# Patient Record
Sex: Female | Born: 1945 | Race: White | Hispanic: No | Marital: Married | Smoking: Never smoker
Health system: Southern US, Community
[De-identification: ages and names within clinical notes are randomized; demographics above are authoritative.]

## PROBLEM LIST (undated history)

## (undated) DIAGNOSIS — E785 Hyperlipidemia, unspecified: Secondary | ICD-10-CM

## (undated) DIAGNOSIS — H269 Unspecified cataract: Secondary | ICD-10-CM

## (undated) DIAGNOSIS — Z8719 Personal history of other diseases of the digestive system: Secondary | ICD-10-CM

## (undated) DIAGNOSIS — C801 Malignant (primary) neoplasm, unspecified: Secondary | ICD-10-CM

## (undated) DIAGNOSIS — K589 Irritable bowel syndrome without diarrhea: Secondary | ICD-10-CM

## (undated) DIAGNOSIS — K5790 Diverticulosis of intestine, part unspecified, without perforation or abscess without bleeding: Secondary | ICD-10-CM

## (undated) DIAGNOSIS — K5900 Constipation, unspecified: Secondary | ICD-10-CM

## (undated) DIAGNOSIS — I10 Essential (primary) hypertension: Secondary | ICD-10-CM

## (undated) DIAGNOSIS — K219 Gastro-esophageal reflux disease without esophagitis: Secondary | ICD-10-CM

## (undated) DIAGNOSIS — Z9289 Personal history of other medical treatment: Secondary | ICD-10-CM

## (undated) DIAGNOSIS — T7840XA Allergy, unspecified, initial encounter: Secondary | ICD-10-CM

## (undated) DIAGNOSIS — C4490 Unspecified malignant neoplasm of skin, unspecified: Secondary | ICD-10-CM

## (undated) DIAGNOSIS — M1990 Unspecified osteoarthritis, unspecified site: Secondary | ICD-10-CM

## (undated) DIAGNOSIS — E039 Hypothyroidism, unspecified: Secondary | ICD-10-CM

## (undated) DIAGNOSIS — R011 Cardiac murmur, unspecified: Secondary | ICD-10-CM

## (undated) DIAGNOSIS — N3010 Interstitial cystitis (chronic) without hematuria: Secondary | ICD-10-CM

## (undated) HISTORY — DX: Personal history of other diseases of the digestive system: Z87.19

## (undated) HISTORY — DX: Constipation, unspecified: K59.00

## (undated) HISTORY — PX: MOHS SURGERY: SUR867

## (undated) HISTORY — DX: Unspecified malignant neoplasm of skin, unspecified: C44.90

## (undated) HISTORY — DX: Unspecified osteoarthritis, unspecified site: M19.90

## (undated) HISTORY — DX: Malignant (primary) neoplasm, unspecified: C80.1

## (undated) HISTORY — DX: Interstitial cystitis (chronic) without hematuria: N30.10

## (undated) HISTORY — DX: Allergy, unspecified, initial encounter: T78.40XA

## (undated) HISTORY — DX: Hypothyroidism, unspecified: E03.9

## (undated) HISTORY — DX: Essential (primary) hypertension: I10

## (undated) HISTORY — DX: Gastro-esophageal reflux disease without esophagitis: K21.9

## (undated) HISTORY — DX: Diverticulosis of intestine, part unspecified, without perforation or abscess without bleeding: K57.90

## (undated) HISTORY — DX: Irritable bowel syndrome, unspecified: K58.9

## (undated) HISTORY — DX: Hyperlipidemia, unspecified: E78.5

## (undated) HISTORY — PX: JOINT REPLACEMENT: SHX530

## (undated) HISTORY — DX: Personal history of other medical treatment: Z92.89

## (undated) HISTORY — PX: ABDOMINAL HYSTERECTOMY: SHX81

## (undated) HISTORY — DX: Unspecified cataract: H26.9

---
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
## 1988-08-02 HISTORY — PX: TOTAL ABDOMINAL HYSTERECTOMY W/ BILATERAL SALPINGOOPHORECTOMY: SHX83

## 2000-05-09 ENCOUNTER — Encounter: Admission: RE | Admit: 2000-05-09 | Discharge: 2000-05-09 | Payer: Self-pay | Admitting: Family Medicine

## 2000-05-09 ENCOUNTER — Encounter: Payer: Self-pay | Admitting: Family Medicine

## 2000-08-17 ENCOUNTER — Other Ambulatory Visit: Admission: RE | Admit: 2000-08-17 | Discharge: 2000-08-17 | Payer: Self-pay | Admitting: Family Medicine

## 2001-05-16 ENCOUNTER — Encounter: Admission: RE | Admit: 2001-05-16 | Discharge: 2001-05-16 | Payer: Self-pay | Admitting: Family Medicine

## 2001-05-16 ENCOUNTER — Encounter: Payer: Self-pay | Admitting: Family Medicine

## 2002-05-21 ENCOUNTER — Encounter: Payer: Self-pay | Admitting: Family Medicine

## 2002-05-21 ENCOUNTER — Encounter: Admission: RE | Admit: 2002-05-21 | Discharge: 2002-05-21 | Payer: Self-pay | Admitting: Family Medicine

## 2003-02-08 ENCOUNTER — Other Ambulatory Visit: Admission: RE | Admit: 2003-02-08 | Discharge: 2003-02-08 | Payer: Self-pay | Admitting: Family Medicine

## 2003-05-28 ENCOUNTER — Encounter: Admission: RE | Admit: 2003-05-28 | Discharge: 2003-05-28 | Payer: Self-pay | Admitting: Family Medicine

## 2003-08-03 HISTORY — PX: MOHS SURGERY: SUR867

## 2004-05-28 ENCOUNTER — Encounter: Admission: RE | Admit: 2004-05-28 | Discharge: 2004-05-28 | Payer: Self-pay | Admitting: Family Medicine

## 2005-07-05 ENCOUNTER — Encounter: Admission: RE | Admit: 2005-07-05 | Discharge: 2005-07-05 | Payer: Self-pay | Admitting: Family Medicine

## 2006-07-07 ENCOUNTER — Encounter: Admission: RE | Admit: 2006-07-07 | Discharge: 2006-07-07 | Payer: Self-pay | Admitting: Family Medicine

## 2006-08-02 DIAGNOSIS — Z8711 Personal history of peptic ulcer disease: Secondary | ICD-10-CM

## 2006-08-02 HISTORY — DX: Personal history of peptic ulcer disease: Z87.11

## 2007-07-10 ENCOUNTER — Encounter: Admission: RE | Admit: 2007-07-10 | Discharge: 2007-07-10 | Payer: Self-pay | Admitting: Family Medicine

## 2008-05-25 ENCOUNTER — Emergency Department (HOSPITAL_COMMUNITY): Admission: EM | Admit: 2008-05-25 | Discharge: 2008-05-25 | Payer: Self-pay | Admitting: Emergency Medicine

## 2008-08-06 ENCOUNTER — Encounter: Admission: RE | Admit: 2008-08-06 | Discharge: 2008-11-04 | Payer: Self-pay | Admitting: Orthopedic Surgery

## 2008-08-14 ENCOUNTER — Encounter: Admission: RE | Admit: 2008-08-14 | Discharge: 2008-08-14 | Payer: Self-pay | Admitting: Family Medicine

## 2009-01-30 DIAGNOSIS — Z9289 Personal history of other medical treatment: Secondary | ICD-10-CM

## 2009-01-30 HISTORY — DX: Personal history of other medical treatment: Z92.89

## 2009-08-20 ENCOUNTER — Encounter: Admission: RE | Admit: 2009-08-20 | Discharge: 2009-08-20 | Payer: Self-pay | Admitting: Family Medicine

## 2010-08-22 ENCOUNTER — Other Ambulatory Visit: Payer: Self-pay | Admitting: Family Medicine

## 2010-08-22 DIAGNOSIS — Z0000 Encounter for general adult medical examination without abnormal findings: Secondary | ICD-10-CM

## 2010-09-02 ENCOUNTER — Ambulatory Visit
Admission: RE | Admit: 2010-09-02 | Discharge: 2010-09-02 | Disposition: A | Discharge status: Home / Self Care | Payer: BC Managed Care – PPO | Source: Ambulatory Visit | Attending: Family Medicine | Admitting: Family Medicine

## 2010-09-02 DIAGNOSIS — Z0000 Encounter for general adult medical examination without abnormal findings: Secondary | ICD-10-CM

## 2010-12-02 ENCOUNTER — Encounter: Payer: Self-pay | Admitting: Family Medicine

## 2010-12-09 ENCOUNTER — Encounter: Payer: Self-pay | Admitting: Gastroenterology

## 2011-08-10 ENCOUNTER — Other Ambulatory Visit: Payer: Self-pay | Admitting: Family Medicine

## 2011-08-10 DIAGNOSIS — Z1231 Encounter for screening mammogram for malignant neoplasm of breast: Secondary | ICD-10-CM

## 2011-09-15 ENCOUNTER — Ambulatory Visit
Admission: RE | Admit: 2011-09-15 | Discharge: 2011-09-15 | Disposition: A | Discharge status: Home / Self Care | Payer: BC Managed Care – PPO | Source: Ambulatory Visit | Attending: Family Medicine | Admitting: Family Medicine

## 2011-09-15 DIAGNOSIS — Z1231 Encounter for screening mammogram for malignant neoplasm of breast: Secondary | ICD-10-CM

## 2012-06-14 ENCOUNTER — Other Ambulatory Visit: Payer: Self-pay | Admitting: Dermatology

## 2012-08-21 ENCOUNTER — Other Ambulatory Visit: Payer: Self-pay | Admitting: Family Medicine

## 2012-08-21 DIAGNOSIS — Z1231 Encounter for screening mammogram for malignant neoplasm of breast: Secondary | ICD-10-CM

## 2012-09-28 ENCOUNTER — Ambulatory Visit
Admission: RE | Admit: 2012-09-28 | Discharge: 2012-09-28 | Disposition: A | Discharge status: Home / Self Care | Payer: Medicare Other | Source: Ambulatory Visit | Attending: Family Medicine | Admitting: Family Medicine

## 2012-10-04 ENCOUNTER — Other Ambulatory Visit: Payer: Self-pay | Admitting: Dermatology

## 2012-11-07 ENCOUNTER — Other Ambulatory Visit (INDEPENDENT_AMBULATORY_CARE_PROVIDER_SITE_OTHER): Payer: Medicare Other

## 2012-11-07 DIAGNOSIS — Z125 Encounter for screening for malignant neoplasm of prostate: Secondary | ICD-10-CM

## 2012-11-07 DIAGNOSIS — R5381 Other malaise: Secondary | ICD-10-CM

## 2012-11-07 DIAGNOSIS — R5383 Other fatigue: Secondary | ICD-10-CM

## 2012-11-07 DIAGNOSIS — I10 Essential (primary) hypertension: Secondary | ICD-10-CM

## 2012-11-07 DIAGNOSIS — E785 Hyperlipidemia, unspecified: Secondary | ICD-10-CM

## 2012-11-07 DIAGNOSIS — E559 Vitamin D deficiency, unspecified: Secondary | ICD-10-CM

## 2012-11-07 LAB — BASIC METABOLIC PANEL
BUN: 13 mg/dL (ref 6–23)
Calcium: 10.1 mg/dL (ref 8.4–10.5)
Creat: 0.75 mg/dL (ref 0.50–1.10)
Glucose, Bld: 88 mg/dL (ref 70–99)

## 2012-11-07 LAB — HEPATIC FUNCTION PANEL
ALT: 28 U/L (ref 0–35)
AST: 25 U/L (ref 0–37)
Albumin: 4.9 g/dL (ref 3.5–5.2)
Alkaline Phosphatase: 55 U/L (ref 39–117)
Bilirubin, Direct: 0.1 mg/dL (ref 0.0–0.3)
Total Bilirubin: 0.5 mg/dL (ref 0.3–1.2)
Total Protein: 7.5 g/dL (ref 6.0–8.3)

## 2012-11-07 NOTE — Progress Notes (Signed)
Pt came in for labs only 

## 2012-11-08 LAB — NMR LIPOPROFILE WITH LIPIDS
Cholesterol, Total: 181 mg/dL (ref <200)
LDL (calc): 86 mg/dL (ref <100)
LDL Size: 20.4 nm — ABNORMAL LOW (ref >20.5)
LP-IR Score: 37 (ref <=45)
Large HDL-P: 5.6 umol/L (ref >=4.8)
Large VLDL-P: 1.2 nmol/L (ref <=2.7)
Small LDL Particle Number: 657 nmol/L — ABNORMAL HIGH (ref <=527)
VLDL Size: 40.1 nm (ref <=46.6)

## 2012-11-08 NOTE — Progress Notes (Signed)
LMOM

## 2012-12-11 ENCOUNTER — Ambulatory Visit (INDEPENDENT_AMBULATORY_CARE_PROVIDER_SITE_OTHER): Payer: Medicare Other | Admitting: Family Medicine

## 2012-12-11 ENCOUNTER — Encounter: Payer: Self-pay | Admitting: Family Medicine

## 2012-12-11 VITALS — BP 113/66 | HR 77 | Temp 97.7°F | Ht 62.25 in | Wt 137.0 lb

## 2012-12-11 DIAGNOSIS — K219 Gastro-esophageal reflux disease without esophagitis: Secondary | ICD-10-CM | POA: Insufficient documentation

## 2012-12-11 DIAGNOSIS — M949 Disorder of cartilage, unspecified: Secondary | ICD-10-CM

## 2012-12-11 DIAGNOSIS — E785 Hyperlipidemia, unspecified: Secondary | ICD-10-CM | POA: Insufficient documentation

## 2012-12-11 DIAGNOSIS — J309 Allergic rhinitis, unspecified: Secondary | ICD-10-CM

## 2012-12-11 DIAGNOSIS — E039 Hypothyroidism, unspecified: Secondary | ICD-10-CM | POA: Insufficient documentation

## 2012-12-11 DIAGNOSIS — M8580 Other specified disorders of bone density and structure, unspecified site: Secondary | ICD-10-CM

## 2012-12-11 DIAGNOSIS — I10 Essential (primary) hypertension: Secondary | ICD-10-CM | POA: Insufficient documentation

## 2012-12-11 NOTE — Progress Notes (Signed)
  Subjective:    Patient ID: Marissa Perez, female    DOB: 11-18-1945, 67 y.o.   MRN: [id number]  HPI This patient presents for recheck of multiple medical problems. No one accompanies the patient today.  There are no active problems to display for this patient.  this information will be populated today  In addition, .See ros  The allergies, current medications, past medical history, surgical history, family and social history are reviewed.  Immunizations reviewed.  Health maintenance reviewed.  The following items are outstanding:None      Review of Systems  Constitutional: Negative.   HENT: Positive for sneezing and postnasal drip (due to allergies).   Eyes: Positive for itching (due to allersies).  Respiratory: Negative.   Cardiovascular: Negative.   Gastrointestinal: Negative.   Endocrine: Negative.   Genitourinary: Negative.   Musculoskeletal: Negative.   Allergic/Immunologic: Positive for environmental allergies (seasonal).  Neurological: Negative.   Hematological: Negative.   Psychiatric/Behavioral: Negative.        Objective:   Physical Exam BP 113/66  Pulse 77  Temp(Src) 97.7 F (36.5 C) (Oral)  Ht 5' 2.25" (1.581 m)  Wt 137 lb (62.143 kg)  BMI 24.86 kg/m2  The patient appeared well nourished and normally developed, alert and oriented to time and place. Speech, behavior and judgement appear normal. Vital signs as documented.  Head exam is unremarkable. No scleral icterus or pallor noted. Nasal congestion bilateral. Neck is without jugular venous distension, thyromegally, or carotid bruits. Carotid upstrokes are brisk bilaterally. No cervical adenopathy. Lungs are clear anteriorly and posteriorly to auscultation. Normal respiratory effort. Cardiac exam reveals regular rate and rhythm at 60 per minute. First and second heart sounds normal.  No murmurs, rubs or gallops.  Abdominal exam reveals normal bowl sounds, no masses, no organomegaly and no aortic  enlargement. No inguinal adenopathy. Extremities are nonedematous and both femoral and pedal pulses are normal. Skin without pallor or jaundice.  Warm and dry, without rash. Neurologic exam reveals normal deep tendon reflexes and normal sensation.          Assessment & Plan:   1. Allergic rhinitis  2. Hypothyroidism  3. Hyperlipidemia  4. Hypertension  5. GERD (gastroesophageal reflux disease)   Patient Instructions  Continue current meds and therapeutic lifestyle changes Consider Nasacort AQ over-the-counter 1-2 sprays each nostril at bedtime You should really start this  mid February of every year Can continue Claritin over-the-counter one daily    If Nasacort is more expensive, call my nurse and we will call in fluticasone to the drugstore. DEXA scan is due late August or September

## 2012-12-11 NOTE — Patient Instructions (Addendum)
Continue current meds and therapeutic lifestyle changes Consider Nasacort AQ over-the-counter 1-2 sprays each nostril at bedtime You should really start this  mid February of every year Can continue Claritin over-the-counter one daily

## 2013-01-18 ENCOUNTER — Ambulatory Visit (INDEPENDENT_AMBULATORY_CARE_PROVIDER_SITE_OTHER): Payer: Medicare Other

## 2013-01-18 ENCOUNTER — Ambulatory Visit (INDEPENDENT_AMBULATORY_CARE_PROVIDER_SITE_OTHER): Payer: Medicare Other | Admitting: Family Medicine

## 2013-01-18 ENCOUNTER — Encounter: Payer: Self-pay | Admitting: Family Medicine

## 2013-01-18 VITALS — BP 120/69 | HR 82 | Temp 97.8°F | Ht 62.25 in | Wt 140.4 lb

## 2013-01-18 DIAGNOSIS — M79609 Pain in unspecified limb: Secondary | ICD-10-CM

## 2013-01-18 DIAGNOSIS — M722 Plantar fascial fibromatosis: Secondary | ICD-10-CM

## 2013-01-18 DIAGNOSIS — M25571 Pain in right ankle and joints of right foot: Secondary | ICD-10-CM

## 2013-01-18 DIAGNOSIS — M25579 Pain in unspecified ankle and joints of unspecified foot: Secondary | ICD-10-CM

## 2013-01-18 DIAGNOSIS — M79671 Pain in right foot: Secondary | ICD-10-CM

## 2013-01-18 MED ORDER — CVS GEL HEEL CUSHION WOMENS PADS: 1.0000 | MEDICATED_PAD | Freq: Every day | Status: DC

## 2013-01-18 MED ORDER — MELOXICAM 15 MG PO TABS: 15.0000 mg | ORAL_TABLET | Freq: Every day | ORAL | Status: DC

## 2013-01-18 NOTE — Progress Notes (Signed)
  Subjective:    Patient ID: Marissa Perez, female    DOB: 1945-12-08, 67 y.o.   MRN: [id number]  HPI  Right ankle pain x2 weeks. Patient reports walking in the yard and mildly rolling her right ankle. Baseline history of plantar fasciitis. Has also had heel pain it feels like her plantar fasciitis is also been exacerbated by the ankle sprain. No distal numbness or paresthesias. Has been using high-dose ibuprofen with minimal improvement in symptoms.   Review of Systems  All other systems reviewed and are negative.       Objective:   Physical Exam  Constitutional: She appears well-developed and well-nourished.  HENT:  Head: Normocephalic and atraumatic.  Eyes: Conjunctivae are normal. Pupils are equal, round, and reactive to light.  Neck: Normal range of motion.  Cardiovascular: Normal rate and regular rhythm.   Pulmonary/Chest: Effort normal.  Abdominal: Soft.  Musculoskeletal:       Feet:  Positive tenderness palpation of mild swelling of the affected area. Mild heel and mid plantar foot tenderness palpation.  Neurological: She is alert.  Skin: Skin is warm.   [facility] reading (PRIMARY) by  Dr. Alvester Morin Right ankle x-ray preliminarily negative for any acute fracture or dislocation.                                         Assessment & Plan:  Pain in joint, ankle and foot, right - Plan: DG Ankle Complete Right, DG Foot Complete Right, meloxicam (MOBIC) 15 MG tablet  Foot pain, right - Plan: DG Ankle Complete Right, DG Foot Complete Right  Plantar fasciitis of right foot - Plan: Foot Care Products ([employer] GEL HEEL CUSHION WOMENS) PADS  X-rays preliminarily negative for any acute fracture dislocation. RICE and NSAIDs  Ankle brace  Heel pad for plantar fasciitis Discussed MSK red flags.  Follow up as needed.      The patient and/or caregiver has been counseled thoroughly with regard to treatment plan and/or medications prescribed including dosage, schedule,  interactions, rationale for use, and possible side effects and they verbalize understanding. Diagnoses and expected course of recovery discussed and will return if not improved as expected or if the condition worsens. Patient and/or caregiver verbalized understanding.

## 2013-01-19 ENCOUNTER — Encounter: Payer: Self-pay | Admitting: Family Medicine

## 2013-01-31 ENCOUNTER — Ambulatory Visit (INDEPENDENT_AMBULATORY_CARE_PROVIDER_SITE_OTHER): Payer: Medicare Other | Admitting: Family Medicine

## 2013-01-31 ENCOUNTER — Encounter: Payer: Self-pay | Admitting: Family Medicine

## 2013-01-31 VITALS — BP 151/82 | HR 76 | Temp 97.5°F | Wt 140.4 lb

## 2013-01-31 DIAGNOSIS — M25571 Pain in right ankle and joints of right foot: Secondary | ICD-10-CM

## 2013-01-31 DIAGNOSIS — M25579 Pain in unspecified ankle and joints of unspecified foot: Secondary | ICD-10-CM

## 2013-01-31 MED ORDER — METHYLPREDNISOLONE 4 MG PO KIT: PACK | ORAL | Status: DC

## 2013-01-31 MED ORDER — VALSARTAN 160 MG PO TABS: 80.0000 mg | ORAL_TABLET | Freq: Every day | ORAL | Status: DC

## 2013-01-31 MED ORDER — EZETIMIBE 10 MG PO TABS: 10.0000 mg | ORAL_TABLET | Freq: Every day | ORAL | Status: DC

## 2013-01-31 MED ORDER — ATORVASTATIN CALCIUM 10 MG PO TABS: 10.0000 mg | ORAL_TABLET | Freq: Every day | ORAL | Status: DC

## 2013-01-31 MED ORDER — LEVOTHYROXINE SODIUM 50 MCG PO TABS: 50.0000 ug | ORAL_TABLET | ORAL | Status: DC

## 2013-01-31 MED ORDER — MELOXICAM 15 MG PO TABS
15.0000 mg | ORAL_TABLET | Freq: Every day | ORAL | Status: DC
Start: 2013-01-31 — End: 2013-04-16

## 2013-01-31 NOTE — Patient Instructions (Signed)

## 2013-01-31 NOTE — Progress Notes (Signed)
  Subjective:    Patient ID: Marissa Perez, female    DOB: [birth year], 67 y.o.   MRN: [id number]  HPI  This 67 y.o. female presents for evaluation of right heel discomfort and plantar fascitis.  She was placed in AFO boot for a couple weeks and has taken mobic daily and is improving some but still has right foot discomfort and right heel pain..  Review of Systems C/o right heel discomfort. No chest pain, SOB, HA, dizziness, vision change, N/V, diarrhea, constipation, dysuria, urinary urgency or frequency, or rash.     Objective:   Physical Exam Vital signs noted  Well developed well nourished female.  HEENT - Head atraumatic Normocephalic                Throat - oropharanx wnl Respiratory - Lungs CTA bilateral Cardiac - RRR S1 and S2 without murmur Extremities - Tenderness right heel and plantar area of right foot.       Assessment & Plan:  Pain in joint, ankle and foot, right - Plan: meloxicam (MOBIC) 15 MG tablet, Ambulatory referral to Physical Therapy, methylPREDNISolone (MEDROL, PAK,) 4 MG tablet.  Recommend she wear high top shoes to bed to splint feet and recommend stretches to right foot.  Follow up prn.

## 2013-02-20 ENCOUNTER — Ambulatory Visit: Payer: Medicare Other | Attending: Family Medicine | Admitting: Physical Therapy

## 2013-02-20 DIAGNOSIS — R269 Unspecified abnormalities of gait and mobility: Secondary | ICD-10-CM | POA: Insufficient documentation

## 2013-02-20 DIAGNOSIS — R5381 Other malaise: Secondary | ICD-10-CM | POA: Insufficient documentation

## 2013-02-20 DIAGNOSIS — M25579 Pain in unspecified ankle and joints of unspecified foot: Secondary | ICD-10-CM | POA: Insufficient documentation

## 2013-02-20 DIAGNOSIS — IMO0001 Reserved for inherently not codable concepts without codable children: Secondary | ICD-10-CM | POA: Insufficient documentation

## 2013-02-23 ENCOUNTER — Ambulatory Visit: Payer: Medicare Other | Admitting: Physical Therapy

## 2013-02-27 ENCOUNTER — Ambulatory Visit: Payer: Medicare Other | Admitting: Physical Therapy

## 2013-03-01 ENCOUNTER — Ambulatory Visit: Payer: Medicare Other | Admitting: Physical Therapy

## 2013-03-06 ENCOUNTER — Ambulatory Visit: Payer: Medicare Other | Attending: Family Medicine | Admitting: Physical Therapy

## 2013-03-06 DIAGNOSIS — R5381 Other malaise: Secondary | ICD-10-CM | POA: Insufficient documentation

## 2013-03-06 DIAGNOSIS — IMO0001 Reserved for inherently not codable concepts without codable children: Secondary | ICD-10-CM | POA: Insufficient documentation

## 2013-03-06 DIAGNOSIS — R269 Unspecified abnormalities of gait and mobility: Secondary | ICD-10-CM | POA: Insufficient documentation

## 2013-03-06 DIAGNOSIS — M25579 Pain in unspecified ankle and joints of unspecified foot: Secondary | ICD-10-CM | POA: Insufficient documentation

## 2013-03-08 ENCOUNTER — Ambulatory Visit: Payer: Medicare Other | Admitting: Physical Therapy

## 2013-03-13 ENCOUNTER — Ambulatory Visit: Payer: Medicare Other | Admitting: *Deleted

## 2013-03-15 ENCOUNTER — Ambulatory Visit: Payer: Medicare Other | Admitting: Physical Therapy

## 2013-03-19 ENCOUNTER — Ambulatory Visit: Payer: Medicare Other | Admitting: Physical Therapy

## 2013-03-26 ENCOUNTER — Telehealth: Payer: Self-pay | Admitting: Family Medicine

## 2013-03-27 ENCOUNTER — Ambulatory Visit: Payer: Medicare Other | Admitting: Physical Therapy

## 2013-03-29 ENCOUNTER — Ambulatory Visit (INDEPENDENT_AMBULATORY_CARE_PROVIDER_SITE_OTHER): Payer: Medicare Other | Admitting: Family Medicine

## 2013-03-29 ENCOUNTER — Encounter: Payer: Self-pay | Admitting: Family Medicine

## 2013-03-29 ENCOUNTER — Ambulatory Visit: Payer: Medicare Other | Admitting: Physical Therapy

## 2013-03-29 VITALS — BP 156/80 | HR 67 | Temp 98.0°F | Resp 20 | Ht 63.0 in | Wt 141.6 lb

## 2013-03-29 DIAGNOSIS — M25571 Pain in right ankle and joints of right foot: Secondary | ICD-10-CM

## 2013-03-29 DIAGNOSIS — M25579 Pain in unspecified ankle and joints of unspecified foot: Secondary | ICD-10-CM

## 2013-03-29 DIAGNOSIS — M722 Plantar fascial fibromatosis: Secondary | ICD-10-CM

## 2013-03-29 NOTE — Progress Notes (Signed)
Subjective:     Patient ID: Marissa Perez, female   DOB: [birth year], 67 y.o.   MRN: [id number]  HPI Patient comes in today for followup of right foot pain. She had multiple injuries back in June and has been hurting in several locations on the foot including posterior to the right lateral malleolus the plantar surface and the ball of the foot.  Review of Systems  Constitutional: Positive for activity change. Negative for fever, chills and appetite change.  HENT: Negative.  Negative for ear pain, congestion and sinus pressure.   Eyes: Negative.   Respiratory: Negative.   Cardiovascular: Negative.  Negative for chest pain and palpitations.  Gastrointestinal: Negative.   Endocrine: Negative.   Genitourinary: Negative.  Negative for urgency and hematuria.  Musculoskeletal: Positive for myalgias (right leg), joint swelling (right ankle and top of the right foot) and gait problem (difficult to walk due to right foot pain).  Skin: Negative.   Allergic/Immunologic: Negative.   Neurological: Negative.  Negative for dizziness, tremors, syncope, weakness, numbness and headaches.  Hematological: Does not bruise/bleed easily.  Psychiatric/Behavioral: Negative for confusion and sleep disturbance. The patient is not nervous/anxious.        Objective:   Physical Exam  Nursing note and vitals reviewed. Constitutional: She is oriented to person, place, and time. She appears well-developed and well-nourished. No distress.  Musculoskeletal: Normal range of motion. She exhibits tenderness (tenderness posterior to right lateral malleolus. Tenderness of the dorsal foot and plantar foot. Tenderness on the extensor tendons dorsal foot). She exhibits no edema.  Pain in foot with walking and especially dorsiflexion of foot  Neurological: She is alert and oriented to person, place, and time.  Skin: Skin is warm and dry. No rash noted. No erythema.  Psychiatric: She has a normal mood and affect. Her behavior is  normal. Judgment and thought content normal.       Assessment:     Pain in joint, ankle and foot, right  Plantar fasciitis of right foot  Patient Instructions  Continue with warm wet compresses and elevation Try to reduce meloxicam to 7.5 mg, 1 daily after eating Wear good shoes with firm support Call back in 5-6 days as we arrange an appointment with the orthopedist for further followup Will most likely need an MRI to see the details of the soft tissue and to make sure there is no stress fracture        Nyra Capes MD

## 2013-03-29 NOTE — Patient Instructions (Signed)
Continue with warm wet compresses and elevation Try to reduce meloxicam to 7.5 mg, 1 daily after eating Wear good shoes with firm support Call back in 5-6 days as we arrange an appointment with the orthopedist for further followup Will most likely need an MRI to see the details of the soft tissue and to make sure there is no stress fracture

## 2013-03-29 NOTE — Addendum Note (Signed)
Addended by: Bearl Mulberry on: 03/29/2013 01:55 PM   Modules accepted: Orders

## 2013-04-04 ENCOUNTER — Ambulatory Visit (INDEPENDENT_AMBULATORY_CARE_PROVIDER_SITE_OTHER): Payer: Medicare Other

## 2013-04-04 ENCOUNTER — Ambulatory Visit (INDEPENDENT_AMBULATORY_CARE_PROVIDER_SITE_OTHER): Payer: Medicare Other | Admitting: Pharmacist

## 2013-04-04 VITALS — Ht 63.0 in | Wt 143.0 lb

## 2013-04-04 DIAGNOSIS — M899 Disorder of bone, unspecified: Secondary | ICD-10-CM

## 2013-04-04 DIAGNOSIS — M8580 Other specified disorders of bone density and structure, unspecified site: Secondary | ICD-10-CM

## 2013-04-04 DIAGNOSIS — E039 Hypothyroidism, unspecified: Secondary | ICD-10-CM

## 2013-04-04 DIAGNOSIS — Z13820 Encounter for screening for osteoporosis: Secondary | ICD-10-CM

## 2013-04-04 MED ORDER — CALCIUM CITRATE-VITAMIN D 315-250 MG-UNIT PO TABS: 1.0000 | ORAL_TABLET | Freq: Two times a day (BID) | ORAL | Status: DC

## 2013-04-04 NOTE — Patient Instructions (Signed)

## 2013-04-04 NOTE — Progress Notes (Signed)
Patient ID: Marissa Perez, female   DOB: 09/28/1945, 67 y.o.   MRN: [id number] [facility] Current Height: Height: 5\' 3"  (160 cm)      Max Lifetime Height:  5\' 3"  Current Weight: Weight: 143 lb (64.864 kg)         HPI: Does pt already have a diagnosis of:  Osteopenia?  No Osteoporosis?  No  Back Pain?  No       Kyphosis?  No Prior fracture?  Yes - shoulder twice Med(s) for Osteoporosis/Osteopenia:  Calcium bid Med(s) previously tried for Osteoporosis/Osteopenia:  none                                                             PMH: Age at menopause:  Early 91's Hysterectomy?  Yes Oophorectomy?  Yes HRT? Yes - Former.  Type/duration: estrogen - 10 years Steroid Use?  No Thyroid med?  Yes History of cancer?  No History of digestive disorders (ie Crohn's)?  Yes - on chronic PPI Current or previous eating disorders?  No Last Vitamin D Result:  61 (10/2012)    FH/SH: Family history of osteoporosis?  No Parent with history of hip fracture?  No Family history of breast cancer?  Yes - mother, maternal aunt, paternal grandmother and paternal aunt Exercise?  No Smoking?  No Alcohol?  No    Calcium Assessment Calcium Intake  # of servings/day  Calcium mg  Milk (8 oz) 1  x  300  = 300mg   Yogurt (4 oz) 0 x  200 = 0  Cheese (1 oz) 0 x  200 = 0  Other Calcium sources   250mg   Ca supplement MVI daily and caltrate 600mg  bid* = 1600mg    Estimated calcium intake per day 2150mg     DEXA Results Date of Test T-Score for AP Spine L1-L4 T-Score for Total Left Hip T-Score for Total Right Hip  04/04/2013 3.1 1.1 0.6  08/15/20012 2.9 1.0 0.4  09/18/2008 2.4 0.7 0.2  09/20/2005 2.2 0.9 0.3   Assessment: Normal BMD with high calcium intake Patient c/o splitting nails - possibly due hypothyroidism or B12 deficiency.   Recommendations: 1.  Discussed DEXA results and fracture risk 2.  recommend calcium 1200mg  daily through supplementation or diet. Suggested change to calcium  citrate since taking daily PPI. 3.  recommend weight bearing exercise - 30 minutes at least 4 days per week (once foot problems resolved and cleared to exercise again. 4.  Counseled and educated about fall risk and prevention. 5. Patient to have labs drawn next week - will check thyroid panel and B12 level  Recheck DEXA:  3 years  Time spent counseling patient:  20 minutes

## 2013-04-09 ENCOUNTER — Other Ambulatory Visit (INDEPENDENT_AMBULATORY_CARE_PROVIDER_SITE_OTHER): Payer: Medicare Other

## 2013-04-09 DIAGNOSIS — E039 Hypothyroidism, unspecified: Secondary | ICD-10-CM

## 2013-04-09 NOTE — Progress Notes (Signed)
Pt came in for labs only 

## 2013-04-10 LAB — THYROID PANEL WITH TSH: T3 Uptake Ratio: 33 % (ref 24–39)

## 2013-04-11 ENCOUNTER — Encounter: Payer: Self-pay | Admitting: Pharmacist

## 2013-04-11 ENCOUNTER — Telehealth: Payer: Self-pay | Admitting: Family Medicine

## 2013-04-12 NOTE — Telephone Encounter (Signed)
Pt called about labs- will draw on monday

## 2013-04-16 ENCOUNTER — Encounter: Payer: Self-pay | Admitting: Family Medicine

## 2013-04-16 ENCOUNTER — Ambulatory Visit (INDEPENDENT_AMBULATORY_CARE_PROVIDER_SITE_OTHER): Payer: Medicare Other | Admitting: Family Medicine

## 2013-04-16 VITALS — BP 121/72 | HR 69 | Temp 98.0°F | Ht 63.0 in | Wt 141.4 lb

## 2013-04-16 DIAGNOSIS — I10 Essential (primary) hypertension: Secondary | ICD-10-CM

## 2013-04-16 DIAGNOSIS — E039 Hypothyroidism, unspecified: Secondary | ICD-10-CM

## 2013-04-16 DIAGNOSIS — R5381 Other malaise: Secondary | ICD-10-CM

## 2013-04-16 DIAGNOSIS — E785 Hyperlipidemia, unspecified: Secondary | ICD-10-CM

## 2013-04-16 DIAGNOSIS — E559 Vitamin D deficiency, unspecified: Secondary | ICD-10-CM

## 2013-04-16 DIAGNOSIS — K219 Gastro-esophageal reflux disease without esophagitis: Secondary | ICD-10-CM

## 2013-04-16 DIAGNOSIS — M722 Plantar fascial fibromatosis: Secondary | ICD-10-CM

## 2013-04-16 LAB — POCT CBC
Granulocyte percent: 57.2 %G (ref 37–80)
HCT, POC: 37.5 % — AB (ref 37.7–47.9)
POC Granulocyte: 2.7 (ref 2–6.9)
POC LYMPH PERCENT: 39.7 %L (ref 10–50)
Platelet Count, POC: 303 10*3/uL (ref 142–424)
RDW, POC: 12.6 %

## 2013-04-16 NOTE — Progress Notes (Signed)
  Subjective:    Patient ID: Marissa Perez, female    DOB: Sep 02, 1945, 67 y.o.   MRN: [id number]  HPI Patient returns to clinic today for followup and management of chronic medical problems. These include hypertension elevated cholesterol, hypothyroidism GERD, and vitamin D deficiency. She is also having ongoing problems with her plantar fasciitis and does not have an appointment scheduled with orthopedics until October. Lab work will be reviewed with patient.   Review of Systems  Constitutional: Positive for fatigue (slight).  HENT: Positive for sore throat (scratchy due to allergies), sneezing and postnasal drip. Negative for ear pain, congestion and sinus pressure.   Eyes: Negative.  Negative for pain, redness, itching and visual disturbance.  Respiratory: Positive for cough (dry, due to allergies). Negative for chest tightness, shortness of breath and wheezing.   Cardiovascular: Positive for leg swelling.  Gastrointestinal: Negative.  Negative for nausea, abdominal pain, diarrhea and constipation.  Endocrine: Negative.  Negative for cold intolerance, heat intolerance, polydipsia and polyphagia.  Genitourinary: Negative.  Negative for dysuria, frequency, hematuria, vaginal bleeding and vaginal discharge.  Musculoskeletal: Negative.  Negative for myalgias, back pain and arthralgias.  Skin: Negative.  Negative for color change, rash and wound.  Allergic/Immunologic: Positive for environmental allergies (seasonal).  Neurological: Negative.  Negative for dizziness, tremors, weakness and headaches.  Psychiatric/Behavioral: Negative for confusion, sleep disturbance and agitation. The patient is not nervous/anxious.        Objective:   Physical Exam BP 121/72  Pulse 69  Temp(Src) 98 F (36.7 C) (Oral)  Ht 5\' 3"  (1.6 m)  Wt 141 lb 6.4 oz (64.139 kg)  BMI 25.05 kg/m2  The patient appeared well nourished and normally developed, alert and oriented to time and place. Speech, behavior and  judgement appear normal. Vital signs as documented.  Head exam is unremarkable. No scleral icterus or pallor noted. There was nasal congestion and edema bilaterally. Throat and mouth were normal. Ear canals were clear. TMs were normal Neck is without jugular venous distension, thyromegally, or carotid bruits. Carotid upstrokes are brisk bilaterally. No cervical adenopathy. Lungs are clear anteriorly and posteriorly to auscultation. Normal respiratory effort. Cardiac exam reveals regular rate and rhythm at 60 per minute. First and second heart sounds normal.  No murmurs, rubs or gallops.  Abdominal exam reveals normal bowl sounds, no masses, no organomegaly and no aortic enlargement. No inguinal adenopathy. Extremities are nonedematous and both femoral and pedal pulses are normal. The right foot was palpated and I was unable to elicit a lot of tenderness with the exam . Skin without pallor or jaundice.  Warm and dry, without rash. There were a couple skin lesions on the back for which she will followup with the dermatologist. Neurologic exam reveals normal deep tendon reflexes and normal sensation.           Assessment & Plan:  Other malaise and fatigue - Plan: POCT CBC  Essential hypertension, benign - Plan: BMP8+EGFR  Other and unspecified hyperlipidemia - Plan: Hepatic function panel, NMR, lipoprofile  Hypothyroidism  GERD (gastroesophageal reflux disease)  Vitamin D deficiency - Plan: Vit D  25 hydroxy (rtn osteoporosis monitoring)  Plantar fasciitis of right foot  Patient Instructions  Fall precautions discussed Continue current meds and therapeutic lifestyle changes Return to clinic in October for a flu shot    Nyra Capes MD

## 2013-04-16 NOTE — Patient Instructions (Addendum)
Fall precautions discussed Continue current meds and therapeutic lifestyle changes Return to clinic in October for a flu shot

## 2013-04-17 ENCOUNTER — Telehealth: Payer: Self-pay | Admitting: Family Medicine

## 2013-04-18 LAB — HEPATIC FUNCTION PANEL
Albumin: 5 g/dL — ABNORMAL HIGH (ref 3.6–4.8)
Total Protein: 7 g/dL (ref 6.0–8.5)

## 2013-04-18 LAB — BMP8+EGFR
BUN: 14 mg/dL (ref 8–27)
CO2: 26 mmol/L (ref 18–29)
Calcium: 9.8 mg/dL (ref 8.6–10.2)
Chloride: 97 mmol/L (ref 97–108)
Glucose: 88 mg/dL (ref 65–99)

## 2013-04-18 LAB — NMR, LIPOPROFILE
Cholesterol: 178 mg/dL (ref <200)
HDL Cholesterol by NMR: 69 mg/dL (ref >=40)
HDL Particle Number: 54.3 umol/L (ref >=30.5)
LDLC SERPL CALC-MCNC: 72 mg/dL (ref <100)

## 2013-04-18 NOTE — Telephone Encounter (Signed)
Left message on am with appointment date/time

## 2013-04-30 ENCOUNTER — Telehealth: Payer: Self-pay | Admitting: *Deleted

## 2013-04-30 NOTE — Telephone Encounter (Signed)
Message copied by Baltazar Apo on Mon Apr 30, 2013  9:41 AM ------      Message from: Ernestina Penna      Created: Wed Apr 18, 2013  7:52 AM       Blood sugar renal and electrolytes are all within normal limit      Liver function tests are within normal limits, the albumin is slightly elevated but this is okay      Advanced lipid testing, a total LDL [place] number is elevated at 1340, previously it was 1251. Triglycerides are elevated at 185 this number should be less than 100. She is unable to walk because of foot problems, we must get this corrected as soon as possible so she can begin to exercise again. Increase atorvastatin to 20 mg daily--------- call in new prescription if necessary, recheck liver function tests in 4 week+++++++++++++ ------

## 2013-05-21 ENCOUNTER — Ambulatory Visit (INDEPENDENT_AMBULATORY_CARE_PROVIDER_SITE_OTHER): Payer: Medicare Other

## 2013-05-21 DIAGNOSIS — Z23 Encounter for immunization: Secondary | ICD-10-CM

## 2013-06-01 ENCOUNTER — Telehealth: Payer: Self-pay | Admitting: Family Medicine

## 2013-06-04 NOTE — Telephone Encounter (Signed)
Spoke with pt and she will return for labs

## 2013-06-06 ENCOUNTER — Other Ambulatory Visit (INDEPENDENT_AMBULATORY_CARE_PROVIDER_SITE_OTHER): Payer: Medicare Other

## 2013-06-06 DIAGNOSIS — Z09 Encounter for follow-up examination after completed treatment for conditions other than malignant neoplasm: Secondary | ICD-10-CM

## 2013-06-06 NOTE — Progress Notes (Signed)
Pt came in for labs only 

## 2013-06-07 LAB — HEPATIC FUNCTION PANEL
AST: 32 IU/L (ref 0–40)
Total Bilirubin: 0.4 mg/dL (ref 0.0–1.2)

## 2013-06-11 ENCOUNTER — Telehealth: Payer: Self-pay | Admitting: Family Medicine

## 2013-06-11 NOTE — Telephone Encounter (Signed)
Pt aware of this. 

## 2013-06-11 NOTE — Telephone Encounter (Signed)
The liver function tests were slightly elevated but not significantly she should continue with Lipitor 20  . This tests were just done to make sure liver function tests were stable and they are stable enough to not go back to the 10 mg

## 2013-06-11 NOTE — Telephone Encounter (Signed)
WHEN YOU REVIEW LABS PT NEEDS TO KNOW WHAT DOSE OF LIPITOR TAKE? SHE IS ON 10MG  AND YOU DISCUSSED 20MG  IF LFT'S WERE OKAY. THANKS.

## 2013-07-11 ENCOUNTER — Other Ambulatory Visit: Payer: Self-pay | Admitting: Family Medicine

## 2013-07-13 ENCOUNTER — Encounter: Payer: Self-pay | Admitting: Family Medicine

## 2013-07-13 NOTE — Telephone Encounter (Signed)
Refill sent to pharmacy this morning.

## 2013-07-18 ENCOUNTER — Encounter: Payer: Self-pay | Admitting: Family Medicine

## 2013-07-19 ENCOUNTER — Other Ambulatory Visit: Payer: Self-pay | Admitting: Nurse Practitioner

## 2013-07-19 MED ORDER — OSELTAMIVIR PHOSPHATE 75 MG PO CAPS: 75.0000 mg | ORAL_CAPSULE | Freq: Every day | ORAL | Status: DC

## 2013-07-23 ENCOUNTER — Other Ambulatory Visit: Payer: Self-pay | Admitting: Family Medicine

## 2013-08-06 ENCOUNTER — Other Ambulatory Visit: Payer: Self-pay | Admitting: Family Medicine

## 2013-08-07 ENCOUNTER — Other Ambulatory Visit: Payer: Self-pay | Admitting: Family Medicine

## 2013-08-13 ENCOUNTER — Encounter: Payer: Self-pay | Admitting: Gastroenterology

## 2013-08-21 ENCOUNTER — Other Ambulatory Visit (INDEPENDENT_AMBULATORY_CARE_PROVIDER_SITE_OTHER): Payer: Medicare Other

## 2013-08-21 DIAGNOSIS — E039 Hypothyroidism, unspecified: Secondary | ICD-10-CM

## 2013-08-21 DIAGNOSIS — E559 Vitamin D deficiency, unspecified: Secondary | ICD-10-CM

## 2013-08-21 DIAGNOSIS — E785 Hyperlipidemia, unspecified: Secondary | ICD-10-CM

## 2013-08-21 NOTE — Progress Notes (Signed)
Patient here for labs only. 

## 2013-08-22 ENCOUNTER — Encounter: Payer: Self-pay | Admitting: *Deleted

## 2013-08-22 LAB — CBC WITH DIFFERENTIAL
Basophils Absolute: 0 10*3/uL (ref 0.0–0.2)
Basos: 1 %
EOS: 3 %
Eosinophils Absolute: 0.1 10*3/uL (ref 0.0–0.4)
HCT: 36.9 % (ref 34.0–46.6)
Hemoglobin: 12.5 g/dL (ref 11.1–15.9)
IMMATURE GRANULOCYTES: 0 %
Immature Grans (Abs): 0 10*3/uL (ref 0.0–0.1)
Lymphocytes Absolute: 1.6 10*3/uL (ref 0.7–3.1)
Lymphs: 43 %
MCH: 30.9 pg (ref 26.6–33.0)
MCHC: 33.9 g/dL (ref 31.5–35.7)
MCV: 91 fL (ref 79–97)
MONOS ABS: 0.4 10*3/uL (ref 0.1–0.9)
Monocytes: 10 %
NEUTROS PCT: 43 %
Neutrophils Absolute: 1.7 10*3/uL (ref 1.4–7.0)
PLATELETS: 284 10*3/uL (ref 150–379)
RBC: 4.05 x10E6/uL (ref 3.77–5.28)
RDW: 13.6 % (ref 12.3–15.4)
WBC: 3.8 10*3/uL (ref 3.4–10.8)

## 2013-08-22 NOTE — Progress Notes (Signed)
Quick Note:  Copy of labs sent to patient ______ 

## 2013-08-23 LAB — CMP14+EGFR
A/G RATIO: 2 (ref 1.1–2.5)
ALK PHOS: 61 IU/L (ref 39–117)
ALT: 50 IU/L — AB (ref 0–32)
AST: 46 IU/L — AB (ref 0–40)
Albumin: 4.7 g/dL (ref 3.6–4.8)
BILIRUBIN TOTAL: 0.4 mg/dL (ref 0.0–1.2)
BUN/Creatinine Ratio: 14 (ref 11–26)
BUN: 10 mg/dL (ref 8–27)
CHLORIDE: 99 mmol/L (ref 97–108)
CO2: 24 mmol/L (ref 18–29)
Calcium: 9.5 mg/dL (ref 8.7–10.3)
Creatinine, Ser: 0.72 mg/dL (ref 0.57–1.00)
GFR, EST AFRICAN AMERICAN: 100 mL/min/{1.73_m2} (ref >59)
GFR, EST NON AFRICAN AMERICAN: 87 mL/min/{1.73_m2} (ref >59)
GLUCOSE: 90 mg/dL (ref 65–99)
Globulin, Total: 2.4 g/dL (ref 1.5–4.5)
POTASSIUM: 4.4 mmol/L (ref 3.5–5.2)
SODIUM: 142 mmol/L (ref 134–144)
Total Protein: 7.1 g/dL (ref 6.0–8.5)

## 2013-08-23 LAB — NMR, LIPOPROFILE
Cholesterol: 135 mg/dL (ref <200)
HDL Cholesterol by NMR: 58 mg/dL (ref >=40)
HDL Particle Number: 37.6 umol/L (ref >=30.5)
LDL PARTICLE NUMBER: 637 nmol/L (ref <1000)
LDL SIZE: 20.2 nm — AB (ref >20.5)
LDLC SERPL CALC-MCNC: 41 mg/dL (ref <100)
SMALL LDL PARTICLE NUMBER: 396 nmol/L (ref <=527)
Triglycerides by NMR: 182 mg/dL — ABNORMAL HIGH (ref <150)

## 2013-08-23 LAB — THYROID PANEL WITH TSH
Free Thyroxine Index: 2.3 (ref 1.2–4.9)
T3 UPTAKE RATIO: 31 % (ref 24–39)
T4 TOTAL: 7.5 ug/dL (ref 4.5–12.0)
TSH: 4.38 u[IU]/mL (ref 0.450–4.500)

## 2013-08-23 LAB — VITAMIN D 25 HYDROXY (VIT D DEFICIENCY, FRACTURES): Vit D, 25-Hydroxy: 36.3 ng/mL (ref 30.0–100.0)

## 2013-08-28 ENCOUNTER — Other Ambulatory Visit: Payer: Self-pay | Admitting: Dermatology

## 2013-08-29 ENCOUNTER — Ambulatory Visit (INDEPENDENT_AMBULATORY_CARE_PROVIDER_SITE_OTHER): Payer: Medicare Other | Admitting: Family Medicine

## 2013-08-29 ENCOUNTER — Ambulatory Visit (INDEPENDENT_AMBULATORY_CARE_PROVIDER_SITE_OTHER): Payer: Medicare Other

## 2013-08-29 ENCOUNTER — Encounter: Payer: Self-pay | Admitting: Family Medicine

## 2013-08-29 VITALS — BP 109/65 | HR 74 | Temp 97.0°F | Ht 63.0 in | Wt 139.0 lb

## 2013-08-29 DIAGNOSIS — R141 Gas pain: Secondary | ICD-10-CM

## 2013-08-29 DIAGNOSIS — E039 Hypothyroidism, unspecified: Secondary | ICD-10-CM

## 2013-08-29 DIAGNOSIS — R143 Flatulence: Secondary | ICD-10-CM

## 2013-08-29 DIAGNOSIS — K219 Gastro-esophageal reflux disease without esophagitis: Secondary | ICD-10-CM

## 2013-08-29 DIAGNOSIS — E785 Hyperlipidemia, unspecified: Secondary | ICD-10-CM

## 2013-08-29 DIAGNOSIS — I10 Essential (primary) hypertension: Secondary | ICD-10-CM

## 2013-08-29 DIAGNOSIS — R142 Eructation: Secondary | ICD-10-CM

## 2013-08-29 DIAGNOSIS — Z23 Encounter for immunization: Secondary | ICD-10-CM

## 2013-08-29 DIAGNOSIS — R140 Abdominal distension (gaseous): Secondary | ICD-10-CM

## 2013-08-29 NOTE — Addendum Note (Signed)
Addended by: Zannie Cove on: 08/29/2013 10:05 AM   Modules accepted: Orders

## 2013-08-29 NOTE — Progress Notes (Signed)
Subjective:    Patient ID: Marissa Perez, female    DOB: 06/16/1946, 68 y.o.   MRN: [id number]  HPI Pt here for follow up and management of chronic medical problems. The patient has had more problems with bloating and gas. She is still seeing the orthopedist regarding her feet. She is due a Prevnar vaccine today. He will also get a chest x-ray today. Her labs will be reviewed with her today.       Patient Active Problem List   Diagnosis Date Noted  . Hypothyroidism 12/11/2012  . Hyperlipidemia 12/11/2012  . Hypertension 12/11/2012  . GERD (gastroesophageal reflux disease) 12/11/2012   Outpatient Encounter Prescriptions as of 08/29/2013  Medication Sig  . aspirin (ASPIRIN LOW DOSE) 81 MG EC tablet Take 81 mg by mouth daily.    [name] Kitchen atorvastatin (LIPITOR) 20 MG tablet TAKE 1 TABLET ONCE A DAY  . BIOTIN PO Take by mouth.    . Calcium Citrate-Vitamin D (CALCIUM CITRATE + D3 MAXIMUM) 315-250 MG-UNIT TABS Take 1 tablet by mouth 2 (two) times daily.  . Cholecalciferol (VITAMIN D) 2000 UNITS CAPS Take by mouth daily.    [name] Kitchen ezetimibe (ZETIA) 10 MG tablet Take 1 tablet (10 mg total) by mouth daily.  . Foot Care Products ([employer] GEL HEEL CUSHION WOMENS) PADS 1 Package by Does not apply route daily.  [name] Kitchen levothyroxine (SYNTHROID, LEVOTHROID) 50 MCG tablet Take 1 tablet (50 mcg total) by mouth as directed. Take one tablet by mouth Monday, Wednesday and Friday. And take half tablet by mouth Tuesday, Thursday, Saturday and Sunday  . Multiple Vitamin (MULTI-VITAMIN PO) Take by mouth daily.    Earney Navy Bicarbonate (ZEGERID PO) Take 40 mg by mouth daily.   [name] Kitchen OVER THE COUNTER MEDICATION Take 4 capsules by mouth daily. JR Carlson's Elite Omega Fish oil  . PREMARIN vaginal cream USE 1 GM VAGINALLY 2 TO 3 TIMES A WEEK  . valsartan (DIOVAN) 160 MG tablet Take 0.5 tablets (80 mg total) by mouth daily.  . [DISCONTINUED] atorvastatin (LIPITOR) 10 MG tablet Take 1 tablet (10 mg total) by mouth daily.  .  [DISCONTINUED] meloxicam (MOBIC) 15 MG tablet Take 7.5 mg by mouth daily.  . [DISCONTINUED] oseltamivir (TAMIFLU) 75 MG capsule Take 1 capsule (75 mg total) by mouth daily.    Review of Systems  Constitutional: Negative.   HENT: Negative.   Eyes: Negative.   Respiratory: Negative.   Cardiovascular: Negative.   Gastrointestinal: Negative.        Increase in gas and bloating (even with Zegrid)  Endocrine: Negative.   Genitourinary: Negative.   Musculoskeletal: Negative.   Skin: Negative.   Allergic/Immunologic: Negative.   Neurological: Negative.   Hematological: Negative.   Psychiatric/Behavioral: Negative.        Objective:   Physical Exam  Nursing note and vitals reviewed. Constitutional: She is oriented to person, place, and time. She appears well-developed and well-nourished. No distress.  HENT:  Head: Normocephalic and atraumatic.  Right Ear: External ear normal.  Left Ear: External ear normal.  Nose: Nose normal.  Mouth/Throat: Oropharynx is clear and moist.  Nasal congestion and turbinate swelling bilaterally  Eyes: Conjunctivae and EOM are normal. Pupils are equal, round, and reactive to light. Right eye exhibits no discharge. Left eye exhibits no discharge. No scleral icterus.  Neck: Normal range of motion. Neck supple. No thyromegaly present.  No carotid bruits  Cardiovascular: Normal rate, regular rhythm, normal heart sounds and intact distal pulses.  Exam reveals no  gallop and no friction rub.   No murmur heard. Pulmonary/Chest: Effort normal and breath sounds normal. No respiratory distress. She has no wheezes. She has no rales. She exhibits no tenderness.  No axillary adenopathy  Abdominal: Soft. Bowel sounds are normal. She exhibits no mass. There is no tenderness. There is no rebound and no guarding.  Specifically no abdominal tenderness and no inguinal nodes.  Musculoskeletal: Normal range of motion. She exhibits no edema and no tenderness.    Lymphadenopathy:    She has no cervical adenopathy.  Neurological: She is alert and oriented to person, place, and time. She has normal reflexes. No cranial nerve deficit.  Skin: Skin is warm and dry. No rash noted.  No worrisome skin lesions  Psychiatric: She has a normal mood and affect. Her behavior is normal. Judgment and thought content normal.   BP 109/65  Pulse 74  Temp(Src) 97 F (36.1 C) (Oral)  Ht 5\' 3"  (1.6 m)  Wt 139 lb (63.05 kg)  BMI 24.63 kg/m2  [facility] reading (PRIMARY) by  Dr. Brunilda Payor x-ray--no active disease, degenerative changes in spine                                         Assessment & Plan:  1. GERD (gastroesophageal reflux disease)  2. Hyperlipidemia  3. Hypertension - DG Chest 2 View; Future  4. Hypothyroidism  5. Abdominal bloating Patient Instructions  Continue current medications. Continue good therapeutic lifestyle changes which include good diet and exercise. Fall precautions discussed with patient. Schedule your flu vaccine if you haven't had it yet If you are over 48 years old - you may need Prevnar 57 or the adult Pneumonia vaccine. Try Zantac 150 over-the-counter before breakfast and supper for stomach Overweight milk cheese ice cream and dairy products Drink more fluid The Prevnar vaccine may make your arm sore, the aware of this. Return the FOBT.   Arrie Senate MD

## 2013-08-29 NOTE — Addendum Note (Signed)
Addended by: Pollyann Kennedy F on: 08/29/2013 11:43 AM   Modules accepted: Orders

## 2013-08-29 NOTE — Patient Instructions (Addendum)
Continue current medications. Continue good therapeutic lifestyle changes which include good diet and exercise. Fall precautions discussed with patient. Schedule your flu vaccine if you haven't had it yet If you are over 68 years old - you may need Prevnar 22 or the adult Pneumonia vaccine. Try Zantac 150 over-the-counter before breakfast and supper for stomach Overweight milk cheese ice cream and dairy products Drink more fluid The Prevnar vaccine may make your arm sore, the aware of this. Return the FOBT.

## 2013-08-30 ENCOUNTER — Encounter: Payer: Self-pay | Admitting: Gastroenterology

## 2013-08-30 ENCOUNTER — Other Ambulatory Visit: Payer: Self-pay

## 2013-08-30 DIAGNOSIS — Z1231 Encounter for screening mammogram for malignant neoplasm of breast: Secondary | ICD-10-CM

## 2013-08-30 LAB — NMR, LIPOPROFILE
Cholesterol: 143 mg/dL (ref <200)
HDL CHOLESTEROL BY NMR: 57 mg/dL (ref >=40)
HDL PARTICLE NUMBER: 40.6 umol/L (ref >=30.5)
LDL Particle Number: 974 nmol/L (ref <1000)
LDL Size: 20.3 nm — ABNORMAL LOW (ref >20.5)
LDLC SERPL CALC-MCNC: 48 mg/dL (ref <100)
LP-IR Score: 72 — ABNORMAL HIGH (ref <=45)
Small LDL Particle Number: 527 nmol/L (ref <=527)
TRIGLYCERIDES BY NMR: 190 mg/dL — AB (ref <150)

## 2013-09-03 ENCOUNTER — Other Ambulatory Visit: Payer: Medicare Other

## 2013-09-03 DIAGNOSIS — Z1212 Encounter for screening for malignant neoplasm of rectum: Secondary | ICD-10-CM

## 2013-09-05 LAB — FECAL OCCULT BLOOD, IMMUNOCHEMICAL: Fecal Occult Bld: NEGATIVE

## 2013-09-12 ENCOUNTER — Encounter: Payer: Self-pay | Admitting: Family Medicine

## 2013-09-24 ENCOUNTER — Other Ambulatory Visit: Payer: Self-pay | Admitting: Family Medicine

## 2013-09-25 MED ORDER — VALSARTAN 320 MG PO TABS: ORAL_TABLET | ORAL | Status: DC

## 2013-09-25 NOTE — Telephone Encounter (Signed)
Call patient and confirm what strength that she is taking and refill this for 6 month

## 2013-09-25 NOTE — Telephone Encounter (Signed)
Received rx request from pharmacy for diovan 360 but according to med list she takes diovan 160 and takes 1/2 tab. Please advise

## 2013-10-01 ENCOUNTER — Ambulatory Visit
Admission: RE | Admit: 2013-10-01 | Discharge: 2013-10-01 | Disposition: A | Discharge status: Home / Self Care | Payer: Medicare Other | Source: Ambulatory Visit

## 2013-10-01 ENCOUNTER — Ambulatory Visit (AMBULATORY_SURGERY_CENTER): Payer: Self-pay | Admitting: *Deleted

## 2013-10-01 VITALS — Ht 63.0 in | Wt 139.2 lb

## 2013-10-01 DIAGNOSIS — Z1231 Encounter for screening mammogram for malignant neoplasm of breast: Secondary | ICD-10-CM

## 2013-10-01 DIAGNOSIS — Z1211 Encounter for screening for malignant neoplasm of colon: Secondary | ICD-10-CM

## 2013-10-01 MED ORDER — NA SULFATE-K SULFATE-MG SULF 17.5-3.13-1.6 GM/177ML PO SOLN: 1.0000 | Freq: Once | ORAL | Status: DC

## 2013-10-01 NOTE — Progress Notes (Signed)
No allergies to eggs or soy. No problems with anesthesia.  

## 2013-10-03 ENCOUNTER — Other Ambulatory Visit: Payer: Self-pay | Admitting: Family Medicine

## 2013-10-05 ENCOUNTER — Encounter: Payer: Self-pay | Admitting: Gastroenterology

## 2013-10-10 ENCOUNTER — Encounter: Payer: Self-pay | Admitting: Gastroenterology

## 2013-10-10 ENCOUNTER — Ambulatory Visit (AMBULATORY_SURGERY_CENTER): Payer: Medicare Other | Admitting: Gastroenterology

## 2013-10-10 VITALS — BP 136/71 | HR 63 | Temp 97.4°F | Resp 17 | Ht 63.0 in | Wt 139.0 lb

## 2013-10-10 DIAGNOSIS — K5730 Diverticulosis of large intestine without perforation or abscess without bleeding: Secondary | ICD-10-CM

## 2013-10-10 DIAGNOSIS — Z1211 Encounter for screening for malignant neoplasm of colon: Secondary | ICD-10-CM

## 2013-10-10 MED ORDER — SODIUM CHLORIDE 0.9 % IV SOLN: 500.0000 mL | INTRAVENOUS | Status: DC

## 2013-10-10 NOTE — Progress Notes (Signed)
Patient denies any allergies to eggs or soy. Patient denies any problems with anesthesia.  

## 2013-10-10 NOTE — Patient Instructions (Signed)
YOU HAD AN ENDOSCOPIC PROCEDURE TODAY AT THE  ENDOSCOPY CENTER: Refer to the procedure report that was given to you for any specific questions about what was found during the examination.  If the procedure report does not answer your questions, please call your gastroenterologist to clarify.  If you requested that your care partner not be given the details of your procedure findings, then the procedure report has been included in a sealed envelope for you to review at your convenience later.  YOU SHOULD EXPECT: Some feelings of bloating in the abdomen. Passage of more gas than usual.  Walking can help get rid of the air that was put into your GI tract during the procedure and reduce the bloating. If you had a lower endoscopy (such as a colonoscopy or flexible sigmoidoscopy) you may notice spotting of blood in your stool or on the toilet paper. If you underwent a bowel prep for your procedure, then you may not have a normal bowel movement for a few days.  DIET: Your first meal following the procedure should be a light meal and then it is ok to progress to your normal diet.  A half-sandwich or bowl of soup is an example of a good first meal.  Heavy or fried foods are harder to digest and may make you feel nauseous or bloated.  Likewise meals heavy in dairy and vegetables can cause extra gas to form and this can also increase the bloating.  Drink plenty of fluids but you should avoid alcoholic beverages for 24 hours.  ACTIVITY: Your care partner should take you home directly after the procedure.  You should plan to take it easy, moving slowly for the rest of the day.  You can resume normal activity the day after the procedure however you should NOT DRIVE or use heavy machinery for 24 hours (because of the sedation medicines used during the test).    SYMPTOMS TO REPORT IMMEDIATELY: A gastroenterologist can be reached at any hour.  During normal business hours, 8:30 AM to 5:00 PM Monday through Friday,  call [phone].  After hours and on weekends, please call the GI answering service at [phone] who will take a message and have the physician on call contact you.   Following lower endoscopy (colonoscopy or flexible sigmoidoscopy):  Excessive amounts of blood in the stool  Significant tenderness or worsening of abdominal pains  Swelling of the abdomen that is new, acute  Fever of 100F or higher  FOLLOW UP: If any biopsies were taken you will be contacted by phone or by letter within the next 1-3 weeks.  Call your gastroenterologist if you have not heard about the biopsies in 3 weeks.  Our staff will call the home number listed on your records the next business day following your procedure to check on you and address any questions or concerns that you may have at that time regarding the information given to you following your procedure. This is a courtesy call and so if there is no answer at the home number and we have not heard from you through the emergency physician on call, we will assume that you have returned to your regular daily activities without incident.  SIGNATURES/CONFIDENTIALITY: You and/or your care partner have signed paperwork which will be entered into your electronic medical record.  These signatures attest to the fact that that the information above on your After Visit Summary has been reviewed and is understood.  Full responsibility of the confidentiality of this   discharge information lies with you and/or your care-partner.  Resume medications. Information given on diverticulosis and high fiber diet with discharge instructions. 

## 2013-10-10 NOTE — Progress Notes (Signed)
Report to pacu rn, vss, bbs=clear 

## 2013-10-10 NOTE — Op Note (Signed)
[facility]  [address] [place], [address]   COLONOSCOPY PROCEDURE REPORT  PATIENT: Marissa Perez, Marissa Perez  MR#: [id number] BIRTHDATE: [birth year] , 7  yrs. old GENDER: Female ENDOSCOPIST: Inda Castle, MD REFERRED SV:[id number] Laurance Flatten, M.D. PROCEDURE DATE:  10/10/2013 PROCEDURE:   Colonoscopy, diagnostic First Screening Colonoscopy - Avg.  risk and is 50 yrs.  old or older - No.  Prior Negative Screening - Now for repeat screening. 10 or more years since last screening  History of Adenoma - Now for follow-up colonoscopy & has been > or = to 3 yrs.  N/A  Polyps Removed Today? No.  Recommend repeat exam, <10 yrs? No. ASA CLASS:   Class II INDICATIONS:Average risk patient for colon cancer. MEDICATIONS: MAC sedation, administered by CRNA and propofol (Diprivan) 300mg  IV  DESCRIPTION OF PROCEDURE:   After the risks benefits and alternatives of the procedure were thoroughly explained, informed consent was obtained.  A digital rectal exam revealed no abnormalities of the rectum.   The LB [id number]  endoscope was introduced through the anus and advanced to the cecum, which was identified by both the appendix and ileocecal valve. No adverse events experienced.   The quality of the prep was excellent using Suprep  The instrument was then slowly withdrawn as the colon was fully examined.      COLON FINDINGS: Mild diverticulosis was noted in the sigmoid colon and descending colon.   The colon was otherwise normal.  There was no diverticulosis, inflammation, polyps or cancers unless previously stated.  Retroflexed views revealed no abnormalities. The time to cecum=2 minutes 10 seconds.  Withdrawal time=7 minutes 59 seconds.  The scope was withdrawn and the procedure completed. COMPLICATIONS: There were no complications.  ENDOSCOPIC IMPRESSION: 1.   Mild diverticulosis was noted in the sigmoid colon and descending colon 2.   The colon was otherwise  normal  RECOMMENDATIONS: Continue current colorectal screening recommendations for "routine risk" patients with a repeat colonoscopy in 10 years.   eSigned:  Inda Castle, MD 10/10/2013 2:04 PM   cc:   PATIENT NAME:  Marissa Perez, Marissa Perez MR#: [id number]

## 2013-10-11 ENCOUNTER — Telehealth: Payer: Self-pay | Admitting: *Deleted

## 2013-10-11 NOTE — Telephone Encounter (Signed)
  Follow up Call-  Call back number 10/10/2013  Post procedure Call Back phone  # [phone]  Permission to leave phone message Yes     Patient questions:  Do you have a fever, pain , or abdominal swelling? no Pain Score  0 *  Have you tolerated food without any problems? yes  Have you been able to return to your normal activities? yes  Do you have any questions about your discharge instructions: Diet   no Medications  no Follow up visit  no  Do you have questions or concerns about your Care? yes  Actions: * If pain score is 4 or above: No action needed, pain <4.  Pt's spouse reported that after pt was home her IV site swelled up, there was no pain or redness, pt reported some blood oozed from site, pt placed ice on site and swelling went down, pt's spouse reports that site was fine this morning, advised spouse that if any more swelling, redness, oozing, or any changes to site to please call us back-adm

## 2013-10-12 ENCOUNTER — Telehealth: Payer: Self-pay | Admitting: Gastroenterology

## 2013-10-12 NOTE — Telephone Encounter (Signed)
Called patient to follow up on her complaint with IV site of her right hand.  Denies any further oozing of blood from site today after applying ice yesterday, slight redness at site and bruising but no heat in the hand. Leaving to go out of town on Tuesday of next week and wanted to be certain she wasn't neglecting any problem with this site.  Since no further bleeding from site, encouraged warm compresses every 3-4 hours as can do today to help with bruising.  Use Ibuprofen or Aleve over the weekend and call on Monday if no improvement.

## 2013-12-27 ENCOUNTER — Other Ambulatory Visit (INDEPENDENT_AMBULATORY_CARE_PROVIDER_SITE_OTHER): Payer: Medicare Other

## 2013-12-27 DIAGNOSIS — I10 Essential (primary) hypertension: Secondary | ICD-10-CM

## 2013-12-27 DIAGNOSIS — E559 Vitamin D deficiency, unspecified: Secondary | ICD-10-CM

## 2013-12-27 DIAGNOSIS — E039 Hypothyroidism, unspecified: Secondary | ICD-10-CM

## 2013-12-27 DIAGNOSIS — E785 Hyperlipidemia, unspecified: Secondary | ICD-10-CM

## 2013-12-27 LAB — POCT CBC
Granulocyte percent: 55.7 %G (ref 37–80)
HCT, POC: 39.5 % (ref 37.7–47.9)
HEMOGLOBIN: 12.7 g/dL (ref 12.2–16.2)
Lymph, poc: 1.9 (ref 0.6–3.4)
MCH: 30 pg (ref 27–31.2)
MCHC: 32.2 g/dL (ref 31.8–35.4)
MCV: 93.1 fL (ref 80–97)
MPV: 6.9 fL (ref 0–99.8)
PLATELET COUNT, POC: 295 10*3/uL (ref 142–424)
POC Granulocyte: 2.6 (ref 2–6.9)
POC LYMPH PERCENT: 40.4 %L (ref 10–50)
RBC: 4.2 M/uL (ref 4.04–5.48)
RDW, POC: 12.9 %
WBC: 4.6 10*3/uL (ref 4.6–10.2)

## 2013-12-27 NOTE — Progress Notes (Signed)
Pt came in for labs only 

## 2013-12-28 LAB — BMP8+EGFR
BUN/Creatinine Ratio: 19 (ref 11–26)
BUN: 14 mg/dL (ref 8–27)
CALCIUM: 9.7 mg/dL (ref 8.7–10.3)
CO2: 25 mmol/L (ref 18–29)
CREATININE: 0.74 mg/dL (ref 0.57–1.00)
Chloride: 98 mmol/L (ref 97–108)
GFR calc Af Amer: 96 mL/min/{1.73_m2} (ref >59)
GFR, EST NON AFRICAN AMERICAN: 84 mL/min/{1.73_m2} (ref >59)
GLUCOSE: 86 mg/dL (ref 65–99)
Potassium: 4.6 mmol/L (ref 3.5–5.2)
SODIUM: 137 mmol/L (ref 134–144)

## 2013-12-28 LAB — THYROID PANEL WITH TSH
Free Thyroxine Index: 2.1 (ref 1.2–4.9)
T3 Uptake Ratio: 35 % (ref 24–39)
T4, Total: 6.1 ug/dL (ref 4.5–12.0)
TSH: 5.85 u[IU]/mL — ABNORMAL HIGH (ref 0.450–4.500)

## 2013-12-28 LAB — NMR, LIPOPROFILE
Cholesterol: 165 mg/dL (ref 100–199)
HDL CHOLESTEROL BY NMR: 74 mg/dL (ref >39)
HDL Particle Number: 53.1 umol/L (ref >=30.5)
LDL Particle Number: 891 nmol/L (ref <1000)
LDL SIZE: 20.5 nm (ref >20.5)
LDLC SERPL CALC-MCNC: 61 mg/dL (ref 0–99)
LP-IR Score: 47 — ABNORMAL HIGH (ref <=45)
SMALL LDL PARTICLE NUMBER: 565 nmol/L — AB (ref <=527)
TRIGLYCERIDES BY NMR: 151 mg/dL — AB (ref 0–149)

## 2013-12-28 LAB — HEPATIC FUNCTION PANEL
ALBUMIN: 4.9 g/dL — AB (ref 3.6–4.8)
ALT: 29 IU/L (ref 0–32)
AST: 25 IU/L (ref 0–40)
Alkaline Phosphatase: 57 IU/L (ref 39–117)
BILIRUBIN DIRECT: 0.12 mg/dL (ref 0.00–0.40)
TOTAL PROTEIN: 7 g/dL (ref 6.0–8.5)
Total Bilirubin: 0.4 mg/dL (ref 0.0–1.2)

## 2013-12-28 LAB — VITAMIN D 25 HYDROXY (VIT D DEFICIENCY, FRACTURES): Vit D, 25-Hydroxy: 38.8 ng/mL (ref 30.0–100.0)

## 2014-01-02 ENCOUNTER — Encounter: Payer: Self-pay | Admitting: Family Medicine

## 2014-01-02 ENCOUNTER — Ambulatory Visit (INDEPENDENT_AMBULATORY_CARE_PROVIDER_SITE_OTHER): Payer: Medicare Other | Admitting: Family Medicine

## 2014-01-02 VITALS — BP 129/72 | HR 59 | Temp 97.0°F | Ht 63.0 in | Wt 140.0 lb

## 2014-01-02 DIAGNOSIS — E785 Hyperlipidemia, unspecified: Secondary | ICD-10-CM

## 2014-01-02 DIAGNOSIS — E039 Hypothyroidism, unspecified: Secondary | ICD-10-CM

## 2014-01-02 DIAGNOSIS — I10 Essential (primary) hypertension: Secondary | ICD-10-CM

## 2014-01-02 DIAGNOSIS — M79609 Pain in unspecified limb: Secondary | ICD-10-CM

## 2014-01-02 DIAGNOSIS — M79672 Pain in left foot: Secondary | ICD-10-CM

## 2014-01-02 DIAGNOSIS — K219 Gastro-esophageal reflux disease without esophagitis: Secondary | ICD-10-CM

## 2014-01-02 MED ORDER — LEVOTHYROXINE SODIUM 50 MCG PO TABS: 50.0000 ug | ORAL_TABLET | ORAL | Status: DC

## 2014-01-02 MED ORDER — VALSARTAN 320 MG PO TABS: ORAL_TABLET | ORAL | Status: DC

## 2014-01-02 MED ORDER — ESTROGENS, CONJUGATED 0.625 MG/GM VA CREA: TOPICAL_CREAM | VAGINAL | Status: DC

## 2014-01-02 MED ORDER — ATORVASTATIN CALCIUM 20 MG PO TABS: ORAL_TABLET | ORAL | Status: DC

## 2014-01-02 MED ORDER — EZETIMIBE 10 MG PO TABS: 10.0000 mg | ORAL_TABLET | Freq: Every day | ORAL | Status: DC

## 2014-01-02 NOTE — Progress Notes (Signed)
Subjective:    Patient ID: Marissa Perez, female    DOB: Oct 10, 1945, 68 y.o.   MRN: [id number]  HPI Pt here for follow up and management of chronic medical problems. The patient is doing well except she is still having right foot pain for which she is seeing the orthopedist. Labs will be reviewed with her. Everything was good except we are having to increase her thyroid medication. Her cholesterol numbers were good also. The patient understands to increase the vitamin D3 and also the thyroid medication and she will come back for recheck of her thyroid tests in about 6 weeks.          Patient Active Problem List   Diagnosis Date Noted  . Hypothyroidism 12/11/2012  . Hyperlipidemia 12/11/2012  . Hypertension 12/11/2012  . GERD (gastroesophageal reflux disease) 12/11/2012   Outpatient Encounter Prescriptions as of 01/02/2014  Medication Sig  . aspirin (ASPIRIN LOW DOSE) 81 MG EC tablet Take 81 mg by mouth daily.    [name] Kitchen atorvastatin (LIPITOR) 20 MG tablet TAKE 1 TABLET ONCE A DAY  . BIOTIN PO Take by mouth.    . Calcium Citrate-Vitamin D (CALCIUM CITRATE + D3 MAXIMUM) 315-250 MG-UNIT TABS Take 1 tablet by mouth 2 (two) times daily.  . Cholecalciferol (VITAMIN D) 2000 UNITS CAPS Take by mouth daily.    . clobetasol (TEMOVATE) 0.05 % external solution   . ezetimibe (ZETIA) 10 MG tablet Take 1 tablet (10 mg total) by mouth daily.  [name] Kitchen levothyroxine (SYNTHROID, LEVOTHROID) 50 MCG tablet Take 50 mcg by mouth as directed. Take one half tablet by mouth Monday, Wednesday and Friday. And take one tablet by mouth Tuesday, Thursday, Saturday and Sunday  . meloxicam (MOBIC) 15 MG tablet Take 1 tablet by mouth daily.  . Multiple Vitamin (MULTI-VITAMIN PO) Take by mouth daily.    [name] Kitchen OVER THE COUNTER MEDICATION Take 4 capsules by mouth daily. JR Carlson's Elite Omega Fish oil  . PREMARIN vaginal cream USE 1 GM VAGINALLY 2 TO 3 TIMES A WEEK  . ranitidine (ZANTAC) 150 MG capsule Take 150 mg by mouth daily.   . valsartan (DIOVAN) 320 MG tablet 1 daily as directed  . [DISCONTINUED] levothyroxine (SYNTHROID, LEVOTHROID) 50 MCG tablet Take 1 tablet (50 mcg total) by mouth as directed. Take one tablet by mouth Monday, Wednesday and Friday. And take half tablet by mouth Tuesday, Thursday, Saturday and Sunday  . [DISCONTINUED] Foot Care Products ([employer] GEL HEEL CUSHION WOMENS) PADS 1 Package by Does not apply route daily.  . [DISCONTINUED] Omeprazole-Sodium Bicarbonate (ZEGERID PO) Take 40 mg by mouth daily.     Review of Systems  Constitutional: Negative.   HENT: Negative.   Eyes: Negative.   Respiratory: Negative.   Cardiovascular: Negative.   Gastrointestinal: Negative.   Endocrine: Negative.   Genitourinary: Negative.   Musculoskeletal: Negative.  Arthralgias: still having right foot pain- saw Dr Doran Durand yesterday.  Skin: Negative.   Allergic/Immunologic: Negative.   Neurological: Negative.   Hematological: Negative.   Psychiatric/Behavioral: Negative.        Objective:   Physical Exam  Nursing note and vitals reviewed. Constitutional: She is oriented to person, place, and time. She appears well-developed and well-nourished. No distress.  HENT:  Head: Normocephalic and atraumatic.  Right Ear: External ear normal.  Left Ear: External ear normal.  Nose: Nose normal.  Mouth/Throat: Oropharynx is clear and moist.  Eyes: Conjunctivae and EOM are normal. Pupils are equal, round, and reactive to light. Right eye  exhibits no discharge. Left eye exhibits no discharge. No scleral icterus.  Neck: Normal range of motion. Neck supple. No thyromegaly present.  No carotid bruits  Cardiovascular: Normal rate, regular rhythm, normal heart sounds and intact distal pulses.  Exam reveals no friction rub.   No murmur heard. At 60 per minute  Pulmonary/Chest: Effort normal and breath sounds normal. No respiratory distress. She has no wheezes. She has no rales. She exhibits no tenderness.  No axillary  nodes  Abdominal: Soft. Bowel sounds are normal. She exhibits no mass. There is no tenderness. There is no rebound and no guarding.  No inguinal nodes  Musculoskeletal: Normal range of motion. She exhibits no edema and no tenderness.  No reproducible pain and plantar foot or heel and no edema or swelling.  Lymphadenopathy:    She has no cervical adenopathy.  Neurological: She is alert and oriented to person, place, and time. She has normal reflexes. No cranial nerve deficit.  Skin: Skin is warm and dry. No rash noted.  Psychiatric: She has a normal mood and affect. Her behavior is normal. Judgment and thought content normal.   BP 129/72  Pulse 59  Temp(Src) 97 F (36.1 C) (Oral)  Ht 5\' 3"  (1.6 m)  Wt 140 lb (63.504 kg)  BMI 24.81 kg/m2        Assessment & Plan:  1. Hyperlipidemia  2. Hypertension  3. Hypothyroidism -Adjust thyroid medication as directed -Repeat  thyroid profile in 6 weeks  4. GERD (gastroesophageal reflux disease)  5. Left foot pain - continue followup with orthopedist  Meds ordered this encounter  Medications  . meloxicam (MOBIC) 15 MG tablet    Sig: Take 1 tablet by mouth daily.  . clobetasol (TEMOVATE) 0.05 % external solution    Sig:   . DISCONTD: levothyroxine (SYNTHROID, LEVOTHROID) 50 MCG tablet    Sig: Take 50 mcg by mouth as directed. Take one half tablet by mouth Monday, Wednesday and Friday. And take one tablet by mouth Tuesday, Thursday, Saturday and Sunday  . valsartan (DIOVAN) 320 MG tablet    Sig: 1 daily as directed    Dispense:  30 tablet    Refill:  5  . conjugated estrogens (PREMARIN) vaginal cream    Sig: USE 1 GM VAGINALLY 2 TO 3 TIMES A WEEK    Dispense:  30 g    Refill:  5  . DISCONTD: levothyroxine (SYNTHROID, LEVOTHROID) 50 MCG tablet    Sig: Take 1 tablet (50 mcg total) by mouth as directed. Take one half tablet by mouth Monday, Wednesday and Friday. And take one tablet by mouth Tuesday, Thursday, Saturday and Sunday     Dispense:  30 tablet    Refill:  5  . ezetimibe (ZETIA) 10 MG tablet    Sig: Take 1 tablet (10 mg total) by mouth daily.    Dispense:  30 tablet    Refill:  5    Order Specific Question:  Supervising Provider    Answer:  Chipper Herb [[id number]]  . atorvastatin (LIPITOR) 20 MG tablet    Sig: TAKE 1 TABLET ONCE A DAY    Dispense:  30 tablet    Refill:  5  . levothyroxine (SYNTHROID, LEVOTHROID) 50 MCG tablet    Sig: Take 1 tablet (50 mcg total) by mouth as directed. Take one half tablet by mouth Monday, Wednesday and Friday. And take one tablet by mouth Tuesday, Thursday, Saturday and Sunday    Dispense:  30 tablet  Refill:  5   Patient Instructions                       Medicare Annual Wellness Visit  [facility] and the medical providers at [facility] strive to bring you the best medical care.  In doing so we not only want to address your current medical conditions and concerns but also to detect new conditions early and prevent illness, disease and health-related problems.    Medicare offers a yearly Wellness Visit which allows our clinical staff to assess your need for preventative services including immunizations, lifestyle education, counseling to decrease risk of preventable diseases and screening for fall risk and other medical concerns.    This visit is provided free of charge (no copay) for all Medicare recipients. The clinical pharmacists at [facility] have begun to conduct these Wellness Visits which will also include a thorough review of all your medications.    As you primary medical provider recommend that you make an appointment for your Annual Wellness Visit if you have not done so already this year.  You may set up this appointment before you leave today or you may call back ([phone]) and schedule an appointment.  Please make sure when you call that you mention that you are scheduling your Annual Wellness Visit with the  clinical pharmacist so that the appointment may be made for the proper length of time.      Continue current medications. Continue good therapeutic lifestyle changes which include good diet and exercise. Fall precautions discussed with patient. If an FOBT was given today- please return it to our front desk. If you are over 59 years old - you may need Prevnar 67 or the adult Pneumonia vaccine.  Continue to followup with the orthopedic surgeon. Continue proper exercise but according exercises that will cause more pain in the foot.   Arrie Senate MD

## 2014-01-02 NOTE — Patient Instructions (Addendum)
Medicare Annual Wellness Visit  [facility] and the medical providers at [facility] strive to bring you the best medical care.  In doing so we not only want to address your current medical conditions and concerns but also to detect new conditions early and prevent illness, disease and health-related problems.    Medicare offers a yearly Wellness Visit which allows our clinical staff to assess your need for preventative services including immunizations, lifestyle education, counseling to decrease risk of preventable diseases and screening for fall risk and other medical concerns.    This visit is provided free of charge (no copay) for all Medicare recipients. The clinical pharmacists at [facility] have begun to conduct these Wellness Visits which will also include a thorough review of all your medications.    As you primary medical provider recommend that you make an appointment for your Annual Wellness Visit if you have not done so already this year.  You may set up this appointment before you leave today or you may call back ([phone]) and schedule an appointment.  Please make sure when you call that you mention that you are scheduling your Annual Wellness Visit with the clinical pharmacist so that the appointment may be made for the proper length of time.      Continue current medications. Continue good therapeutic lifestyle changes which include good diet and exercise. Fall precautions discussed with patient. If an FOBT was given today- please return it to our front desk. If you are over 9 years old - you may need Prevnar 39 or the adult Pneumonia vaccine.  Continue to followup with the orthopedic surgeon. Continue proper exercise but according exercises that will cause more pain in the foot.

## 2014-02-06 ENCOUNTER — Other Ambulatory Visit (INDEPENDENT_AMBULATORY_CARE_PROVIDER_SITE_OTHER): Payer: Medicare Other

## 2014-02-06 DIAGNOSIS — E039 Hypothyroidism, unspecified: Secondary | ICD-10-CM

## 2014-02-07 LAB — THYROID PANEL WITH TSH
Free Thyroxine Index: 2.3 (ref 1.2–4.9)
T3 UPTAKE RATIO: 35 % (ref 24–39)
T4 TOTAL: 6.7 ug/dL (ref 4.5–12.0)
TSH: 2.77 u[IU]/mL (ref 0.450–4.500)

## 2014-05-03 ENCOUNTER — Other Ambulatory Visit (INDEPENDENT_AMBULATORY_CARE_PROVIDER_SITE_OTHER): Payer: Medicare Other

## 2014-05-03 DIAGNOSIS — I10 Essential (primary) hypertension: Secondary | ICD-10-CM

## 2014-05-03 DIAGNOSIS — E559 Vitamin D deficiency, unspecified: Secondary | ICD-10-CM

## 2014-05-03 DIAGNOSIS — E784 Other hyperlipidemia: Secondary | ICD-10-CM

## 2014-05-03 DIAGNOSIS — E7849 Other hyperlipidemia: Secondary | ICD-10-CM

## 2014-05-03 DIAGNOSIS — E039 Hypothyroidism, unspecified: Secondary | ICD-10-CM

## 2014-05-03 LAB — POCT CBC
Granulocyte percent: 55.3 %G (ref 37–80)
HCT, POC: 37.3 % — AB (ref 37.7–47.9)
Hemoglobin: 12.5 g/dL (ref 12.2–16.2)
LYMPH, POC: 1.9 (ref 0.6–3.4)
MCH: 31.1 pg (ref 27–31.2)
MCHC: 33.4 g/dL (ref 31.8–35.4)
MCV: 92.9 fL (ref 80–97)
MPV: 7.2 fL (ref 0–99.8)
POC Granulocyte: 2.7 (ref 2–6.9)
POC LYMPH PERCENT: 39.6 %L (ref 10–50)
Platelet Count, POC: 292 10*3/uL (ref 142–424)
RBC: 4 M/uL — AB (ref 4.04–5.48)
RDW, POC: 12.7 %
WBC: 4.9 10*3/uL (ref 4.6–10.2)

## 2014-05-04 LAB — NMR, LIPOPROFILE
Cholesterol: 157 mg/dL (ref 100–199)
HDL Cholesterol by NMR: 67 mg/dL (ref >39)
HDL PARTICLE NUMBER: 48.9 umol/L (ref >=30.5)
LDL Particle Number: 866 nmol/L (ref <1000)
LDL SIZE: 20.2 nm (ref >20.5)
LDLC SERPL CALC-MCNC: 61 mg/dL (ref 0–99)
LP-IR Score: 60 — ABNORMAL HIGH (ref <=45)
SMALL LDL PARTICLE NUMBER: 499 nmol/L (ref <=527)
Triglycerides by NMR: 144 mg/dL (ref 0–149)

## 2014-05-04 LAB — HEPATIC FUNCTION PANEL
ALBUMIN: 5.1 g/dL — AB (ref 3.6–4.8)
ALT: 27 IU/L (ref 0–32)
AST: 26 IU/L (ref 0–40)
Alkaline Phosphatase: 60 IU/L (ref 39–117)
BILIRUBIN TOTAL: 0.4 mg/dL (ref 0.0–1.2)
Bilirubin, Direct: 0.13 mg/dL (ref 0.00–0.40)
Total Protein: 7.4 g/dL (ref 6.0–8.5)

## 2014-05-04 LAB — THYROID PANEL WITH TSH
Free Thyroxine Index: 2.1 (ref 1.2–4.9)
T3 Uptake Ratio: 33 % (ref 24–39)
T4, Total: 6.3 ug/dL (ref 4.5–12.0)
TSH: 3.54 u[IU]/mL (ref 0.450–4.500)

## 2014-05-04 LAB — BMP8+EGFR
BUN / CREAT RATIO: 18 (ref 11–26)
BUN: 14 mg/dL (ref 8–27)
CO2: 23 mmol/L (ref 18–29)
CREATININE: 0.77 mg/dL (ref 0.57–1.00)
Calcium: 9.6 mg/dL (ref 8.7–10.3)
Chloride: 97 mmol/L (ref 97–108)
GFR calc Af Amer: 92 mL/min/{1.73_m2} (ref >59)
GFR calc non Af Amer: 80 mL/min/{1.73_m2} (ref >59)
Glucose: 87 mg/dL (ref 65–99)
Potassium: 4.3 mmol/L (ref 3.5–5.2)
Sodium: 138 mmol/L (ref 134–144)

## 2014-05-04 LAB — VITAMIN D 25 HYDROXY (VIT D DEFICIENCY, FRACTURES): Vit D, 25-Hydroxy: 42.6 ng/mL (ref 30.0–100.0)

## 2014-05-10 ENCOUNTER — Ambulatory Visit (INDEPENDENT_AMBULATORY_CARE_PROVIDER_SITE_OTHER): Payer: Medicare Other | Admitting: Family Medicine

## 2014-05-10 ENCOUNTER — Encounter: Payer: Self-pay | Admitting: Family Medicine

## 2014-05-10 VITALS — BP 98/66 | HR 86 | Temp 97.0°F | Ht 63.0 in | Wt 141.0 lb

## 2014-05-10 DIAGNOSIS — E785 Hyperlipidemia, unspecified: Secondary | ICD-10-CM

## 2014-05-10 DIAGNOSIS — K589 Irritable bowel syndrome without diarrhea: Secondary | ICD-10-CM

## 2014-05-10 DIAGNOSIS — K219 Gastro-esophageal reflux disease without esophagitis: Secondary | ICD-10-CM

## 2014-05-10 DIAGNOSIS — I10 Essential (primary) hypertension: Secondary | ICD-10-CM

## 2014-05-10 DIAGNOSIS — E039 Hypothyroidism, unspecified: Secondary | ICD-10-CM

## 2014-05-10 DIAGNOSIS — Z23 Encounter for immunization: Secondary | ICD-10-CM

## 2014-05-10 NOTE — Addendum Note (Signed)
Addended by: Zannie Cove on: 05/10/2014 09:30 AM   Modules accepted: Orders

## 2014-05-10 NOTE — Progress Notes (Signed)
Subjective:    Patient ID: Marissa Perez, female    DOB: 10-16-1945, 68 y.o.   MRN: [id number]  HPI Pt here for follow up and management of chronic medical problems. Recent labs done will be reviewed with the patient during the visit. Everything was good and within normal limits overall. The patient is due to get her flu shot today. She is having some issues with her stomach. The patient continues to see the orthopedist about her heel problems and that he is doing some better. She is not taking a lot of meloxicam at the present.       Patient Active Problem List   Diagnosis Date Noted  . Hypothyroidism 12/11/2012  . Hyperlipidemia 12/11/2012  . Hypertension 12/11/2012  . GERD (gastroesophageal reflux disease) 12/11/2012   Outpatient Encounter Prescriptions as of 05/10/2014  Medication Sig  . aspirin (ASPIRIN LOW DOSE) 81 MG EC tablet Take 81 mg by mouth daily.    [name] Kitchen atorvastatin (LIPITOR) 20 MG tablet TAKE 1 TABLET ONCE A DAY  . BIOTIN PO Take by mouth.    . Calcium Citrate-Vitamin D (CALCIUM CITRATE + D3 MAXIMUM) 315-250 MG-UNIT TABS Take 1 tablet by mouth 2 (two) times daily.  . Cholecalciferol (VITAMIN D) 2000 UNITS CAPS Take by mouth daily.    . clobetasol (TEMOVATE) 0.05 % external solution   . conjugated estrogens (PREMARIN) vaginal cream USE 1 GM VAGINALLY 2 TO 3 TIMES A WEEK  . ezetimibe (ZETIA) 10 MG tablet Take 1 tablet (10 mg total) by mouth daily.  [name] Kitchen levothyroxine (SYNTHROID, LEVOTHROID) 50 MCG tablet Take 1 tablet (50 mcg total) by mouth as directed. Take one half tablet by mouth Monday, Wednesday and Friday. And take one tablet by mouth Tuesday, Thursday, Saturday and Sunday  . Magnesium 100 MG CAPS Take 1 capsule by mouth daily.  . meloxicam (MOBIC) 15 MG tablet Take 1 tablet by mouth daily.  . Multiple Vitamin (MULTI-VITAMIN PO) Take by mouth daily.    [name] Kitchen OVER THE COUNTER MEDICATION Take 4 capsules by mouth daily. JR Carlson's Elite Omega Fish oil  . ranitidine  (ZANTAC) 150 MG capsule Take 150 mg by mouth daily.  . valsartan (DIOVAN) 320 MG tablet 1 daily as directed    Review of Systems  Constitutional: Negative.   HENT: Negative.   Eyes: Negative.   Respiratory: Negative.   Cardiovascular: Negative.   Gastrointestinal: Negative.        Upset stomach at times  Endocrine: Negative.   Genitourinary: Negative.   Musculoskeletal: Negative.   Skin: Negative.   Allergic/Immunologic: Negative.   Neurological: Negative.   Hematological: Negative.   Psychiatric/Behavioral: Negative.        Objective:   Physical Exam  Nursing note and vitals reviewed. Constitutional: She is oriented to person, place, and time. She appears well-developed and well-nourished. No distress.  HENT:  Head: Normocephalic and atraumatic.  Right Ear: External ear normal.  Left Ear: External ear normal.  Nose: Nose normal.  Mouth/Throat: Oropharynx is clear and moist.  Eyes: Conjunctivae and EOM are normal. Pupils are equal, round, and reactive to light. Right eye exhibits no discharge. Left eye exhibits no discharge. No scleral icterus.  Neck: Normal range of motion. Neck supple. No thyromegaly present.  Cardiovascular: Normal rate, regular rhythm, normal heart sounds and intact distal pulses.  Exam reveals no gallop and no friction rub.   No murmur heard. At 84 per minute  Pulmonary/Chest: Effort normal and breath sounds normal. No respiratory distress.  She has no wheezes. She has no rales. She exhibits no tenderness.  Abdominal: Soft. Bowel sounds are normal. She exhibits no mass. There is no tenderness. There is no rebound and no guarding.  Musculoskeletal: Normal range of motion. She exhibits no edema.  Lymphadenopathy:    She has no cervical adenopathy.  Neurological: She is alert and oriented to person, place, and time. She has normal reflexes.  Skin: Skin is warm and dry. No rash noted.  Psychiatric: She has a normal mood and affect. Her behavior is normal.  Judgment and thought content normal.   BP 98/66  Pulse 86  Temp(Src) 97 F (36.1 C) (Oral)  Ht 5\' 3"  (1.6 m)  Wt 141 lb (63.957 kg)  BMI 24.98 kg/m2        Assessment & Plan:  1. Gastroesophageal reflux disease, esophagitis presence not specified  2. Hyperlipidemia  3. Essential hypertension  4. Hypothyroidism, unspecified hypothyroidism type  5. Need for prophylactic vaccination and inoculation against influenza  6. IBS (irritable bowel syndrome)  Patient Instructions                       Medicare Annual Wellness Visit  [place] and the medical providers at [facility] strive to bring you the best medical care.  In doing so we not only want to address your current medical conditions and concerns but also to detect new conditions early and prevent illness, disease and health-related problems.    Medicare offers a yearly Wellness Visit which allows our clinical staff to assess your need for preventative services including immunizations, lifestyle education, counseling to decrease risk of preventable diseases and screening for fall risk and other medical concerns.    This visit is provided free of charge (no copay) for all Medicare recipients. The clinical pharmacists at [facility] have begun to conduct these Wellness Visits which will also include a thorough review of all your medications.    As you primary medical provider recommend that you make an appointment for your Annual Wellness Visit if you have not done so already this year.  You may set up this appointment before you leave today or you may call back ([phone]) and schedule an appointment.  Please make sure when you call that you mention that you are scheduling your Annual Wellness Visit with the clinical pharmacist so that the appointment may be made for the proper length of time.     Continue current medications. Continue good therapeutic lifestyle changes which  include good diet and exercise. Fall precautions discussed with patient. If an FOBT was given today- please return it to our front desk. If you are over 91 years old - you may need Prevnar 39 or the adult Pneumonia vaccine.  Flu Shots will be available at our office starting mid- September. Please call and schedule a FLU CLINIC APPOINTMENT.   Continue to follow the directions of the orthopedist Refrain from anti-inflammatory medicines as much as possible Continue to be careful and not fall Try the Align,  over-the-counter 1 daily Also watch your diet and refrain from milk cheese ice cream dairy products and caffeine as much as possible   Arrie Senate MD

## 2014-05-10 NOTE — Patient Instructions (Addendum)
Medicare Annual Wellness Visit  [facility] and the medical providers at [facility] strive to bring you the best medical care.  In doing so we not only want to address your current medical conditions and concerns but also to detect new conditions early and prevent illness, disease and health-related problems.    Medicare offers a yearly Wellness Visit which allows our clinical staff to assess your need for preventative services including immunizations, lifestyle education, counseling to decrease risk of preventable diseases and screening for fall risk and other medical concerns.    This visit is provided free of charge (no copay) for all Medicare recipients. The clinical pharmacists at [facility] have begun to conduct these Wellness Visits which will also include a thorough review of all your medications.    As you primary medical provider recommend that you make an appointment for your Annual Wellness Visit if you have not done so already this year.  You may set up this appointment before you leave today or you may call back [phone]) and schedule an appointment.  Please make sure when you call that you mention that you are scheduling your Annual Wellness Visit with the clinical pharmacist so that the appointment may be made for the proper length of time.     Continue current medications. Continue good therapeutic lifestyle changes which include good diet and exercise. Fall precautions discussed with patient. If an FOBT was given today- please return it to our front desk. If you are over 32 years old - you may need Prevnar 47 or the adult Pneumonia vaccine.  Flu Shots will be available at our office starting mid- September. Please call and schedule a FLU CLINIC APPOINTMENT.   Continue to follow the directions of the orthopedist Refrain from anti-inflammatory medicines as much as possible Continue to be careful and not  fall Try the Align,  over-the-counter 1 daily Also watch your diet and refrain from milk cheese ice cream dairy products and caffeine as much as possible

## 2014-05-15 ENCOUNTER — Encounter: Payer: Self-pay | Admitting: Family Medicine

## 2014-05-16 ENCOUNTER — Telehealth: Payer: Self-pay | Admitting: Family Medicine

## 2014-05-17 NOTE — Telephone Encounter (Signed)
done

## 2014-09-02 ENCOUNTER — Other Ambulatory Visit: Payer: Self-pay

## 2014-09-02 DIAGNOSIS — Z1231 Encounter for screening mammogram for malignant neoplasm of breast: Secondary | ICD-10-CM

## 2014-09-04 ENCOUNTER — Other Ambulatory Visit: Payer: Self-pay | Admitting: Family Medicine

## 2014-09-09 ENCOUNTER — Other Ambulatory Visit (INDEPENDENT_AMBULATORY_CARE_PROVIDER_SITE_OTHER): Payer: Medicare Other

## 2014-09-09 DIAGNOSIS — M25571 Pain in right ankle and joints of right foot: Secondary | ICD-10-CM

## 2014-09-09 DIAGNOSIS — I10 Essential (primary) hypertension: Secondary | ICD-10-CM

## 2014-09-09 DIAGNOSIS — K219 Gastro-esophageal reflux disease without esophagitis: Secondary | ICD-10-CM

## 2014-09-09 DIAGNOSIS — E559 Vitamin D deficiency, unspecified: Secondary | ICD-10-CM

## 2014-09-09 DIAGNOSIS — E785 Hyperlipidemia, unspecified: Secondary | ICD-10-CM

## 2014-09-09 LAB — POCT CBC
GRANULOCYTE PERCENT: 57 % (ref 37–80)
HCT, POC: 38 % (ref 37.7–47.9)
HEMOGLOBIN: 12.2 g/dL (ref 12.2–16.2)
Lymph, poc: 1.7 (ref 0.6–3.4)
MCH, POC: 30.2 pg (ref 27–31.2)
MCHC: 32 g/dL (ref 31.8–35.4)
MCV: 94.3 fL (ref 80–97)
MPV: 7.2 fL (ref 0–99.8)
POC Granulocyte: 2.6 (ref 2–6.9)
POC LYMPH PERCENT: 36.7 %L (ref 10–50)
Platelet Count, POC: 391 10*3/uL (ref 142–424)
RBC: 4 M/uL — AB (ref 4.04–5.48)
RDW, POC: 14.2 %
WBC: 4.6 10*3/uL (ref 4.6–10.2)

## 2014-09-09 NOTE — Progress Notes (Signed)
Lab only 

## 2014-09-10 LAB — BMP8+EGFR
BUN / CREAT RATIO: 15 (ref 11–26)
BUN: 10 mg/dL (ref 8–27)
CO2: 25 mmol/L (ref 18–29)
CREATININE: 0.67 mg/dL (ref 0.57–1.00)
Calcium: 9.8 mg/dL (ref 8.7–10.3)
Chloride: 101 mmol/L (ref 97–108)
GFR, EST AFRICAN AMERICAN: 104 mL/min/{1.73_m2} (ref >59)
GFR, EST NON AFRICAN AMERICAN: 91 mL/min/{1.73_m2} (ref >59)
Glucose: 96 mg/dL (ref 65–99)
Potassium: 4.6 mmol/L (ref 3.5–5.2)
SODIUM: 144 mmol/L (ref 134–144)

## 2014-09-10 LAB — HEPATIC FUNCTION PANEL
ALK PHOS: 65 IU/L (ref 39–117)
ALT: 31 IU/L (ref 0–32)
AST: 25 IU/L (ref 0–40)
Albumin: 4.8 g/dL (ref 3.6–4.8)
BILIRUBIN TOTAL: 0.2 mg/dL (ref 0.0–1.2)
BILIRUBIN, DIRECT: 0.07 mg/dL (ref 0.00–0.40)
Total Protein: 7.1 g/dL (ref 6.0–8.5)

## 2014-09-10 LAB — VITAMIN D 25 HYDROXY (VIT D DEFICIENCY, FRACTURES): VIT D 25 HYDROXY: 43.6 ng/mL (ref 30.0–100.0)

## 2014-09-10 LAB — NMR, LIPOPROFILE
Cholesterol: 183 mg/dL (ref 100–199)
HDL Cholesterol by NMR: 68 mg/dL (ref >39)
HDL Particle Number: 49.1 umol/L (ref >=30.5)
LDL Particle Number: 1352 nmol/L — ABNORMAL HIGH (ref <1000)
LDL SIZE: 20.4 nm (ref >20.5)
LDL-C: 91 mg/dL (ref 0–99)
LP-IR Score: 69 — ABNORMAL HIGH (ref <=45)
SMALL LDL PARTICLE NUMBER: 767 nmol/L — AB (ref <=527)
Triglycerides by NMR: 121 mg/dL (ref 0–149)

## 2014-09-11 ENCOUNTER — Telehealth: Payer: Self-pay | Admitting: *Deleted

## 2014-09-11 NOTE — Telephone Encounter (Signed)
-----   Message from Chipper Herb, MD sent at 09/10/2014  2:16 PM EST ----- Blood sugar, creatinine and electrolytes are all within normal limits. All liver function tests are within normal limits Cholesterol numbers with advanced lipid testing have a total LDL particle number that is much more elevated than 4 months ago. It is now 1352 previously it was 866. The LDL C is good at 91. The triglycerides remain good at 121.----- please confirm with the patient that she is still taking her cholesterol medicine, watching her diet and getting as much exercise as possible.++++++ The vitamin D level is good and stable at 43.6, continue current treatment

## 2014-09-11 NOTE — Telephone Encounter (Signed)
lmtcb 2/10-kc

## 2014-09-12 ENCOUNTER — Encounter: Payer: Self-pay | Admitting: Family Medicine

## 2014-09-24 ENCOUNTER — Ambulatory Visit (INDEPENDENT_AMBULATORY_CARE_PROVIDER_SITE_OTHER): Payer: Medicare Other | Admitting: Family Medicine

## 2014-09-24 ENCOUNTER — Encounter: Payer: Self-pay | Admitting: Family Medicine

## 2014-09-24 VITALS — BP 123/77 | HR 75 | Temp 96.9°F | Ht 63.0 in | Wt 142.0 lb

## 2014-09-24 DIAGNOSIS — M25551 Pain in right hip: Secondary | ICD-10-CM

## 2014-09-24 DIAGNOSIS — K219 Gastro-esophageal reflux disease without esophagitis: Secondary | ICD-10-CM

## 2014-09-24 DIAGNOSIS — I10 Essential (primary) hypertension: Secondary | ICD-10-CM

## 2014-09-24 DIAGNOSIS — E039 Hypothyroidism, unspecified: Secondary | ICD-10-CM

## 2014-09-24 DIAGNOSIS — E785 Hyperlipidemia, unspecified: Secondary | ICD-10-CM

## 2014-09-24 DIAGNOSIS — E559 Vitamin D deficiency, unspecified: Secondary | ICD-10-CM

## 2014-09-24 MED ORDER — ATORVASTATIN CALCIUM 20 MG PO TABS: 20.0000 mg | ORAL_TABLET | Freq: Every day | ORAL | Status: DC

## 2014-09-24 NOTE — Progress Notes (Signed)
Subjective:    Patient ID: Marissa Perez, female    DOB: 23-Jan-1946, 68 y.o.   MRN: [id number]  HPI Pt here for follow up and management of chronic medical problems which includes hypothyroid and hyperlipidemia. She is taking medications regularly. Recent lab work done on the patient will be reviewed with her today. All the lab numbers were good except her cholesterol has increased significantly. The lab work includes a CBC with a normal white blood cell count and a good hemoglobin. All the liver function tests are within normal limits. The vitamin D level is good. Cholesterol numbers with advanced lipid testing have a total LDL particle number that is significantly elevated from past readings. Liver function tests and kidney function tests were good. The patient is due to get her pelvic and Pap smear and to return an FOBT. She also is requesting a refill on her cholesterol medicine. The patient indicates that her hip sometimes hurts at nighttime when she is in the bed. She denies chest pain or shortness of breath and indicates that taking the proton pump inhibitor has caused some loose bowel movements but she has to take this because of her chronic reflux problems.        Patient Active Problem List   Diagnosis Date Noted  . Hypothyroidism 12/11/2012  . Hyperlipidemia 12/11/2012  . Hypertension 12/11/2012  . GERD (gastroesophageal reflux disease) 12/11/2012   Outpatient Encounter Prescriptions as of 09/24/2014  Medication Sig  . aspirin (ASPIRIN LOW DOSE) 81 MG EC tablet Take 81 mg by mouth daily.    [name] Kitchen atorvastatin (LIPITOR) 20 MG tablet TAKE 1 TABLET ONCE A DAY  . BIOTIN PO Take by mouth.    . Calcium Citrate-Vitamin D (CALCIUM CITRATE + D3 MAXIMUM) 315-250 MG-UNIT TABS Take 1 tablet by mouth 2 (two) times daily.  . Cholecalciferol (VITAMIN D) 2000 UNITS CAPS Take by mouth daily.    [name] Kitchen conjugated estrogens (PREMARIN) vaginal cream USE 1 GM VAGINALLY 2 TO 3 TIMES A WEEK  . esomeprazole  (NEXIUM) 20 MG capsule Take 20 mg by mouth daily at 12 noon.  [name] Kitchen levothyroxine (SYNTHROID, LEVOTHROID) 50 MCG tablet Take 1 tablet (50 mcg total) by mouth as directed. Take one half tablet by mouth Monday, Wednesday and Friday. And take one tablet by mouth Tuesday, Thursday, Saturday and Sunday  . Magnesium 100 MG CAPS Take 1 capsule by mouth daily.  . meloxicam (MOBIC) 15 MG tablet Take 1 tablet by mouth daily.  . Multiple Vitamin (MULTI-VITAMIN PO) Take by mouth daily.    [name] Kitchen OVER THE COUNTER MEDICATION Take 4 capsules by mouth daily. JR Carlson's Elite Omega Fish oil  . valsartan (DIOVAN) 320 MG tablet 1 daily as directed  . ZETIA 10 MG tablet TAKE 1 TABLET DAILY  . [DISCONTINUED] ranitidine (ZANTAC) 150 MG capsule Take 150 mg by mouth daily.  . clobetasol (TEMOVATE) 0.05 % external solution     Review of Systems  Constitutional: Negative.   HENT: Negative.   Eyes: Negative.   Respiratory: Negative.   Cardiovascular: Negative.   Gastrointestinal: Negative.   Endocrine: Negative.   Genitourinary: Negative.   Musculoskeletal: Positive for arthralgias (right hip pain ).  Skin: Negative.   Allergic/Immunologic: Negative.   Neurological: Negative.   Hematological: Negative.   Psychiatric/Behavioral: Negative.        Objective:   Physical Exam  Constitutional: She is oriented to person, place, and time. She appears well-developed and well-nourished. No distress.  HENT:  Head: Normocephalic  and atraumatic.  Right Ear: External ear normal.  Left Ear: External ear normal.  Mouth/Throat: Oropharynx is clear and moist.  There is some nasal congestion bilaterally  Eyes: Conjunctivae and EOM are normal. Pupils are equal, round, and reactive to light. Right eye exhibits no discharge. Left eye exhibits no discharge. No scleral icterus.  Neck: Normal range of motion. Neck supple. No thyromegaly present.  No carotid bruits or anterior cervical adenopathy  Cardiovascular: Normal rate, regular  rhythm, normal heart sounds and intact distal pulses.   No murmur heard. At 84/m  Pulmonary/Chest: Effort normal and breath sounds normal. No respiratory distress. She has no wheezes. She has no rales.  No axillary adenopathy  Abdominal: Soft. Bowel sounds are normal. She exhibits no mass. There is no tenderness. There is no rebound and no guarding.  There is no epigastric tenderness  Musculoskeletal: Normal range of motion. She exhibits no edema or tenderness.  Leg raising and hip abduction were good bilaterally except there was some posterior right thigh pain with hip abduction on the right and this is somewhat of what she experiences at home.  Lymphadenopathy:    She has no cervical adenopathy.  Neurological: She is alert and oriented to person, place, and time. She has normal reflexes. No cranial nerve deficit.  Skin: Skin is warm and dry. No rash noted.  Psychiatric: She has a normal mood and affect. Her behavior is normal. Judgment and thought content normal.  Nursing note and vitals reviewed.  BP 123/77 mmHg  Pulse 75  Temp(Src) 96.9 F (36.1 C) (Oral)  Ht 5\' 3"  (1.6 m)  Wt 142 lb (64.411 kg)  BMI 25.16 kg/m2        Assessment & Plan:  1. Gastroesophageal reflux disease, esophagitis presence not specified -The patient is having no problems with this other than some loose bowel movements and she should continue with her Nexium.  2. Hyperlipidemia -The patient indicates that her diet has not been the greatest recently is rested with the holiday season and Super Bowl etc. and she will try to do better with diet and exercise.  3. Essential hypertension -Blood pressure is good and she should continue with her current treatment  4. Hypothyroidism, unspecified hypothyroidism type -No change in treatment and no symptoms related to her thyroid.  5. Vitamin D deficiency -The recent vitamin D was good and she should continue with the current vitamin D treatment  6. Right hip  pain -Take meloxicam more regularly for the next 2-3 weeks and use warm wet compresses to the low back and right hip -If problems continue we will plan to get an LS spine film and right hip films  Meds ordered this encounter  Medications  . esomeprazole (NEXIUM) 20 MG capsule    Sig: Take 20 mg by mouth daily at 12 noon.  [name] Kitchen atorvastatin (LIPITOR) 20 MG tablet    Sig: Take 1 tablet (20 mg total) by mouth daily.    Dispense:  30 tablet    Refill:  6   Patient Instructions                       Medicare Annual Wellness Visit  [facility] and the medical providers at [facility] strive to bring you the best medical care.  In doing so we not only want to address your current medical conditions and concerns but also to detect new conditions early and prevent illness, disease and health-related problems.  Medicare offers a yearly Wellness Visit which allows our clinical staff to assess your need for preventative services including immunizations, lifestyle education, counseling to decrease risk of preventable diseases and screening for fall risk and other medical concerns.    This visit is provided free of charge (no copay) for all Medicare recipients. The clinical pharmacists at [facility] have begun to conduct these Wellness Visits which will also include a thorough review of all your medications.    As you primary medical provider recommend that you make an appointment for your Annual Wellness Visit if you have not done so already this year.  You may set up this appointment before you leave today or you may call back ([phone]) and schedule an appointment.  Please make sure when you call that you mention that you are scheduling your Annual Wellness Visit with the clinical pharmacist so that the appointment may be made for the proper length of time.     Continue current medications. Continue good therapeutic lifestyle changes which include good  diet and exercise. Fall precautions discussed with patient. If an FOBT was given today- please return it to our front desk. If you are over 48 years old - you may need Prevnar 35 or the adult Pneumonia vaccine.  Flu Shots are still available at our office. If you still haven't had one please call to set up a nurse visit to get one.   After your visit with [place] today you will receive a survey in the mail or online from [employer] regarding your care with [place]. Please take a moment to fill this out. Your feedback is very important to [place] as you can help [place] better understand your patient needs as well as improve your experience and satisfaction. WE CARE ABOUT YOU!!!   Take the meloxicam a little bit more regularly for the next 2-3 weeks, make sure you take it after eating. Do home exercises with leg raises and hip rotations while laying on the back. Use warm wet compresses to the low back and right hip If pain continues return to clinic for LS spine and right hip films   Arrie Senate MD

## 2014-09-24 NOTE — Patient Instructions (Addendum)
Medicare Annual Wellness Visit  [facility] and the medical providers at [facility] strive to bring you the best medical care.  In doing so we not only want to address your current medical conditions and concerns but also to detect new conditions early and prevent illness, disease and health-related problems.    Medicare offers a yearly Wellness Visit which allows our clinical staff to assess your need for preventative services including immunizations, lifestyle education, counseling to decrease risk of preventable diseases and screening for fall risk and other medical concerns.    This visit is provided free of charge (no copay) for all Medicare recipients. The clinical pharmacists at [facility] have begun to conduct these Wellness Visits which will also include a thorough review of all your medications.    As you primary medical provider recommend that you make an appointment for your Annual Wellness Visit if you have not done so already this year.  You may set up this appointment before you leave today or you may call back ([phone]) and schedule an appointment.  Please make sure when you call that you mention that you are scheduling your Annual Wellness Visit with the clinical pharmacist so that the appointment may be made for the proper length of time.     Continue current medications. Continue good therapeutic lifestyle changes which include good diet and exercise. Fall precautions discussed with patient. If an FOBT was given today- please return it to our front desk. If you are over 71 years old - you may need Prevnar 63 or the adult Pneumonia vaccine.  Flu Shots are still available at our office. If you still haven't had one please call to set up a nurse visit to get one.   After your visit with [place] today you will receive a survey in the mail or online from [employer] regarding your care with [place]. Please take a moment to  fill this out. Your feedback is very important to [place] as you can help [place] better understand your patient needs as well as improve your experience and satisfaction. WE CARE ABOUT YOU!!!   Take the meloxicam a little bit more regularly for the next 2-3 weeks, make sure you take it after eating. Do home exercises with leg raises and hip rotations while laying on the back. Use warm wet compresses to the low back and right hip If pain continues return to clinic for LS spine and right hip films

## 2014-09-25 ENCOUNTER — Other Ambulatory Visit (INDEPENDENT_AMBULATORY_CARE_PROVIDER_SITE_OTHER): Payer: Medicare Other

## 2014-09-25 DIAGNOSIS — Z1212 Encounter for screening for malignant neoplasm of rectum: Secondary | ICD-10-CM

## 2014-09-25 NOTE — Progress Notes (Signed)
Lab only 

## 2014-09-27 LAB — FECAL OCCULT BLOOD, IMMUNOCHEMICAL: Fecal Occult Bld: NEGATIVE

## 2014-09-28 ENCOUNTER — Other Ambulatory Visit: Payer: Self-pay | Admitting: Family Medicine

## 2014-10-01 ENCOUNTER — Encounter: Payer: Self-pay | Admitting: Family Medicine

## 2014-10-07 ENCOUNTER — Ambulatory Visit
Admission: RE | Admit: 2014-10-07 | Discharge: 2014-10-07 | Disposition: A | Discharge status: Home / Self Care | Payer: Medicare Other | Source: Ambulatory Visit

## 2014-10-07 DIAGNOSIS — Z1231 Encounter for screening mammogram for malignant neoplasm of breast: Secondary | ICD-10-CM

## 2014-10-15 ENCOUNTER — Encounter: Payer: Self-pay | Admitting: Nurse Practitioner

## 2014-10-15 ENCOUNTER — Ambulatory Visit (INDEPENDENT_AMBULATORY_CARE_PROVIDER_SITE_OTHER): Payer: Medicare Other | Admitting: Nurse Practitioner

## 2014-10-15 DIAGNOSIS — Z01419 Encounter for gynecological examination (general) (routine) without abnormal findings: Secondary | ICD-10-CM

## 2014-10-15 LAB — POCT UA - MICROSCOPIC ONLY
Bacteria, U Microscopic: NEGATIVE
CASTS, UR, LPF, POC: NEGATIVE
CRYSTALS, UR, HPF, POC: NEGATIVE
Mucus, UA: NEGATIVE
RBC, urine, microscopic: NEGATIVE
YEAST UA: NEGATIVE

## 2014-10-15 LAB — POCT URINALYSIS DIPSTICK
Bilirubin, UA: NEGATIVE
GLUCOSE UA: NEGATIVE
Ketones, UA: NEGATIVE
Nitrite, UA: NEGATIVE
Protein, UA: NEGATIVE
RBC UA: NEGATIVE
Urobilinogen, UA: NEGATIVE
pH, UA: 7

## 2014-10-15 NOTE — Addendum Note (Signed)
Addended by: Earlene Plater on: 10/15/2014 02:50 PM   Modules accepted: Miquel Dunn

## 2014-10-15 NOTE — Progress Notes (Signed)
   Subjective:    Patient ID: Marissa Perez, female    DOB: 1945-10-29, 69 y.o.   MRN: [id number]  HPI Regular patient of DR. Laurance Flatten that was seen for follow up of chronic medical problems a couple of weeks ago- SHe is here today for pap and breast exam only. She is doing well without complaints today.    Review of Systems  Constitutional: Negative.   HENT: Negative.   Respiratory: Negative.   Cardiovascular: Negative.   Gastrointestinal: Negative.   Genitourinary: Negative.   Neurological: Negative.   Psychiatric/Behavioral: Negative.   All other systems reviewed and are negative.      Objective:   Physical Exam  Constitutional: She is oriented to person, place, and time. She appears well-developed and well-nourished.  HENT:  Head: Normocephalic.  Right Ear: Hearing, tympanic membrane, external ear and ear canal normal.  Left Ear: Hearing, tympanic membrane, external ear and ear canal normal.  Nose: Nose normal.  Mouth/Throat: Uvula is midline and oropharynx is clear and moist.  Eyes: Conjunctivae and EOM are normal. Pupils are equal, round, and reactive to light.  Neck: Normal range of motion and full passive range of motion without pain. Neck supple. No JVD present. Carotid bruit is not present. No thyroid mass and no thyromegaly present.  Cardiovascular: Normal rate, normal heart sounds and intact distal pulses.   No murmur heard. Pulmonary/Chest: Effort normal and breath sounds normal. Right breast exhibits no inverted nipple, no mass, no nipple discharge, no skin change and no tenderness. Left breast exhibits no inverted nipple, no mass, no nipple discharge, no skin change and no tenderness.  Abdominal: Soft. Bowel sounds are normal. She exhibits no mass. There is no tenderness.  Genitourinary: Vagina normal and uterus normal. No breast swelling, tenderness, discharge or bleeding.  bimanual exam-No adnexal masses or tenderness. Vaginal cuff intact  Musculoskeletal: Normal  range of motion.  Lymphadenopathy:    She has no cervical adenopathy.  Neurological: She is alert and oriented to person, place, and time.  Skin: Skin is warm and dry.  Psychiatric: She has a normal mood and affect. Her behavior is normal. Judgment and thought content normal.   BP 136/71 mmHg  Pulse 71  Temp(Src) 97.1 F (36.2 C) (Oral)  Ht 5\' 3"  (1.6 m)  Wt 143 lb (64.864 kg)  BMI 25.34 kg/m2        Assessment & Plan:   1. Encounter for routine gynecological examination    Continue all meds Keep follow up appointment with Dr. Mayra Neer, FNP

## 2014-10-15 NOTE — Addendum Note (Signed)
Addended by: Chevis Pretty on: 10/15/2014 02:43 PM   Modules accepted: Orders

## 2014-10-17 ENCOUNTER — Other Ambulatory Visit (INDEPENDENT_AMBULATORY_CARE_PROVIDER_SITE_OTHER): Payer: Medicare Other

## 2014-10-17 ENCOUNTER — Telehealth: Payer: Self-pay | Admitting: Family Medicine

## 2014-10-17 DIAGNOSIS — M25551 Pain in right hip: Secondary | ICD-10-CM | POA: Diagnosis not present

## 2014-10-17 LAB — PAP IG (IMAGE GUIDED): PAP SMEAR COMMENT: 0

## 2014-10-17 NOTE — Telephone Encounter (Signed)
Her hip pain is no better and patient wants to know if she needs to be seen or just get xray. Please advise

## 2014-10-17 NOTE — Telephone Encounter (Signed)
Patient aware and will come by for xray

## 2014-10-17 NOTE — Telephone Encounter (Signed)
Have patient come in for x-ray of hip that is bothering her

## 2014-10-23 ENCOUNTER — Other Ambulatory Visit: Payer: Self-pay

## 2014-10-23 DIAGNOSIS — M25551 Pain in right hip: Secondary | ICD-10-CM

## 2014-11-14 ENCOUNTER — Ambulatory Visit: Payer: Medicare Other | Attending: Physician Assistant | Admitting: Physical Therapy

## 2014-11-14 DIAGNOSIS — M5441 Lumbago with sciatica, right side: Secondary | ICD-10-CM | POA: Diagnosis not present

## 2014-11-14 DIAGNOSIS — M5136 Other intervertebral disc degeneration, lumbar region: Secondary | ICD-10-CM | POA: Diagnosis present

## 2014-11-14 NOTE — Therapy (Signed)
[facility] [place], [place], [address] Phone: [phone]   Fax:  [phone]  Physical Therapy Evaluation  Patient Details  Name: SHLONDA DOLLOFF MRN: [id number] Date of Birth: 1945/12/26 Referring Provider:  Jeri Cos, MD  Encounter Date: 11/14/2014      PT End of Session - 11/14/14 1503    Visit Number 1   Number of Visits 12   Date for PT Re-Evaluation 01/09/15   PT Start Time 0231   Activity Tolerance Patient tolerated treatment well   Behavior During Therapy [facility] for tasks assessed/performed      Past Medical History  Diagnosis Date  . IBS (irritable bowel syndrome)   . Hyperlipidemia   . Diverticulosis   . Acid reflux   . Hypothyroidism   . History of ETT 7/10    abnormal   . Hypertension   . History of stomach ulcers 2008    positive h. pylori    Past Surgical History  Procedure Laterality Date  . Cesarean section  1972  . Tonsillectomy  1951  . Total abdominal hysterectomy w/ bilateral salpingoophorectomy  1990    Dr. Tamala Julian / fibroids     There were no vitals filed for this visit.  Visit Diagnosis:  DDD (degenerative disc disease), lumbar - Plan: PT plan of care cert/re-cert  Acute back pain with sciatica, right - Plan: PT plan of care cert/re-cert      Subjective Assessment - 11/14/14 1439    Subjective Prednisone has helped.   Patient Stated Goals Get out of pain.   Currently in Pain? Yes   Pain Score 2    Pain Location Back   Pain Orientation Right   Pain Descriptors / Indicators Aching   Pain Type Chronic pain  Pain recently a 7-8/10.   Pain Onset More than a month ago   Pain Frequency Constant            [facility] PT Assessment - 11/14/14 0001    Assessment   Medical Diagnosis Lumbar DDD   Onset Date --  Several months.   Balance Screen   Has the patient fallen in the past 6 months No   Has the patient had a decrease in activity level because of a fear of falling?  No   Is the patient reluctant to leave their home because of a fear of falling?  No   ROM / Strength   AROM / PROM / Strength --  Full active lumbar ROM.   Palpation   Palpation --  Some tenderness near right SIJ.   Special Tests    Special Tests --  Neg SLR. Neg FABER.  Absent right Pat reflex.                   [facility] Adult PT Treatment/Exercise - 11/14/14 0001    Modalities   Modalities Traction   Traction   Type of Traction Lumbar   Min (lbs) 5   Max (lbs) 60   Hold Time 99   Rest Time 5   Time 15                            Plan - 11/14/14 1506    Clinical Impression Statement Th patient has had ongoing low abck pain over several months with radiationof pain into right buttock, thigh and to foot.  Prednisone has helped a great deal.  Prior to this her pain  would rise to a 7-8/10.   Pt will benefit from skilled therapeutic intervention in order to improve on the following deficits Decreased activity tolerance;Pain   Rehab Potential Excellent   PT Frequency 3x / week   PT Duration 4 weeks   PT Treatment/Interventions ADLs/Self Care Home Management;Therapeutic exercise;Ultrasound;Manual techniques;Electrical Stimulation;Traction   PT Next Visit Plan Traction at 65%.  Core exercises.   Consulted and Agree with Plan of Care Patient          G-Codes - 2014/11/29 1512    Functional Assessment Tool Used Clinical judgement.   Functional Limitation Mobility: Walking and moving around   Mobility: Walking and Moving Around Current Status [phone]) At least 20 percent but less than 40 percent impaired, limited or restricted   Mobility: Walking and Moving Around Goal Status [phone]) At least 1 percent but less than 20 percent impaired, limited or restricted       Problem List Patient Active Problem List   Diagnosis Date Noted  . Hypothyroidism 12/11/2012  . Hyperlipidemia 12/11/2012  . Hypertension 12/11/2012  . GERD (gastroesophageal reflux disease)  12/11/2012    Russell Quinney, [place] 2014-11-29, 3:16 PM  [facility] [address] [place], [place], [address] Phone: [phone]   Fax:  [phone]

## 2014-11-14 NOTE — Therapy (Signed)
[facility] [place], [place], [address] Phone: [phone]   Fax:  [phone]  Physical Therapy Evaluation  Patient Details  Name: Marissa Perez MRN: [id number] Date of Birth: [birth year] Referring Provider:  Jeri Cos, MD  Encounter Date: 11/14/2014      PT End of Session - 11/14/14 1503    Visit Number 1   Number of Visits 12   Date for PT Re-Evaluation 01/09/15   PT Start Time 0231   Activity Tolerance Patient tolerated treatment well   Behavior During Therapy [facility] for tasks assessed/performed      Past Medical History  Diagnosis Date  . IBS (irritable bowel syndrome)   . Hyperlipidemia   . Diverticulosis   . Acid reflux   . Hypothyroidism   . History of ETT 7/10    abnormal   . Hypertension   . History of stomach ulcers 2008    positive h. pylori    Past Surgical History  Procedure Laterality Date  . Cesarean section  1972  . Tonsillectomy  1951  . Total abdominal hysterectomy w/ bilateral salpingoophorectomy  1990    Dr. Tamala Julian / fibroids     There were no vitals filed for this visit.  Visit Diagnosis:  DDD (degenerative disc disease), lumbar - Plan: PT plan of care cert/re-cert  Acute back pain with sciatica, right - Plan: PT plan of care cert/re-cert      Subjective Assessment - 11/14/14 1439    Subjective Prednisone has helped.   Patient Stated Goals Get out of pain.   Currently in Pain? Yes   Pain Score 2    Pain Location Back   Pain Orientation Right   Pain Descriptors / Indicators Aching   Pain Type Chronic pain  Pain recently a 7-8/10.   Pain Onset More than a month ago   Pain Frequency Constant            [facility] PT Assessment - 11/14/14 0001    Assessment   Medical Diagnosis Lumbar DDD   Onset Date --  Several months.   Balance Screen   Has the patient fallen in the past 6 months No   Has the patient had a decrease in activity level because of a fear of falling?  No   Is the patient reluctant to leave their home because of a fear of falling?  No   ROM / Strength   AROM / PROM / Strength --  Full active lumbar ROM.   Palpation   Palpation --  Some tenderness near right SIJ.   Special Tests    Special Tests --  Neg SLR. Neg FABER.  Absent right Pat reflex.                   [facility] Adult PT Treatment/Exercise - 11/14/14 0001    Modalities   Modalities Traction   Traction   Type of Traction Lumbar   Min (lbs) 5   Max (lbs) 60   Hold Time 99   Rest Time 5   Time 15                  PT Short Term Goals - 11/14/14 1516    PT SHORT TERM GOAL #1   Title Ind with initial HEP.   Time 2   Period Weeks   Status New           PT Long Term Goals - 11/14/14 1516  PT LONG TERM GOAL #1   Title Ind with advanced HEP.   Time 4   Period Weeks   Status New   PT LONG TERM GOAL #2   Title Eliminate right LE symptoms.   Time 4   Period Weeks   Status New   PT LONG TERM GOAL #3   Title Perform ADL's with pain not > 2/10.   Time 4   Period Weeks   Status New               Plan - 12/06/14 1506    Clinical Impression Statement Th patient has had ongoing low abck pain over several months with radiationof pain into right buttock, thigh and to foot.  Prednisone has helped a great deal.  Prior to this her pain would rise to a 7-8/10.   Pt will benefit from skilled therapeutic intervention in order to improve on the following deficits Decreased activity tolerance;Pain   Rehab Potential Excellent   PT Frequency 3x / week   PT Duration 4 weeks   PT Treatment/Interventions ADLs/Self Care Home Management;Therapeutic exercise;Ultrasound;Manual techniques;Electrical Stimulation;Traction   PT Next Visit Plan Traction at 65%.  Core exercises.   Consulted and Agree with Plan of Care Patient          G-Codes - 12/06/14 1512    Functional Assessment Tool Used Clinical judgement.   Functional Limitation Mobility: Walking and  moving around   Mobility: Walking and Moving Around Current Status [phone]) At least 20 percent but less than 40 percent impaired, limited or restricted   Mobility: Walking and Moving Around Goal Status [phone]) At least 1 percent but less than 20 percent impaired, limited or restricted       Problem List Patient Active Problem List   Diagnosis Date Noted  . Hypothyroidism 12/11/2012  . Hyperlipidemia 12/11/2012  . Hypertension 12/11/2012  . GERD (gastroesophageal reflux disease) 12/11/2012    Zhi Geier, [place] 2014-12-06, 3:17 PM  [facility] [address] [place], [place], [address] Phone: [phone]   Fax:  [phone]

## 2014-11-18 ENCOUNTER — Ambulatory Visit: Payer: Medicare Other | Admitting: Physical Therapy

## 2014-11-18 DIAGNOSIS — M5441 Lumbago with sciatica, right side: Secondary | ICD-10-CM

## 2014-11-18 DIAGNOSIS — M5136 Other intervertebral disc degeneration, lumbar region: Secondary | ICD-10-CM

## 2014-11-18 NOTE — Therapy (Signed)
[facility] [place], [place], [address] Phone: [phone]   Fax:  [phone]  Physical Therapy Treatment  Patient Details  Name: Marissa Perez MRN: [id number] Date of Birth: 05/14/1946 Referring Provider:  Chipper Herb, MD  Encounter Date: 11/18/2014      PT End of Session - 11/18/14 0820    Visit Number 2   Number of Visits 12   Date for PT Re-Evaluation 01/09/15   PT Start Time 0815   PT Stop Time 0908   PT Time Calculation (min) 53 min   Activity Tolerance Patient tolerated treatment well   Behavior During Therapy [facility] for tasks assessed/performed      Past Medical History  Diagnosis Date  . IBS (irritable bowel syndrome)   . Hyperlipidemia   . Diverticulosis   . Acid reflux   . Hypothyroidism   . History of ETT 7/10    abnormal   . Hypertension   . History of stomach ulcers 2008    positive h. pylori    Past Surgical History  Procedure Laterality Date  . Cesarean section  1972  . Tonsillectomy  1951  . Total abdominal hysterectomy w/ bilateral salpingoophorectomy  1990    Dr. Tamala Julian / fibroids     There were no vitals filed for this visit.  Visit Diagnosis:  DDD (degenerative disc disease), lumbar  Acute back pain with sciatica, right      Subjective Assessment - 11/18/14 0817    Subjective Patient states that she had pain over the weekend intermittently. Also had pain with stiffness this morning upon waking which she said has diminished following walking this morning. Reported some soreness following core exercises. States that while she is completing exercises she does well and then following the exercises she has soreness.   Patient Stated Goals Get out of pain.   Currently in Pain? Yes  0.5/10 per patient reports.   Pain Location Back   Pain Orientation Right   Pain Descriptors / Indicators Aching   Pain Type Chronic pain   Pain Onset More than a month ago   Pain Frequency  Intermittent   Aggravating Factors  Activity   Pain Relieving Factors Rest, Tylenol            [facility] PT Assessment - 11/18/14 0001    Assessment   Medical Diagnosis Lumbar DDD                   [facility] Adult PT Treatment/Exercise - 11/18/14 0001    Exercises   Exercises Lumbar   Lumbar Exercises: Aerobic   Stationary Bike NuStep L5, seat 7 x 10 min with drawin and posture awareness. UE/LE use   Lumbar Exercises: Supine   Bridge 20 reps;2 seconds  with drawin   Straight Leg Raise 20 reps;2 seconds  with drawin; bilaterally   Other Supine Lumbar Exercises Supine Marching x 20 reps with drawin  Slight pinch in back with L hip flexion   Modalities   Modalities Traction   Traction   Type of Traction Lumbar   Min (lbs) 5   Max (lbs) 65  Increased max lbs per MPT   Hold Time 99   Rest Time 5   Time 15                  PT Short Term Goals - 11/18/14 0857    PT SHORT TERM GOAL #1   Title Ind with initial HEP.  Time 2   Period Weeks   Status On-going           PT Long Term Goals - 11/18/14 0857    PT LONG TERM GOAL #1   Title Ind with advanced HEP.   Time 4   Period Weeks   Status On-going   PT LONG TERM GOAL #2   Title Eliminate right LE symptoms.   Time 4   Period Weeks   Status On-going  Patient reports that over the weekend (4/16-4/17) that pain was only in low back and not into RLE.   PT LONG TERM GOAL #3   Status On-going               Plan - 11/18/14 1003    Clinical Impression Statement Patient tolerated treatment well today with core exercises initiated. Experienced some soreness following therapeutic exercise. Tolerated traction well.  All goals considered on-going. Experienced 2/10 pain in R low back that occured intermittantly.   Pt will benefit from skilled therapeutic intervention in order to improve on the following deficits Decreased activity tolerance;Pain   Rehab Potential Excellent   PT Frequency 3x / week    PT Duration 4 weeks   PT Treatment/Interventions ADLs/Self Care Home Management;Therapeutic exercise;Ultrasound;Manual techniques;Electrical Stimulation;Traction   PT Next Visit Plan Continue per PT POC. Continue core exercises as symptoms dictate as well as modalities. Consider core HEP next treatment if no pain reported next treatment.   Consulted and Agree with Plan of Care Patient        Problem List Patient Active Problem List   Diagnosis Date Noted  . Hypothyroidism 12/11/2012  . Hyperlipidemia 12/11/2012  . Hypertension 12/11/2012  . GERD (gastroesophageal reflux disease) 12/11/2012    Wynelle Fanny, PTA 11/18/2014, 9:12 AM  [facility] [place], [place], [address] Phone: [phone]   Fax:  [phone]

## 2014-11-21 ENCOUNTER — Encounter: Payer: Self-pay | Admitting: Physical Therapy

## 2014-11-21 ENCOUNTER — Ambulatory Visit: Payer: Medicare Other | Admitting: Physical Therapy

## 2014-11-21 DIAGNOSIS — M5136 Other intervertebral disc degeneration, lumbar region: Secondary | ICD-10-CM | POA: Diagnosis not present

## 2014-11-21 DIAGNOSIS — M5441 Lumbago with sciatica, right side: Secondary | ICD-10-CM

## 2014-11-21 NOTE — Therapy (Signed)
[facility] [place], [place], [address] Phone: [phone]   Fax:  [phone]  Physical Therapy Treatment  Patient Details  Name: Marissa Perez MRN: [id number] Date of Birth: 1946-07-29 Referring Provider:  Chipper Herb, MD  Encounter Date: 11/21/2014      PT End of Session - 11/21/14 1307    Visit Number 3   Number of Visits 12   Date for PT Re-Evaluation 01/09/15   PT Start Time 1301   PT Stop Time 1350   PT Time Calculation (min) 49 min   Activity Tolerance Patient tolerated treatment well   Behavior During Therapy [facility] for tasks assessed/performed      Past Medical History  Diagnosis Date  . IBS (irritable bowel syndrome)   . Hyperlipidemia   . Diverticulosis   . Acid reflux   . Hypothyroidism   . History of ETT 7/10    abnormal   . Hypertension   . History of stomach ulcers 2008    positive h. pylori    Past Surgical History  Procedure Laterality Date  . Cesarean section  1972  . Tonsillectomy  1951  . Total abdominal hysterectomy w/ bilateral salpingoophorectomy  1990    Dr. Tamala Julian / fibroids     There were no vitals filed for this visit.  Visit Diagnosis:  DDD (degenerative disc disease), lumbar  Acute back pain with sciatica, right      Subjective Assessment - 11/21/14 1303    Subjective Patient states that yesterday was not a "perticularly good day." States that she was going good today and then she swept in her home and sat for lunch and felt a dull ache in her back. No LE symptoms today. Patient reports that she completed the exercises that was completed in previous PT treatment at home Wednesday and had no issues with completing them 10x twice that day.   Patient Stated Goals Get out of pain.   Currently in Pain? No/denies            [facility] PT Assessment - 11/21/14 0001    Assessment   Medical Diagnosis Lumbar DDD                     [facility] Adult PT  Treatment/Exercise - 11/21/14 0001    Lumbar Exercises: Aerobic   Stationary Bike NuStep L5, seat 7 x71min with drawin and posture awareness   Lumbar Exercises: Supine   Bridge 20 reps;2 seconds  with drawin   Straight Leg Raise 20 reps;2 seconds  with drawin; bilaterally   Other Supine Lumbar Exercises Supine Marching x 20 reps with drawin   Modalities   Modalities Traction   Traction   Type of Traction Lumbar   Min (lbs) 5   Max (lbs) 70  Max increased per MPT    Hold Time 99   Rest Time 5   Time 15                PT Education - 11/21/14 1315    Education provided Yes   Education Details HEP- briding, SLR, marching   Person(s) Educated Patient   Methods Explanation;Demonstration;Handout   Comprehension Verbalized understanding;Returned demonstration          PT Short Term Goals - 11/21/14 1308    PT SHORT TERM GOAL #1   Title Ind with initial HEP.   Time 2   Period Weeks   Status On-going  PT Long Term Goals - 11/21/14 1307    PT LONG TERM GOAL #1   Title Ind with advanced HEP.   Time 4   Period Weeks   Status On-going   PT LONG TERM GOAL #2   Title Eliminate right LE symptoms.   Time 4   Period Weeks   Status On-going  Patient reports that over the weekend (4/16-4/17) that pain was only in low back and not into RLE.   PT LONG TERM GOAL #3   Title Perform ADL's with pain not > 2/10.   Time 4   Period Weeks   Status On-going               Plan - 11/21/14 1334    Clinical Impression Statement Patient tolerated treatment well today and denied pain following core exercises. Mechanical lumbar traction max weight increased to 70# per MPT. All goals considered on-going at this time with initial HEP being given today and recent report of pain. Tolerated traction well. Denied pain following treatment only minimal soreness around R low back.   Pt will benefit from skilled therapeutic intervention in order to improve on the following  deficits Decreased activity tolerance;Pain   Rehab Potential Excellent   PT Frequency 3x / week   PT Duration 4 weeks   PT Treatment/Interventions ADLs/Self Care Home Management;Therapeutic exercise;Ultrasound;Manual techniques;Electrical Stimulation;Traction   PT Next Visit Plan Continue per PT POC. Continue core exercises as symptoms dictate as well as modalites.        Problem List Patient Active Problem List   Diagnosis Date Noted  . Hypothyroidism 12/11/2012  . Hyperlipidemia 12/11/2012  . Hypertension 12/11/2012  . GERD (gastroesophageal reflux disease) 12/11/2012    Wynelle Fanny, PTA 11/21/2014, 2:34 PM  [facility] [address] [place], [place], [address] Phone: [phone]   Fax:  [phone]

## 2014-11-21 NOTE — Patient Instructions (Signed)
Bridging   Slowly raise buttocks from floor, keeping stomach tight. Repeat __10__ times per set. Do _1-2___ sets per session. Do _2___ sessions per day.  http://orth.exer.us/1096   Copyright  VHI. All rights reserved.  Straight Leg Raise   Tighten stomach and slowly raise locked right leg _5-6___ inches from floor. Repeat _10___ times per set. Do _1-2___ sets per session. Do _2___ sessions per day.  http://orth.exer.us/1102   Copyright  VHI. All rights reserved.  Bent Leg Lift (Hook-Lying)   Tighten stomach and slowly raise right leg _3-4___ inches from floor. Keep trunk rigid. Hold __2__ seconds. Repeat _10___ times per set. Do _1-2_ sets per session. Do __2__ sessions per day.  http://orth.exer.us/1090   Copyright  VHI. All rights reserved.

## 2014-12-02 ENCOUNTER — Ambulatory Visit: Payer: Medicare Other | Attending: Physician Assistant | Admitting: Physical Therapy

## 2014-12-02 ENCOUNTER — Encounter: Payer: Self-pay | Admitting: Physical Therapy

## 2014-12-02 ENCOUNTER — Other Ambulatory Visit: Payer: Self-pay | Admitting: Family Medicine

## 2014-12-02 DIAGNOSIS — M5136 Other intervertebral disc degeneration, lumbar region: Secondary | ICD-10-CM | POA: Diagnosis present

## 2014-12-02 DIAGNOSIS — M5441 Lumbago with sciatica, right side: Secondary | ICD-10-CM | POA: Insufficient documentation

## 2014-12-02 NOTE — Telephone Encounter (Signed)
Next appt in July

## 2014-12-02 NOTE — Therapy (Signed)
[facility] [place], [place], [address] Phone: [phone]   Fax:  [phone]  Physical Therapy Treatment  Patient Details  Name: Marissa Perez MRN: [id number] Date of Birth: Mar 18, 1946 Referring Provider:  Chipper Herb, MD  Encounter Date: 12/02/2014      PT End of Session - 12/02/14 0913    Visit Number 4   Number of Visits 12   Date for PT Re-Evaluation 01/09/15   PT Start Time 0901   PT Stop Time 0952   PT Time Calculation (min) 51 min   Activity Tolerance Patient tolerated treatment well   Behavior During Therapy [facility] for tasks assessed/performed      Past Medical History  Diagnosis Date  . IBS (irritable bowel syndrome)   . Hyperlipidemia   . Diverticulosis   . Acid reflux   . Hypothyroidism   . History of ETT 7/10    abnormal   . Hypertension   . History of stomach ulcers 2008    positive h. pylori    Past Surgical History  Procedure Laterality Date  . Cesarean section  1972  . Tonsillectomy  1951  . Total abdominal hysterectomy w/ bilateral salpingoophorectomy  1990    Dr. Tamala Julian / fibroids     There were no vitals filed for this visit.  Visit Diagnosis:  DDD (degenerative disc disease), lumbar  Acute back pain with sciatica, right      Subjective Assessment - 12/02/14 0902    Subjective States that she did well on vacation and was able to walk on the beach without complaints of pain. Has to do yard work today in order to clean up following storms.   Patient Stated Goals Get out of pain.   Currently in Pain? Yes   Pain Score 2    Pain Location Back   Pain Orientation Right   Pain Descriptors / Indicators Sore   Pain Type Chronic pain   Pain Onset More than a month ago   Pain Frequency Intermittent            [facility] PT Assessment - 12/02/14 0001    Assessment   Medical Diagnosis Lumbar DDD                     [facility] Adult PT Treatment/Exercise - 12/02/14 0001    Lumbar Exercises: Aerobic   Stationary Bike NuStep L5 x8 min   Lumbar Exercises: Supine   Ab Set 20 reps   Dead Bug 20 reps   Bridge 20 reps;2 seconds   Straight Leg Raise 20 reps;2 seconds;Other (comment)  Bilaterally   Other Supine Lumbar Exercises Supine Marching x 20 reps with drawin   Other Supine Lumbar Exercises Supine ball to lap 2# ball x20 reps   Modalities   Modalities Traction   Traction   Type of Traction Lumbar   Min (lbs) 5   Max (lbs) 75  Increased per MPT   Hold Time 99   Rest Time 5   Time 15                PT Education - 12/02/14 0959    Education provided Yes   Education Details Patient asked regarding stretches she could do before yardwork. Educated on [employer] stretch technqiue and parameters.   Person(s) Educated Patient   Methods Explanation;Demonstration;Verbal cues   Comprehension Verbalized understanding          PT Short Term Goals - 12/02/14 2563  PT SHORT TERM GOAL #1   Title Ind with initial HEP.   Time 2   Period Weeks   Status Achieved           PT Long Term Goals - 12/02/14 0920    PT LONG TERM GOAL #1   Title Ind with advanced HEP.   Time 4   Period Weeks   Status On-going   PT LONG TERM GOAL #2   Title Eliminate right LE symptoms.   Time 4   Period Weeks   Status On-going  Has not had RLE symptoms consistently but had one episode last week.   PT LONG TERM GOAL #3   Title Perform ADL's with pain not > 2/10.   Time 4   Period Weeks   Status Achieved               Plan - 12/02/14 0934    Clinical Impression Statement Patient tolerated treatment well today and had no complaints of low back pain following core exercises only slight cervical pain following ab sets. Mechanical lumbar traction max weight was increased to 75# per MPT. Has achieved ST goal #1 of independence with inital HEP and LT goal #3 of ability to complete ADLs with pain less than 2/10. Tolerated traction well.  Experienced 0.5/10 pain and  some stiffness following treatment.   Pt will benefit from skilled therapeutic intervention in order to improve on the following deficits Decreased activity tolerance;Pain   Rehab Potential Excellent   PT Frequency 3x / week   PT Duration 4 weeks   PT Treatment/Interventions ADLs/Self Care Home Management;Therapeutic exercise;Ultrasound;Manual techniques;Electrical Stimulation;Traction   PT Next Visit Plan Continue per PT POC. Continue core exercises as symptoms dictate as well as modalites.   Consulted and Agree with Plan of Care Patient        Problem List Patient Active Problem List   Diagnosis Date Noted  . Hypothyroidism 12/11/2012  . Hyperlipidemia 12/11/2012  . Hypertension 12/11/2012  . GERD (gastroesophageal reflux disease) 12/11/2012    Wynelle Fanny, PTA 12/02/2014, 10:03 AM  [facility] [address] [place], [place], [address] Phone: [phone]   Fax:  [phone]

## 2014-12-05 ENCOUNTER — Encounter: Payer: Self-pay | Admitting: Physical Therapy

## 2014-12-05 ENCOUNTER — Ambulatory Visit: Payer: Medicare Other | Admitting: Physical Therapy

## 2014-12-05 DIAGNOSIS — M5136 Other intervertebral disc degeneration, lumbar region: Secondary | ICD-10-CM

## 2014-12-05 DIAGNOSIS — M5441 Lumbago with sciatica, right side: Secondary | ICD-10-CM

## 2014-12-05 NOTE — Therapy (Signed)
[facility] [place], [place], [address] Phone: [phone]   Fax:  [phone]  Physical Therapy Treatment  Patient Details  Name: Marissa Perez MRN: [id number] Date of Birth: 06-29-1946 Referring Provider:  Chipper Herb, MD  Encounter Date: 12/05/2014      PT End of Session - 12/05/14 0905    Visit Number 5   Number of Visits 12   Date for PT Re-Evaluation 01/09/15   PT Start Time 0901   PT Stop Time 0948   PT Time Calculation (min) 47 min   Activity Tolerance Patient tolerated treatment well   Behavior During Therapy [facility] for tasks assessed/performed      Past Medical History  Diagnosis Date  . IBS (irritable bowel syndrome)   . Hyperlipidemia   . Diverticulosis   . Acid reflux   . Hypothyroidism   . History of ETT 7/10    abnormal   . Hypertension   . History of stomach ulcers 2008    positive h. pylori    Past Surgical History  Procedure Laterality Date  . Cesarean section  1972  . Tonsillectomy  1951  . Total abdominal hysterectomy w/ bilateral salpingoophorectomy  1990    Dr. Tamala Julian / fibroids     There were no vitals filed for this visit.  Visit Diagnosis:  DDD (degenerative disc disease), lumbar  Acute back pain with sciatica, right      Subjective Assessment - 12/05/14 0903    Subjective States that following picking up debris in her yard her back pain was a 5-6/10 but was not as bad the next day and was good yesterday. States that she thinks the pain had more to do with picking up the yard rather than the increase in traction but did have some delayed pain and discomfort following last treatment.   Patient Stated Goals Get out of pain.   Currently in Pain? Yes   Pain Score 4    Pain Location Back   Pain Orientation Right   Pain Descriptors / Indicators Nagging   Pain Type Chronic pain            [facility] PT Assessment - 12/05/14 0001    Assessment   Medical Diagnosis Lumbar DDD                      [facility] Adult PT Treatment/Exercise - 12/05/14 0001    Lumbar Exercises: Aerobic   Stationary Bike NuStep L5 x8 min   Lumbar Exercises: Supine   Ab Set 20 reps   Dead Bug 20 reps   Bridge 20 reps;2 seconds   Straight Leg Raise 20 reps;2 seconds;Other (comment)  Bilaterally   Other Supine Lumbar Exercises Supine Marching x 20 reps with drawin   Other Supine Lumbar Exercises Supine ball to lap 2# ball x20 reps   Modalities   Modalities Traction   Traction   Type of Traction Lumbar   Min (lbs) 5   Max (lbs) 75   Hold Time 99   Rest Time 5   Time 15                  PT Short Term Goals - 12/05/14 0905    PT SHORT TERM GOAL #1   Title Ind with initial HEP.   Time 2   Period Weeks   Status Achieved           PT Long Term Goals - 12/05/14 7591  PT LONG TERM GOAL #1   Title Ind with advanced HEP.   Time 4   Period Weeks   Status On-going   PT LONG TERM GOAL #2   Title Eliminate right LE symptoms.   Time 4   Period Weeks   Status On-going  Has not had RLE symptoms consistently but had one episode last week.   PT LONG TERM GOAL #3   Title Perform ADL's with pain not > 2/10.   Time 4   Period Weeks   Status Achieved               Plan - 12/05/14 0935    Clinical Impression Statement Patient tolerated treatment well today and had no complaints of low back pain following core exercises. Mechanical lumbar traction max weight was maintained at 75 lbs due to delayed pain and discomfort following last treatment. Continues to tolerate treatment well.  Experienced minimal soreness following today's treatment.   Pt will benefit from skilled therapeutic intervention in order to improve on the following deficits Decreased activity tolerance;Pain   Rehab Potential Excellent   PT Frequency 3x / week   PT Duration 4 weeks   PT Treatment/Interventions ADLs/Self Care Home Management;Therapeutic exercise;Ultrasound;Manual  techniques;Electrical Stimulation;Traction   PT Next Visit Plan Continue per PT POC. Continue core exercises as symptoms dictate as well as modalites.   Consulted and Agree with Plan of Care Patient        Problem List Patient Active Problem List   Diagnosis Date Noted  . Hypothyroidism 12/11/2012  . Hyperlipidemia 12/11/2012  . Hypertension 12/11/2012  . GERD (gastroesophageal reflux disease) 12/11/2012    Wynelle Fanny, PTA 12/05/2014, 9:54 AM  [facility] [address] [place], [place], [address] Phone: [phone]   Fax:  [phone]

## 2014-12-09 ENCOUNTER — Encounter: Payer: Self-pay | Admitting: Physical Therapy

## 2014-12-09 ENCOUNTER — Ambulatory Visit: Payer: Medicare Other | Admitting: Physical Therapy

## 2014-12-09 DIAGNOSIS — M5441 Lumbago with sciatica, right side: Secondary | ICD-10-CM

## 2014-12-09 DIAGNOSIS — M5136 Other intervertebral disc degeneration, lumbar region: Secondary | ICD-10-CM | POA: Diagnosis not present

## 2014-12-09 NOTE — Therapy (Signed)
[facility] [place], [place], [address] Phone: [phone]   Fax:  [phone]  Physical Therapy Treatment  Patient Details  Name: Marissa Perez MRN: [id number] Date of Birth: 1945/10/05 Referring Provider:  Chipper Herb, MD  Encounter Date: 12/09/2014      PT End of Session - 12/09/14 0900    Visit Number 6   Number of Visits 12   Date for PT Re-Evaluation 01/09/15   PT Start Time 0856   PT Stop Time 0945   PT Time Calculation (min) 49 min   Activity Tolerance Patient tolerated treatment well   Behavior During Therapy [facility] for tasks assessed/performed      Past Medical History  Diagnosis Date  . IBS (irritable bowel syndrome)   . Hyperlipidemia   . Diverticulosis   . Acid reflux   . Hypothyroidism   . History of ETT 7/10    abnormal   . Hypertension   . History of stomach ulcers 2008    positive h. pylori    Past Surgical History  Procedure Laterality Date  . Cesarean section  1972  . Tonsillectomy  1951  . Total abdominal hysterectomy w/ bilateral salpingoophorectomy  1990    Dr. Tamala Julian / fibroids     There were no vitals filed for this visit.  Visit Diagnosis:  DDD (degenerative disc disease), lumbar  Acute back pain with sciatica, right      Subjective Assessment - 12/09/14 0858    Subjective States that she had some soreness following last treatment but that passed. Usually takes one Ibuprofen and one advil in those situations. States that today her back 'is not happy' but she was okay during morning walk but is normal to have pain following walking. States that pain increases more after exericses not so much during exercises. Continues to report HEP compliance. States that sometimes she feels like a knuckle is pressing into her back and sometimes its a rubbing sensation.   Patient Stated Goals Get out of pain.   Currently in Pain? Yes   Pain Score 4    Pain Location Back   Pain Orientation  Right;Lower   Pain Descriptors / Indicators Sore;Jabbing   Pain Type Chronic pain   Pain Onset More than a month ago   Pain Frequency Other (Comment)  Soreness is constant; jabbing pain is intermittant   Aggravating Factors  After activity   Pain Relieving Factors Rest, tylenol            [facility] PT Assessment - 12/09/14 0001    Assessment   Medical Diagnosis Lumbar DDD                     [facility] Adult PT Treatment/Exercise - 12/09/14 0001    Lumbar Exercises: Aerobic   Stationary Bike NuStep L5 x8 min   Lumbar Exercises: Supine   Bridge 20 reps;2 seconds   Straight Leg Raise 20 reps;2 seconds;Other (comment)  Bilaterally   Other Supine Lumbar Exercises Supine marching x20 reps   Other Supine Lumbar Exercises Supine ball to lap 2# ball x20 reps, D2 PNF 2# ball bilateral x20 reps   Modalities   Modalities Traction   Traction   Type of Traction Lumbar   Min (lbs) 5   Max (lbs) 75   Hold Time 99   Rest Time 5   Time 15  PT Short Term Goals - 12/09/14 0901    PT SHORT TERM GOAL #1   Title Ind with initial HEP.   Time 2   Period Weeks   Status Achieved           PT Long Term Goals - 12/09/14 0901    PT LONG TERM GOAL #1   Title Ind with advanced HEP.   Time 4   Period Weeks   Status On-going   PT LONG TERM GOAL #2   Title Eliminate right LE symptoms.   Time 4   Period Weeks   Status Achieved   PT LONG TERM GOAL #3   Title Perform ADL's with pain not > 2/10.   Time 4   Period Weeks   Status Achieved               Plan - 12/09/14 0932    Clinical Impression Statement Patient tolerated treatment well with complaint of pain following NuStep. Mechanical lumbar traction max weight was maintained to 75 lbs again today due to reports of increased pain. Achieved LT goal #3 of eliminating RLE symptoms during today's treatment.  Experienced 3/10 pain following today's treatment but still reported the sore R low back  sensation.   Pt will benefit from skilled therapeutic intervention in order to improve on the following deficits Decreased activity tolerance;Pain   Rehab Potential Excellent   PT Frequency 3x / week   PT Duration 4 weeks   PT Treatment/Interventions ADLs/Self Care Home Management;Therapeutic exercise;Ultrasound;Manual techniques;Electrical Stimulation;Traction   PT Next Visit Plan Continue per PT POC. Continue core exercises as symptoms dictate as well as modalites.   Consulted and Agree with Plan of Care Patient        Problem List Patient Active Problem List   Diagnosis Date Noted  . Hypothyroidism 12/11/2012  . Hyperlipidemia 12/11/2012  . Hypertension 12/11/2012  . GERD (gastroesophageal reflux disease) 12/11/2012    Wynelle Fanny, PTA 12/09/2014, 9:49 AM  [facility] [address] [place], [place], [address] Phone: [phone]   Fax:  [phone]

## 2014-12-12 ENCOUNTER — Encounter: Payer: Self-pay | Admitting: Physical Therapy

## 2014-12-12 ENCOUNTER — Ambulatory Visit: Payer: Medicare Other | Admitting: Physical Therapy

## 2014-12-12 DIAGNOSIS — M5136 Other intervertebral disc degeneration, lumbar region: Secondary | ICD-10-CM | POA: Diagnosis not present

## 2014-12-12 DIAGNOSIS — M5441 Lumbago with sciatica, right side: Secondary | ICD-10-CM

## 2014-12-12 NOTE — Therapy (Signed)
[facility] [place], [place], [address] Phone: [phone]   Fax:  [phone]  Physical Therapy Treatment  Patient Details  Name: Marissa Perez MRN: [id number] Date of Birth: 11/29/1945 Referring Provider:  Chipper Herb, MD  Encounter Date: 12/12/2014      PT End of Session - 12/12/14 0909    Visit Number 7   Number of Visits 12   Date for PT Re-Evaluation 01/09/15   PT Start Time 0901   PT Stop Time 0943   PT Time Calculation (min) 42 min   Activity Tolerance Patient tolerated treatment well   Behavior During Therapy [facility] for tasks assessed/performed      Past Medical History  Diagnosis Date  . IBS (irritable bowel syndrome)   . Hyperlipidemia   . Diverticulosis   . Acid reflux   . Hypothyroidism   . History of ETT 7/10    abnormal   . Hypertension   . History of stomach ulcers 2008    positive h. pylori    Past Surgical History  Procedure Laterality Date  . Cesarean section  1972  . Tonsillectomy  1951  . Total abdominal hysterectomy w/ bilateral salpingoophorectomy  1990    Dr. Tamala Julian / fibroids     There were no vitals filed for this visit.  Visit Diagnosis:  DDD (degenerative disc disease), lumbar  Acute back pain with sciatica, right      Subjective Assessment - 12/12/14 0908    Subjective States that she feels okay today. Had a rough day yesterday but rode an aerobic machine for 30 minutes.   Patient Stated Goals Get out of pain.            [facility] PT Assessment - 12/12/14 0001    Assessment   Medical Diagnosis Lumbar DDD                     [facility] Adult PT Treatment/Exercise - 12/12/14 0001    Lumbar Exercises: Aerobic   Stationary Bike NuStep L5 x8 min   Lumbar Exercises: Supine   Ab Set 20 reps   Dead Bug 20 reps   Other Supine Lumbar Exercises Supine marching x20 reps, Bridge with SLR bilaterally x20 reps each   Other Supine Lumbar Exercises Supine ball to lap  2# ball x20 reps, D2 PNF 2# ball bilateral x20 reps   Modalities   Modalities Traction   Traction   Type of Traction Lumbar   Min (lbs) 5   Max (lbs) 75   Hold Time 99   Rest Time 5   Time 15                  PT Short Term Goals - 12/09/14 0901    PT SHORT TERM GOAL #1   Title Ind with initial HEP.   Time 2   Period Weeks   Status Achieved           PT Long Term Goals - 12/09/14 0901    PT LONG TERM GOAL #1   Title Ind with advanced HEP.   Time 4   Period Weeks   Status On-going   PT LONG TERM GOAL #2   Title Eliminate right LE symptoms.   Time 4   Period Weeks   Status Achieved   PT LONG TERM GOAL #3   Title Perform ADL's with pain not > 2/10.   Time 4   Period Weeks   Status  Achieved               Plan - 12/12/14 0929    Clinical Impression Statement Patient tolerated entire treatment well without complaint of pain today. Mechanical traction max weight maintained again at 75 lbs today. Experienced 1/10 pain following treatment. Only remaining goal being advanced HEP which can be provided next week.   Pt will benefit from skilled therapeutic intervention in order to improve on the following deficits Decreased activity tolerance;Pain   Rehab Potential Excellent   PT Frequency 3x / week   PT Duration 4 weeks   PT Treatment/Interventions ADLs/Self Care Home Management;Therapeutic exercise;Ultrasound;Manual techniques;Electrical Stimulation;Traction   PT Next Visit Plan Continue per PT POC. Continue core exercises as symptoms dictate as well as modalites.   Consulted and Agree with Plan of Care Patient        Problem List Patient Active Problem List   Diagnosis Date Noted  . Hypothyroidism 12/11/2012  . Hyperlipidemia 12/11/2012  . Hypertension 12/11/2012  . GERD (gastroesophageal reflux disease) 12/11/2012    Wynelle Fanny, PTA 12/12/2014, 9:48 AM  [facility] [address] [place], [place], [address] Phone: [phone]   Fax:  [phone]

## 2014-12-16 ENCOUNTER — Encounter: Payer: Self-pay | Admitting: Physical Therapy

## 2014-12-16 ENCOUNTER — Ambulatory Visit: Payer: Medicare Other | Admitting: Physical Therapy

## 2014-12-16 DIAGNOSIS — M5441 Lumbago with sciatica, right side: Secondary | ICD-10-CM

## 2014-12-16 DIAGNOSIS — M5136 Other intervertebral disc degeneration, lumbar region: Secondary | ICD-10-CM | POA: Diagnosis not present

## 2014-12-16 NOTE — Therapy (Signed)
[facility] [place], [place], [address] Phone: [phone]   Fax:  [phone]  Physical Therapy Treatment  Patient Details  Name: Marissa Perez MRN: [id number] Date of Birth: 17-Apr-1946 Referring Provider:  Chipper Herb, MD  Encounter Date: 12/16/2014      PT End of Session - 12/16/14 0823    Visit Number 8   Number of Visits 12   Date for PT Re-Evaluation 01/09/15   PT Start Time 0817   PT Stop Time 0905   PT Time Calculation (min) 48 min   Activity Tolerance Patient tolerated treatment well   Behavior During Therapy [facility] for tasks assessed/performed      Past Medical History  Diagnosis Date  . IBS (irritable bowel syndrome)   . Hyperlipidemia   . Diverticulosis   . Acid reflux   . Hypothyroidism   . History of ETT 7/10    abnormal   . Hypertension   . History of stomach ulcers 2008    positive h. pylori    Past Surgical History  Procedure Laterality Date  . Cesarean section  1972  . Tonsillectomy  1951  . Total abdominal hysterectomy w/ bilateral salpingoophorectomy  1990    Dr. Tamala Julian / fibroids     There were no vitals filed for this visit.  Visit Diagnosis:  DDD (degenerative disc disease), lumbar  Acute back pain with sciatica, right      Subjective Assessment - 12/16/14 0822    Subjective States that she has already walked 2 miles this morning so her back is a little sore. Usually better by the end of the day and usually stiff when she rises from prolonged sitting. Sees PA for back next Monday.   Patient Stated Goals Get out of pain.   Currently in Pain? Yes   Pain Score 2    Pain Location Back   Pain Orientation Right;Lower   Pain Descriptors / Indicators Sore   Pain Type Chronic pain   Pain Onset More than a month ago   Pain Frequency Constant            [facility] PT Assessment - 12/16/14 0001    Assessment   Medical Diagnosis Lumbar DDD                      [facility] Adult PT Treatment/Exercise - 12/16/14 0001    Lumbar Exercises: Aerobic   Stationary Bike NuStep L5 x8 min   Lumbar Exercises: Supine   Ab Set Other (comment)  x30 reps   Dead Bug Other (comment)  x30 reps   Other Supine Lumbar Exercises Marching x30 reps, Bridge with bilateral SLR x30 reps each   Other Supine Lumbar Exercises Supine ball to lap 2# ball x30 reps, D2 PNF 2# ball bilateral x30 reps   Modalities   Modalities Traction   Traction   Type of Traction Lumbar   Min (lbs) 5   Max (lbs) 75   Hold Time 99   Rest Time 5   Time 15                PT Education - 12/16/14 0833    Education provided Yes   Education Details HEP: bridge with SLR, dead bug, ball to lap   Person(s) Educated Patient   Methods Explanation;Demonstration;Handout   Comprehension Verbalized understanding;Returned demonstration          PT Short Term Goals - 12/16/14 9798    PT  SHORT TERM GOAL #1   Title Ind with initial HEP.   Time 2   Period Weeks   Status Achieved           PT Long Term Goals - 12/16/14 3818    PT LONG TERM GOAL #1   Title Ind with advanced HEP.   Time 4   Period Weeks   Status On-going   PT LONG TERM GOAL #2   Title Eliminate right LE symptoms.   Time 4   Period Weeks   Status Achieved   PT LONG TERM GOAL #3   Title Perform ADL's with pain not > 2/10.   Time 4   Period Weeks   Status Achieved               Plan - 12/16/14 0849    Clinical Impression Statement Patient continues to tolerated core strengthening well without complaint of increased pain. Mechanical traction max weight maintained again at 75 lbs. Welcomed new HEP for further core strengthening. Experienced 2/10 pain following treatment.   Pt will benefit from skilled therapeutic intervention in order to improve on the following deficits Decreased activity tolerance;Pain   Rehab Potential Excellent   PT Frequency 3x / week   PT Duration 4 weeks   PT Treatment/Interventions  ADLs/Self Care Home Management;Therapeutic exercise;Ultrasound;Manual techniques;Electrical Stimulation;Traction   PT Next Visit Plan Continue per PT POC. Increase traction max weight to 65% of body weight next session.   Consulted and Agree with Plan of Care Patient        Problem List Patient Active Problem List   Diagnosis Date Noted  . Hypothyroidism 12/11/2012  . Hyperlipidemia 12/11/2012  . Hypertension 12/11/2012  . GERD (gastroesophageal reflux disease) 12/11/2012    Wynelle Fanny, PTA 12/16/2014, 9:09 AM  [facility] [address] [place], [place], [address] Phone: [phone]   Fax:  [phone]

## 2014-12-16 NOTE — Patient Instructions (Signed)
Bridging: with Straight Leg Raise   With legs bent, lift buttocks _3___ inches from floor. Then slowly extend right knee, keeping stomach tight. Repeat __10__ times per set. Do __3__ sets per session. Do _2___ sessions per day.  http://orth.exer.us/1104   Copyright  VHI. All rights reserved.

## 2014-12-19 ENCOUNTER — Encounter: Payer: Self-pay | Admitting: *Deleted

## 2014-12-19 ENCOUNTER — Ambulatory Visit: Payer: Medicare Other | Admitting: *Deleted

## 2014-12-19 DIAGNOSIS — M5136 Other intervertebral disc degeneration, lumbar region: Secondary | ICD-10-CM | POA: Diagnosis not present

## 2014-12-19 DIAGNOSIS — M5441 Lumbago with sciatica, right side: Secondary | ICD-10-CM

## 2014-12-19 DIAGNOSIS — M51369 Other intervertebral disc degeneration, lumbar region without mention of lumbar back pain or lower extremity pain: Secondary | ICD-10-CM

## 2014-12-19 NOTE — Therapy (Addendum)
[facility] [place], [place], [address] Phone: [phone]   Fax:  [phone]  Physical Therapy Treatment  Patient Details  Name: Marissa Perez MRN: [id number] Date of Birth: June 04, 1946 Referring Provider:  Chipper Herb, MD  Encounter Date: 12/19/2014    Past Medical History:  Diagnosis Date  . Acid reflux   . Allergy   . Cancer ([place])    SKIN  . Diverticulosis   . History of ETT 7/10   abnormal   . History of stomach ulcers 2008   positive h. pylori  . Hyperlipidemia   . Hypertension   . Hypothyroidism   . IBS (irritable bowel syndrome)     Past Surgical History:  Procedure Laterality Date  . [place]  . MOHS SURGERY    . TONSILLECTOMY  1951  . TOTAL ABDOMINAL HYSTERECTOMY W/ BILATERAL SALPINGOOPHORECTOMY  1990   Dr. Tamala Julian / fibroids     There were no vitals filed for this visit.  Visit Diagnosis:  DDD (degenerative disc disease), lumbar  Acute back pain with sciatica, right                                 PT Short Term Goals - 12/16/14 3832      PT SHORT TERM GOAL #1   Title Ind with initial HEP.   Time 2   Period Weeks   Status Achieved           PT Long Term Goals - 12/16/14 9191      PT LONG TERM GOAL #1   Title Ind with advanced HEP.   Time 4   Period Weeks   Status On-going     PT LONG TERM GOAL #2   Title Eliminate right LE symptoms.   Time 4   Period Weeks   Status Achieved     PT LONG TERM GOAL #3   Title Perform ADL's with pain not > 2/10.   Time 4   Period Weeks   Status Achieved               Problem List Patient Active Problem List   Diagnosis Date Noted  . Hypothyroidism 12/11/2012  . Hyperlipidemia 12/11/2012  . Hypertension 12/11/2012  . GERD (gastroesophageal reflux disease) 12/11/2012    APPLEGATE, [place], PTA 05/31/2016, 6:50 PM  [facility] [address] [place], [place], [address] Phone: [phone]   Fax:  [phone]  PHYSICAL THERAPY DISCHARGE SUMMARY  Visits from Start of Care: 9.  Current functional level related to goals / functional outcomes: Please see above.   Remaining deficits: Goals essentially met.   Education / Equipment: HEP. Plan: Patient agrees to discharge.  Patient goals were met. Patient is being discharged due to meeting the stated rehab goals.  ?????         Mali Applegate MPT

## 2015-01-17 ENCOUNTER — Other Ambulatory Visit: Payer: Self-pay | Admitting: Family Medicine

## 2015-01-27 ENCOUNTER — Other Ambulatory Visit: Payer: Self-pay

## 2015-01-29 ENCOUNTER — Encounter: Payer: Self-pay | Admitting: Family Medicine

## 2015-01-31 ENCOUNTER — Encounter: Payer: Self-pay | Admitting: Family Medicine

## 2015-02-05 ENCOUNTER — Encounter: Payer: Self-pay | Admitting: Family Medicine

## 2015-02-05 ENCOUNTER — Telehealth: Payer: Self-pay | Admitting: Family Medicine

## 2015-02-05 ENCOUNTER — Ambulatory Visit (INDEPENDENT_AMBULATORY_CARE_PROVIDER_SITE_OTHER): Payer: Medicare Other | Admitting: Family Medicine

## 2015-02-05 VITALS — BP 113/69 | HR 71 | Temp 97.2°F | Ht 63.0 in | Wt 142.0 lb

## 2015-02-05 DIAGNOSIS — E039 Hypothyroidism, unspecified: Secondary | ICD-10-CM

## 2015-02-05 DIAGNOSIS — E559 Vitamin D deficiency, unspecified: Secondary | ICD-10-CM

## 2015-02-05 DIAGNOSIS — R635 Abnormal weight gain: Secondary | ICD-10-CM

## 2015-02-05 DIAGNOSIS — K219 Gastro-esophageal reflux disease without esophagitis: Secondary | ICD-10-CM

## 2015-02-05 DIAGNOSIS — E785 Hyperlipidemia, unspecified: Secondary | ICD-10-CM | POA: Diagnosis not present

## 2015-02-05 DIAGNOSIS — I10 Essential (primary) hypertension: Secondary | ICD-10-CM | POA: Diagnosis not present

## 2015-02-05 LAB — POCT CBC
Granulocyte percent: 52 %G (ref 37–80)
HCT, POC: 39.1 % (ref 37.7–47.9)
Hemoglobin: 12.6 g/dL (ref 12.2–16.2)
LYMPH, POC: 1.7 (ref 0.6–3.4)
MCH, POC: 29.6 pg (ref 27–31.2)
MCHC: 32.4 g/dL (ref 31.8–35.4)
MCV: 91.5 fL (ref 80–97)
MPV: 6.5 fL (ref 0–99.8)
PLATELET COUNT, POC: 311 10*3/uL (ref 142–424)
POC GRANULOCYTE: 2.2 (ref 2–6.9)
POC LYMPH %: 39.3 % (ref 10–50)
RBC: 4.27 M/uL (ref 4.04–5.48)
RDW, POC: 13 %
WBC: 4.3 10*3/uL — AB (ref 4.6–10.2)

## 2015-02-05 MED ORDER — EZETIMIBE 10 MG PO TABS: 10.0000 mg | ORAL_TABLET | Freq: Every day | ORAL | Status: DC

## 2015-02-05 NOTE — Progress Notes (Signed)
Subjective:    Patient ID: Marissa Perez, female    DOB: [birth year], 69 y.o.   MRN: [id number]  HPI Pt here for follow up and management of chronic medical problems which includes hypertension and hyperlipidemia. She is taking medications regularly. The patient is concerned about and unexpected weight gain. Her last thyroid profile was in October 2015 and this was normal. She also had a chest x-ray in November of this past year. 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 Her foot is improving. The patient denies chest pain shortness of breath trouble swallowing GI symptoms other than her usual reflux and denies blood in the stool or black tarry bowel movements. He is passing her water without problems. She is up-to-date on all her health maintenance issues including her colonoscopy mammograms and Pap smears.     Patient Active Problem List   Diagnosis Date Noted  . Hypothyroidism 12/11/2012  . Hyperlipidemia 12/11/2012  . Hypertension 12/11/2012  . GERD (gastroesophageal reflux disease) 12/11/2012   Outpatient Encounter Prescriptions as of 02/05/2015  Medication Sig  . aspirin (ASPIRIN LOW DOSE) 81 MG EC tablet Take 81 mg by mouth daily.    [name] Kitchen atorvastatin (LIPITOR) 20 MG tablet Take 1 tablet (20 mg total) by mouth daily.  [name] Kitchen BIOTIN PO Take by mouth.    . Calcium Citrate-Vitamin D (CALCIUM CITRATE + D3 MAXIMUM) 315-250 MG-UNIT TABS Take 1 tablet by mouth 2 (two) times daily.  . Cholecalciferol (VITAMIN D) 2000 UNITS CAPS Take by mouth daily.    . clobetasol (TEMOVATE) 0.05 % external solution   . conjugated estrogens (PREMARIN) vaginal cream USE 1 GM VAGINALLY 2 TO 3 TIMES A WEEK  . esomeprazole (NEXIUM) 20 MG capsule Take 20 mg by mouth daily at 12 noon.  [name] Kitchen levothyroxine (SYNTHROID, LEVOTHROID) 50 MCG tablet TAKE 1/2 TAB ON MON, WED, FRI & 1 TAB ON  TUES AND THURS  . Magnesium 100 MG CAPS Take 1 capsule by mouth daily.  . meloxicam (MOBIC) 15 MG tablet TAKE 1 TABLET ONCE A DAY (Patient taking differently: TAKE 1 TABLET ONCE A DAY as needed)  . Multiple Vitamin (MULTI-VITAMIN PO) Take by mouth daily.    [name] Kitchen OVER THE COUNTER MEDICATION Take 4 capsules by mouth daily. JR Carlson's Elite Omega Fish oil  . valsartan (DIOVAN) 320 MG tablet TAKE (1) TABLET DAILY AS DIRECTED.  [name] Kitchen ZETIA 10 MG tablet TAKE 1 TABLET DAILY  . [DISCONTINUED] meloxicam (MOBIC) 15 MG tablet Take 1 tablet by mouth daily.   No facility-administered encounter medications on file as of 02/05/2015.      Review of Systems  Constitutional: Positive for unexpected weight change (weight increase).  HENT: Negative.   Eyes: Negative.   Respiratory: Negative.   Cardiovascular: Negative.   Gastrointestinal: Negative.   Endocrine: Negative.   Genitourinary: Negative.   Musculoskeletal: Negative.   Skin: Negative.   Allergic/Immunologic: Negative.   Neurological: Negative.   Hematological: Negative.   Psychiatric/Behavioral: Negative.        Objective:   Physical Exam  Constitutional: She is oriented to person, place, and time. She appears well-developed and well-nourished.  HENT:  Head: Normocephalic and atraumatic.  Right Ear: External ear normal.  Left Ear: External ear normal.  Nose: Nose normal.  Mouth/Throat: Oropharynx is clear and moist.  Eyes: Conjunctivae and EOM are normal. Pupils are  equal, round, and reactive to light. Right eye exhibits no discharge. Left eye exhibits no discharge. No scleral icterus.  Neck: Normal range of motion. Neck supple. No thyromegaly present.  Neck without bruits or thyromegaly  Cardiovascular: Normal rate, regular rhythm, normal heart sounds and intact distal pulses.   No murmur heard. Pulmonary/Chest: Effort normal and breath sounds normal. No respiratory distress. She has no wheezes. She has no rales. She exhibits no  tenderness.  Clear anteriorly and posteriorly and no axillary adenopathy  Abdominal: Soft. Bowel sounds are normal. She exhibits no mass. There is no tenderness. There is no rebound and no guarding.  Nontender without masses or organ enlargement or bruits  Musculoskeletal: Normal range of motion. She exhibits no edema or tenderness.  Lymphadenopathy:    She has no cervical adenopathy.  Neurological: She is alert and oriented to person, place, and time. No cranial nerve deficit.  The right lower extremity reflex is diminished compared to the left and the orthopedic surgeon is aware of this.  Skin: Skin is warm and dry. No rash noted.  Psychiatric: She has a normal mood and affect. Her behavior is normal. Judgment and thought content normal.  Nursing note and vitals reviewed.  BP 113/69 mmHg  Pulse 71  Temp(Src) 97.2 F (36.2 C) (Oral)  Ht _0  (1.6 m)  Wt 142 lb (64.411 kg)  BMI 25.16 kg/m2        Assessment & Plan:  1. Gastroesophageal reflux disease, esophagitis presence not specified -She continues to have some problems with this but it is asked to better than usual. - POCT CBC  2. Hyperlipidemia -She should continue current treatment as well as aggressive therapeutic lifestyle changes - POCT CBC - NMR, lipoprofile  3. Essential hypertension -Blood pressure is good today and she should continue with current treatment - POCT CBC - BMP8+EGFR - Hepatic function panel  4. Hypothyroidism, unspecified hypothyroidism type -Her last thyroid profile was normal however she has had the weight gain and we will recheck this at this time. She has no symptoms of hypothyroidism. - POCT CBC - Thyroid Panel With TSH  5. Vitamin D deficiency -Isac Caddy current treatment pending results of lab work - POCT CBC - Vit D  25 hydroxy (rtn osteoporosis monitoring)  6. Weight gain -This is been of out 4-5 pounds and the patient has been walking more in the past month. She will continue her  exercise regimen and will try to drink more water.  Meds ordered this encounter  Medications  . ezetimibe (ZETIA) 10 MG tablet    Sig: Take 1 tablet (10 mg total) by mouth daily.    Dispense:  30 tablet    Refill:  6   Patient Instructions                       Medicare Annual Wellness Visit  [facility] and the medical providers at [facility] strive to bring you the best medical care.  In doing so we not only want to address your current medical conditions and concerns but also to detect new conditions early and prevent illness, disease and health-related problems.    Medicare offers a yearly Wellness Visit which allows our clinical staff to assess your need for preventative services including immunizations, lifestyle education, counseling to decrease risk of preventable diseases and screening for fall risk and other medical concerns.    This visit is provided free of charge (no copay) for all  Medicare recipients. The clinical pharmacists at [facility] have begun to conduct these Wellness Visits which will also include a thorough review of all your medications.    As you primary medical provider recommend that you make an appointment for your Annual Wellness Visit if you have not done so already this year.  You may set up this appointment before you leave today or you may call back ([phone]) and schedule an appointment.  Please make sure when you call that you mention that you are scheduling your Annual Wellness Visit with the clinical pharmacist so that the appointment may be made for the proper length of time.     Continue current medications. Continue good therapeutic lifestyle changes which include good diet and exercise. Fall precautions discussed with patient. If an FOBT was given today- please return it to our front desk. If you are over 67 years old - you may need Prevnar 26 or the adult Pneumonia vaccine.  Flu Shots are still  available at our office. If you still haven't had one please call to set up a nurse visit to get one.   After your visit with [place] today you will receive a survey in the mail or online from [employer] regarding your care with [place]. Please take a moment to fill this out. Your feedback is very important to [place] as you can help [place] better understand your patient needs as well as improve your experience and satisfaction. WE CARE ABOUT YOU!!!   Continue to exercise regularly with walking on level ground. Always being careful not to put yourself at risk for falling Follow-up with orthopedist as planned This summer drink plenty of fluids and watch carbohydrate intake   Arrie Senate MD

## 2015-02-05 NOTE — Patient Instructions (Addendum)
Medicare Annual Wellness Visit  [facility] and the medical providers at [facility] strive to bring you the best medical care.  In doing so we not only want to address your current medical conditions and concerns but also to detect new conditions early and prevent illness, disease and health-related problems.    Medicare offers a yearly Wellness Visit which allows our clinical staff to assess your need for preventative services including immunizations, lifestyle education, counseling to decrease risk of preventable diseases and screening for fall risk and other medical concerns.    This visit is provided free of charge (no copay) for all Medicare recipients. The clinical pharmacists at [facility] have begun to conduct these Wellness Visits which will also include a thorough review of all your medications.    As you primary medical provider recommend that you make an appointment for your Annual Wellness Visit if you have not done so already this year.  You may set up this appointment before you leave today or you may call back ([phone]) and schedule an appointment.  Please make sure when you call that you mention that you are scheduling your Annual Wellness Visit with the clinical pharmacist so that the appointment may be made for the proper length of time.     Continue current medications. Continue good therapeutic lifestyle changes which include good diet and exercise. Fall precautions discussed with patient. If an FOBT was given today- please return it to our front desk. If you are over 28 years old - you may need Prevnar 73 or the adult Pneumonia vaccine.  Flu Shots are still available at our office. If you still haven't had one please call to set up a nurse visit to get one.   After your visit with [place] today you will receive a survey in the mail or online from [employer] regarding your care with [place]. Please take a moment to  fill this out. Your feedback is very important to [place] as you can help [place] better understand your patient needs as well as improve your experience and satisfaction. WE CARE ABOUT YOU!!!   Continue to exercise regularly with walking on level ground. Always being careful not to put yourself at risk for falling Follow-up with orthopedist as planned This summer drink plenty of fluids and watch carbohydrate intake

## 2015-02-05 NOTE — Telephone Encounter (Signed)
Appt given to patient and her husband per patients request

## 2015-02-06 LAB — NMR, LIPOPROFILE
Cholesterol: 168 mg/dL (ref 100–199)
HDL Cholesterol by NMR: 60 mg/dL (ref >39)
HDL Particle Number: 48.6 umol/L (ref >=30.5)
LDL PARTICLE NUMBER: 1081 nmol/L — AB (ref <1000)
LDL SIZE: 20.6 nm (ref >20.5)
LDL-C: 77 mg/dL (ref 0–99)
LP-IR SCORE: 66 — AB (ref <=45)
SMALL LDL PARTICLE NUMBER: 457 nmol/L (ref <=527)
Triglycerides by NMR: 157 mg/dL — ABNORMAL HIGH (ref 0–149)

## 2015-02-06 LAB — HEPATIC FUNCTION PANEL
ALBUMIN: 4.9 g/dL — AB (ref 3.6–4.8)
ALT: 32 IU/L (ref 0–32)
AST: 29 IU/L (ref 0–40)
Alkaline Phosphatase: 62 IU/L (ref 39–117)
BILIRUBIN, DIRECT: 0.16 mg/dL (ref 0.00–0.40)
Bilirubin Total: 0.6 mg/dL (ref 0.0–1.2)
Total Protein: 7.3 g/dL (ref 6.0–8.5)

## 2015-02-06 LAB — BMP8+EGFR
BUN / CREAT RATIO: 21 (ref 11–26)
BUN: 12 mg/dL (ref 8–27)
CALCIUM: 9.8 mg/dL (ref 8.7–10.3)
CHLORIDE: 98 mmol/L (ref 97–108)
CO2: 25 mmol/L (ref 18–29)
Creatinine, Ser: 0.58 mg/dL (ref 0.57–1.00)
GFR calc Af Amer: 109 mL/min/{1.73_m2} (ref >59)
GFR calc non Af Amer: 94 mL/min/{1.73_m2} (ref >59)
Glucose: 96 mg/dL (ref 65–99)
Potassium: 4.7 mmol/L (ref 3.5–5.2)
Sodium: 139 mmol/L (ref 134–144)

## 2015-02-06 LAB — THYROID PANEL WITH TSH
Free Thyroxine Index: 1.8 (ref 1.2–4.9)
T3 Uptake Ratio: 29 % (ref 24–39)
T4, Total: 6.2 ug/dL (ref 4.5–12.0)
TSH: 2.71 u[IU]/mL (ref 0.450–4.500)

## 2015-02-06 LAB — VITAMIN D 25 HYDROXY (VIT D DEFICIENCY, FRACTURES): Vit D, 25-Hydroxy: 40.5 ng/mL (ref 30.0–100.0)

## 2015-02-11 ENCOUNTER — Other Ambulatory Visit: Payer: Self-pay | Admitting: Family Medicine

## 2015-04-19 ENCOUNTER — Ambulatory Visit (INDEPENDENT_AMBULATORY_CARE_PROVIDER_SITE_OTHER): Payer: Medicare Other | Admitting: Family

## 2015-04-19 ENCOUNTER — Encounter: Payer: Self-pay | Admitting: Family

## 2015-04-19 VITALS — BP 141/73 | HR 77 | Temp 96.9°F | Ht 63.0 in | Wt 141.0 lb

## 2015-04-19 DIAGNOSIS — H109 Unspecified conjunctivitis: Secondary | ICD-10-CM | POA: Diagnosis not present

## 2015-04-19 MED ORDER — BACITRACIN-POLYMYXIN B 500-10000 UNIT/GM OP OINT: 1.0000 "application " | TOPICAL_OINTMENT | Freq: Two times a day (BID) | OPHTHALMIC | Status: DC

## 2015-04-19 NOTE — Progress Notes (Signed)
   Subjective:    Patient ID: Marissa Perez, female    DOB: 1946/05/11, 69 y.o.   MRN: [id number]  Eye Problem  The right eye is affected. This is a new problem. The current episode started in the past 7 days. The problem occurs constantly. The problem has been gradually worsening. There was no injury mechanism. The pain is at a severity of 0/10. The patient is experiencing no pain. There is no known exposure to pink eye. She does not wear contacts. Associated symptoms include an eye discharge, eye redness and itching. Pertinent negatives include no blurred vision, double vision, fever, nausea, photophobia or vomiting. She has tried eye drops (claritin ) for the symptoms. The treatment provided mild relief.      Review of Systems  Constitutional: Negative.  Negative for fever.  HENT: Negative.   Eyes: Positive for discharge and redness. Negative for blurred vision, double vision and photophobia.  Respiratory: Negative.  Negative for shortness of breath.   Cardiovascular: Negative.  Negative for palpitations.  Gastrointestinal: Negative.  Negative for nausea and vomiting.  Endocrine: Negative.   Genitourinary: Negative.   Musculoskeletal: Negative.   Skin: Positive for itching.  Neurological: Negative.  Negative for headaches.  Hematological: Negative.   Psychiatric/Behavioral: Negative.   All other systems reviewed and are negative.      Objective:   Physical Exam  Constitutional: She is oriented to person, place, and time. She appears well-developed and well-nourished. No distress.  HENT:  Head: Normocephalic and atraumatic.  Right Ear: External ear normal.  Mouth/Throat: Oropharynx is clear and moist.  Eyes: Pupils are equal, round, and reactive to light. Right eye exhibits discharge (Conjunctivitis).  Neck: Normal range of motion. Neck supple. No thyromegaly present.  Cardiovascular: Normal rate, regular rhythm, normal heart sounds and intact distal pulses.   No murmur  heard. Pulmonary/Chest: Effort normal and breath sounds normal. No respiratory distress. She has no wheezes.  Abdominal: Soft. Bowel sounds are normal. She exhibits no distension. There is no tenderness.  Musculoskeletal: Normal range of motion. She exhibits no edema or tenderness.  Neurological: She is alert and oriented to person, place, and time. She has normal reflexes. No cranial nerve deficit.  Skin: Skin is warm and dry.  Psychiatric: She has a normal mood and affect. Her behavior is normal. Judgment and thought content normal.  Vitals reviewed.   BP 141/73 mmHg  Pulse 77  Temp(Src) 96.9 F (36.1 C) (Oral)  Ht 5\' 3"  (1.6 m)  Wt 141 lb (63.957 kg)  BMI 24.98 kg/m2       Assessment & Plan:  1. Conjunctivitis of right eye -Do not rub eyes -Good hand hygiene discussed RTO prn - bacitracin-polymyxin b (POLYSPORIN) ophthalmic ointment; Place 1 application into the right eye 2 (two) times daily. apply to eye every 12 hours while awake  Dispense: 3.5 g; Refill: 0  Evelina Dun, FNP

## 2015-04-19 NOTE — Patient Instructions (Signed)

## 2015-04-30 ENCOUNTER — Encounter: Payer: Self-pay | Admitting: Family Medicine

## 2015-05-02 ENCOUNTER — Ambulatory Visit (INDEPENDENT_AMBULATORY_CARE_PROVIDER_SITE_OTHER): Payer: Medicare Other

## 2015-05-02 DIAGNOSIS — Z23 Encounter for immunization: Secondary | ICD-10-CM

## 2015-05-26 ENCOUNTER — Other Ambulatory Visit: Payer: Medicare Other

## 2015-05-30 ENCOUNTER — Other Ambulatory Visit (INDEPENDENT_AMBULATORY_CARE_PROVIDER_SITE_OTHER): Payer: Medicare Other

## 2015-05-30 DIAGNOSIS — I10 Essential (primary) hypertension: Secondary | ICD-10-CM

## 2015-05-30 DIAGNOSIS — E559 Vitamin D deficiency, unspecified: Secondary | ICD-10-CM

## 2015-05-30 DIAGNOSIS — E039 Hypothyroidism, unspecified: Secondary | ICD-10-CM

## 2015-05-30 DIAGNOSIS — E785 Hyperlipidemia, unspecified: Secondary | ICD-10-CM | POA: Diagnosis not present

## 2015-05-30 NOTE — Progress Notes (Signed)
Lab only 

## 2015-06-04 NOTE — Addendum Note (Signed)
Addended by: Pollyann Kennedy F on: 06/04/2015 09:30 AM   Modules accepted: Orders

## 2015-06-09 ENCOUNTER — Ambulatory Visit (INDEPENDENT_AMBULATORY_CARE_PROVIDER_SITE_OTHER): Payer: Medicare Other | Admitting: Family Medicine

## 2015-06-09 ENCOUNTER — Encounter: Payer: Self-pay | Admitting: Family Medicine

## 2015-06-09 ENCOUNTER — Ambulatory Visit (INDEPENDENT_AMBULATORY_CARE_PROVIDER_SITE_OTHER): Payer: Medicare Other

## 2015-06-09 VITALS — BP 142/70 | HR 71 | Temp 97.7°F | Ht 63.0 in | Wt 142.0 lb

## 2015-06-09 DIAGNOSIS — E039 Hypothyroidism, unspecified: Secondary | ICD-10-CM

## 2015-06-09 DIAGNOSIS — E785 Hyperlipidemia, unspecified: Secondary | ICD-10-CM

## 2015-06-09 DIAGNOSIS — K219 Gastro-esophageal reflux disease without esophagitis: Secondary | ICD-10-CM

## 2015-06-09 DIAGNOSIS — Z13820 Encounter for screening for osteoporosis: Secondary | ICD-10-CM | POA: Diagnosis not present

## 2015-06-09 DIAGNOSIS — Z780 Asymptomatic menopausal state: Secondary | ICD-10-CM

## 2015-06-09 DIAGNOSIS — I10 Essential (primary) hypertension: Secondary | ICD-10-CM

## 2015-06-09 DIAGNOSIS — E559 Vitamin D deficiency, unspecified: Secondary | ICD-10-CM | POA: Diagnosis not present

## 2015-06-09 MED ORDER — EZETIMIBE 10 MG PO TABS: 10.0000 mg | ORAL_TABLET | Freq: Every day | ORAL | Status: DC

## 2015-06-09 MED ORDER — LEVOTHYROXINE SODIUM 50 MCG PO TABS: ORAL_TABLET | ORAL | Status: DC

## 2015-06-09 MED ORDER — VALSARTAN 320 MG PO TABS: ORAL_TABLET | ORAL | Status: DC

## 2015-06-09 MED ORDER — ATORVASTATIN CALCIUM 20 MG PO TABS: 20.0000 mg | ORAL_TABLET | Freq: Every day | ORAL | Status: DC

## 2015-06-09 NOTE — Progress Notes (Signed)
Subjective:    Patient ID: Marissa Perez, female    DOB: 24-Feb-1946, 69 y.o.   MRN: [id number]  HPI Pt here for follow up and management of chronic medical problems which includes hyperlipidemia, hypothyroid, and hypertension. She is taking medications regularly. The patient continues to have some arthralgias and pain in her right leg. She has seen the orthopedic surgeon in the past about foot and leg issues. She is requesting refills on all her medicines. Her lab work had to be redrawn because of the name confusion with her husband on previous lab work. She is due to get her DEXA scan today. The patient continues to have problems with her back and has had an injection in the back about 4 months ago which gave her some relief as far as her right leg is concerned. The orthopedist told the patient to call back again if the problem returned and he would do another injection. She denies chest pain shortness of breath trouble swallowing and heartburn indigestion and nausea vomiting diarrhea or blood in the stool. She is passing her water without problems. 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      Patient Active Problem List   Diagnosis Date Noted  . Hypothyroidism 12/11/2012  . Hyperlipidemia 12/11/2012  . Hypertension 12/11/2012  . GERD (gastroesophageal reflux disease) 12/11/2012   Outpatient Encounter Prescriptions as of 06/09/2015  Medication Sig  . aspirin (ASPIRIN LOW DOSE) 81 MG EC tablet Take 81 mg by mouth daily.    [name] Kitchen atorvastatin (LIPITOR) 20 MG tablet Take 1 tablet (20 mg total) by mouth daily.  . bacitracin-polymyxin b (POLYSPORIN) ophthalmic ointment Place 1 application into the right eye 2 (two) times daily. apply to eye every 12 hours while awake  . BIOTIN PO Take by mouth.    . Calcium Citrate-Vitamin D (CALCIUM CITRATE + D3 MAXIMUM) 315-250 MG-UNIT TABS Take 1 tablet by mouth 2 (two) times daily.  . Cholecalciferol (VITAMIN D PO) Take 5,000 Units by mouth daily.  . clobetasol  (TEMOVATE) 0.05 % external solution   . esomeprazole (NEXIUM) 20 MG capsule Take 20 mg by mouth daily at 12 noon.  . ezetimibe (ZETIA) 10 MG tablet Take 1 tablet (10 mg total) by mouth daily.  [name] Kitchen levothyroxine (SYNTHROID, LEVOTHROID) 50 MCG tablet TAKE 1/2 TAB ON MON, WED, FRI & 1 TAB ON TUES AND THURS  . Magnesium 100 MG CAPS Take 1 capsule by mouth daily.  . meloxicam (MOBIC) 15 MG tablet TAKE 1 TABLET ONCE A DAY (Patient taking differently: TAKE 1 TABLET ONCE A DAY as needed)  . Multiple Vitamin (MULTI-VITAMIN PO) Take by mouth daily.    [name] Kitchen OVER THE COUNTER MEDICATION Take 4 capsules by mouth daily. JR Carlson's Elite Omega Fish oil  . PREMARIN vaginal cream USE 1 GM VAGINALLY 2 TO 3 TIMES A WEEK  . valsartan (DIOVAN) 320 MG tablet TAKE (1) TABLET DAILY AS DIRECTED.   No facility-administered encounter medications on file as of 06/09/2015.      Review of Systems  Constitutional: Negative.   HENT: Negative.   Eyes: Negative.   Respiratory: Negative.   Cardiovascular: Negative.   Gastrointestinal: Negative.   Endocrine: Negative.   Genitourinary: Negative.   Musculoskeletal: Positive for arthralgias (right leg pains).  Skin: Negative.   Allergic/Immunologic: Negative.   Neurological: Negative.   Hematological: Negative.   Psychiatric/Behavioral: Negative.        Objective:   Physical Exam  Constitutional: She is oriented to person, place, and  time. She appears well-developed and well-nourished. No distress.  HENT:  Head: Normocephalic and atraumatic.  Right Ear: External ear normal.  Left Ear: External ear normal.  Nose: Nose normal.  Mouth/Throat: Oropharynx is clear and moist. No oropharyngeal exudate.  Eyes: Conjunctivae and EOM are normal. Pupils are equal, round, and reactive to light. Right eye exhibits no discharge. Left eye exhibits no discharge. No scleral icterus.  Neck: Normal range of motion. Neck supple. No thyromegaly present.  Neck without thyromegaly or  bruits or adenopathy  Cardiovascular: Normal rate, regular rhythm, normal heart sounds and intact distal pulses.   No murmur heard. Regular rate and rhythm at 72/m  Pulmonary/Chest: Effort normal and breath sounds normal. No respiratory distress. She has no wheezes. She has no rales. She exhibits no tenderness.  Clear anteriorly and posteriorly  Abdominal: Soft. Bowel sounds are normal. She exhibits no mass. There is no tenderness. There is no rebound and no guarding.  No abdominal tenderness liver or spleen enlargement.  Musculoskeletal: Normal range of motion. She exhibits no edema.  Leg raising and hip abduction were good bilaterally without reproducing any pain.  Lymphadenopathy:    She has no cervical adenopathy.  Neurological: She is alert and oriented to person, place, and time. She has normal reflexes. No cranial nerve deficit.  The lower extremity knee jerk was slightly decreased on the right compared to the left  Skin: Skin is warm and dry. No rash noted.  Psychiatric: She has a normal mood and affect. Her behavior is normal. Judgment and thought content normal.  Nursing note and vitals reviewed.  BP 142/70 mmHg  Pulse 71  Temp(Src) 97.7 F (36.5 C) (Oral)  Ht 5' 3"  (1.6 m)  Wt 142 lb (64.411 kg)  BMI 25.16 kg/m2  Repeat blood pressure in the right arm sitting was 138/86      Assessment & Plan:  1. Hyperlipidemia -Continue  current treatment pending results of lab work - CBC with Differential/Platelet - BMP8+EGFR - NMR, lipoprofile - Vit D  25 hydroxy (rtn osteoporosis monitoring) - Thyroid Panel With TSH  2. Essential hypertension -The blood pressure was slightly elevated upon arrival today but the patient says her numbers at home are always good in the 120s over the 70s. - CBC with Differential/Platelet - BMP8+EGFR - NMR, lipoprofile - Vit D  25 hydroxy (rtn osteoporosis monitoring) - Thyroid Panel With TSH  3. Hypothyroidism, unspecified hypothyroidism  type -Continue current treatment pending results of lab work. - CBC with Differential/Platelet - BMP8+EGFR - NMR, lipoprofile - Vit D  25 hydroxy (rtn osteoporosis monitoring) - Thyroid Panel With TSH  4. Vitamin D deficiency -Continue vitamin D replacement pending results of lab work - CBC with Differential/Platelet - BMP8+EGFR - NMR, lipoprofile - Vit D  25 hydroxy (rtn osteoporosis monitoring) - Thyroid Panel With TSH  5. Gastroesophageal reflux disease, esophagitis presence not specified -The patient has no problems with reflux and no complaints and no blood in the stool which is noticed.  6. Screening for osteoporosis -She should continue to take her vitamin D and calcium replacement. - DG Bone Density; Future  7. Postmenopausal -Continue vitamin D and calcium replacement. - DG Bone Density; Future  Meds ordered this encounter  Medications  . atorvastatin (LIPITOR) 20 MG tablet    Sig: Take 1 tablet (20 mg total) by mouth daily.    Dispense:  90 tablet    Refill:  3  . ezetimibe (ZETIA) 10 MG tablet    Sig:  Take 1 tablet (10 mg total) by mouth daily.    Dispense:  90 tablet    Refill:  3  . levothyroxine (SYNTHROID, LEVOTHROID) 50 MCG tablet    Sig: TAKE 1/2 TAB ON MON, WED, FRI & 1 TAB ON TUES AND THURS    Dispense:  90 tablet    Refill:  3  . valsartan (DIOVAN) 320 MG tablet    Sig: TAKE (1) TABLET DAILY AS DIRECTED.    Dispense:  90 tablet    Refill:  3   Patient Instructions                       Medicare Annual Wellness Visit  [facility] and the medical providers at [facility] strive to bring you the best medical care.  In doing so we not only want to address your current medical conditions and concerns but also to detect new conditions early and prevent illness, disease and health-related problems.    Medicare offers a yearly Wellness Visit which allows our clinical staff to assess your need for preventative services including  immunizations, lifestyle education, counseling to decrease risk of preventable diseases and screening for fall risk and other medical concerns.    This visit is provided free of charge (no copay) for all Medicare recipients. The clinical pharmacists at [facility] have begun to conduct these Wellness Visits which will also include a thorough review of all your medications.    As you primary medical provider recommend that you make an appointment for your Annual Wellness Visit if you have not done so already this year.  You may set up this appointment before you leave today or you may call back ([phone]) and schedule an appointment.  Please make sure when you call that you mention that you are scheduling your Annual Wellness Visit with the clinical pharmacist so that the appointment may be made for the proper length of time.     Continue current medications. Continue good therapeutic lifestyle changes which include good diet and exercise. Fall precautions discussed with patient. If an FOBT was given today- please return it to our front desk. If you are over 49 years old - you may need Prevnar 73 or the adult Pneumonia vaccine.  **Flu shots are available--- please call and schedule a FLU-CLINIC appointment**  After your visit with [place] today you will receive a survey in the mail or online from [employer] regarding your care with [place]. Please take a moment to fill this out. Your feedback is very important to [place] as you can help [place] better understand your patient needs as well as improve your experience and satisfaction. WE CARE ABOUT YOU!!!   We will call with the lab work results as soon as it becomes available Please monitor blood pressures at home and bring readings to your next visit We will schedule you for a wellness visit with the clinical pharmacist Continue to be careful and walk slowly so as not to fall and reinjury your back or feet. Follow-up with orthopedist as  planned   Arrie Senate MD

## 2015-06-09 NOTE — Patient Instructions (Addendum)
Medicare Annual Wellness Visit  [place] and the medical providers at [facility] strive to bring you the best medical care.  In doing so we not only want to address your current medical conditions and concerns but also to detect new conditions early and prevent illness, disease and health-related problems.    Medicare offers a yearly Wellness Visit which allows our clinical staff to assess your need for preventative services including immunizations, lifestyle education, counseling to decrease risk of preventable diseases and screening for fall risk and other medical concerns.    This visit is provided free of charge (no copay) for all Medicare recipients. The clinical pharmacists at [facility] have begun to conduct these Wellness Visits which will also include a thorough review of all your medications.    As you primary medical provider recommend that you make an appointment for your Annual Wellness Visit if you have not done so already this year.  You may set up this appointment before you leave today or you may call back ([phone]) and schedule an appointment.  Please make sure when you call that you mention that you are scheduling your Annual Wellness Visit with the clinical pharmacist so that the appointment may be made for the proper length of time.     Continue current medications. Continue good therapeutic lifestyle changes which include good diet and exercise. Fall precautions discussed with patient. If an FOBT was given today- please return it to our front desk. If you are over 27 years old - you may need Prevnar 84 or the adult Pneumonia vaccine.  **Flu shots are available--- please call and schedule a FLU-CLINIC appointment**  After your visit with [place] today you will receive a survey in the mail or online from [employer] regarding your care with [place]. Please take a moment to fill this out. Your feedback is very  important to [place] as you can help [place] better understand your patient needs as well as improve your experience and satisfaction. WE CARE ABOUT YOU!!!   We will call with the lab work results as soon as it becomes available Please monitor blood pressures at home and bring readings to your next visit We will schedule you for a wellness visit with the clinical pharmacist Continue to be careful and walk slowly so as not to fall and reinjury your back or feet. Follow-up with orthopedist as planned

## 2015-06-09 NOTE — Progress Notes (Signed)
Patient informed and voices understanding 

## 2015-06-10 LAB — BMP8+EGFR
BUN / CREAT RATIO: 17 (ref 11–26)
BUN: 11 mg/dL (ref 8–27)
CHLORIDE: 101 mmol/L (ref 97–106)
CO2: 24 mmol/L (ref 18–29)
CREATININE: 0.64 mg/dL (ref 0.57–1.00)
Calcium: 9.4 mg/dL (ref 8.7–10.3)
GFR calc Af Amer: 105 mL/min/{1.73_m2} (ref >59)
GFR calc non Af Amer: 91 mL/min/{1.73_m2} (ref >59)
GLUCOSE: 92 mg/dL (ref 65–99)
Potassium: 4.3 mmol/L (ref 3.5–5.2)
Sodium: 143 mmol/L (ref 136–144)

## 2015-06-10 LAB — CBC WITH DIFFERENTIAL/PLATELET
BASOS ABS: 0.1 10*3/uL (ref 0.0–0.2)
Basos: 1 %
EOS (ABSOLUTE): 0.1 10*3/uL (ref 0.0–0.4)
Eos: 2 %
HEMOGLOBIN: 12.2 g/dL (ref 11.1–15.9)
Hematocrit: 36.2 % (ref 34.0–46.6)
IMMATURE GRANS (ABS): 0 10*3/uL (ref 0.0–0.1)
IMMATURE GRANULOCYTES: 0 %
LYMPHS: 39 %
Lymphocytes Absolute: 1.8 10*3/uL (ref 0.7–3.1)
MCH: 31 pg (ref 26.6–33.0)
MCHC: 33.7 g/dL (ref 31.5–35.7)
MCV: 92 fL (ref 79–97)
MONOCYTES: 5 %
Monocytes Absolute: 0.2 10*3/uL (ref 0.1–0.9)
NEUTROS PCT: 53 %
Neutrophils Absolute: 2.4 10*3/uL (ref 1.4–7.0)
PLATELETS: 312 10*3/uL (ref 150–379)
RBC: 3.93 x10E6/uL (ref 3.77–5.28)
RDW: 13.9 % (ref 12.3–15.4)
WBC: 4.6 10*3/uL (ref 3.4–10.8)

## 2015-06-10 LAB — THYROID PANEL WITH TSH
Free Thyroxine Index: 2.1 (ref 1.2–4.9)
T3 UPTAKE RATIO: 33 % (ref 24–39)
T4 TOTAL: 6.5 ug/dL (ref 4.5–12.0)
TSH: 3.37 u[IU]/mL (ref 0.450–4.500)

## 2015-06-10 LAB — NMR, LIPOPROFILE
CHOLESTEROL: 148 mg/dL (ref 100–199)
HDL Cholesterol by NMR: 63 mg/dL (ref >39)
HDL Particle Number: 44.6 umol/L (ref >=30.5)
LDL PARTICLE NUMBER: 821 nmol/L (ref <1000)
LDL SIZE: 20.5 nm (ref >20.5)
LDL-C: 64 mg/dL (ref 0–99)
LP-IR SCORE: 59 — AB (ref <=45)
Small LDL Particle Number: 516 nmol/L (ref <=527)
TRIGLYCERIDES BY NMR: 104 mg/dL (ref 0–149)

## 2015-06-10 LAB — VITAMIN D 25 HYDROXY (VIT D DEFICIENCY, FRACTURES): Vit D, 25-Hydroxy: 45.5 ng/mL (ref 30.0–100.0)

## 2015-07-23 ENCOUNTER — Telehealth: Payer: Self-pay | Admitting: Family Medicine

## 2015-07-24 MED ORDER — HYDROCHLOROTHIAZIDE 12.5 MG PO CAPS: 12.5000 mg | ORAL_CAPSULE | Freq: Every day | ORAL | Status: DC

## 2015-07-24 NOTE — Telephone Encounter (Signed)
Pt aware and hctz to be called in to [facility] rx

## 2015-07-24 NOTE — Telephone Encounter (Signed)
Pt called - readings are running: 134/69-AM 162/78 - PM 131/70 AM 147/75 PM 145/75 PM 132/81 AM 150/72 PM 128/67 AM 148/82 PM 146/74 PM 148/80 PM 166/81 PM Better in the morning and worse at night Pt med list was reviewed and is correct Note -( the diovan is 160 mg daily in the am)

## 2015-07-24 NOTE — Telephone Encounter (Signed)
Review blood pressures in 2 weeks and get BMP after adding HCTZ. Please confirm that the patient is taking a whole Diovan

## 2015-07-24 NOTE — Telephone Encounter (Signed)
Add HCTZ 12.5 and continue Diovan 320------ try to take as little meloxicam as possible because NSAIDs can run up the blood pressure and this will also include ibuprofen and Aleve

## 2015-08-11 ENCOUNTER — Other Ambulatory Visit (INDEPENDENT_AMBULATORY_CARE_PROVIDER_SITE_OTHER): Payer: Medicare Other

## 2015-08-11 DIAGNOSIS — R799 Abnormal finding of blood chemistry, unspecified: Secondary | ICD-10-CM

## 2015-08-12 LAB — BMP8+EGFR
BUN/Creatinine Ratio: 16 (ref 11–26)
BUN: 11 mg/dL (ref 8–27)
CHLORIDE: 91 mmol/L — AB (ref 96–106)
CO2: 24 mmol/L (ref 18–29)
CREATININE: 0.69 mg/dL (ref 0.57–1.00)
Calcium: 9.5 mg/dL (ref 8.7–10.3)
GFR calc Af Amer: 103 mL/min/{1.73_m2} (ref >59)
GFR calc non Af Amer: 89 mL/min/{1.73_m2} (ref >59)
GLUCOSE: 79 mg/dL (ref 65–99)
POTASSIUM: 4.3 mmol/L (ref 3.5–5.2)
SODIUM: 133 mmol/L — AB (ref 134–144)

## 2015-09-02 ENCOUNTER — Ambulatory Visit (INDEPENDENT_AMBULATORY_CARE_PROVIDER_SITE_OTHER): Payer: Medicare Other | Admitting: *Deleted

## 2015-09-02 ENCOUNTER — Other Ambulatory Visit: Payer: Medicare Other

## 2015-09-02 VITALS — BP 124/78 | HR 74

## 2015-09-02 DIAGNOSIS — R799 Abnormal finding of blood chemistry, unspecified: Secondary | ICD-10-CM

## 2015-09-02 DIAGNOSIS — I10 Essential (primary) hypertension: Secondary | ICD-10-CM

## 2015-09-02 NOTE — Patient Instructions (Signed)
Hypertension Hypertension, commonly called high blood pressure, is when the force of blood pumping through your arteries is too strong. Your arteries are the blood vessels that carry blood from your heart throughout your body. A blood pressure reading consists of a higher number over a lower number, such as 110/72. The higher number (systolic) is the pressure inside your arteries when your heart pumps. The lower number (diastolic) is the pressure inside your arteries when your heart relaxes. Ideally you want your blood pressure below 120/80. Hypertension forces your heart to work harder to pump blood. Your arteries may become narrow or stiff. Having untreated or uncontrolled hypertension can cause heart attack, stroke, kidney disease, and other problems. RISK FACTORS Some risk factors for high blood pressure are controllable. Others are not.  Risk factors you cannot control include:   Race. You may be at higher risk if you are African American.  Age. Risk increases with age.  Gender. Men are at higher risk than women before age 45 years. After age 65, women are at higher risk than men. Risk factors you can control include:  Not getting enough exercise or physical activity.  Being overweight.  Getting too much fat, sugar, calories, or salt in your diet.  Drinking too much alcohol. SIGNS AND SYMPTOMS Hypertension does not usually cause signs or symptoms. Extremely high blood pressure (hypertensive crisis) may cause headache, anxiety, shortness of breath, and nosebleed. DIAGNOSIS To check if you have hypertension, your health care provider will measure your blood pressure while you are seated, with your arm held at the level of your heart. It should be measured at least twice using the same arm. Certain conditions can cause a difference in blood pressure between your right and left arms. A blood pressure reading that is higher than normal on one occasion does not mean that you need treatment. If  it is not clear whether you have high blood pressure, you may be asked to return on a different day to have your blood pressure checked again. Or, you may be asked to monitor your blood pressure at home for 1 or more weeks. TREATMENT Treating high blood pressure includes making lifestyle changes and possibly taking medicine. Living a healthy lifestyle can help lower high blood pressure. You may need to change some of your habits. Lifestyle changes may include:  Following the DASH diet. This diet is high in fruits, vegetables, and whole grains. It is low in salt, red meat, and added sugars.  Keep your sodium intake below 2,300 mg per day.  Getting at least 30-45 minutes of aerobic exercise at least 4 times per week.  Losing weight if necessary.  Not smoking.  Limiting alcoholic beverages.  Learning ways to reduce stress. Your health care provider may prescribe medicine if lifestyle changes are not enough to get your blood pressure under control, and if one of the following is true:  You are 18-59 years of age and your systolic blood pressure is above 140.  You are 60 years of age or older, and your systolic blood pressure is above 150.  Your diastolic blood pressure is above 90.  You have diabetes, and your systolic blood pressure is over 140 or your diastolic blood pressure is over 90.  You have kidney disease and your blood pressure is above 140/90.  You have heart disease and your blood pressure is above 140/90. Your personal target blood pressure may vary depending on your medical conditions, your age, and other factors. HOME CARE INSTRUCTIONS    Have your blood pressure rechecked as directed by your health care provider.   Take medicines only as directed by your health care provider. Follow the directions carefully. Blood pressure medicines must be taken as prescribed. The medicine does not work as well when you skip doses. Skipping doses also puts you at risk for  problems.  Do not smoke.   Monitor your blood pressure at home as directed by your health care provider. SEEK MEDICAL CARE IF:   You think you are having a reaction to medicines taken.  You have recurrent headaches or feel dizzy.  You have swelling in your ankles.  You have trouble with your vision. SEEK IMMEDIATE MEDICAL CARE IF:  You develop a severe headache or confusion.  You have unusual weakness, numbness, or feel faint.  You have severe chest or abdominal pain.  You vomit repeatedly.  You have trouble breathing. MAKE SURE YOU:   Understand these instructions.  Will watch your condition.  Will get help right away if you are not doing well or get worse.   This information is not intended to replace advice given to you by your health care provider. Make sure you discuss any questions you have with your health care provider.   Document Released: 07/19/2005 Document Revised: 12/03/2014 Document Reviewed: 05/11/2013 Elsevier Interactive Patient Education 2016 Elsevier Inc[employer]  

## 2015-09-02 NOTE — Progress Notes (Signed)
Patient is here today with a BP check. Patients BP 124/78 mmHg  Pulse 74

## 2015-09-03 LAB — BMP8+EGFR
BUN / CREAT RATIO: 17 (ref 11–26)
BUN: 11 mg/dL (ref 8–27)
CO2: 23 mmol/L (ref 18–29)
CREATININE: 0.65 mg/dL (ref 0.57–1.00)
Calcium: 9.5 mg/dL (ref 8.7–10.3)
Chloride: 92 mmol/L — ABNORMAL LOW (ref 96–106)
GFR calc non Af Amer: 91 mL/min/{1.73_m2} (ref >59)
GFR, EST AFRICAN AMERICAN: 105 mL/min/{1.73_m2} (ref >59)
Glucose: 89 mg/dL (ref 65–99)
Potassium: 4.4 mmol/L (ref 3.5–5.2)
Sodium: 135 mmol/L (ref 134–144)

## 2015-09-04 ENCOUNTER — Other Ambulatory Visit: Payer: Self-pay

## 2015-09-04 DIAGNOSIS — Z1231 Encounter for screening mammogram for malignant neoplasm of breast: Secondary | ICD-10-CM

## 2015-10-10 ENCOUNTER — Ambulatory Visit
Admission: RE | Admit: 2015-10-10 | Discharge: 2015-10-10 | Disposition: A | Discharge status: Home / Self Care | Payer: Medicare Other | Source: Ambulatory Visit

## 2015-10-10 DIAGNOSIS — Z1231 Encounter for screening mammogram for malignant neoplasm of breast: Secondary | ICD-10-CM

## 2015-10-10 LAB — HM MAMMOGRAPHY

## 2015-10-13 ENCOUNTER — Ambulatory Visit (INDEPENDENT_AMBULATORY_CARE_PROVIDER_SITE_OTHER): Payer: Medicare Other

## 2015-10-13 ENCOUNTER — Encounter: Payer: Self-pay | Admitting: Family Medicine

## 2015-10-13 ENCOUNTER — Ambulatory Visit (INDEPENDENT_AMBULATORY_CARE_PROVIDER_SITE_OTHER): Payer: Medicare Other | Admitting: Family Medicine

## 2015-10-13 VITALS — BP 117/73 | HR 75 | Temp 97.5°F | Ht 63.0 in | Wt 140.0 lb

## 2015-10-13 DIAGNOSIS — E785 Hyperlipidemia, unspecified: Secondary | ICD-10-CM | POA: Diagnosis not present

## 2015-10-13 DIAGNOSIS — K219 Gastro-esophageal reflux disease without esophagitis: Secondary | ICD-10-CM

## 2015-10-13 DIAGNOSIS — M25561 Pain in right knee: Secondary | ICD-10-CM

## 2015-10-13 DIAGNOSIS — I10 Essential (primary) hypertension: Secondary | ICD-10-CM | POA: Diagnosis not present

## 2015-10-13 DIAGNOSIS — E039 Hypothyroidism, unspecified: Secondary | ICD-10-CM | POA: Diagnosis not present

## 2015-10-13 DIAGNOSIS — Z1211 Encounter for screening for malignant neoplasm of colon: Secondary | ICD-10-CM

## 2015-10-13 DIAGNOSIS — E559 Vitamin D deficiency, unspecified: Secondary | ICD-10-CM

## 2015-10-13 MED ORDER — HYDROCHLOROTHIAZIDE 12.5 MG PO CAPS: 12.5000 mg | ORAL_CAPSULE | Freq: Every day | ORAL | Status: DC

## 2015-10-13 NOTE — Patient Instructions (Addendum)
Medicare Annual Wellness Visit  [place] and the medical providers at [facility] strive to bring you the best medical care.  In doing so we not only want to address your current medical conditions and concerns but also to detect new conditions early and prevent illness, disease and health-related problems.    Medicare offers a yearly Wellness Visit which allows our clinical staff to assess your need for preventative services including immunizations, lifestyle education, counseling to decrease risk of preventable diseases and screening for fall risk and other medical concerns.    This visit is provided free of charge (no copay) for all Medicare recipients. The clinical pharmacists at [facility] have begun to conduct these Wellness Visits which will also include a thorough review of all your medications.    As you primary medical provider recommend that you make an appointment for your Annual Wellness Visit if you have not done so already this year.  You may set up this appointment before you leave today or you may call back [phone] and schedule an appointment.  Please make sure when you call that you mention that you are scheduling your Annual Wellness Visit with the clinical pharmacist so that the appointment may be made for the proper length of time.     Continue current medications. Continue good therapeutic lifestyle changes which include good diet and exercise. Fall precautions discussed with patient. If an FOBT was given today- please return it to our front desk. If you are over 36 years old - you may need Prevnar 14 or the adult Pneumonia vaccine.  **Flu shots are available--- please call and schedule a FLU-CLINIC appointment**  After your visit with [place] today you will receive a survey in the mail or online from [employer] regarding your care with [place]. Please take a moment to fill this out. Your feedback is very  important to [place] as you can help [place] better understand your patient needs as well as improve your experience and satisfaction. WE CARE ABOUT YOU!!!   Continue ibuprofen with Tylenol or meloxicam as little as possible as currently doing We will call with x-ray results as soon as they become available and at that time we will discuss the use of gabapentin Continue to be careful and did not put yourself at risk for falling

## 2015-10-13 NOTE — Progress Notes (Signed)
Subjective:    Patient ID: Marissa Perez, female    DOB: [birth year], 70 y.o.   MRN: [id number]  HPI Pt here for follow up and management of chronic medical problems which includes hypertension and hyperlipidemia. She is taking medications regularly. The patient has a history of degenerative arthritis and scoliosis of the lumbar spine. She's been seeing orthopedic surgeon and getting injections for this. She has had some pain radiating down her right leg and recently it was below the knee. She notices a sensation of tightness and fullness in the knee at times. Following her last spine injection the pain that radiated to the knee was better but she still keeps having the fullness and pain below the knee. She has not had any additional injuries. She has a long history of foot problems on the right side also which are now improved. She denies any chest pain shortness of breath trouble swallowing heartburn indigestion nausea vomiting diarrhea blood in the stool or black tarry bowel movements. She does have a history of reflux and understands she has to be careful with taking NSAIDs with her stomach. She denies any problems passing her water.      Patient Active Problem List   Diagnosis Date Noted  . Hypothyroidism 12/11/2012  . Hyperlipidemia 12/11/2012  . Hypertension 12/11/2012  . GERD (gastroesophageal reflux disease) 12/11/2012   Outpatient Encounter Prescriptions as of 10/13/2015  Medication Sig  . aspirin (ASPIRIN LOW DOSE) 81 MG EC tablet Take 81 mg by mouth daily.    [name] Kitchen atorvastatin (LIPITOR) 20 MG tablet Take 1 tablet (20 mg total) by mouth daily.  [name] Kitchen BIOTIN PO Take by mouth.    . Calcium Citrate-Vitamin D (CALCIUM CITRATE + D3 MAXIMUM) 315-250 MG-UNIT TABS Take 1 tablet by mouth 2 (two) times daily.  . Cholecalciferol (VITAMIN D PO) Take 5,000 Units by mouth daily.  . clobetasol (TEMOVATE) 0.05 % external solution   . ezetimibe (ZETIA) 10 MG tablet Take 1 tablet (10 mg total) by mouth  daily.  . hydrochlorothiazide (MICROZIDE) 12.5 MG capsule Take 1 capsule (12.5 mg total) by mouth daily.  [name] Kitchen levothyroxine (SYNTHROID, LEVOTHROID) 50 MCG tablet TAKE 1/2 TAB ON MON, WED, FRI & 1 TAB ON TUES AND THURS  . Magnesium 100 MG CAPS Take 1 capsule by mouth daily.  . meloxicam (MOBIC) 15 MG tablet TAKE 1 TABLET ONCE A DAY (Patient taking differently: TAKE 1 TABLET ONCE A DAY as needed)  . Multiple Vitamin (MULTI-VITAMIN PO) Take by mouth daily.    [name] Kitchen OVER THE COUNTER MEDICATION Take 4 capsules by mouth daily. JR Carlson's Elite Omega Fish oil  . PREMARIN vaginal cream USE 1 GM VAGINALLY 2 TO 3 TIMES A WEEK  . valsartan (DIOVAN) 320 MG tablet TAKE (1) TABLET DAILY AS DIRECTED.  . [DISCONTINUED] hydrochlorothiazide (MICROZIDE) 12.5 MG capsule Take 1 capsule (12.5 mg total) by mouth daily.  [name] Kitchen esomeprazole (NEXIUM) 20 MG capsule Take 20 mg by mouth daily at 12 noon. Reported on 10/13/2015   No facility-administered encounter medications on file as of 10/13/2015.       Review of Systems  Constitutional: Negative.   HENT: Negative.   Eyes: Negative.   Respiratory: Negative.   Cardiovascular: Negative.   Gastrointestinal: Negative.   Endocrine: Negative.   Genitourinary: Negative.   Musculoskeletal: Positive for arthralgias (right "behind the knee pain" and buttock pain).  Skin: Negative.   Allergic/Immunologic: Negative.   Neurological: Negative.   Hematological: Negative.   Psychiatric/Behavioral: Negative.  Objective:   Physical Exam  Constitutional: She is oriented to person, place, and time. She appears well-developed and well-nourished. No distress.  HENT:  Head: Normocephalic.  Right Ear: External ear normal.  Left Ear: External ear normal.  Nose: Nose normal.  Mouth/Throat: Oropharynx is clear and moist.  Eyes: Conjunctivae and EOM are normal. Pupils are equal, round, and reactive to light. Right eye exhibits no discharge. Left eye exhibits no discharge. No  scleral icterus.  Neck: Normal range of motion. Neck supple. No thyromegaly present.  Cardiovascular: Normal rate, regular rhythm, normal heart sounds and intact distal pulses.   No murmur heard. The rhythm is regular at 84/m  Pulmonary/Chest: Effort normal and breath sounds normal. No respiratory distress. She has no wheezes. She has no rales.  Clear anteriorly and posteriorly  Abdominal: Soft. Bowel sounds are normal. She exhibits no mass. There is no tenderness. There is no rebound and no guarding.  No abdominal tenderness or organ enlargement  Musculoskeletal: Normal range of motion. She exhibits tenderness. She exhibits no edema.  Fairly good extension and flexion of the right knee with minimal tenderness in the right lateral joint line with extension and internal rotation  Lymphadenopathy:    She has no cervical adenopathy.  Neurological: She is alert and oriented to person, place, and time. No cranial nerve deficit.  Reflexes are slightly decreased in the right lower extremity compared to the left  Skin: Skin is warm and dry. No rash noted.  Psychiatric: She has a normal mood and affect. Her behavior is normal. Judgment and thought content normal.  Nursing note and vitals reviewed.  BP 117/73 mmHg  Pulse 75  Temp(Src) 97.5 F (36.4 C) (Oral)  Ht 5' 3"  (1.6 m)  Wt 140 lb (63.504 kg)  BMI 24.81 kg/m2  [facility] reading (PRIMARY) by  Dr. Brunilda Payor x-ray and right knee films--results pending                                        Assessment & Plan:  1. Essential hypertension -Blood pressure is good today and patient will continue with current treatment - BMP8+EGFR - CBC with Differential/Platelet - Hepatic function panel - DG Chest 2 View; Future  2. Hyperlipidemia -Continue with current treatment pending results of lab work - CBC with Differential/Platelet - NMR, lipoprofile  3. Hypothyroidism, unspecified hypothyroidism type -Continue with current treatment pending  results of lab work - CBC with Differential/Platelet  4. Vitamin D deficiency -Continue vitamin D - CBC with Differential/Platelet - VITAMIN D 25 Hydroxy (Vit-D Deficiency, Fractures)  5. Gastroesophageal reflux disease, esophagitis presence not specified -Consider protecting the stoma because of taking NSAIDs with Zantac before eating and taking NSAIDs after eating - CBC with Differential/Platelet  6. Special screening for malignant neoplasms, colon -Return the FOBT - Fecal occult blood, imunochemical; Future  7. Right knee pain -We will look at these films and make a decision if we need to go further with more x-rays of this and will most likely get orthopedic surgeon to follow up on this pending results of these x-rays -We will consider at the patient's request to try gabapentin but we'll wait till x-ray results are returned first - DG Knee 1-2 Views Right; Future  Meds ordered this encounter  Medications  . hydrochlorothiazide (MICROZIDE) 12.5 MG capsule    Sig: Take 1 capsule (12.5 mg total) by mouth daily.  Dispense:  90 capsule    Refill:  3   Patient Instructions                       Medicare Annual Wellness Visit  [place] and the medical providers at [facility] strive to bring you the best medical care.  In doing so we not only want to address your current medical conditions and concerns but also to detect new conditions early and prevent illness, disease and health-related problems.    Medicare offers a yearly Wellness Visit which allows our clinical staff to assess your need for preventative services including immunizations, lifestyle education, counseling to decrease risk of preventable diseases and screening for fall risk and other medical concerns.    This visit is provided free of charge (no copay) for all Medicare recipients. The clinical pharmacists at [facility] have begun to conduct these Wellness Visits  which will also include a thorough review of all your medications.    As you primary medical provider recommend that you make an appointment for your Annual Wellness Visit if you have not done so already this year.  You may set up this appointment before you leave today or you may call back [phone]) and schedule an appointment.  Please make sure when you call that you mention that you are scheduling your Annual Wellness Visit with the clinical pharmacist so that the appointment may be made for the proper length of time.     Continue current medications. Continue good therapeutic lifestyle changes which include good diet and exercise. Fall precautions discussed with patient. If an FOBT was given today- please return it to our front desk. If you are over 69 years old - you may need Prevnar 14 or the adult Pneumonia vaccine.  **Flu shots are available--- please call and schedule a FLU-CLINIC appointment**  After your visit with [place] today you will receive a survey in the mail or online from [employer] regarding your care with [place]. Please take a moment to fill this out. Your feedback is very important to [place] as you can help [place] better understand your patient needs as well as improve your experience and satisfaction. WE CARE ABOUT YOU!!!   Continue ibuprofen with Tylenol or meloxicam as little as possible as currently doing We will call with x-ray results as soon as they become available and at that time we will discuss the use of gabapentin Continue to be careful and did not put yourself at risk for falling   Arrie Senate MD

## 2015-10-14 ENCOUNTER — Other Ambulatory Visit: Payer: Medicare Other

## 2015-10-14 DIAGNOSIS — Z1211 Encounter for screening for malignant neoplasm of colon: Secondary | ICD-10-CM

## 2015-10-14 LAB — CBC WITH DIFFERENTIAL/PLATELET
BASOS: 1 %
Basophils Absolute: 0.1 10*3/uL (ref 0.0–0.2)
EOS (ABSOLUTE): 0.1 10*3/uL (ref 0.0–0.4)
EOS: 3 %
HEMATOCRIT: 37.6 % (ref 34.0–46.6)
Hemoglobin: 12.6 g/dL (ref 11.1–15.9)
IMMATURE GRANS (ABS): 0 10*3/uL (ref 0.0–0.1)
IMMATURE GRANULOCYTES: 0 %
LYMPHS: 39 %
Lymphocytes Absolute: 1.7 10*3/uL (ref 0.7–3.1)
MCH: 30.4 pg (ref 26.6–33.0)
MCHC: 33.5 g/dL (ref 31.5–35.7)
MCV: 91 fL (ref 79–97)
MONOS ABS: 0.3 10*3/uL (ref 0.1–0.9)
Monocytes: 6 %
NEUTROS ABS: 2.2 10*3/uL (ref 1.4–7.0)
NEUTROS PCT: 51 %
PLATELETS: 349 10*3/uL (ref 150–379)
RBC: 4.15 x10E6/uL (ref 3.77–5.28)
RDW: 13.5 % (ref 12.3–15.4)
WBC: 4.3 10*3/uL (ref 3.4–10.8)

## 2015-10-14 LAB — BMP8+EGFR
BUN/Creatinine Ratio: 17 (ref 11–26)
BUN: 12 mg/dL (ref 8–27)
CO2: 21 mmol/L (ref 18–29)
CREATININE: 0.72 mg/dL (ref 0.57–1.00)
Calcium: 9.7 mg/dL (ref 8.7–10.3)
Chloride: 93 mmol/L — ABNORMAL LOW (ref 96–106)
GFR, EST AFRICAN AMERICAN: 99 mL/min/{1.73_m2} (ref >59)
GFR, EST NON AFRICAN AMERICAN: 86 mL/min/{1.73_m2} (ref >59)
GLUCOSE: 87 mg/dL (ref 65–99)
POTASSIUM: 5.1 mmol/L (ref 3.5–5.2)
SODIUM: 137 mmol/L (ref 134–144)

## 2015-10-14 LAB — NMR, LIPOPROFILE
Cholesterol: 153 mg/dL (ref 100–199)
HDL Cholesterol by NMR: 75 mg/dL (ref >39)
HDL Particle Number: 46.5 umol/L (ref >=30.5)
LDL PARTICLE NUMBER: 734 nmol/L (ref <1000)
LDL SIZE: 21 nm (ref >20.5)
LDL-C: 61 mg/dL (ref 0–99)
LP-IR SCORE: 40 (ref <=45)
SMALL LDL PARTICLE NUMBER: 408 nmol/L (ref <=527)
TRIGLYCERIDES BY NMR: 87 mg/dL (ref 0–149)

## 2015-10-14 LAB — HEPATIC FUNCTION PANEL
ALT: 30 IU/L (ref 0–32)
AST: 26 IU/L (ref 0–40)
Albumin: 5.1 g/dL — ABNORMAL HIGH (ref 3.6–4.8)
Alkaline Phosphatase: 63 IU/L (ref 39–117)
BILIRUBIN TOTAL: 0.4 mg/dL (ref 0.0–1.2)
BILIRUBIN, DIRECT: 0.13 mg/dL (ref 0.00–0.40)
Total Protein: 7.4 g/dL (ref 6.0–8.5)

## 2015-10-14 LAB — VITAMIN D 25 HYDROXY (VIT D DEFICIENCY, FRACTURES): VIT D 25 HYDROXY: 52.9 ng/mL (ref 30.0–100.0)

## 2015-10-15 ENCOUNTER — Other Ambulatory Visit: Payer: Self-pay | Admitting: *Deleted

## 2015-10-15 DIAGNOSIS — M25561 Pain in right knee: Secondary | ICD-10-CM

## 2015-10-15 LAB — FECAL OCCULT BLOOD, IMMUNOCHEMICAL: Fecal Occult Bld: NEGATIVE

## 2015-10-16 ENCOUNTER — Encounter: Payer: Self-pay | Admitting: Family Medicine

## 2015-10-17 ENCOUNTER — Other Ambulatory Visit: Payer: Self-pay | Admitting: *Deleted

## 2015-10-17 LAB — HM MAMMOGRAPHY

## 2015-10-17 MED ORDER — GABAPENTIN 100 MG PO CAPS: ORAL_CAPSULE | ORAL | Status: DC

## 2015-10-29 ENCOUNTER — Telehealth: Payer: Self-pay | Admitting: Family Medicine

## 2015-10-29 NOTE — Telephone Encounter (Signed)
Report printed and left detailed message stating it was ready for pick up and to CB with any further questions or concerns.

## 2015-12-12 ENCOUNTER — Ambulatory Visit (INDEPENDENT_AMBULATORY_CARE_PROVIDER_SITE_OTHER): Payer: Medicare Other | Admitting: Pharmacist

## 2015-12-12 ENCOUNTER — Encounter: Payer: Self-pay | Admitting: Pharmacist

## 2015-12-12 VITALS — BP 136/78 | HR 72 | Ht 62.0 in | Wt 140.5 lb

## 2015-12-12 DIAGNOSIS — Z0000 Encounter for general adult medical examination without abnormal findings: Secondary | ICD-10-CM

## 2015-12-12 NOTE — Patient Instructions (Addendum)
Marissa Perez , Thank you for taking time to come for your Medicare Wellness Visit. I appreciate your ongoing commitment to your health goals. Please review the following plan we discussed and let me know if I can assist you in the future.   These are the goals we discussed: Continue to eat a variety of fruits and vegetables, lean proteins (fish, chicken, [place], low fat [place] yogurt, beans, nuts), and whole grains.  Continue to walk daily - try some of the chair exercises for core strengthening.    This is a list of the screening recommended for you and due dates:  Health Maintenance  Topic Date Due  .  Hepatitis C: One time screening is recommended by Center for Disease Control  (CDC) for  adults born from [birth year] through 1965.   01/07/2016*  . Flu Shot  03/02/2016  . Stool Blood Test  10/13/2016  . Mammogram  10/09/2017  . Tetanus Vaccine  02/10/2021  . Colon Cancer Screening  10/11/2023  . DEXA scan (bone density measurement)  06/08/2025  . Shingles Vaccine  Completed  . Pneumonia vaccines  Completed  *Topic was postponed. The date shown is not the original due date.    Health Maintenance, Female Adopting a healthy lifestyle and getting preventive care can go a long way to promote health and wellness. Talk with your health care provider about what schedule of regular examinations is right for you. This is a good chance for you to check in with your provider about disease prevention and staying healthy. In between checkups, there are plenty of things you can do on your own. Experts have done a lot of research about which lifestyle changes and preventive measures are most likely to keep you healthy. Ask your health care provider for more information. WEIGHT AND DIET  Eat a healthy diet  Be sure to include plenty of vegetables, fruits, low-fat dairy products, and lean protein.  Do not eat a lot of foods high in solid fats, added sugars, or salt.  Get regular exercise. This is one of the  most important things you can do for your health.  Most adults should exercise for at least 150 minutes each week. The exercise should increase your heart rate and make you sweat (moderate-intensity exercise).  Most adults should also do strengthening exercises at least twice a week. This is in addition to the moderate-intensity exercise.  Maintain a healthy weight  Body mass index (BMI) is a measurement that can be used to identify possible weight problems. It estimates body fat based on height and weight. Your health care provider can help determine your BMI and help you achieve or maintain a healthy weight.  For females 38 years of age and older:   A BMI below 18.5 is considered underweight.  A BMI of 18.5 to 24.9 is normal.  A BMI of 25 to 29.9 is considered overweight.  A BMI of 30 and above is considered obese.  Watch levels of cholesterol and blood lipids  You should start having your blood tested for lipids and cholesterol at 70 years of age, then have this test every 5 years.  You may need to have your cholesterol levels checked more often if:  Your lipid or cholesterol levels are high.  You are older than 70 years of age.  You are at high risk for heart disease.  CANCER SCREENING   Lung Cancer  Lung cancer screening is recommended for adults 18-64 years old who are at high  risk for lung cancer because of a history of smoking.  A yearly low-dose CT scan of the lungs is recommended for people who:  Currently smoke.  Have quit within the past 15 years.  Have at least a 30-pack-year history of smoking. A pack year is smoking an average of one pack of cigarettes a day for 1 year.  Yearly screening should continue until it has been 15 years since you quit.  Yearly screening should stop if you develop a health problem that would prevent you from having lung cancer treatment.  Breast Cancer  Practice breast self-awareness. This means understanding how your  breasts normally appear and feel.  It also means doing regular breast self-exams. Let your health care provider know about any changes, no matter how small.  If you are in your 20s or 30s, you should have a clinical breast exam (CBE) by a health care provider every 1-3 years as part of a regular health exam.  If you are 34 or older, have a CBE every year. Also consider having a breast X-ray (mammogram) every year.  If you have a family history of breast cancer, talk to your health care provider about genetic screening.  If you are at high risk for breast cancer, talk to your health care provider about having an MRI and a mammogram every year.  Breast cancer gene (BRCA) assessment is recommended for women who have family members with BRCA-related cancers. BRCA-related cancers include:  Breast.  Ovarian.  Tubal.  Peritoneal cancers.  Results of the assessment will determine the need for genetic counseling and BRCA1 and BRCA2 testing. Cervical Cancer Your health care provider may recommend that you be screened regularly for cancer of the pelvic organs (ovaries, uterus, and vagina). This screening involves a pelvic examination, including checking for microscopic changes to the surface of your cervix (Pap test). You may be encouraged to have this screening done every 3 years, beginning at age 69.  For women ages 62-65, health care providers may recommend pelvic exams and Pap testing every 3 years, or they may recommend the Pap and pelvic exam, combined with testing for human papilloma virus (HPV), every 5 years. Some types of HPV increase your risk of cervical cancer. Testing for HPV may also be done on women of any age with unclear Pap test results.  Other health care providers may not recommend any screening for nonpregnant women who are considered low risk for pelvic cancer and who do not have symptoms. Ask your health care provider if a screening pelvic exam is right for you.  If you  have had past treatment for cervical cancer or a condition that could lead to cancer, you need Pap tests and screening for cancer for at least 20 years after your treatment. If Pap tests have been discontinued, your risk factors (such as having a new sexual partner) need to be reassessed to determine if screening should resume. Some women have medical problems that increase the chance of getting cervical cancer. In these cases, your health care provider may recommend more frequent screening and Pap tests. Colorectal Cancer  This type of cancer can be detected and often prevented.  Routine colorectal cancer screening usually begins at 70 years of age and continues through 70 years of age.  Your health care provider may recommend screening at an earlier age if you have risk factors for colon cancer.  Your health care provider may also recommend using home test kits to check for hidden blood in  the stool.  A small camera at the end of a tube can be used to examine your colon directly (sigmoidoscopy or colonoscopy). This is done to check for the earliest forms of colorectal cancer.  Routine screening usually begins at age 24.  Direct examination of the colon should be repeated every 5-10 years through 70 years of age. However, you may need to be screened more often if early forms of precancerous polyps or small growths are found. Skin Cancer  Check your skin from head to toe regularly.  Tell your health care provider about any new moles or changes in moles, especially if there is a change in a mole's shape or color.  Also tell your health care provider if you have a mole that is larger than the size of a pencil eraser.  Always use sunscreen. Apply sunscreen liberally and repeatedly throughout the day.  Protect yourself by wearing long sleeves, pants, a wide-brimmed hat, and sunglasses whenever you are outside. HEART DISEASE, DIABETES, AND HIGH BLOOD PRESSURE   High blood pressure causes  heart disease and increases the risk of stroke. High blood pressure is more likely to develop in:  People who have blood pressure in the high end of the normal range (130-139/85-89 mm Hg).  People who are overweight or obese.  People who are African American.  If you are 63-54 years of age, have your blood pressure checked every 3-5 years. If you are 12 years of age or older, have your blood pressure checked every year. You should have your blood pressure measured twice--once when you are at a hospital or clinic, and once when you are not at a hospital or clinic. Record the average of the two measurements. To check your blood pressure when you are not at a hospital or clinic, you can use:  An automated blood pressure machine at a pharmacy.  A home blood pressure monitor.  If you are between 37 years and 53 years old, ask your health care provider if you should take aspirin to prevent strokes.  Have regular diabetes screenings. This involves taking a blood sample to check your fasting blood sugar level.  If you are at a normal weight and have a low risk for diabetes, have this test once every three years after 70 years of age.  If you are overweight and have a high risk for diabetes, consider being tested at a younger age or more often. PREVENTING INFECTION  Hepatitis B  If you have a higher risk for hepatitis B, you should be screened for this virus. You are considered at high risk for hepatitis B if:  You were born in a country where hepatitis B is common. Ask your health care provider which countries are considered high risk.  Your parents were born in a high-risk country, and you have not been immunized against hepatitis B (hepatitis B vaccine).  You have HIV or AIDS.  You use needles to inject street drugs.  You live with someone who has hepatitis B.  You have had sex with someone who has hepatitis B.  You get hemodialysis treatment.  You take certain medicines for  conditions, including cancer, organ transplantation, and autoimmune conditions. Hepatitis C  Blood testing is recommended for:  Everyone born from [birth year] through 1965.  Anyone with known risk factors for hepatitis C. Sexually transmitted infections (STIs)  You should be screened for sexually transmitted infections (STIs) including gonorrhea and chlamydia if:  You are sexually active and are younger than  70 years of age.  You are older than 70 years of age and your health care provider tells you that you are at risk for this type of infection.  Your sexual activity has changed since you were last screened and you are at an increased risk for chlamydia or gonorrhea. Ask your health care provider if you are at risk.  If you do not have HIV, but are at risk, it may be recommended that you take a prescription medicine daily to prevent HIV infection. This is called pre-exposure prophylaxis (PrEP). You are considered at risk if:  You are sexually active and do not regularly use condoms or know the HIV status of your partner(s).  You take drugs by injection.  You are sexually active with a partner who has HIV. Talk with your health care provider about whether you are at high risk of being infected with HIV. If you choose to begin PrEP, you should first be tested for HIV. You should then be tested every 3 months for as long as you are taking PrEP.  PREGNANCY   If you are premenopausal and you may become pregnant, ask your health care provider about preconception counseling.  If you may become pregnant, take 400 to 800 micrograms (mcg) of folic acid every day.  If you want to prevent pregnancy, talk to your health care provider about birth control (contraception). OSTEOPOROSIS AND MENOPAUSE   Osteoporosis is a disease in which the bones lose minerals and strength with aging. This can result in serious bone fractures. Your risk for osteoporosis can be identified using a bone density scan.  If  you are 51 years of age or older, or if you are at risk for osteoporosis and fractures, ask your health care provider if you should be screened.  Ask your health care provider whether you should take a calcium or vitamin D supplement to lower your risk for osteoporosis.  Menopause may have certain physical symptoms and risks.  Hormone replacement therapy may reduce some of these symptoms and risks. Talk to your health care provider about whether hormone replacement therapy is right for you.  HOME CARE INSTRUCTIONS   Schedule regular health, dental, and eye exams.  Stay current with your immunizations.   Do not use any tobacco products including cigarettes, chewing tobacco, or electronic cigarettes.  If you are pregnant, do not drink alcohol.  If you are breastfeeding, limit how much and how often you drink alcohol.  Limit alcohol intake to no more than 1 drink per day for nonpregnant women. One drink equals 12 ounces of beer, 5 ounces of wine, or 1 ounces of hard liquor.  Do not use street drugs.  Do not share needles.  Ask your health care provider for help if you need support or information about quitting drugs.  Tell your health care provider if you often feel depressed.  Tell your health care provider if you have ever been abused or do not feel safe at home.   This information is not intended to replace advice given to you by your health care provider. Make sure you discuss any questions you have with your health care provider.   Document Released: 02/01/2011 Document Revised: 08/09/2014 Document Reviewed: 06/20/2013 Elsevier Interactive Patient Education [employer].

## 2015-12-12 NOTE — Progress Notes (Signed)
Patient ID: Marissa Perez, female   DOB: [birth year], 70 y.o.   MRN: [id number]    Subjective:   Marissa Perez is a 70 y.o. female who presents for an Initial Medicare Annual Wellness Visit. Marissa Perez is married and lives in [place] with her husband.  She is retired from [employer].   Review of Systems  Review of Systems  Constitutional: Negative.   HENT: Negative.   Eyes: Negative.   Respiratory: Negative.   Cardiovascular: Negative.   Gastrointestinal: Negative.   Genitourinary: Negative for dysuria and urgency.       Vaginal dryness - has tried OTC products and is using Premarin cream without adequate relief   Musculoskeletal: Positive for joint pain (knee pain which has improved since steroid injection a few weeks ago).  Skin: Negative.   Neurological: Negative.   Endo/Heme/Allergies: Negative.   Psychiatric/Behavioral: Negative.      Current Medications (verified) Outpatient Encounter Prescriptions as of 12/12/2015  Medication Sig  . aspirin (ASPIRIN LOW DOSE) 81 MG EC tablet Take 81 mg by mouth daily.    [name] Kitchen atorvastatin (LIPITOR) 20 MG tablet Take 1 tablet (20 mg total) by mouth daily.  [name] Kitchen BIOTIN PO Take by mouth.    . Calcium Citrate-Vitamin D (CALCIUM CITRATE + D3 MAXIMUM) 315-250 MG-UNIT TABS Take 1 tablet by mouth 2 (two) times daily.  . Cholecalciferol (VITAMIN D PO) Take 5,000 Units by mouth daily.  . clobetasol (TEMOVATE) 0.05 % external solution   . Cyanocobalamin (B-12) 1000 MCG SUBL Place under the tongue.  . ezetimibe (ZETIA) 10 MG tablet Take 1 tablet (10 mg total) by mouth daily.  [name] Kitchen gabapentin (NEURONTIN) 100 MG capsule Take 1-2 caps QHS  . hydrochlorothiazide (MICROZIDE) 12.5 MG capsule Take 1 capsule (12.5 mg total) by mouth daily.  [name] Kitchen levothyroxine (SYNTHROID, LEVOTHROID) 50 MCG tablet TAKE 1/2 TAB ON MON, WED, FRI & 1 TAB ALL OTHER DAYS  . Magnesium 400 MG TABS Take 400 mg by mouth daily.  . meloxicam (MOBIC) 15 MG tablet  TAKE 1 TABLET ONCE A DAY (Patient taking differently: TAKE 1 TABLET ONCE A DAY as needed)  . Multiple Vitamin (MULTI-VITAMIN PO) Take by mouth daily.    [name] Kitchen OVER THE COUNTER MEDICATION Take 4 capsules by mouth daily. JR Carlson's Elite Omega Fish oil  . PREMARIN vaginal cream USE 1 GM VAGINALLY 2 TO 3 TIMES A WEEK  . Probiotic Product (PROBIOTIC DAILY PO) Take by mouth.  . ranitidine (ZANTAC) 150 MG tablet Take 150 mg by mouth every morning.  . valsartan (DIOVAN) 320 MG tablet TAKE (1) TABLET DAILY AS DIRECTED.  . [DISCONTINUED] esomeprazole (NEXIUM) 20 MG capsule Take 20 mg by mouth daily at 12 noon. Reported on 12/12/2015  . [DISCONTINUED] levothyroxine (SYNTHROID, LEVOTHROID) 50 MCG tablet TAKE 1/2 TAB ON MON, WED, FRI & 1 TAB ON TUES AND THURS (Patient taking differently: TAKE 1/2 TAB ON MON, WED, FRI & 1 TAB ALL OTHER DAYS)  . [DISCONTINUED] Magnesium 100 MG CAPS Take 1 capsule by mouth daily.   No facility-administered encounter medications on file as of 12/12/2015.    Allergies (verified) Femring; Codeine; Erythromycin; Cephalexin; and Penicillins   History: Past Medical History  Diagnosis Date  . IBS (irritable bowel syndrome)   . Hyperlipidemia   . Diverticulosis   . Acid reflux   . Hypothyroidism   . History of ETT 7/10    abnormal   . Hypertension   . History of stomach  ulcers 2008    positive h. pylori  . Cancer ([place])     SKIN  . Allergy    Past Surgical History  Procedure Laterality Date  . Cesarean section  1972  . Tonsillectomy  1951  . Total abdominal hysterectomy w/ bilateral salpingoophorectomy  1990    Dr. Tamala Julian / fibroids   . Mohs surgery     Family History  Problem Relation Age of Onset  . Diabetes Father   . Heart disease Father   . Heart attack Father   . Breast cancer Mother   . Breast cancer Paternal Grandmother     Aunt also   . Breast cancer      Family History  . Colon cancer Neg Hx    Social History   Occupational History  . Not on  file.   Social History Main Topics  . Smoking status: Never Smoker   . Smokeless tobacco: Never Used  . Alcohol Use: 8.4 oz/week    14 Glasses of wine per week  . Drug Use: No  . Sexual Activity: No    Do you feel safe at home?  Yes Are there smokers in your home (other than you)? No  Dietary issues and exercise activities: Current Exercise Habits: Home exercise routine, Type of exercise: walking, Time (Minutes): 40, Frequency (Times/Week): 5, Weekly Exercise (Minutes/Week): 200, Intensity: Moderate  Current Dietary habits:  Tried to limit fat in diet. Eats lots of vegetables and fruits.     Objective:    Today's Vitals   12/12/15 1228  BP: 136/78  Pulse: 72  Height: 5\' 2"  (1.575 m)  Weight: 140 lb 8 oz (63.73 kg)  PainSc: 0-No pain   Body mass index is 25.69 kg/(m^2).  Activities of Daily Living In your present state of health, do you have any difficulty performing the following activities: 12/12/2015  Hearing? N  Vision? N  Difficulty concentrating or making decisions? N  Walking or climbing stairs? N  Dressing or bathing? N  Doing errands, shopping? N  Preparing Food and eating ? N  Using the Toilet? N  In the past six months, have you accidently leaked urine? N  Do you have problems with loss of bowel control? N  Managing your Medications? N  Managing your Finances? N  Housekeeping or managing your Housekeeping? N     Cardiac Risk Factors include: advanced age (>63men, >75 women);dyslipidemia;hypertension  Depression Screen PHQ 2/9 Scores 12/12/2015 10/13/2015 06/09/2015 02/05/2015  PHQ - 2 Score 0 0 0 0     Fall Risk Fall Risk  12/12/2015 10/13/2015 06/09/2015 02/05/2015 09/24/2014  Falls in the past year? No No No No No    Cognitive Function: MMSE - Mini Mental State Exam 12/12/2015  Orientation to time 5  Orientation to Place 5  Registration 3  Attention/ Calculation 5  Recall 3  Language- name 2 objects 2  Language- repeat 1  Language- follow 3 step  command 3  Language- read & follow direction 1  Write a sentence 1  Copy design 1  Total score 30    Immunizations and Health Maintenance Immunization History  Administered Date(s) Administered  . Influenza Split 05/21/2013  . Influenza,inj,Quad PF,36+ Mos 05/02/2015  . Influenza,inj,quad, With Preservative 05/10/2014  . Pneumococcal Conjugate-13 08/29/2013   There are no preventive care reminders to display for this patient.  Patient Care Team: Chipper Herb, MD as PCP - General (Family Medicine) Gaynelle Arabian, MD as Consulting Physician (Orthopedic Surgery) Suella Broad,  MD as Consulting Physician (Physical Medicine and Rehabilitation) Druscilla Brownie, MD as Consulting Physician (Dermatology) Dyke Maes, OD as Consulting Physician (Optometry)  Indicate any recent Medical Services you may have received from other than [facility] providers in the past year (date may be approximate).    Assessment:    Annual Wellness Visit    Screening Tests Health Maintenance  Topic Date Due  . Hepatitis C Screening  01/07/2016 (Originally Aug 03, 1945)  . INFLUENZA VACCINE  03/02/2016  . COLON CANCER SCREENING ANNUAL FOBT  10/13/2016  . MAMMOGRAM  10/09/2017  . TETANUS/TDAP  02/10/2021  . COLONOSCOPY  10/11/2023  . DEXA SCAN  06/08/2025  . ZOSTAVAX  Completed  . PNA vac Low Risk Adult  Completed        Plan:   During the course of the visit Breuna was educated and counseled about the following appropriate screening and preventive services:   Vaccines to include Pneumoccal, Influenza, Td, Zostavax - ALL VACCINES UTD   Colorectal cancer screening - colonoscopy and FOBT are all UTD  Cardiovascular disease screening - consider EKG at next appt with PCP  Diabetes screening - UTD  Bone Denisty / Osteoporosis Screening - UTD - last DEXA was WNL  Mammogram - UTD  PAP - last was 10/2014  Glaucoma screening / Diabetic Eye Exam - UTD  Nutrition counseling - continue to eat a  variety of fruits and vegetables, lean proteins and whole grains  Advanced Directives - UTD , patient to bring in copy for chart  Physical Activity - continue to walk.  We did discuss trying to do some strength exercises for legs and core - gave some example that she can do while sitting.  Will discuss possible alterative therapies for patient vaginal dryness with her PCP   Patient Instructions (the written plan) were given to the patient.   Cherre Robins, [facility]   12/12/2015

## 2015-12-31 ENCOUNTER — Other Ambulatory Visit: Payer: Self-pay | Admitting: Family Medicine

## 2016-01-27 ENCOUNTER — Ambulatory Visit (INDEPENDENT_AMBULATORY_CARE_PROVIDER_SITE_OTHER): Payer: Medicare Other | Admitting: Family Medicine

## 2016-01-27 ENCOUNTER — Encounter: Payer: Self-pay | Admitting: Family Medicine

## 2016-01-27 VITALS — BP 121/77 | HR 74 | Temp 97.6°F | Ht 62.0 in | Wt 142.8 lb

## 2016-01-27 DIAGNOSIS — S50912A Unspecified superficial injury of left forearm, initial encounter: Secondary | ICD-10-CM | POA: Diagnosis not present

## 2016-01-27 DIAGNOSIS — T148XXA Other injury of unspecified body region, initial encounter: Secondary | ICD-10-CM

## 2016-01-27 NOTE — Patient Instructions (Signed)
Keep covered when in areas that there is a concern for infection or bumping and other people. Otherwise keep uncovered and let it air dry so that it can heal. May use topical antibiotics as needed. Return if worsens or if signs of infection start up.

## 2016-01-27 NOTE — Progress Notes (Signed)
BP 121/77 mmHg  Pulse 74  Temp(Src) 97.6 F (36.4 C) (Oral)  Ht 5\' 2"  (1.575 m)  Wt 142 lb 12.8 oz (64.774 kg)  BMI 26.11 kg/m2   Subjective:    Patient ID: Marissa Perez, female    DOB: 1945-11-04, 70 y.o.   MRN: [id number]  HPI: Marissa Perez is a 70 y.o. female presenting on 01/27/2016 for left forearm abraisions   HPI Left forearm abrasion Patient was on the beach Sunday and tripped and fell on [place] and has a very large abrasion on her left forearm and elbow. She has been covering it and putting topical antibiotic on it overnight and during the day. She says it is not draining anymore. She denies any fevers or chills or redness or warmth. The site is very tender still and still has some swelling and bruising surrounding her elbow.  Relevant past medical, surgical, family and social history reviewed and updated as indicated. Interim medical history since our last visit reviewed. Allergies and medications reviewed and updated.  Review of Systems  Constitutional: Negative for fever and chills.  HENT: Negative for congestion, ear discharge and ear pain.   Eyes: Negative for redness and visual disturbance.  Respiratory: Negative for chest tightness and shortness of breath.   Cardiovascular: Negative for chest pain and leg swelling.  Genitourinary: Negative for dysuria and difficulty urinating.  Musculoskeletal: Negative for back pain and gait problem.  Skin: Positive for wound. Negative for rash.  Neurological: Negative for light-headedness and headaches.  Psychiatric/Behavioral: Negative for behavioral problems and agitation.  All other systems reviewed and are negative.   Per HPI unless specifically indicated above     Medication List       This list is accurate as of: 01/27/16  6:11 PM.  Always use your most recent med list.               ASPIRIN LOW DOSE 81 MG EC tablet  Generic drug:  aspirin  Take 81 mg by mouth daily.     atorvastatin 20 MG  tablet  Commonly known as:  LIPITOR  Take 1 tablet (20 mg total) by mouth daily.     B-12 1000 MCG Subl  Place under the tongue.     BIOTIN PO  Take by mouth.     Calcium Citrate-Vitamin D 315-250 MG-UNIT Tabs  Commonly known as:  CALCIUM CITRATE + D3 MAXIMUM  Take 1 tablet by mouth 2 (two) times daily.     clobetasol 0.05 % external solution  Commonly known as:  TEMOVATE     ezetimibe 10 MG tablet  Commonly known as:  ZETIA  Take 1 tablet (10 mg total) by mouth daily.     gabapentin 100 MG capsule  Commonly known as:  NEURONTIN  Take 1-2 caps QHS     hydrochlorothiazide 12.5 MG capsule  Commonly known as:  MICROZIDE  Take 1 capsule (12.5 mg total) by mouth daily.     levothyroxine 50 MCG tablet  Commonly known as:  SYNTHROID, LEVOTHROID  TAKE 1/2 TAB ON MON, WED, FRI & 1 TAB ALL OTHER DAYS     Magnesium 400 MG Tabs  Take 400 mg by mouth daily.     meloxicam 15 MG tablet  Commonly known as:  MOBIC  TAKE 1 TABLET ONCE A DAY     MULTI-VITAMIN PO  Take by mouth daily.     OVER THE COUNTER MEDICATION  Take 4 capsules by mouth daily.  JR Carlson's Elite Omega Fish oil     PREMARIN vaginal cream  Generic drug:  conjugated estrogens  USE 1 GM VAGINALLY 2 TO 3 TIMES A WEEK     PROBIOTIC DAILY PO  Take by mouth.     ranitidine 150 MG tablet  Commonly known as:  ZANTAC  Take 150 mg by mouth every morning.     valsartan 320 MG tablet  Commonly known as:  DIOVAN  TAKE (1) TABLET DAILY AS DIRECTED.     VITAMIN D PO  Take 5,000 Units by mouth daily.           Objective:    BP 121/77 mmHg  Pulse 74  Temp(Src) 97.6 F (36.4 C) (Oral)  Ht 5\' 2"  (1.575 m)  Wt 142 lb 12.8 oz (64.774 kg)  BMI 26.11 kg/m2  Wt Readings from Last 3 Encounters:  01/27/16 142 lb 12.8 oz (64.774 kg)  12/12/15 140 lb 8 oz (63.73 kg)  10/13/15 140 lb (63.504 kg)    Physical Exam  Constitutional: She is oriented to person, place, and time. She appears well-developed and  well-nourished. No distress.  Eyes: Conjunctivae and EOM are normal. Pupils are equal, round, and reactive to light.  Cardiovascular: Normal rate, regular rhythm, normal heart sounds and intact distal pulses.   No murmur heard. Pulmonary/Chest: Effort normal and breath sounds normal. No respiratory distress. She has no wheezes.  Musculoskeletal: Normal range of motion. She exhibits no edema or tenderness.  Neurological: She is alert and oriented to person, place, and time. Coordination normal.  Skin: Skin is warm and dry. Abrasion (Patient has a 4 cm wide by 12 cm long abrasion on left forearm. Appears to be healing well and is mostly scabbed over. No drainage or erythema or warmth noted.) noted. No rash noted. She is not diaphoretic.  Psychiatric: She has a normal mood and affect. Her behavior is normal.  Nursing note and vitals reviewed.   Results for orders placed or performed in visit on 12/12/15  HM MAMMOGRAPHY  Result Value Ref Range   HM Mammogram 0-4 Bi-Rad 0-4 Bi-Rad, Self Reported Normal  HM MAMMOGRAPHY  Result Value Ref Range   HM Mammogram 0-4 Bi-Rad 0-4 Bi-Rad, Self Reported Normal      Assessment & Plan:   Problem List Items Addressed This Visit    None    Visit Diagnoses    Abrasion    -  Primary    Left forearm and elbow abrasion. Patient wanted to get it checked to make sure it did not appear infected and was healing well.      Keep covered when in places that may be dirty or cause infection. Otherwise keep uncovered most of the time at this point.   Follow up plan: Return if symptoms worsen or fail to improve.  Counseling provided for all of the vaccine components No orders of the defined types were placed in this encounter.    Caryl Pina, MD [facility] 01/27/2016, 6:11 PM

## 2016-01-31 ENCOUNTER — Encounter: Payer: Self-pay | Admitting: Nurse Practitioner

## 2016-01-31 ENCOUNTER — Ambulatory Visit (INDEPENDENT_AMBULATORY_CARE_PROVIDER_SITE_OTHER): Payer: Medicare Other | Admitting: Nurse Practitioner

## 2016-01-31 VITALS — BP 104/61 | HR 82 | Temp 97.1°F | Ht 62.0 in | Wt 145.4 lb

## 2016-01-31 DIAGNOSIS — S50912A Unspecified superficial injury of left forearm, initial encounter: Secondary | ICD-10-CM

## 2016-01-31 DIAGNOSIS — T148XXA Other injury of unspecified body region, initial encounter: Secondary | ICD-10-CM

## 2016-01-31 MED ORDER — SULFAMETHOXAZOLE-TRIMETHOPRIM 800-160 MG PO TABS: 1.0000 | ORAL_TABLET | Freq: Two times a day (BID) | ORAL | Status: DC

## 2016-01-31 NOTE — Patient Instructions (Signed)
Abrasion An abrasion is a cut or scrape on the outer surface of your skin. An abrasion does not extend through all of the layers of your skin. It is important to care for your abrasion properly to prevent infection. CAUSES Most abrasions are caused by falling on or gliding across the ground or another surface. When your skin rubs on something, the outer and inner layer of skin rubs off.  SYMPTOMS A cut or scrape is the main symptom of this condition. The scrape may be bleeding, or it may appear red or pink. If there was an associated fall, there may be an underlying bruise. DIAGNOSIS An abrasion is diagnosed with a physical exam. TREATMENT Treatment for this condition depends on how large and deep the abrasion is. Usually, your abrasion will be cleaned with water and mild soap. This removes any dirt or debris that may be stuck. An antibiotic ointment may be applied to the abrasion to help prevent infection. A bandage (dressing) may be placed on the abrasion to keep it clean. You may also need a tetanus shot. HOME CARE INSTRUCTIONS Medicines  Take or apply medicines only as directed by your health care provider.  If you were prescribed an antibiotic ointment, finish all of it even if you start to feel better. Wound Care  Clean the wound with mild soap and water 2-3 times per day or as directed by your health care provider. Pat your wound dry with a clean towel. Do not rub it.  There are many different ways to close and cover a wound. Follow instructions from your health care provider about:  Wound care.  Dressing changes and removal.  Check your wound every day for signs of infection. Watch for:  Redness, swelling, or pain.  Fluid, blood, or pus. General Instructions  Keep the dressing dry as directed by your health care provider. Do not take baths, swim, use a hot tub, or do anything that would put your wound underwater until your health care provider approves.  If there is  swelling, raise (elevate) the injured area above the level of your heart while you are sitting or lying down.  Keep all follow-up visits as directed by your health care provider. This is important. SEEK MEDICAL CARE IF:  You received a tetanus shot and you have swelling, severe pain, redness, or bleeding at the injection site.  Your pain is not controlled with medicine.  You have increased redness, swelling, or pain at the site of your wound. SEEK IMMEDIATE MEDICAL CARE IF:  You have a red streak going away from your wound.  You have a fever.  You have fluid, blood, or pus coming from your wound.  You notice a bad smell coming from your wound or your dressing.   This information is not intended to replace advice given to you by your health care provider. Make sure you discuss any questions you have with your health care provider.   Document Released: 04/28/2005 Document Revised: 04/09/2015 Document Reviewed: 07/17/2014 Elsevier Interactive Patient Education 2016 [employer] Inc[employer]  

## 2016-01-31 NOTE — Progress Notes (Signed)
   Subjective:    Patient ID: Marissa Perez, female    DOB: 18-Jan-1946, 70 y.o.   MRN: [id number]  HPI Patient was at the beach last Sunday and fell and has superficial wounds on left farearm and elbow- SHe came in on 01/27/16 to be seen by Dr. Warrick Parisian and at that time it looked good. Now patient says that it started draining yesterday and wants to make sure their is no infection.    Review of Systems  Constitutional: Negative.   HENT: Negative.   Respiratory: Negative.   Cardiovascular: Negative.   Genitourinary: Negative.   Neurological: Negative.   Psychiatric/Behavioral: Negative.   All other systems reviewed and are negative.      Objective:   Physical Exam  Constitutional: She is oriented to person, place, and time. She appears well-developed and well-nourished.  Cardiovascular: Normal rate, regular rhythm and normal heart sounds.   Pulmonary/Chest: Effort normal and breath sounds normal.  Neurological: She is alert and oriented to person, place, and time.  Skin: Skin is warm and dry.  4cm x12cm superficial scrap to left forearm- slightly moist but no actual drainage. Surrounding echymosis.that extends up past elbow.  Psychiatric: She has a normal mood and affect. Her behavior is normal. Judgment and thought content normal.   BP 104/61 mmHg  Pulse 82  Temp(Src) 97.1 F (36.2 C) (Oral)  Ht 5\' 2"  (1.575 m)  Wt 145 lb 6.4 oz (65.953 kg)  BMI 26.59 kg/m2        Assessment & Plan:   1. Abrasion    Meds ordered this encounter  Medications  . sulfamethoxazole-trimethoprim (BACTRIM DS) 800-160 MG tablet    Sig: Take 1 tablet by mouth 2 (two) times daily.    Dispense:  14 tablet    Refill:  0    Order Specific Question:  Supervising Provider    Answer:  Chipper Herb [[id number]]   Continue wound care as discuseed RTO prn  Mary-Margaret Hassell Done, FNP

## 2016-02-04 ENCOUNTER — Other Ambulatory Visit: Payer: Self-pay | Admitting: Family Medicine

## 2016-02-25 ENCOUNTER — Ambulatory Visit (INDEPENDENT_AMBULATORY_CARE_PROVIDER_SITE_OTHER): Payer: Medicare Other | Admitting: Family Medicine

## 2016-02-25 ENCOUNTER — Encounter: Payer: Self-pay | Admitting: Family Medicine

## 2016-02-25 VITALS — BP 110/72 | HR 75 | Temp 97.4°F | Ht 62.0 in | Wt 143.0 lb

## 2016-02-25 DIAGNOSIS — I10 Essential (primary) hypertension: Secondary | ICD-10-CM

## 2016-02-25 DIAGNOSIS — K219 Gastro-esophageal reflux disease without esophagitis: Secondary | ICD-10-CM

## 2016-02-25 DIAGNOSIS — E039 Hypothyroidism, unspecified: Secondary | ICD-10-CM | POA: Diagnosis not present

## 2016-02-25 DIAGNOSIS — E559 Vitamin D deficiency, unspecified: Secondary | ICD-10-CM | POA: Diagnosis not present

## 2016-02-25 DIAGNOSIS — E785 Hyperlipidemia, unspecified: Secondary | ICD-10-CM | POA: Diagnosis not present

## 2016-02-25 NOTE — Progress Notes (Signed)
Subjective:    Patient ID: Marissa Perez, female    DOB: 1945/09/07, 70 y.o.   MRN: [id number]  HPI Pt here for follow up and management of chronic medical problems which includes hypertension, hypothyroid, and hyperlipidemia. She is taking medications . The patient has had 6 pounds of unexpected weight gain. She also complains of some reflux symptoms. She is currently taking ranitidine. The patient denies any chest pain chest pressure tightness or shortness of breath. She has some reflux as mentioned but otherwise no trouble swallowing nausea vomiting diarrhea or blood in the stool. The reflux is most noted by the sensation of burning in her throat. She is passing her water without problems. She is up-to-date on her health maintenance issues.     Patient Active Problem List   Diagnosis Date Noted  . Hypothyroidism 12/11/2012  . Hyperlipidemia 12/11/2012  . Hypertension 12/11/2012  . GERD (gastroesophageal reflux disease) 12/11/2012   Outpatient Encounter Prescriptions as of 02/25/2016  Medication Sig  . aspirin (ASPIRIN LOW DOSE) 81 MG EC tablet Take 81 mg by mouth daily.    [name] Kitchen atorvastatin (LIPITOR) 20 MG tablet Take 1 tablet (20 mg total) by mouth daily.  [name] Kitchen BIOTIN PO Take by mouth.    . Calcium Citrate-Vitamin D (CALCIUM CITRATE + D3 MAXIMUM) 315-250 MG-UNIT TABS Take 1 tablet by mouth 2 (two) times daily.  . Cholecalciferol (VITAMIN D PO) Take 5,000 Units by mouth daily.  . clobetasol (TEMOVATE) 0.05 % external solution   . Cyanocobalamin (B-12) 1000 MCG SUBL Place under the tongue.  . ezetimibe (ZETIA) 10 MG tablet Take 1 tablet (10 mg total) by mouth daily.  [name] Kitchen gabapentin (NEURONTIN) 100 MG capsule Take 1 OR 2 caps at bedtime  . hydrochlorothiazide (MICROZIDE) 12.5 MG capsule Take 1 capsule (12.5 mg total) by mouth daily.  [name] Kitchen levothyroxine (SYNTHROID, LEVOTHROID) 50 MCG tablet TAKE 1/2 TAB ON MON, WED, FRI & 1 TAB ALL OTHER DAYS  . Magnesium 400 MG TABS Take 400 mg by mouth daily.    . meloxicam (MOBIC) 15 MG tablet TAKE 1 TABLET ONCE A DAY  . Multiple Vitamin (MULTI-VITAMIN PO) Take by mouth daily.    [name] Kitchen OVER THE COUNTER MEDICATION Take 4 capsules by mouth daily. JR Carlson's Elite Omega Fish oil  . PREMARIN vaginal cream USE 1 GM VAGINALLY 2 TO 3 TIMES A WEEK  . Probiotic Product (PROBIOTIC DAILY PO) Take by mouth.  . ranitidine (ZANTAC) 150 MG tablet Take 150 mg by mouth every morning.  . valsartan (DIOVAN) 320 MG tablet TAKE (1) TABLET DAILY AS DIRECTED.  . [DISCONTINUED] sulfamethoxazole-trimethoprim (BACTRIM DS) 800-160 MG tablet Take 1 tablet by mouth 2 (two) times daily.   No facility-administered encounter medications on file as of 02/25/2016.       Review of Systems  Constitutional: Positive for unexpected weight change (increased ).  HENT: Negative.   Eyes: Negative.   Respiratory: Negative.   Cardiovascular: Negative.   Gastrointestinal: Negative.        Reflux   Endocrine: Negative.   Genitourinary: Negative.   Musculoskeletal: Negative.   Skin: Negative.   Allergic/Immunologic: Negative.   Neurological: Negative.   Hematological: Negative.   Psychiatric/Behavioral: Negative.        Objective:   Physical Exam  Constitutional: She is oriented to person, place, and time. She appears well-developed and well-nourished. No distress.  HENT:  Right Ear: External ear normal.  Left Ear: External ear normal.  Nose: Nose normal.  Mouth/Throat: Oropharynx  is clear and moist. No oropharyngeal exudate.  Eyes: Conjunctivae and EOM are normal. Pupils are equal, round, and reactive to light. Right eye exhibits no discharge. Left eye exhibits no discharge. No scleral icterus.  Neck: Normal range of motion. Neck supple. No thyromegaly present.  No bruits or thyromegaly  Cardiovascular: Normal rate, regular rhythm, normal heart sounds and intact distal pulses.   No murmur heard. The rhythm is regular at 72/m  Pulmonary/Chest: Effort normal and breath sounds  normal. No respiratory distress. She has no wheezes. She has no rales.  Clear anteriorly and posteriorly  Abdominal: Soft. Bowel sounds are normal. She exhibits no mass. There is no tenderness. There is no rebound and no guarding.  No abdominal tenderness masses or bruits  Musculoskeletal: Normal range of motion. She exhibits no edema.  Good mobility of both lower extremities and good flexion and extension of right leg without knee pain.  Lymphadenopathy:    She has no cervical adenopathy.  Neurological: She is alert and oriented to person, place, and time. She has normal reflexes. No cranial nerve deficit.  Skin: Skin is warm and dry. No rash noted.  Psychiatric: She has a normal mood and affect. Her behavior is normal. Judgment and thought content normal.  Nursing note and vitals reviewed.  BP 110/72 (BP Location: Right Arm)   Pulse 75   Temp 97.4 F (36.3 C) (Oral)   Ht _0  (1.575 m)   Wt 143 lb (64.9 kg)   BMI 26.16 kg/m         Assessment & Plan:  1. Essential hypertension -The blood pressure good today she will continue with current treatment - BMP8+EGFR - CBC with Differential/Platelet - Hepatic function panel  2. Hypothyroidism, unspecified hypothyroidism type -Continue with thyroid treatment pending results of lab work - CBC with Differential/Platelet - Thyroid Panel With TSH  3. Hyperlipidemia -Continue with aggressive therapeutic lifestyle changes and current treatment pending results of lab work - CBC with Differential/Platelet - NMR, lipoprofile  4. Vitamin D deficiency -Continue with vitamin D replacement pending results of lab work - CBC with Differential/Platelet - VITAMIN D 25 Hydroxy (Vit-D Deficiency, Fractures)  5. Gastroesophageal reflux disease, esophagitis presence not specified -Increase ranitidine as directed - CBC with Differential/Platelet   Patient Instructions                       Medicare Annual Wellness Visit  [facility] and  the medical providers at [facility] strive to bring you the best medical care.  In doing so we not only want to address your current medical conditions and concerns but also to detect new conditions early and prevent illness, disease and health-related problems.    Medicare offers a yearly Wellness Visit which allows our clinical staff to assess your need for preventative services including immunizations, lifestyle education, counseling to decrease risk of preventable diseases and screening for fall risk and other medical concerns.    This visit is provided free of charge (no copay) for all Medicare recipients. The clinical pharmacists at [facility] have begun to conduct these Wellness Visits which will also include a thorough review of all your medications.    As you primary medical provider recommend that you make an appointment for your Annual Wellness Visit if you have not done so already this year.  You may set up this appointment before you leave today or you may call back [phone]) and schedule an appointment.  Please make sure when you call that you mention that you are scheduling your Annual Wellness Visit with the clinical pharmacist so that the appointment may be made for the proper length of time.    Continue current medications. Continue good therapeutic lifestyle changes which include good diet and exercise. Fall precautions discussed with patient. If an FOBT was given today- please return it to our front desk. If you are over 36 years old - you may need Prevnar 81 or the adult Pneumonia vaccine.  **Flu shots are available--- please call and schedule a FLU-CLINIC appointment**  After your visit with [place] today you will receive a survey in the mail or online from [employer] regarding your care with [place]. Please take a moment to fill this out. Your feedback is very important to [place] as you can help [place] better understand your patient needs as well  as improve your experience and satisfaction. WE CARE ABOUT YOU!!!   Walk and exercise as directed by the orthopedist Drink more water Continue to be careful do not put yourself at risk for falling Increase the ranitidine 250 mg twice daily before breakfast and supper. If this does not control her reflux increase it to 300 mg in the morning before breakfast on 150 before supper. He should do the increased regimen for at least 4 weeks before reducing it again Try to take less meloxicam if possible Work with diet and exercise to lose weight    Arrie Senate MD

## 2016-02-25 NOTE — Patient Instructions (Addendum)
Medicare Annual Wellness Visit  [facility] and the medical providers at [facility] strive to bring you the best medical care.  In doing so we not only want to address your current medical conditions and concerns but also to detect new conditions early and prevent illness, disease and health-related problems.    Medicare offers a yearly Wellness Visit which allows our clinical staff to assess your need for preventative services including immunizations, lifestyle education, counseling to decrease risk of preventable diseases and screening for fall risk and other medical concerns.    This visit is provided free of charge (no copay) for all Medicare recipients. The clinical pharmacists at [facility] have begun to conduct these Wellness Visits which will also include a thorough review of all your medications.    As you primary medical provider recommend that you make an appointment for your Annual Wellness Visit if you have not done so already this year.  You may set up this appointment before you leave today or you may call back [phone] and schedule an appointment.  Please make sure when you call that you mention that you are scheduling your Annual Wellness Visit with the clinical pharmacist so that the appointment may be made for the proper length of time.    Continue current medications. Continue good therapeutic lifestyle changes which include good diet and exercise. Fall precautions discussed with patient. If an FOBT was given today- please return it to our front desk. If you are over 38 years old - you may need Prevnar 70 or the adult Pneumonia vaccine.  **Flu shots are available--- please call and schedule a FLU-CLINIC appointment**  After your visit with [place] today you will receive a survey in the mail or online from [employer] regarding your care with [place]. Please take a moment to fill this out. Your feedback is very  important to [place] as you can help [place] better understand your patient needs as well as improve your experience and satisfaction. WE CARE ABOUT YOU!!!   Walk and exercise as directed by the orthopedist Drink more water Continue to be careful do not put yourself at risk for falling Increase the ranitidine 250 mg twice daily before breakfast and supper. If this does not control her reflux increase it to 300 mg in the morning before breakfast on 150 before supper. He should do the increased regimen for at least 4 weeks before reducing it again Try to take less meloxicam if possible Work with diet and exercise to lose weight

## 2016-02-26 ENCOUNTER — Other Ambulatory Visit: Payer: Self-pay | Admitting: *Deleted

## 2016-02-26 DIAGNOSIS — R945 Abnormal results of liver function studies: Principal | ICD-10-CM

## 2016-02-26 DIAGNOSIS — R7989 Other specified abnormal findings of blood chemistry: Secondary | ICD-10-CM

## 2016-02-26 LAB — VITAMIN D 25 HYDROXY (VIT D DEFICIENCY, FRACTURES): Vit D, 25-Hydroxy: 57.4 ng/mL (ref 30.0–100.0)

## 2016-02-26 LAB — CBC WITH DIFFERENTIAL/PLATELET
BASOS: 1 %
Basophils Absolute: 0 10*3/uL (ref 0.0–0.2)
EOS (ABSOLUTE): 0.1 10*3/uL (ref 0.0–0.4)
EOS: 2 %
HEMATOCRIT: 37.1 % (ref 34.0–46.6)
Hemoglobin: 12.3 g/dL (ref 11.1–15.9)
Immature Grans (Abs): 0 10*3/uL (ref 0.0–0.1)
Immature Granulocytes: 0 %
LYMPHS ABS: 1.4 10*3/uL (ref 0.7–3.1)
Lymphs: 39 %
MCH: 31.3 pg (ref 26.6–33.0)
MCHC: 33.2 g/dL (ref 31.5–35.7)
MCV: 94 fL (ref 79–97)
MONOCYTES: 7 %
Monocytes Absolute: 0.3 10*3/uL (ref 0.1–0.9)
NEUTROS ABS: 1.9 10*3/uL (ref 1.4–7.0)
Neutrophils: 51 %
Platelets: 342 10*3/uL (ref 150–379)
RBC: 3.93 x10E6/uL (ref 3.77–5.28)
RDW: 13.3 % (ref 12.3–15.4)
WBC: 3.6 10*3/uL (ref 3.4–10.8)

## 2016-02-26 LAB — THYROID PANEL WITH TSH
FREE THYROXINE INDEX: 1.9 (ref 1.2–4.9)
T3 UPTAKE RATIO: 31 % (ref 24–39)
T4, Total: 6.1 ug/dL (ref 4.5–12.0)
TSH: 2.35 u[IU]/mL (ref 0.450–4.500)

## 2016-02-26 LAB — BMP8+EGFR
BUN / CREAT RATIO: 14 (ref 12–28)
BUN: 10 mg/dL (ref 8–27)
CO2: 25 mmol/L (ref 18–29)
CREATININE: 0.74 mg/dL (ref 0.57–1.00)
Calcium: 10.1 mg/dL (ref 8.7–10.3)
Chloride: 94 mmol/L — ABNORMAL LOW (ref 96–106)
GFR, EST AFRICAN AMERICAN: 95 mL/min/{1.73_m2} (ref >59)
GFR, EST NON AFRICAN AMERICAN: 82 mL/min/{1.73_m2} (ref >59)
Glucose: 93 mg/dL (ref 65–99)
Potassium: 5 mmol/L (ref 3.5–5.2)
SODIUM: 137 mmol/L (ref 134–144)

## 2016-02-26 LAB — NMR, LIPOPROFILE
CHOLESTEROL: 140 mg/dL (ref 100–199)
HDL Cholesterol by NMR: 71 mg/dL (ref >39)
HDL PARTICLE NUMBER: 46.2 umol/L (ref >=30.5)
LDL Particle Number: 828 nmol/L (ref <1000)
LDL SIZE: 20.3 nm (ref >20.5)
LDL-C: 45 mg/dL (ref 0–99)
LP-IR SCORE: 54 — AB (ref <=45)
SMALL LDL PARTICLE NUMBER: 597 nmol/L — AB (ref <=527)
TRIGLYCERIDES BY NMR: 122 mg/dL (ref 0–149)

## 2016-02-26 LAB — HEPATIC FUNCTION PANEL
ALBUMIN: 4.9 g/dL — AB (ref 3.5–4.8)
ALT: 35 IU/L — ABNORMAL HIGH (ref 0–32)
AST: 32 IU/L (ref 0–40)
Alkaline Phosphatase: 51 IU/L (ref 39–117)
BILIRUBIN TOTAL: 0.4 mg/dL (ref 0.0–1.2)
BILIRUBIN, DIRECT: 0.12 mg/dL (ref 0.00–0.40)
Total Protein: 7 g/dL (ref 6.0–8.5)

## 2016-03-23 ENCOUNTER — Other Ambulatory Visit: Payer: Medicare Other

## 2016-03-23 DIAGNOSIS — R7989 Other specified abnormal findings of blood chemistry: Secondary | ICD-10-CM

## 2016-03-23 DIAGNOSIS — R945 Abnormal results of liver function studies: Principal | ICD-10-CM

## 2016-03-24 LAB — HEPATIC FUNCTION PANEL
ALBUMIN: 5.1 g/dL — AB (ref 3.5–4.8)
ALT: 32 IU/L (ref 0–32)
AST: 27 IU/L (ref 0–40)
Alkaline Phosphatase: 57 IU/L (ref 39–117)
BILIRUBIN TOTAL: 0.3 mg/dL (ref 0.0–1.2)
Bilirubin, Direct: 0.11 mg/dL (ref 0.00–0.40)
TOTAL PROTEIN: 7.3 g/dL (ref 6.0–8.5)

## 2016-03-29 ENCOUNTER — Other Ambulatory Visit: Payer: Self-pay | Admitting: Family Medicine

## 2016-05-06 ENCOUNTER — Ambulatory Visit (INDEPENDENT_AMBULATORY_CARE_PROVIDER_SITE_OTHER): Payer: Medicare Other

## 2016-05-06 DIAGNOSIS — Z23 Encounter for immunization: Secondary | ICD-10-CM

## 2016-06-21 ENCOUNTER — Other Ambulatory Visit: Payer: Self-pay | Admitting: Family Medicine

## 2016-06-29 ENCOUNTER — Ambulatory Visit (INDEPENDENT_AMBULATORY_CARE_PROVIDER_SITE_OTHER): Payer: Medicare Other

## 2016-06-29 ENCOUNTER — Ambulatory Visit (INDEPENDENT_AMBULATORY_CARE_PROVIDER_SITE_OTHER): Payer: Medicare Other | Admitting: Nurse Practitioner

## 2016-06-29 ENCOUNTER — Encounter: Payer: Self-pay | Admitting: Nurse Practitioner

## 2016-06-29 VITALS — BP 122/76 | HR 76 | Temp 96.9°F | Ht 62.0 in | Wt 146.0 lb

## 2016-06-29 DIAGNOSIS — S90121A Contusion of right lesser toe(s) without damage to nail, initial encounter: Secondary | ICD-10-CM

## 2016-06-29 DIAGNOSIS — M79674 Pain in right toe(s): Secondary | ICD-10-CM | POA: Diagnosis not present

## 2016-06-29 NOTE — Progress Notes (Signed)
   Subjective:    Patient ID: Marissa Perez, female    DOB: 24-Jan-1946, 70 y.o.   MRN: [id number]  HPI Patient is in today c/o pain right second toe= she says she was walking in dark at hotel room and hit her right second toe on something. Has been painful since- she has been taking advil and putting ice on it.    Review of Systems  Respiratory: Negative.   Cardiovascular: Negative.   Musculoskeletal: Negative for back pain, gait problem, joint swelling, myalgias and neck pain.  Neurological: Negative.   Psychiatric/Behavioral: Negative.   All other systems reviewed and are negative.      Objective:   Physical Exam  Constitutional: She is oriented to person, place, and time. She appears well-developed and well-nourished. No distress.  Cardiovascular: Normal rate and regular rhythm.   Pulmonary/Chest: Effort normal and breath sounds normal.  Musculoskeletal:  Right second toe echymosis and edema with pain on flexion and extension.  Neurological: She is alert and oriented to person, place, and time.  Skin: Skin is warm.  Psychiatric: She has a normal mood and affect. Her behavior is normal. Judgment and thought content normal.   BP 122/76   Pulse 76   Temp (!) 96.9 F (36.1 C) (Oral)   Ht 5\' 2"  (1.575 m)   Wt 146 lb (66.2 kg)   BMI 26.70 kg/m   Right 2nd toe x ray- no obvious fracture noted-Preliminary reading by Ronnald Collum, FNP  [facility]      Assessment & Plan:   1. Pain of toe of right foot   2. Contusion of lesser toe of right foot without damage to nail, initial encounter    Continue to ice bid Can try taping it to big toe Limit weight bearing RTO prn  Mary-Margaret Hassell Done, FNP

## 2016-06-30 ENCOUNTER — Other Ambulatory Visit: Payer: Medicare Other

## 2016-06-30 DIAGNOSIS — E7800 Pure hypercholesterolemia, unspecified: Secondary | ICD-10-CM

## 2016-06-30 DIAGNOSIS — K219 Gastro-esophageal reflux disease without esophagitis: Secondary | ICD-10-CM

## 2016-06-30 DIAGNOSIS — E559 Vitamin D deficiency, unspecified: Secondary | ICD-10-CM

## 2016-06-30 DIAGNOSIS — I10 Essential (primary) hypertension: Secondary | ICD-10-CM

## 2016-07-01 LAB — CBC WITH DIFFERENTIAL/PLATELET
BASOS ABS: 0 10*3/uL (ref 0.0–0.2)
BASOS: 1 %
EOS (ABSOLUTE): 0.1 10*3/uL (ref 0.0–0.4)
Eos: 2 %
Hematocrit: 38.8 % (ref 34.0–46.6)
Hemoglobin: 12.6 g/dL (ref 11.1–15.9)
IMMATURE GRANS (ABS): 0 10*3/uL (ref 0.0–0.1)
IMMATURE GRANULOCYTES: 0 %
LYMPHS: 37 %
Lymphocytes Absolute: 1.8 10*3/uL (ref 0.7–3.1)
MCH: 30.9 pg (ref 26.6–33.0)
MCHC: 32.5 g/dL (ref 31.5–35.7)
MCV: 95 fL (ref 79–97)
MONOCYTES: 9 %
Monocytes Absolute: 0.4 10*3/uL (ref 0.1–0.9)
NEUTROS PCT: 51 %
Neutrophils Absolute: 2.4 10*3/uL (ref 1.4–7.0)
PLATELETS: 331 10*3/uL (ref 150–379)
RBC: 4.08 x10E6/uL (ref 3.77–5.28)
RDW: 13.4 % (ref 12.3–15.4)
WBC: 4.8 10*3/uL (ref 3.4–10.8)

## 2016-07-01 LAB — HEPATIC FUNCTION PANEL
ALBUMIN: 4.9 g/dL — AB (ref 3.5–4.8)
ALT: 46 IU/L — ABNORMAL HIGH (ref 0–32)
AST: 36 IU/L (ref 0–40)
Alkaline Phosphatase: 55 IU/L (ref 39–117)
BILIRUBIN TOTAL: 0.3 mg/dL (ref 0.0–1.2)
BILIRUBIN, DIRECT: 0.11 mg/dL (ref 0.00–0.40)
TOTAL PROTEIN: 7 g/dL (ref 6.0–8.5)

## 2016-07-01 LAB — NMR, LIPOPROFILE
Cholesterol: 149 mg/dL (ref 100–199)
HDL CHOLESTEROL BY NMR: 71 mg/dL (ref >39)
HDL PARTICLE NUMBER: 49.9 umol/L (ref >=30.5)
LDL Particle Number: 855 nmol/L (ref <1000)
LDL Size: 20.5 nm (ref >20.5)
LDL-C: 58 mg/dL (ref 0–99)
LP-IR Score: 60 — ABNORMAL HIGH (ref <=45)
SMALL LDL PARTICLE NUMBER: 381 nmol/L (ref <=527)
TRIGLYCERIDES BY NMR: 98 mg/dL (ref 0–149)

## 2016-07-01 LAB — BMP8+EGFR
BUN/Creatinine Ratio: 15 (ref 12–28)
BUN: 12 mg/dL (ref 8–27)
CALCIUM: 9.9 mg/dL (ref 8.7–10.3)
CHLORIDE: 94 mmol/L — AB (ref 96–106)
CO2: 26 mmol/L (ref 18–29)
Creatinine, Ser: 0.79 mg/dL (ref 0.57–1.00)
GFR calc Af Amer: 88 mL/min/{1.73_m2} (ref >59)
GFR calc non Af Amer: 76 mL/min/{1.73_m2} (ref >59)
GLUCOSE: 92 mg/dL (ref 65–99)
Potassium: 5.3 mmol/L — ABNORMAL HIGH (ref 3.5–5.2)
Sodium: 136 mmol/L (ref 134–144)

## 2016-07-01 LAB — VITAMIN D 25 HYDROXY (VIT D DEFICIENCY, FRACTURES): Vit D, 25-Hydroxy: 54.5 ng/mL (ref 30.0–100.0)

## 2016-07-03 ENCOUNTER — Other Ambulatory Visit: Payer: Self-pay | Admitting: Family Medicine

## 2016-07-05 ENCOUNTER — Ambulatory Visit (INDEPENDENT_AMBULATORY_CARE_PROVIDER_SITE_OTHER): Payer: Medicare Other | Admitting: Family Medicine

## 2016-07-05 ENCOUNTER — Encounter: Payer: Self-pay | Admitting: Family Medicine

## 2016-07-05 VITALS — BP 121/71 | HR 72 | Temp 97.4°F | Ht 62.0 in | Wt 144.0 lb

## 2016-07-05 DIAGNOSIS — M25561 Pain in right knee: Secondary | ICD-10-CM | POA: Diagnosis not present

## 2016-07-05 DIAGNOSIS — Z803 Family history of malignant neoplasm of breast: Secondary | ICD-10-CM | POA: Insufficient documentation

## 2016-07-05 DIAGNOSIS — K219 Gastro-esophageal reflux disease without esophagitis: Secondary | ICD-10-CM | POA: Diagnosis not present

## 2016-07-05 DIAGNOSIS — R29898 Other symptoms and signs involving the musculoskeletal system: Secondary | ICD-10-CM | POA: Diagnosis not present

## 2016-07-05 DIAGNOSIS — C4490 Unspecified malignant neoplasm of skin, unspecified: Secondary | ICD-10-CM | POA: Diagnosis not present

## 2016-07-05 DIAGNOSIS — M79674 Pain in right toe(s): Secondary | ICD-10-CM

## 2016-07-05 DIAGNOSIS — E875 Hyperkalemia: Secondary | ICD-10-CM | POA: Diagnosis not present

## 2016-07-05 DIAGNOSIS — E559 Vitamin D deficiency, unspecified: Secondary | ICD-10-CM | POA: Diagnosis not present

## 2016-07-05 DIAGNOSIS — I10 Essential (primary) hypertension: Secondary | ICD-10-CM

## 2016-07-05 DIAGNOSIS — E7800 Pure hypercholesterolemia, unspecified: Secondary | ICD-10-CM

## 2016-07-05 MED ORDER — MELOXICAM 15 MG PO TABS: 15.0000 mg | ORAL_TABLET | Freq: Every day | ORAL | 3 refills | Status: DC

## 2016-07-05 MED ORDER — ESTROGENS, CONJUGATED 0.625 MG/GM VA CREA: TOPICAL_CREAM | VAGINAL | 11 refills | Status: DC

## 2016-07-05 NOTE — Progress Notes (Signed)
Subjective:    Patient ID: Marissa Perez, female    DOB: 1946-06-28, 70 y.o.   MRN: [id number]  HPI Pt here for follow up and management of chronic medical problems which includes hyperlipidemia and hyperlipidemia. She is taking medications regularly.The patient today complains of some right lower leg soreness. She also wants the second toe on her right foot check. She is requesting refills on her Premarin and her meloxicam. She has had lab work and this will be reviewed with her during the visit today. On the recent lab work that will be reviewed with her the potassium was elevated and this will be repeated during the visit today. All of her cholesterol numbers were good. Her CBC was good. A couple of liver function tests were elevated that have been elevated in the past. The creatinine was good. The patient denies any chest pain pressure palpitations or shortness of breath. She denies any trouble other than her usual with heartburn indigestion nausea vomiting diarrhea or blood in the stool. She is up-to-date on her colonoscopies. She takes a little anti-inflammatory medicine as possible and does not take it regularly. Passing her water without problems. She is up-to-date on her eye exams.    Patient Active Problem List   Diagnosis Date Noted  . Hypothyroidism 12/11/2012  . Hyperlipidemia 12/11/2012  . Hypertension 12/11/2012  . GERD (gastroesophageal reflux disease) 12/11/2012   Outpatient Encounter Prescriptions as of 07/05/2016  Medication Sig  . aspirin (ASPIRIN LOW DOSE) 81 MG EC tablet Take 81 mg by mouth daily.    [name] Kitchen atorvastatin (LIPITOR) 20 MG tablet Take 1 tablet (20 mg total) by mouth daily.  [name] Kitchen BIOTIN PO Take by mouth.    . Calcium Citrate-Vitamin D (CALCIUM CITRATE + D3 MAXIMUM) 315-250 MG-UNIT TABS Take 1 tablet by mouth 2 (two) times daily.  . Cholecalciferol (VITAMIN D PO) Take 5,000 Units by mouth daily.  . clobetasol (TEMOVATE) 0.05 % external solution   . Cyanocobalamin  (B-12) 1000 MCG SUBL Place under the tongue.  . ezetimibe (ZETIA) 10 MG tablet Take 1 tablet (10 mg total) by mouth daily.  [name] Kitchen gabapentin (NEURONTIN) 100 MG capsule Take 1 OR 2 caps at bedtime  . hydrochlorothiazide (MICROZIDE) 12.5 MG capsule Take 1 capsule (12.5 mg total) by mouth daily.  [name] Kitchen levothyroxine (SYNTHROID, LEVOTHROID) 50 MCG tablet TAKE 1/2 TAB ON MON, WED, FRI & 1 TAB ALL OTHER DAYS  . Multiple Vitamin (MULTI-VITAMIN PO) Take by mouth daily.    [name] Kitchen OVER THE COUNTER MEDICATION Take 4 capsules by mouth daily. JR Carlson's Elite Omega Fish oil  . PREMARIN vaginal cream USE 1 GM VAGINALLY 2 TO 3 TIMES A WEEK  . Probiotic Product (PROBIOTIC DAILY PO) Take by mouth.  . ranitidine (ZANTAC) 150 MG tablet Take 150 mg by mouth every morning.  . valsartan (DIOVAN) 320 MG tablet TAKE (1) TABLET DAILY AS DIRECTED.  . [DISCONTINUED] Magnesium 400 MG TABS Take 400 mg by mouth daily.  . meloxicam (MOBIC) 15 MG tablet TAKE 1 TABLET ONCE A DAY (Patient not taking: Reported on 07/05/2016)   No facility-administered encounter medications on file as of 07/05/2016.       Review of Systems  Constitutional: Negative.   HENT: Negative.   Eyes: Negative.   Respiratory: Negative.   Cardiovascular: Negative.   Gastrointestinal: Negative.   Endocrine: Negative.   Genitourinary: Negative.   Musculoskeletal: Positive for myalgias (right lower leg and knee soreness).       Check 2nd  toe right foot   Skin: Negative.   Allergic/Immunologic: Negative.   Neurological: Negative.   Hematological: Negative.   Psychiatric/Behavioral: Negative.        Objective:   Physical Exam  Constitutional: She is oriented to person, place, and time. She appears well-developed and well-nourished. No distress.  Pleasant and alert  HENT:  Head: Normocephalic and atraumatic.  Right Ear: External ear normal.  Left Ear: External ear normal.  Mouth/Throat: Oropharynx is clear and moist.  Ears cerumen and slight nasal  congestion  Eyes: Conjunctivae and EOM are normal. Pupils are equal, round, and reactive to light. Right eye exhibits no discharge. Left eye exhibits no discharge. No scleral icterus.  Neck: Normal range of motion. Neck supple. No thyromegaly present.  No bruits thyromegaly or anterior cervical adenopathy  Cardiovascular: Normal rate, regular rhythm, normal heart sounds and intact distal pulses.   No murmur heard. The heart has a regular rate and rhythm at 72/m  Pulmonary/Chest: Effort normal and breath sounds normal. No respiratory distress. She has no wheezes. She has no rales. She exhibits no tenderness.  Clear anteriorly and posteriorly  Abdominal: Soft. Bowel sounds are normal. She exhibits no mass. There is no tenderness. There is no rebound and no guarding.  No epigastric tenderness or organ enlargement bruits or inguinal adenopathy. No suprapubic tenderness.  Musculoskeletal: Normal range of motion. She exhibits no edema.  There is bruising of the second toe of the right foot. This is tender to palpation. The patient will buddy tape the second and third toes together for a couple weeks. There is discomfort of the movement of the right knee. Minimal fullness.  Lymphadenopathy:    She has no cervical adenopathy.  Neurological: She is alert and oriented to person, place, and time. She has normal reflexes. No cranial nerve deficit.  Skin: Skin is warm and dry. No rash noted.  Psychiatric: She has a normal mood and affect. Her behavior is normal. Judgment and thought content normal.  Nursing note and vitals reviewed.  BP 121/71 (BP Location: Left Arm)   Pulse 72   Temp 97.4 F (36.3 C) (Oral)   Ht _0  (1.575 m)   Wt 144 lb (65.3 kg)   BMI 26.34 kg/m         Assessment & Plan:  1. Pure hypercholesterolemia -All cholesterol numbers were greatly patient will continue with current treatment and with as aggressive therapeutic lifestyle changes as possible  2. Essential  hypertension -The blood pressure is good and she will continue with current treatment  3. Vitamin D deficiency -Continue with vitamin D replacement  4. Gastroesophageal reflux disease, esophagitis presence not specified -Continue to avoid NSAIDs as much as possible and watch diet as closely as possible  5. Skin cancer -Follow-up with dermatology as needed  6. Family history of breast cancer -Continue to get mammograms regularly.  7. Right knee pain, unspecified chronicity - MR Knee Right Wo Contrast; Future  8. Weakness of right leg - MR Knee Right Wo Contrast; Future  9. Serum potassium elevated - BMP8+EGFR  10. Pain of toe of right foot -Buddy tape toes together  Meds ordered this encounter  Medications  . meloxicam (MOBIC) 15 MG tablet    Sig: Take 1 tablet (15 mg total) by mouth daily.    Dispense:  30 tablet    Refill:  3  . conjugated estrogens (PREMARIN) vaginal cream    Sig: USE 1 GM VAGINALLY 2 TO 3 TIMES A WEEK  Dispense:  30 g    Refill:  11   Patient Instructions                       Medicare Annual Wellness Visit  [facility] and the medical providers at [facility] strive to bring you the best medical care.  In doing so we not only want to address your current medical conditions and concerns but also to detect new conditions early and prevent illness, disease and health-related problems.    Medicare offers a yearly Wellness Visit which allows our clinical staff to assess your need for preventative services including immunizations, lifestyle education, counseling to decrease risk of preventable diseases and screening for fall risk and other medical concerns.    This visit is provided free of charge (no copay) for all Medicare recipients. The clinical pharmacists at [facility] have begun to conduct these Wellness Visits which will also include a thorough review of all your medications.    As you primary  medical provider recommend that you make an appointment for your Annual Wellness Visit if you have not done so already this year.  You may set up this appointment before you leave today or you may call back ([phone]) and schedule an appointment.  Please make sure when you call that you mention that you are scheduling your Annual Wellness Visit with the clinical pharmacist so that the appointment may be made for the proper length of time.     Continue current medications. Continue good therapeutic lifestyle changes which include good diet and exercise. Fall precautions discussed with patient. If an FOBT was given today- please return it to our front desk. If you are over 23 years old - you may need Prevnar 53 or the adult Pneumonia vaccine.  **Flu shots are available--- please call and schedule a FLU-CLINIC appointment**  After your visit with [place] today you will receive a survey in the mail or online from [employer] regarding your care with [place]. Please take a moment to fill this out. Your feedback is very important to [place] as you can help [place] better understand your patient needs as well as improve your experience and satisfaction. WE CARE ABOUT YOU!!!   Continue current treatment Because of the persistent popliteal pain and fullness we will arrange to get an MRI of the right knee Take as little anti-inflammatory medicine as possible as this can irritate the stomach and run up the blood pressure  Arrie Senate MD

## 2016-07-05 NOTE — Patient Instructions (Addendum)
Medicare Annual Wellness Visit  [facility] and the medical providers at [facility] strive to bring you the best medical care.  In doing so we not only want to address your current medical conditions and concerns but also to detect new conditions early and prevent illness, disease and health-related problems.    Medicare offers a yearly Wellness Visit which allows our clinical staff to assess your need for preventative services including immunizations, lifestyle education, counseling to decrease risk of preventable diseases and screening for fall risk and other medical concerns.    This visit is provided free of charge (no copay) for all Medicare recipients. The clinical pharmacists at [facility] have begun to conduct these Wellness Visits which will also include a thorough review of all your medications.    As you primary medical provider recommend that you make an appointment for your Annual Wellness Visit if you have not done so already this year.  You may set up this appointment before you leave today or you may call back [phone] and schedule an appointment.  Please make sure when you call that you mention that you are scheduling your Annual Wellness Visit with the clinical pharmacist so that the appointment may be made for the proper length of time.     Continue current medications. Continue good therapeutic lifestyle changes which include good diet and exercise. Fall precautions discussed with patient. If an FOBT was given today- please return it to our front desk. If you are over 80 years old - you may need Prevnar 2 or the adult Pneumonia vaccine.  **Flu shots are available--- please call and schedule a FLU-CLINIC appointment**  After your visit with [place] today you will receive a survey in the mail or online from [employer] regarding your care with [place]. Please take a moment to fill this out. Your feedback is very  important to [place] as you can help [place] better understand your patient needs as well as improve your experience and satisfaction. WE CARE ABOUT YOU!!!   Continue current treatment Because of the persistent popliteal pain and fullness we will arrange to get an MRI of the right knee Take as little anti-inflammatory medicine as possible as this can irritate the stomach and run up the blood pressure

## 2016-07-06 LAB — BMP8+EGFR
BUN / CREAT RATIO: 19 (ref 12–28)
BUN: 12 mg/dL (ref 8–27)
CHLORIDE: 95 mmol/L — AB (ref 96–106)
CO2: 28 mmol/L (ref 18–29)
Calcium: 9.6 mg/dL (ref 8.7–10.3)
Creatinine, Ser: 0.62 mg/dL (ref 0.57–1.00)
GFR calc non Af Amer: 92 mL/min/{1.73_m2} (ref >59)
GFR, EST AFRICAN AMERICAN: 106 mL/min/{1.73_m2} (ref >59)
Glucose: 84 mg/dL (ref 65–99)
POTASSIUM: 4.6 mmol/L (ref 3.5–5.2)
Sodium: 138 mmol/L (ref 134–144)

## 2016-07-07 LAB — SPECIMEN STATUS REPORT

## 2016-07-07 LAB — MAGNESIUM: Magnesium: 1.9 mg/dL (ref 1.6–2.3)

## 2016-07-08 ENCOUNTER — Encounter: Payer: Self-pay | Admitting: Family Medicine

## 2016-07-16 ENCOUNTER — Ambulatory Visit (HOSPITAL_COMMUNITY)
Admission: RE | Admit: 2016-07-16 | Discharge: 2016-07-16 | Disposition: A | Discharge status: Home / Self Care | Payer: Medicare Other | Source: Ambulatory Visit | Attending: Family Medicine | Admitting: Family Medicine

## 2016-07-16 DIAGNOSIS — M25561 Pain in right knee: Secondary | ICD-10-CM | POA: Insufficient documentation

## 2016-07-16 DIAGNOSIS — R29898 Other symptoms and signs involving the musculoskeletal system: Secondary | ICD-10-CM | POA: Insufficient documentation

## 2016-07-16 DIAGNOSIS — M1711 Unilateral primary osteoarthritis, right knee: Secondary | ICD-10-CM | POA: Insufficient documentation

## 2016-08-07 ENCOUNTER — Other Ambulatory Visit: Payer: Self-pay | Admitting: Family Medicine

## 2016-09-07 ENCOUNTER — Other Ambulatory Visit: Payer: Self-pay | Admitting: Family Medicine

## 2016-09-07 DIAGNOSIS — Z1231 Encounter for screening mammogram for malignant neoplasm of breast: Secondary | ICD-10-CM

## 2016-09-20 ENCOUNTER — Other Ambulatory Visit: Payer: Self-pay | Admitting: Family Medicine

## 2016-10-19 ENCOUNTER — Ambulatory Visit
Admission: RE | Admit: 2016-10-19 | Discharge: 2016-10-19 | Disposition: A | Discharge status: Home / Self Care | Payer: Medicare Other | Source: Ambulatory Visit | Attending: Family Medicine | Admitting: Family Medicine

## 2016-10-19 ENCOUNTER — Ambulatory Visit: Payer: Medicare Other

## 2016-10-19 DIAGNOSIS — Z1231 Encounter for screening mammogram for malignant neoplasm of breast: Secondary | ICD-10-CM

## 2016-11-04 ENCOUNTER — Other Ambulatory Visit: Payer: Self-pay | Admitting: Family Medicine

## 2016-11-25 ENCOUNTER — Other Ambulatory Visit: Payer: Medicare Other

## 2016-11-25 DIAGNOSIS — E7800 Pure hypercholesterolemia, unspecified: Secondary | ICD-10-CM

## 2016-11-25 DIAGNOSIS — I10 Essential (primary) hypertension: Secondary | ICD-10-CM

## 2016-11-26 LAB — CBC WITH DIFFERENTIAL/PLATELET
BASOS ABS: 0 10*3/uL (ref 0.0–0.2)
BASOS: 1 %
EOS (ABSOLUTE): 0.1 10*3/uL (ref 0.0–0.4)
Eos: 3 %
Hematocrit: 38.1 % (ref 34.0–46.6)
Hemoglobin: 12.7 g/dL (ref 11.1–15.9)
IMMATURE GRANS (ABS): 0 10*3/uL (ref 0.0–0.1)
Immature Granulocytes: 0 %
LYMPHS: 44 %
Lymphocytes Absolute: 1.8 10*3/uL (ref 0.7–3.1)
MCH: 31.3 pg (ref 26.6–33.0)
MCHC: 33.3 g/dL (ref 31.5–35.7)
MCV: 94 fL (ref 79–97)
MONOS ABS: 0.3 10*3/uL (ref 0.1–0.9)
Monocytes: 6 %
NEUTROS ABS: 1.9 10*3/uL (ref 1.4–7.0)
NEUTROS PCT: 46 %
PLATELETS: 286 10*3/uL (ref 150–379)
RBC: 4.06 x10E6/uL (ref 3.77–5.28)
RDW: 13.5 % (ref 12.3–15.4)
WBC: 4.1 10*3/uL (ref 3.4–10.8)

## 2016-11-26 LAB — BMP8+EGFR
BUN / CREAT RATIO: 19 (ref 12–28)
BUN: 13 mg/dL (ref 8–27)
CO2: 25 mmol/L (ref 18–29)
CREATININE: 0.68 mg/dL (ref 0.57–1.00)
Calcium: 9.8 mg/dL (ref 8.7–10.3)
Chloride: 95 mmol/L — ABNORMAL LOW (ref 96–106)
GFR, EST AFRICAN AMERICAN: 102 mL/min/{1.73_m2} (ref >59)
GFR, EST NON AFRICAN AMERICAN: 88 mL/min/{1.73_m2} (ref >59)
Glucose: 85 mg/dL (ref 65–99)
Potassium: 4.4 mmol/L (ref 3.5–5.2)
Sodium: 137 mmol/L (ref 134–144)

## 2016-11-26 LAB — NMR, LIPOPROFILE
CHOLESTEROL: 150 mg/dL (ref 100–199)
HDL CHOLESTEROL BY NMR: 67 mg/dL (ref >39)
HDL Particle Number: 46.1 umol/L (ref >=30.5)
LDL Particle Number: 885 nmol/L (ref <1000)
LDL SIZE: 20.4 nm (ref >20.5)
LDL-C: 60 mg/dL (ref 0–99)
LP-IR Score: 44 (ref <=45)
SMALL LDL PARTICLE NUMBER: 576 nmol/L — AB (ref <=527)
TRIGLYCERIDES BY NMR: 115 mg/dL (ref 0–149)

## 2016-11-26 LAB — HEPATIC FUNCTION PANEL
ALK PHOS: 57 IU/L (ref 39–117)
ALT: 27 IU/L (ref 0–32)
AST: 28 IU/L (ref 0–40)
Albumin: 4.8 g/dL (ref 3.5–4.8)
Bilirubin Total: 0.3 mg/dL (ref 0.0–1.2)
Bilirubin, Direct: 0.08 mg/dL (ref 0.00–0.40)
Total Protein: 7.4 g/dL (ref 6.0–8.5)

## 2016-12-01 ENCOUNTER — Ambulatory Visit (INDEPENDENT_AMBULATORY_CARE_PROVIDER_SITE_OTHER): Payer: Medicare Other | Admitting: Family Medicine

## 2016-12-01 ENCOUNTER — Encounter: Payer: Self-pay | Admitting: Family Medicine

## 2016-12-01 VITALS — BP 102/64 | HR 68 | Temp 97.3°F | Ht 62.0 in | Wt 143.0 lb

## 2016-12-01 DIAGNOSIS — K219 Gastro-esophageal reflux disease without esophagitis: Secondary | ICD-10-CM | POA: Diagnosis not present

## 2016-12-01 DIAGNOSIS — E7800 Pure hypercholesterolemia, unspecified: Secondary | ICD-10-CM | POA: Diagnosis not present

## 2016-12-01 DIAGNOSIS — I10 Essential (primary) hypertension: Secondary | ICD-10-CM | POA: Diagnosis not present

## 2016-12-01 DIAGNOSIS — E559 Vitamin D deficiency, unspecified: Secondary | ICD-10-CM | POA: Diagnosis not present

## 2016-12-01 DIAGNOSIS — Z8249 Family history of ischemic heart disease and other diseases of the circulatory system: Secondary | ICD-10-CM | POA: Diagnosis not present

## 2016-12-01 MED ORDER — GABAPENTIN 100 MG PO CAPS: ORAL_CAPSULE | ORAL | 3 refills | Status: DC

## 2016-12-01 NOTE — Progress Notes (Signed)
Subjective:    Patient ID: Marissa Perez, female    DOB: 1946/04/23, 71 y.o.   MRN: [id number]  HPI Pt here for follow up and management of chronic medical problems which includes hypothyroid, hyperlipidemia and hypertension. She is taking medication regularly.The patient is doing well overall has no specific complaints. We will add a hepatitis screen to her recent lab work. She is also requesting a refill on her gabapentin. She will be given an FOBT to return. Os he was in March 2015. On her recent lab work which will be reviewed with her all liver function tests were normal. Her blood sugar was good creatinine was good and electrolytes were good except the chloride was slightly decreased and this is been the case in the past. Cholesterol numbers with advanced lipid testing were excellent and she will continue with current treatment. The CBC had a normal white blood cell count a good hemoglobin at 12.7 which was stable and an adequate platelet count. The patient is doing well overall and walking regularly. She denies any chest pain shortness of breath trouble with her intestinal tract including nausea vomiting diarrhea or blood in the stool. She is passing her water without problems and may have more frequency but nighttime but no other symptoms associated with this. Her last colonoscopy was in March 2015 and she was told she would not need another one for 10 years.  Patient Active Problem List   Diagnosis Date Noted  . Family history of breast cancer 07/05/2016  . Skin cancer 07/05/2016  . Vitamin D deficiency 07/05/2016  . Hypothyroidism 12/11/2012  . Hyperlipidemia 12/11/2012  . Hypertension 12/11/2012  . GERD (gastroesophageal reflux disease) 12/11/2012   Outpatient Encounter Prescriptions as of 12/01/2016  Medication Sig  . aspirin (ASPIRIN LOW DOSE) 81 MG EC tablet Take 81 mg by mouth daily.    [name] Kitchen atorvastatin (LIPITOR) 20 MG tablet Take 1 tablet (20 mg total) by mouth daily.  [name] Kitchen BIOTIN PO  Take by mouth.    . Calcium Citrate-Vitamin D (CALCIUM CITRATE + D3 MAXIMUM) 315-250 MG-UNIT TABS Take 1 tablet by mouth 2 (two) times daily.  . Cholecalciferol (VITAMIN D PO) Take 5,000 Units by mouth daily.  . clobetasol (TEMOVATE) 0.05 % external solution   . conjugated estrogens (PREMARIN) vaginal cream USE 1 GM VAGINALLY 2 TO 3 TIMES A WEEK  . Cyanocobalamin (B-12) 1000 MCG SUBL Place under the tongue.  . ezetimibe (ZETIA) 10 MG tablet Take 1 tablet (10 mg total) by mouth daily.  [name] Kitchen gabapentin (NEURONTIN) 100 MG capsule Take 1 OR 2 caps at bedtime (Patient taking differently: Take 2 caps at bedtime)  . hydrochlorothiazide (MICROZIDE) 12.5 MG capsule TAKE (1) CAPSULE DAILY  . levothyroxine (SYNTHROID, LEVOTHROID) 50 MCG tablet TAKE 1/2 TAB ON MON, WED, FRI & 1 TAB ALL OTHER DAYS  . levothyroxine (SYNTHROID, LEVOTHROID) 50 MCG tablet TAKE 1/2 TAB ON MON, WED, FRI & 1 TAB ON TUES AND THURS  . meloxicam (MOBIC) 15 MG tablet Take 1 tablet (15 mg total) by mouth daily.  . Multiple Vitamin (MULTI-VITAMIN PO) Take by mouth daily.    [name] Kitchen OVER THE COUNTER MEDICATION Take 4 capsules by mouth daily. JR Carlson's Elite Omega Fish oil  . Probiotic Product (PROBIOTIC DAILY PO) Take by mouth.  . ranitidine (ZANTAC) 150 MG tablet Take 150 mg by mouth every morning.  . valsartan (DIOVAN) 320 MG tablet TAKE (1) TABLET DAILY AS DIRECTED.   No facility-administered encounter medications on file as  of 12/01/2016.       Review of Systems  Constitutional: Negative.   HENT: Negative.   Eyes: Negative.   Respiratory: Negative.   Cardiovascular: Negative.   Gastrointestinal: Negative.   Endocrine: Negative.   Genitourinary: Negative.   Musculoskeletal: Negative.   Skin: Negative.   Allergic/Immunologic: Negative.   Neurological: Negative.   Hematological: Negative.   Psychiatric/Behavioral: Negative.        Objective:   Physical Exam  Constitutional: She is oriented to person, place, and time. She  appears well-developed and well-nourished. No distress.  The patient is pleasant and alert  HENT:  Head: Normocephalic and atraumatic.  Right Ear: External ear normal.  Left Ear: External ear normal.  Mouth/Throat: Oropharynx is clear and moist. No oropharyngeal exudate.  Slight nasal congestion bilaterally  Eyes: Conjunctivae and EOM are normal. Pupils are equal, round, and reactive to light. Right eye exhibits no discharge. Left eye exhibits no discharge. No scleral icterus.  Neck: Normal range of motion. Neck supple. No thyromegaly present.  No bruits thyromegaly or anterior cervical adenopathy  Cardiovascular: Normal rate, regular rhythm, normal heart sounds and intact distal pulses.   No murmur heard. The heart is regular at 68/m  Pulmonary/Chest: Effort normal and breath sounds normal. No respiratory distress. She has no wheezes. She has no rales. She exhibits no tenderness.  Clear anteriorly and posteriorly  Abdominal: Soft. Bowel sounds are normal. She exhibits no mass. There is no tenderness. There is no rebound and no guarding.  No abdominal tenderness masses bruits or thyromegaly  Musculoskeletal: Normal range of motion. She exhibits no edema.  Lymphadenopathy:    She has no cervical adenopathy.  Neurological: She is alert and oriented to person, place, and time. She has normal reflexes. No cranial nerve deficit.  Skin: Skin is warm and dry. No rash noted.  Psychiatric: She has a normal mood and affect. Her behavior is normal. Judgment and thought content normal.  Nursing note and vitals reviewed.   BP 102/64 (BP Location: Left Arm)   Pulse 68   Temp 97.3 F (36.3 C) (Oral)   Ht 5\' 2"  (1.575 m)   Wt 143 lb (64.9 kg)   BMI 26.16 kg/m        Assessment & Plan:  1. Pure hypercholesterolemia -Continue with current treatment and aggressive therapeutic lifestyle changes - Ambulatory referral to Cardiology-this referral is being done even though the patient is not having  any symptoms because of increased risk factors and the family history of heart disease.  2. Essential hypertension -The blood pressure is good today and she will continue with current treatment - Ambulatory referral to Cardiology  3. Vitamin D deficiency -Continue with current treatment  4. Gastroesophageal reflux disease, esophagitis presence not specified -No complaints with reflux disease today.  5. Family history of heart disease - Ambulatory referral to Cardiology  Meds ordered this encounter  Medications  . gabapentin (NEURONTIN) 100 MG capsule    Sig: Take 2 caps at bedtime    Dispense:  180 capsule    Refill:  3   Patient Instructions                       Medicare Annual Wellness Visit  [facility] and the medical providers at [facility] strive to bring you the best medical care.  In doing so we not only want to address your current medical conditions and concerns but also to detect new conditions early  and prevent illness, disease and health-related problems.    Medicare offers a yearly Wellness Visit which allows our clinical staff to assess your need for preventative services including immunizations, lifestyle education, counseling to decrease risk of preventable diseases and screening for fall risk and other medical concerns.    This visit is provided free of charge (no copay) for all Medicare recipients. The clinical pharmacists at [facility] have begun to conduct these Wellness Visits which will also include a thorough review of all your medications.    As you primary medical provider recommend that you make an appointment for your Annual Wellness Visit if you have not done so already this year.  You may set up this appointment before you leave today or you may call back [phone]) and schedule an appointment.  Please make sure when you call that you mention that you are scheduling your Annual Wellness Visit with the  clinical pharmacist so that the appointment may be made for the proper length of time.     Continue current medications. Continue good therapeutic lifestyle changes which include good diet and exercise. Fall precautions discussed with patient. If an FOBT was given today- please return it to our front desk. If you are over 38 years old - you may need Prevnar 36 or the adult Pneumonia vaccine.  **Flu shots are available--- please call and schedule a FLU-CLINIC appointment**  After your visit with [place] today you will receive a survey in the mail or online from [employer] regarding your care with [place]. Please take a moment to fill this out. Your feedback is very important to [place] as you can help [place] better understand your patient needs as well as improve your experience and satisfaction. WE CARE ABOUT YOU!!!     Arrie Senate MD

## 2016-12-01 NOTE — Patient Instructions (Signed)
Medicare Annual Wellness Visit  [facility] and the medical providers at [facility] strive to bring you the best medical care.  In doing so we not only want to address your current medical conditions and concerns but also to detect new conditions early and prevent illness, disease and health-related problems.    Medicare offers a yearly Wellness Visit which allows our clinical staff to assess your need for preventative services including immunizations, lifestyle education, counseling to decrease risk of preventable diseases and screening for fall risk and other medical concerns.    This visit is provided free of charge (no copay) for all Medicare recipients. The clinical pharmacists at [facility] have begun to conduct these Wellness Visits which will also include a thorough review of all your medications.    As you primary medical provider recommend that you make an appointment for your Annual Wellness Visit if you have not done so already this year.  You may set up this appointment before you leave today or you may call back [phone]) and schedule an appointment.  Please make sure when you call that you mention that you are scheduling your Annual Wellness Visit with the clinical pharmacist so that the appointment may be made for the proper length of time.     Continue current medications. Continue good therapeutic lifestyle changes which include good diet and exercise. Fall precautions discussed with patient. If an FOBT was given today- please return it to our front desk. If you are over 50 years old - you may need Prevnar 13 or the adult Pneumonia vaccine.  **Flu shots are available--- please call and schedule a FLU-CLINIC appointment**  After your visit with us today you will receive a survey in the mail or online from [employer] regarding your care with us. Please take a moment to fill this out. Your feedback is very  important to us as you can help us better understand your patient needs as well as improve your experience and satisfaction. WE CARE ABOUT YOU!!!    

## 2016-12-02 LAB — SPECIMEN STATUS REPORT

## 2016-12-02 LAB — HEPATITIS C ANTIBODY

## 2016-12-03 ENCOUNTER — Other Ambulatory Visit: Payer: Medicare Other

## 2016-12-03 DIAGNOSIS — Z1211 Encounter for screening for malignant neoplasm of colon: Secondary | ICD-10-CM

## 2016-12-06 LAB — FECAL OCCULT BLOOD, IMMUNOCHEMICAL: FECAL OCCULT BLD: NEGATIVE

## 2016-12-29 ENCOUNTER — Other Ambulatory Visit: Payer: Self-pay | Admitting: Family Medicine

## 2017-01-03 ENCOUNTER — Ambulatory Visit (INDEPENDENT_AMBULATORY_CARE_PROVIDER_SITE_OTHER): Payer: Medicare Other | Admitting: *Deleted

## 2017-01-03 ENCOUNTER — Encounter: Payer: Self-pay | Admitting: *Deleted

## 2017-01-03 VITALS — BP 123/71 | HR 77 | Ht 62.0 in | Wt 142.0 lb

## 2017-01-03 DIAGNOSIS — Z0000 Encounter for general adult medical examination without abnormal findings: Secondary | ICD-10-CM

## 2017-01-03 DIAGNOSIS — Z23 Encounter for immunization: Secondary | ICD-10-CM

## 2017-01-03 NOTE — Patient Instructions (Addendum)
Thank you for coming in for your Annual Wellness Visit today!  Please continue with your health diet and exercise habits   I encourage you to increase your water intake- a goal to aim for is about half of your body weight in ounces per day  You received your first Shingrix vaccine today.  You will need the 2nd dose in 2-6 months  We will be glad to file a copy of your Advanced Directives in your medical record- please bring our office a copy if you are interested in this.      Preventive Care 71 Years and Older, Female Preventive care refers to lifestyle choices and visits with your health care provider that can promote health and wellness. What does preventive care include?  A yearly physical exam. This is also called an annual well check.  Dental exams once or twice a year.  Routine eye exams. Ask your health care provider how often you should have your eyes checked.  Personal lifestyle choices, including: ? Daily care of your teeth and gums. ? Regular physical activity. ? Eating a healthy diet. ? Avoiding tobacco and drug use. ? Limiting alcohol use. ? Practicing safe sex. ? Taking low-dose aspirin every day. ? Taking vitamin and mineral supplements as recommended by your health care provider. What happens during an annual well check? The services and screenings done by your health care provider during your annual well check will depend on your age, overall health, lifestyle risk factors, and family history of disease. Counseling Your health care provider may ask you questions about your:  Alcohol use.  Tobacco use.  Drug use.  Emotional well-being.  Home and relationship well-being.  Sexual activity.  Eating habits.  History of falls.  Memory and ability to understand (cognition).  Work and work Statistician.  Reproductive health.  Screening You may have the following tests or measurements:  Height, weight, and BMI.  Blood pressure.  Lipid and  cholesterol levels. These may be checked every 5 years, or more frequently if you are over 32 years old.  Skin check.  Lung cancer screening. You may have this screening every year starting at age 34 if you have a 30-pack-year history of smoking and currently smoke or have quit within the past 15 years.  Fecal occult blood test (FOBT) of the stool. You may have this test every year starting at age 68.  Flexible sigmoidoscopy or colonoscopy. You may have a sigmoidoscopy every 5 years or a colonoscopy every 10 years starting at age 80.  Hepatitis C blood test.  Hepatitis B blood test.  Sexually transmitted disease (STD) testing.  Diabetes screening. This is done by checking your blood sugar (glucose) after you have not eaten for a while (fasting). You may have this done every 1-3 years.  Bone density scan. This is done to screen for osteoporosis. You may have this done starting at age 18.  Mammogram. This may be done every 1-2 years. Talk to your health care provider about how often you should have regular mammograms.  Talk with your health care provider about your test results, treatment options, and if necessary, the need for more tests. Vaccines Your health care provider may recommend certain vaccines, such as:  Influenza vaccine. This is recommended every year.  Tetanus, diphtheria, and acellular pertussis (Tdap, Td) vaccine. You may need a Td booster every 10 years.  Varicella vaccine. You may need this if you have not been vaccinated.  Zoster vaccine. You may need this  after age 81.  Measles, mumps, and rubella (MMR) vaccine. You may need at least one dose of MMR if you were born in 1957 or later. You may also need a second dose.  Pneumococcal 13-valent conjugate (PCV13) vaccine. One dose is recommended after age 28.  Pneumococcal polysaccharide (PPSV23) vaccine. One dose is recommended after age 48.  Meningococcal vaccine. You may need this if you have certain  conditions.  Hepatitis A vaccine. You may need this if you have certain conditions or if you travel or work in places where you may be exposed to hepatitis A.  Hepatitis B vaccine. You may need this if you have certain conditions or if you travel or work in places where you may be exposed to hepatitis B.  Haemophilus influenzae type b (Hib) vaccine. You may need this if you have certain conditions.  Talk to your health care provider about which screenings and vaccines you need and how often you need them. This information is not intended to replace advice given to you by your health care provider. Make sure you discuss any questions you have with your health care provider. Document Released: 08/15/2015 Document Revised: 04/07/2016 Document Reviewed: 05/20/2015 Elsevier Interactive Patient Education  2017 [employer].

## 2017-01-03 NOTE — Progress Notes (Addendum)
Subjective:   Marissa Perez is a 71 y.o. female who presents for Medicare Annual (Subsequent) preventive examination. 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  She lives at home with her husband.  They have one son who lives in [place], [place].  She is very involved in her church, [employer]- where she participates in the prayer shawl ministry and choir.  She enjoys knitting, reading, playing the piano and flute, and spending time at their beach house in [place], [place].  She and her husband describe eating a well balanced diet, and walking 2-3 miles, 6 days per week. She has not had any hospitalizations or surgeries in the past year.        Objective:     Vitals: BP 123/71   Pulse 77   Ht 5\' 2"  (1.575 m)   Wt 142 lb (64.4 kg)   BMI 25.97 kg/m   Body mass index is 25.97 kg/m.   Tobacco History  Smoking Status  . Never Smoker  Smokeless Tobacco  . Never Used       Past Medical History:  Diagnosis Date  . Acid reflux   . Allergy   . Cancer ([place])    SKIN  . Diverticulosis   . History of ETT 7/10   abnormal   . History of stomach ulcers 2008   positive h. pylori  . Hyperlipidemia   . Hypertension   . Hypothyroidism   . IBS (irritable bowel syndrome)    Past Surgical History:  Procedure Laterality Date  . [place]  . MOHS SURGERY    . TONSILLECTOMY  1951  . TOTAL ABDOMINAL HYSTERECTOMY W/ BILATERAL SALPINGOOPHORECTOMY  1990   Dr. Tamala Julian / fibroids    Family History  Problem Relation Age of Onset  . Diabetes Father   . Heart disease Father   . Heart attack Father   . Breast cancer Mother   . Breast cancer Paternal Grandmother        Aunt also   . Breast cancer Unknown        Family History  . Colon cancer Neg Hx    History  Sexual Activity  . Sexual activity: No    Outpatient Encounter Prescriptions as of 01/03/2017  Medication Sig  . aspirin (ASPIRIN LOW DOSE) 81 MG EC tablet Take 81 mg by mouth daily.    [name] Kitchen atorvastatin  (LIPITOR) 20 MG tablet Take 1 tablet (20 mg total) by mouth daily.  [name] Kitchen BIOTIN PO Take by mouth.    . Calcium Citrate-Vitamin D (CALCIUM CITRATE + D3 MAXIMUM) 315-250 MG-UNIT TABS Take 1 tablet by mouth 2 (two) times daily.  . Cholecalciferol (VITAMIN D PO) Take 5,000 Units by mouth daily.  . clobetasol (TEMOVATE) 0.05 % external solution   . conjugated estrogens (PREMARIN) vaginal cream USE 1 GM VAGINALLY 2 TO 3 TIMES A WEEK  . Cyanocobalamin (B-12) 1000 MCG SUBL Place under the tongue.  . ezetimibe (ZETIA) 10 MG tablet Take 1 tablet (10 mg total) by mouth daily.  [name] Kitchen gabapentin (NEURONTIN) 100 MG capsule Take 2 caps at bedtime  . hydrochlorothiazide (MICROZIDE) 12.5 MG capsule TAKE (1) CAPSULE DAILY  . levothyroxine (SYNTHROID, LEVOTHROID) 50 MCG tablet TAKE 1/2 TAB ON MON, WED, FRI & 1 TAB ALL OTHER DAYS  . levothyroxine (SYNTHROID, LEVOTHROID) 50 MCG tablet TAKE 1/2 TAB ON MON, WED, FRI & 1 TAB ON TUES AND THURS  . meloxicam (MOBIC) 15 MG tablet Take 1 tablet (15  mg total) by mouth daily.  . Multiple Vitamin (MULTI-VITAMIN PO) Take by mouth daily.    [name] Kitchen OVER THE COUNTER MEDICATION Take 4 capsules by mouth daily. JR Carlson's Elite Omega Fish oil  . Probiotic Product (PROBIOTIC DAILY PO) Take by mouth.  . ranitidine (ZANTAC) 150 MG tablet Take 150 mg by mouth every morning.  . valsartan (DIOVAN) 320 MG tablet TAKE (1) TABLET DAILY AS DIRECTED.   No facility-administered encounter medications on file as of 01/03/2017.     Activities of Daily Living In your present state of health, do you have any difficulty performing the following activities: 01/03/2017  Hearing? N  Vision? N  Difficulty concentrating or making decisions? N  Walking or climbing stairs? N  Dressing or bathing? N  Doing errands, shopping? N  Some recent data might be hidden   Describes no hearing deficit.  Sees well with her glasses.  Sees Dr. Thom Chimes every other year for eye exam.      Patient Care Team: Chipper Herb, MD as PCP - General (Family Medicine) Gaynelle Arabian, MD as Consulting Physician (Orthopedic Surgery) Suella Broad, MD as Consulting Physician (Physical Medicine and Rehabilitation) Druscilla Brownie, MD as Consulting Physician (Dermatology) Dyke Maes, OD as Consulting Physician (Optometry)  Erskine Emery, MD as Consulting Physician (Gastroenterology)         Assessment:     Exercise Activities and Dietary recommendations Time (Minutes): 60, Frequency (Times/Week): 6, Weekly Exercise (Minutes/Week): 360, Intensity: Moderate, Exercise limited by: None identified  Goals    . Increase water intake          Aim for drinking half of your body weight in ounces of water daily.        Fall Risk Fall Risk  01/03/2017 12/01/2016 07/05/2016 06/29/2016 02/25/2016  Falls in the past year? No No Yes No Yes  Number falls in past yr: - - 1 - 1  Injury with Fall? - - No - Yes   Depression Screen PHQ 2/9 Scores 01/03/2017 12/01/2016 07/05/2016 06/29/2016  PHQ - 2 Score 0 0 0 0     Cognitive Function MMSE - Mini Mental State Exam 01/03/2017 12/12/2015  Orientation to time 5 5  Orientation to Place 5 5  Registration 3 3  Attention/ Calculation 5 5  Recall 3 3  Language- name 2 objects 2 2  Language- repeat 1 1  Language- follow 3 step command 3 3  Language- read & follow direction 1 1  Write a sentence 1 1  Copy design 1 1  Total score 30 30        Immunization History  Administered Date(s) Administered  . Influenza Split 05/21/2013  . Influenza, Quadrivalent, Recombinant, Inj, Pf 05/06/2016  . Influenza,inj,Quad PF,36+ Mos 05/02/2015  . Influenza,inj,quad, With Preservative 05/10/2014  . Pneumococcal Conjugate-13 08/29/2013  . Tdap 02/03/2011  . Zoster Recombinat (Shingrix) 01/03/2017   Screening Tests Health Maintenance  Topic Date Due  . INFLUENZA VACCINE  03/02/2017  . COLON CANCER SCREENING ANNUAL FOBT  12/03/2017  . MAMMOGRAM  10/20/2018  . TETANUS/TDAP  02/10/2021   . DEXA SCAN  06/08/2025  . Hepatitis C Screening  Completed  . PNA vac Low Risk Adult  Completed      Plan:     I have personally reviewed and noted the following in the patient's chart:   . Medical and social history . Use of alcohol, tobacco or illicit drugs  . Current medications and supplements . Functional ability and status .  Nutritional status . Physical activity . Advanced directives . List of other physicians . Hospitalizations, surgeries, and ER visits in previous 12 months . Vitals . Screenings to include cognitive, depression, and falls . Referrals and appointments  In addition, I have reviewed and discussed with patient certain preventive protocols, quality metrics, and best practice recommendations. A written personalized care plan for preventive services as well as general preventive health recommendations were provided to patient.     Denyce Robert, RN  01/03/2017  Arrie Senate MD I have reviewed and agree with the above AWV documentation.   Arrie Senate MD

## 2017-01-09 NOTE — Progress Notes (Signed)
Cardiology Office Note   Date:  01/12/2017   ID:  Marissa Perez, DOB June 15, 1946, MRN [id number]  PCP:  Marissa Herb, MD  Cardiologist:   Marissa Breeding, MD  Referring:  Marissa Herb, MD  Chief Complaint  Patient presents with  . Hypertension      History of Present Illness: Marissa Perez is a 71 y.o. female who presents for evaluation of HTN and dyslipidemia with the family first-degree relatives with early coronary disease. She has not had any past cardiac history. She did have a stress test years ago and I see some mention was abnormal but I can't find that she said she never needed follow-up. She walks daily at least a half hour.  The patient denies any new symptoms such as chest discomfort, neck or arm discomfort. There has been no new shortness of breath, PND or orthopnea. There have been no reported palpitations, presyncope or syncope.    Past Medical History:  Diagnosis Date  . Acid reflux   . Allergy   . Cancer ([place])    SKIN  . Diverticulosis   . History of ETT 7/10   abnormal   . History of stomach ulcers 2008   positive h. pylori  . Hyperlipidemia   . Hypertension   . Hypothyroidism   . IBS (irritable bowel syndrome)     Past Surgical History:  Procedure Laterality Date  . [place]  . MOHS SURGERY    . TONSILLECTOMY  1951  . TOTAL ABDOMINAL HYSTERECTOMY W/ BILATERAL SALPINGOOPHORECTOMY  1990   Dr. Tamala Julian / fibroids      Current Outpatient Prescriptions  Medication Sig Dispense Refill  . aspirin (ASPIRIN LOW DOSE) 81 MG EC tablet Take 81 mg by mouth daily.      [name] Kitchen atorvastatin (LIPITOR) 20 MG tablet Take 1 tablet (20 mg total) by mouth daily. 90 tablet 3  . BIOTIN PO Take by mouth.      . Calcium Citrate-Vitamin D (CALCIUM CITRATE + D3 MAXIMUM) 315-250 MG-UNIT TABS Take 1 tablet by mouth 2 (two) times daily. 120 tablet   . Cholecalciferol (VITAMIN D PO) Take 5,000 Units by mouth daily.    . clobetasol (TEMOVATE) 0.05 % external  solution Apply 1 application topically daily as needed.     . conjugated estrogens (PREMARIN) vaginal cream USE 1 GM VAGINALLY 2 TO 3 TIMES A WEEK 30 g 11  . Cyanocobalamin (B-12) 1000 MCG SUBL Place under the tongue.    . ezetimibe (ZETIA) 10 MG tablet Take 1 tablet (10 mg total) by mouth daily. 90 tablet 0  . gabapentin (NEURONTIN) 100 MG capsule Take 2 caps at bedtime 180 capsule 3  . hydrochlorothiazide (MICROZIDE) 12.5 MG capsule TAKE (1) CAPSULE DAILY 90 capsule 1  . levothyroxine (SYNTHROID, LEVOTHROID) 50 MCG tablet TAKE 1/2 TABLET BY MOUTH ON MON, WED, FRI & 1 TAB ALL OTHER DAYS 90 tablet 3  . meloxicam (MOBIC) 15 MG tablet Take 1 tablet (15 mg total) by mouth daily. 30 tablet 3  . Multiple Vitamin (MULTI-VITAMIN PO) Take by mouth daily.      [name] Kitchen OVER THE COUNTER MEDICATION Take 4 capsules by mouth daily. JR Carlson's Elite Omega Fish oil    . Probiotic Product (PROBIOTIC DAILY PO) Take by mouth.    . ranitidine (ZANTAC) 150 MG tablet Take 150 mg by mouth every morning.    . valsartan (DIOVAN) 320 MG tablet TAKE (1) TABLET DAILY AS DIRECTED. [place]  tablet 1   No current facility-administered medications for this visit.     Allergies:   Codeine; Femring [estradiol]; Cephalexin; Erythromycin; and Penicillins    Social History:  The patient  reports that she has never smoked. She has never used smokeless tobacco. She reports that she drinks about 8.4 oz of alcohol per week . She reports that she does not use drugs.   Family History:  The patient's family history includes Breast cancer in her mother and paternal grandmother; Diabetes in her father; Heart attack (age of onset: 40) in her father; Heart disease in her father.    ROS:  Please see the history of present illness.   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   All other systems are reviewed and negative.    PHYSICAL EXAM: VS:  BP  140/76   Pulse 74   Ht 5\' 2"  (1.575 m)   Wt 143 lb (64.9 kg)   BMI 26.16 kg/m  , BMI Body mass index is 26.16 kg/m. GENERAL:  Well appearing HEENT:  Pupils equal round and reactive, fundi not visualized, oral mucosa unremarkable NECK:  No jugular venous distention, waveform within normal limits, carotid upstroke brisk and symmetric, no bruits, no thyromegaly LYMPHATICS:  No cervical, inguinal adenopathy LUNGS:  Clear to auscultation bilaterally BACK:  No CVA tenderness CHEST:  Unremarkable HEART:  PMI not displaced or sustained,S1 and S2 within normal limits, no S3, no S4, no clicks, no rubs, no murmurs ABD:  Flat, positive bowel sounds normal in frequency in pitch, no bruits, no rebound, no guarding, no midline pulsatile mass, no hepatomegaly, no splenomegaly EXT:  2 plus pulses throughout, no edema, no cyanosis no clubbing SKIN:  No rashes no nodules NEURO:  Cranial nerves II through XII grossly intact, motor grossly intact throughout PSYCH:  Cognitively intact, oriented to person place and time    EKG:  EKG is ordered today. The ekg ordered today demonstrates sinus rhythm, rate 74, axis within normal limits, intervals within normal limits, no acute ST-T.   Recent Labs: 02/25/2016: TSH 2.350 07/05/2016: Magnesium 1.9 11/25/2016: ALT 27; BUN 13; Creatinine, Ser 0.68; Hemoglobin 12.7; Platelets 286; Potassium 4.4; Sodium 137    Lipid Panel    Component Value Date/Time   CHOL 150 11/25/2016 0850   CHOL 181 11/07/2012 0844   TRIG 115 11/25/2016 0850   TRIG 152 (H) 11/07/2012 0844   HDL 67 11/25/2016 0850   HDL 65 11/07/2012 0844   LDLCALC 61 05/03/2014 1401   LDLCALC 86 11/07/2012 0844      Wt Readings from Last 3 Encounters:  01/12/17 143 lb (64.9 kg)  01/03/17 142 lb (64.4 kg)  12/01/16 143 lb (64.9 kg)      Other studies Reviewed: Additional studies/ records that were reviewed today include: Labs. Review of the above records demonstrates:  Please see elsewhere in  the note.     ASSESSMENT AND PLAN:   HTN: The blood pressure is at target. No change in medications is indicated. We will continue with therapeutic lifestyle changes (TLC).  HYPERLIPIDEMIA:  Lipids are well controlled.  RISK REDUCTIONS:  She has no high-risk symptoms. She's quite active. I don't think further cardiovascular testing is warranted at this point. She should continue with her primary risk reduction and in particular her exercise and we discussed this.   Current medicines are reviewed at length with the patient today.  The patient  does not have concerns regarding medicines.  The following changes have been made:  no change  Labs/ tests ordered today include: None  Orders Placed This Encounter  Procedures  . EKG 12-Lead     Disposition:   FU with me as needed.     Signed, Marissa Breeding, MD  01/12/2017 4:04 PM    [facility]

## 2017-01-12 ENCOUNTER — Encounter: Payer: Self-pay | Admitting: Cardiology

## 2017-01-12 ENCOUNTER — Ambulatory Visit (INDEPENDENT_AMBULATORY_CARE_PROVIDER_SITE_OTHER): Payer: Medicare Other | Admitting: Cardiology

## 2017-01-12 VITALS — BP 140/76 | HR 74 | Ht 62.0 in | Wt 143.0 lb

## 2017-01-12 DIAGNOSIS — I10 Essential (primary) hypertension: Secondary | ICD-10-CM

## 2017-01-12 DIAGNOSIS — E785 Hyperlipidemia, unspecified: Secondary | ICD-10-CM | POA: Diagnosis not present

## 2017-01-12 NOTE — Patient Instructions (Signed)
Medication Instructions:  The current medical regimen is effective;  continue present plan and medications.  Follow-Up: Follow up as needed.  Thank you for choosing [facility]!!     

## 2017-03-02 ENCOUNTER — Other Ambulatory Visit: Payer: Self-pay | Admitting: Family Medicine

## 2017-03-14 IMAGING — CR DG KNEE 1-2V*R*
2 series · 2 of 2 positions shown · non-contrast
Comparison: None.

CLINICAL DATA: 69-year-old female with posterior pain for 3 months.
Initial encounter.

EXAM:
RIGHT KNEE - 1-2 VIEW

[view not recorded (1 of 2)]
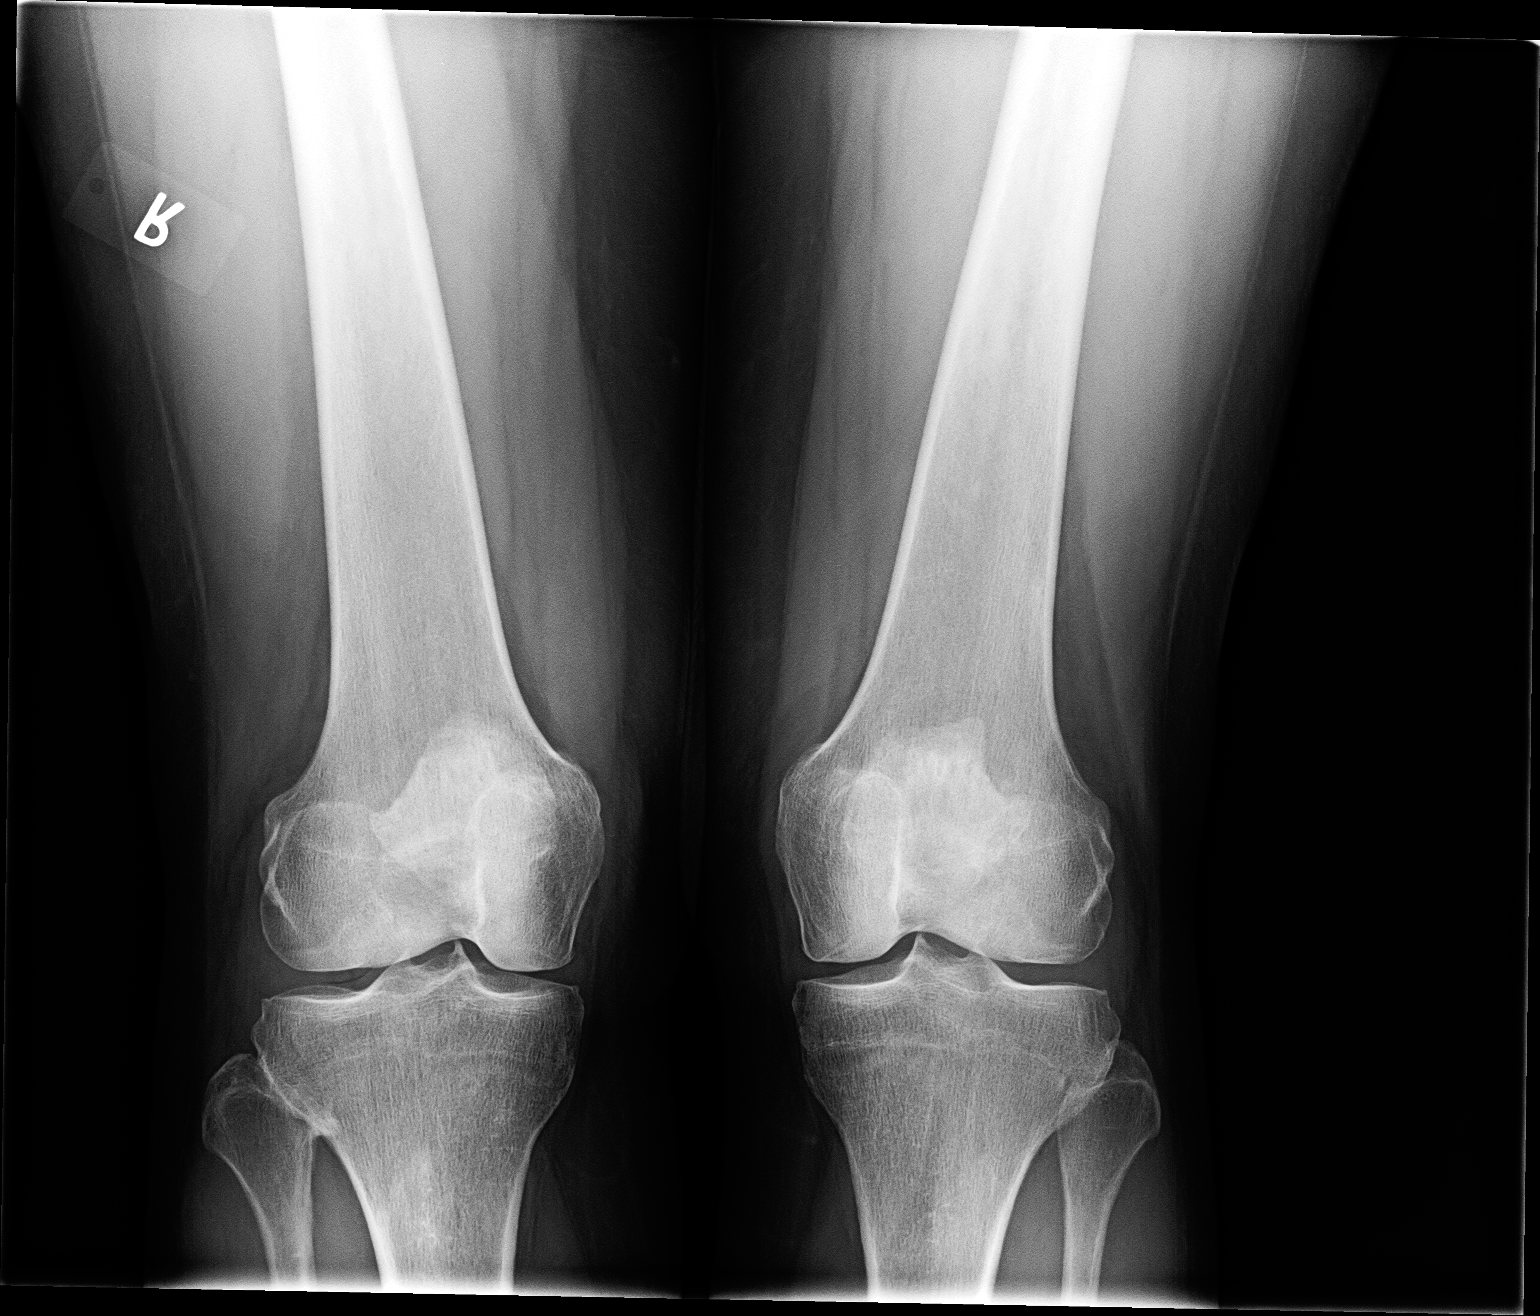

[view not recorded (2 of 2)]
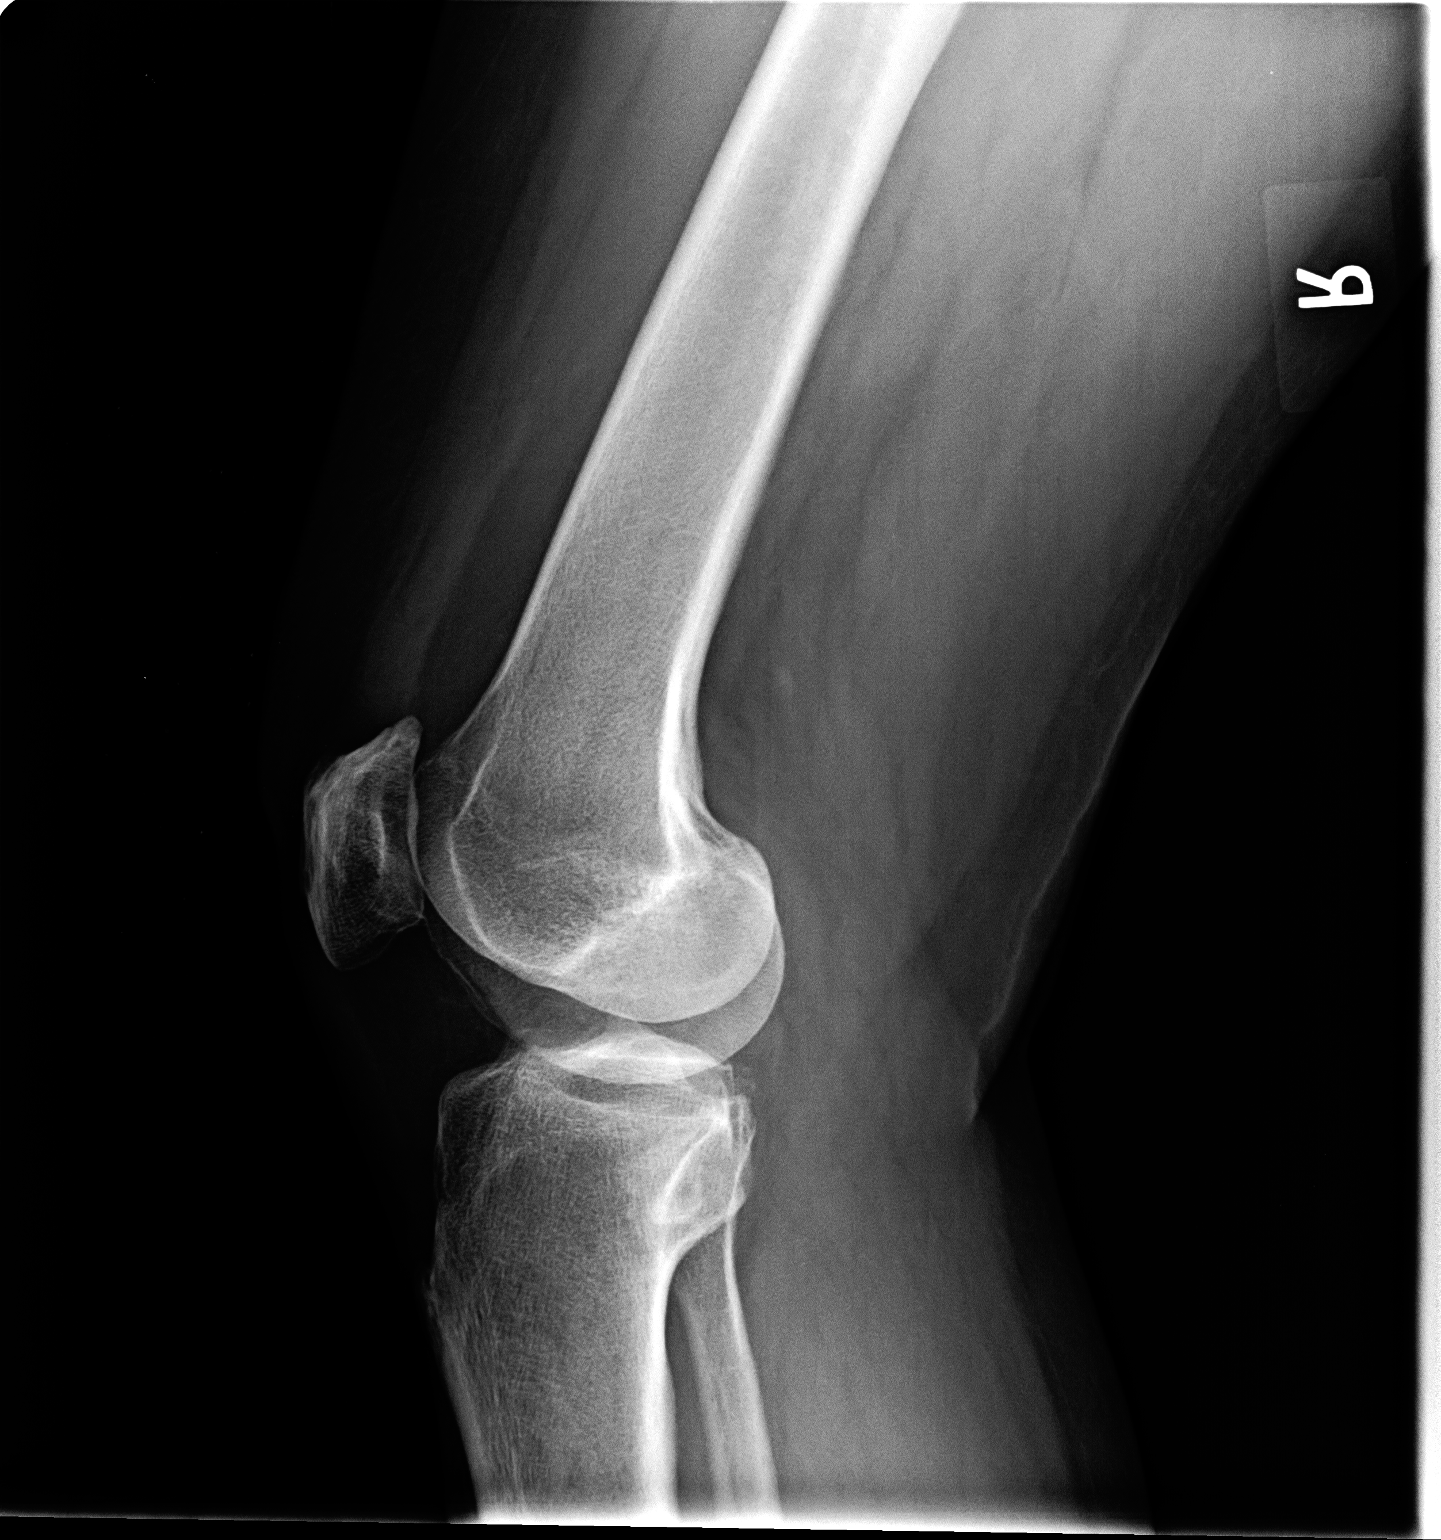

[2 of 2 positions shown; findings below may reference images not displayed]

FINDINGS: Two-view examination right knee:

No fracture or dislocation.

Mild patella [place].  Mild patellofemoral joint degenerative changes.

No radiopaque loose body noted.

Single view left knee without acute abnormality.
IMPRESSION: No fracture or dislocation.

Mild patella [place]. Mild right patellofemoral joint degenerative
changes.

## 2017-03-14 IMAGING — CR DG CHEST 2V
2 series · 2 of 2 positions shown · non-contrast
Comparison: 08/29/2013

CLINICAL DATA: Routine physical, history of hypertension

EXAM:
CHEST  2 VIEW

[view not recorded (1 of 2)]
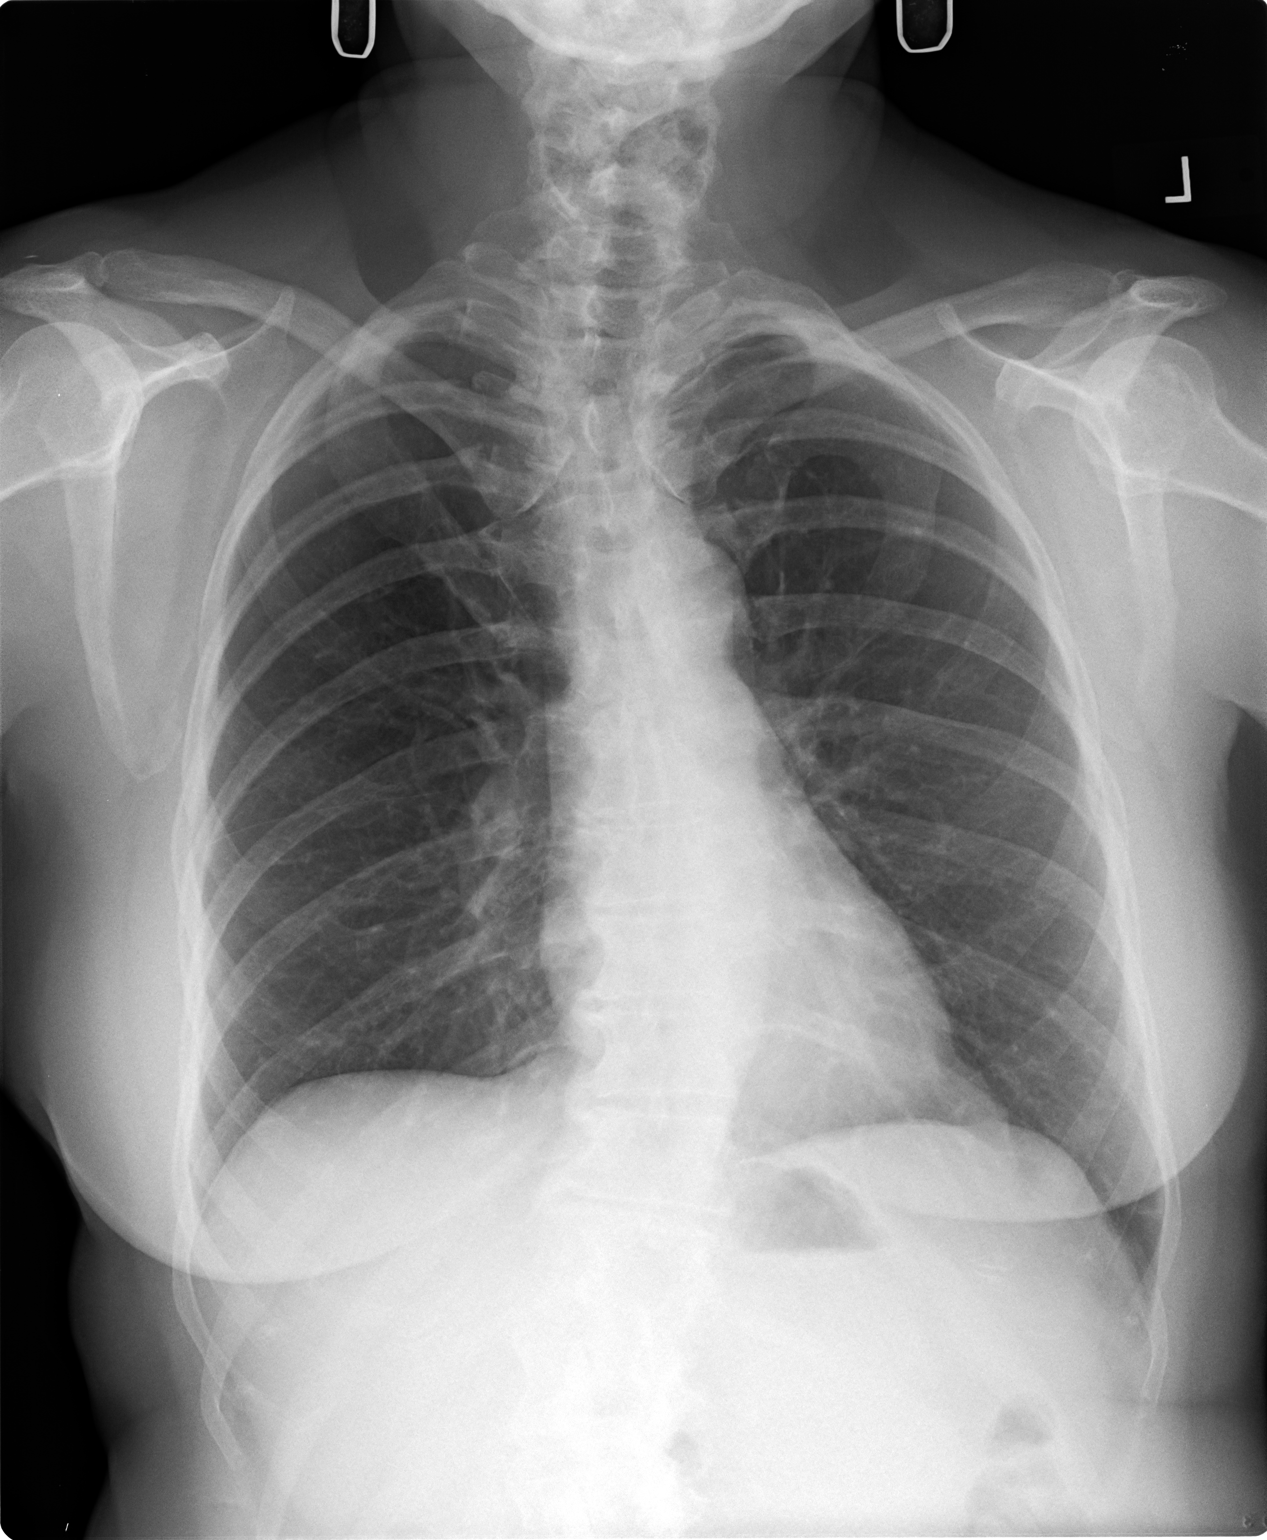

[view not recorded (2 of 2)]
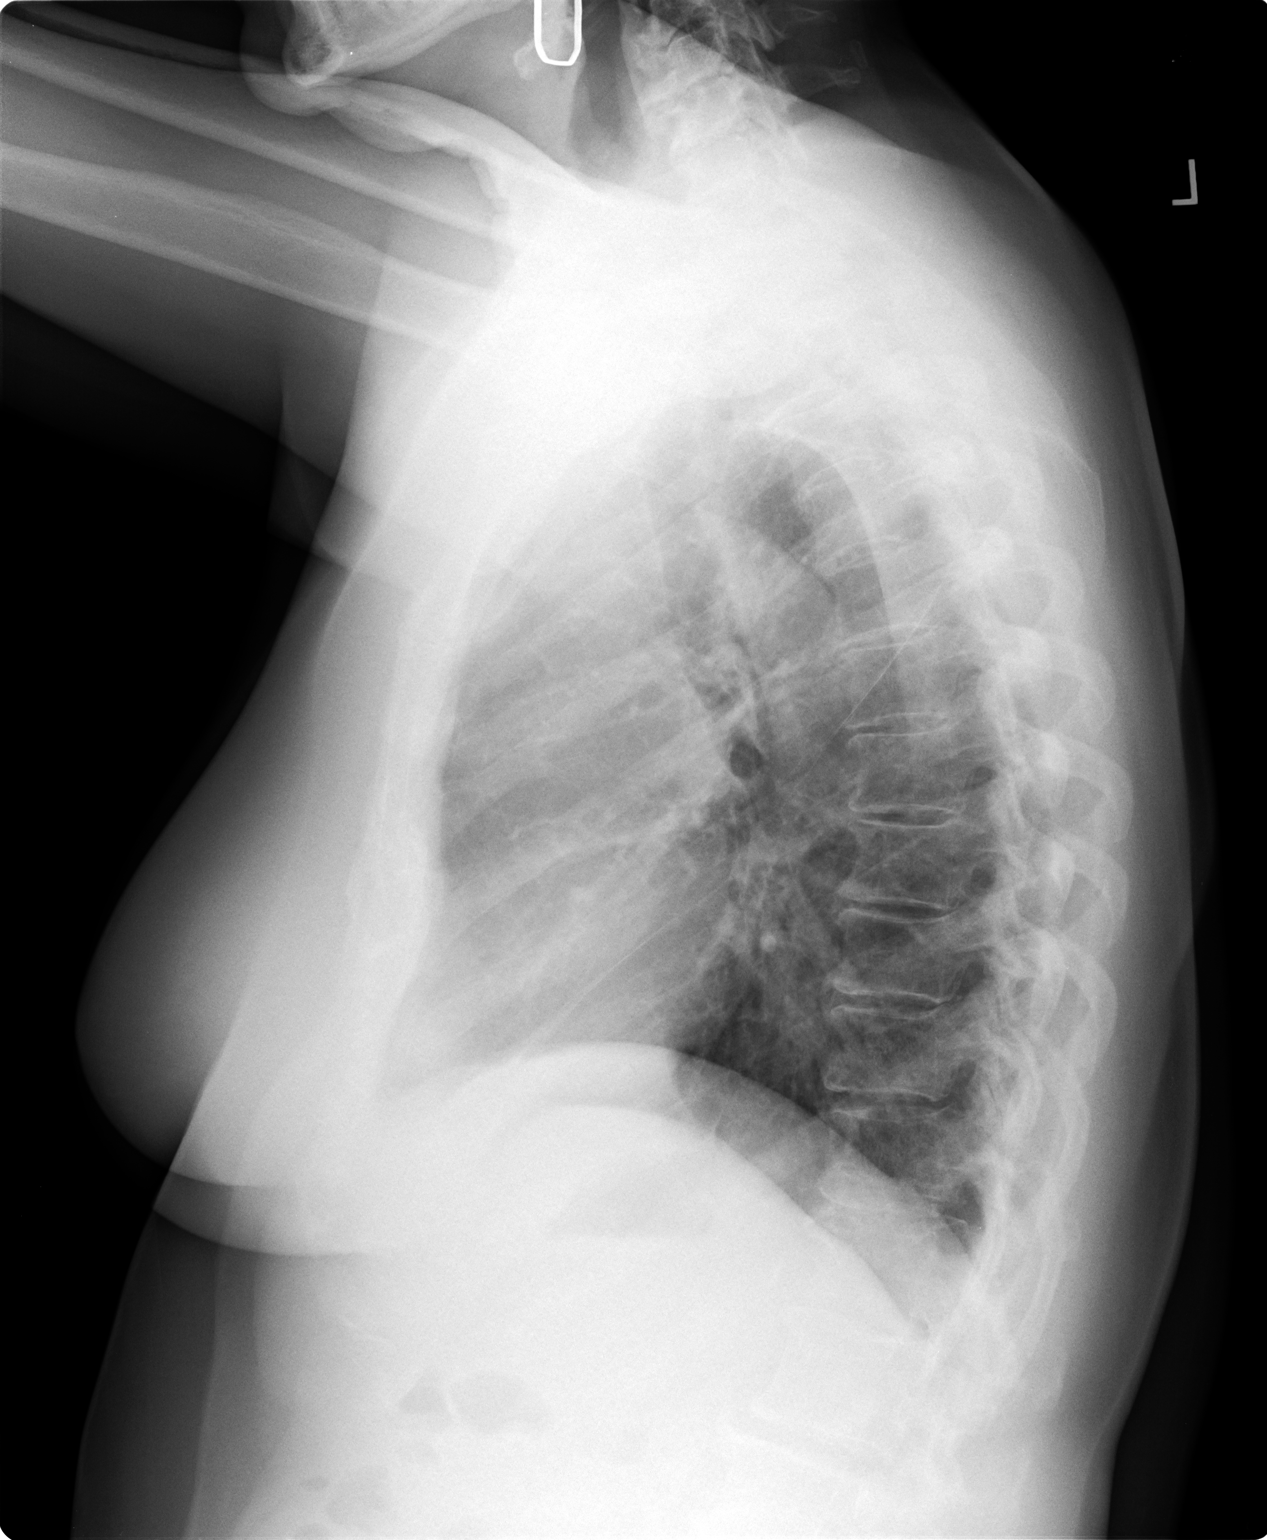

[2 of 2 positions shown; findings below may reference images not displayed]

FINDINGS: The heart size and mediastinal contours are within normal limits.
Both lungs are clear. The visualized skeletal structures show fusion
of the left first and second ribs anteriorly.
IMPRESSION: No active cardiopulmonary disease.

## 2017-04-05 ENCOUNTER — Other Ambulatory Visit: Payer: Self-pay | Admitting: Family Medicine

## 2017-04-07 ENCOUNTER — Other Ambulatory Visit: Payer: Medicare Other

## 2017-04-07 DIAGNOSIS — Z8249 Family history of ischemic heart disease and other diseases of the circulatory system: Secondary | ICD-10-CM

## 2017-04-07 DIAGNOSIS — E559 Vitamin D deficiency, unspecified: Secondary | ICD-10-CM

## 2017-04-07 DIAGNOSIS — I10 Essential (primary) hypertension: Secondary | ICD-10-CM

## 2017-04-07 DIAGNOSIS — K219 Gastro-esophageal reflux disease without esophagitis: Secondary | ICD-10-CM

## 2017-04-07 DIAGNOSIS — E7800 Pure hypercholesterolemia, unspecified: Secondary | ICD-10-CM

## 2017-04-08 LAB — CBC WITH DIFFERENTIAL/PLATELET
BASOS ABS: 0.1 10*3/uL (ref 0.0–0.2)
Basos: 1 %
EOS (ABSOLUTE): 0.2 10*3/uL (ref 0.0–0.4)
EOS: 4 %
HEMATOCRIT: 38.8 % (ref 34.0–46.6)
Hemoglobin: 12.6 g/dL (ref 11.1–15.9)
IMMATURE GRANULOCYTES: 0 %
Immature Grans (Abs): 0 10*3/uL (ref 0.0–0.1)
LYMPHS ABS: 1.7 10*3/uL (ref 0.7–3.1)
Lymphs: 38 %
MCH: 30.9 pg (ref 26.6–33.0)
MCHC: 32.5 g/dL (ref 31.5–35.7)
MCV: 95 fL (ref 79–97)
MONOS ABS: 0.3 10*3/uL (ref 0.1–0.9)
Monocytes: 7 %
NEUTROS PCT: 50 %
Neutrophils Absolute: 2.2 10*3/uL (ref 1.4–7.0)
PLATELETS: 314 10*3/uL (ref 150–379)
RBC: 4.08 x10E6/uL (ref 3.77–5.28)
RDW: 13.2 % (ref 12.3–15.4)
WBC: 4.4 10*3/uL (ref 3.4–10.8)

## 2017-04-08 LAB — HEPATIC FUNCTION PANEL
ALBUMIN: 5.1 g/dL — AB (ref 3.5–4.8)
ALT: 30 IU/L (ref 0–32)
AST: 28 IU/L (ref 0–40)
Alkaline Phosphatase: 51 IU/L (ref 39–117)
BILIRUBIN TOTAL: 0.3 mg/dL (ref 0.0–1.2)
Bilirubin, Direct: 0.12 mg/dL (ref 0.00–0.40)
TOTAL PROTEIN: 7.1 g/dL (ref 6.0–8.5)

## 2017-04-08 LAB — VITAMIN D 25 HYDROXY (VIT D DEFICIENCY, FRACTURES): Vit D, 25-Hydroxy: 51.9 ng/mL (ref 30.0–100.0)

## 2017-04-08 LAB — NMR, LIPOPROFILE
Cholesterol: 143 mg/dL (ref 100–199)
HDL CHOLESTEROL BY NMR: 69 mg/dL (ref >39)
HDL Particle Number: 50.4 umol/L (ref >=30.5)
LDL PARTICLE NUMBER: 1010 nmol/L — AB (ref <1000)
LDL Size: 20.4 nm (ref >20.5)
LDL-C: 55 mg/dL (ref 0–99)
LP-IR Score: 60 — ABNORMAL HIGH (ref <=45)
SMALL LDL PARTICLE NUMBER: 548 nmol/L — AB (ref <=527)
TRIGLYCERIDES BY NMR: 96 mg/dL (ref 0–149)

## 2017-04-08 LAB — BMP8+EGFR
BUN/Creatinine Ratio: 18 (ref 12–28)
BUN: 13 mg/dL (ref 8–27)
CALCIUM: 9.9 mg/dL (ref 8.7–10.3)
CHLORIDE: 96 mmol/L (ref 96–106)
CO2: 23 mmol/L (ref 20–29)
Creatinine, Ser: 0.71 mg/dL (ref 0.57–1.00)
GFR calc non Af Amer: 86 mL/min/{1.73_m2} (ref >59)
GFR, EST AFRICAN AMERICAN: 99 mL/min/{1.73_m2} (ref >59)
Glucose: 93 mg/dL (ref 65–99)
POTASSIUM: 4.6 mmol/L (ref 3.5–5.2)
Sodium: 135 mmol/L (ref 134–144)

## 2017-04-11 ENCOUNTER — Ambulatory Visit (INDEPENDENT_AMBULATORY_CARE_PROVIDER_SITE_OTHER): Payer: Medicare Other

## 2017-04-11 ENCOUNTER — Encounter: Payer: Self-pay | Admitting: Family Medicine

## 2017-04-11 ENCOUNTER — Ambulatory Visit (INDEPENDENT_AMBULATORY_CARE_PROVIDER_SITE_OTHER): Payer: Medicare Other | Admitting: Family Medicine

## 2017-04-11 VITALS — BP 108/63 | HR 71 | Temp 97.6°F | Ht 62.0 in | Wt 143.0 lb

## 2017-04-11 DIAGNOSIS — Z23 Encounter for immunization: Secondary | ICD-10-CM

## 2017-04-11 DIAGNOSIS — E7800 Pure hypercholesterolemia, unspecified: Secondary | ICD-10-CM

## 2017-04-11 DIAGNOSIS — E559 Vitamin D deficiency, unspecified: Secondary | ICD-10-CM

## 2017-04-11 DIAGNOSIS — K219 Gastro-esophageal reflux disease without esophagitis: Secondary | ICD-10-CM

## 2017-04-11 DIAGNOSIS — M79671 Pain in right foot: Secondary | ICD-10-CM

## 2017-04-11 DIAGNOSIS — I10 Essential (primary) hypertension: Secondary | ICD-10-CM

## 2017-04-11 DIAGNOSIS — Z8249 Family history of ischemic heart disease and other diseases of the circulatory system: Secondary | ICD-10-CM

## 2017-04-11 MED ORDER — EZETIMIBE 10 MG PO TABS: 10.0000 mg | ORAL_TABLET | Freq: Every day | ORAL | 3 refills | Status: DC

## 2017-04-11 MED ORDER — HYDROCHLOROTHIAZIDE 12.5 MG PO CAPS: ORAL_CAPSULE | ORAL | 3 refills | Status: DC

## 2017-04-11 NOTE — Progress Notes (Signed)
Subjective:    Patient ID: Marissa Perez, female    DOB: 11/24/1945, 71 y.o.   MRN: [id number]  HPI Pt here for follow up and management of chronic medical problems which includes hyperlipidemia and hypertension. She is taking medication regularly.The patient has had lab work done recently and this will be reviewed with her during the visit today. She is complaining of some right foot pain and is sensitive to. Cholesterol numbers with advanced lipid testing were increased from previous readings. The total LDL particle number was 1010 and previously this was 885. LDL C is good. Triglycerides are good and the HDL particle number good cholesterol is good. The blood sugar and renal function tests were normal. The CBC was within normal limits. On her liver function tests, the albumin was slightly increased and this is been the case in the past all of the other liver function tests were normal. The vitamin D level was good at 51.9. It appears that she is due to get her pelvic exam and we will make sure that she arranges to get this. Her vital signs were stable and her weight was stable and the same as it was at her last visit. The patient says that she [place] the [place] in her right foot where she had a previous fractures started bothering her more and it continues to bother her at times now with walking. She also has the tongue sensitivity with vascular use cool mist humidification and stop using overhead fan and use nasal saline for this. She denies any chest pain pressure tightness or shortness of breath. She denies any trouble with swallowing including heartburn indigestion nausea vomiting diarrhea or blood in the stool. She rarely has problems with her reflux. There's been no change in bowel habits. At the time of her last colonoscopy she was told that she needed another one in 10 years. There is no family history of colon cancer. She is passing her water without problems.      Patient Active  Problem List   Diagnosis Date Noted  . Family history of breast cancer 07/05/2016  . Skin cancer 07/05/2016  . Vitamin D deficiency 07/05/2016  . Hypothyroidism 12/11/2012  . Hyperlipidemia 12/11/2012  . Hypertension 12/11/2012  . GERD (gastroesophageal reflux disease) 12/11/2012   Outpatient Encounter Prescriptions as of 04/11/2017  Medication Sig  . aspirin (ASPIRIN LOW DOSE) 81 MG EC tablet Take 81 mg by mouth daily.    [name] Kitchen atorvastatin (LIPITOR) 20 MG tablet Take 1 tablet (20 mg total) by mouth daily.  [name] Kitchen BIOTIN PO Take by mouth.    . Calcium Citrate-Vitamin D (CALCIUM CITRATE + D3 MAXIMUM) 315-250 MG-UNIT TABS Take 1 tablet by mouth 2 (two) times daily.  . Cholecalciferol (VITAMIN D PO) Take 5,000 Units by mouth daily.  . clobetasol (TEMOVATE) 0.05 % external solution Apply 1 application topically daily as needed.   . conjugated estrogens (PREMARIN) vaginal cream USE 1 GM VAGINALLY 2 TO 3 TIMES A WEEK  . Cyanocobalamin (B-12) 1000 MCG SUBL Place under the tongue.  . ezetimibe (ZETIA) 10 MG tablet Take 1 tablet (10 mg total) by mouth daily.  [name] Kitchen gabapentin (NEURONTIN) 100 MG capsule Take 2 caps at bedtime  . hydrochlorothiazide (MICROZIDE) 12.5 MG capsule TAKE (1) CAPSULE DAILY  . levothyroxine (SYNTHROID, LEVOTHROID) 50 MCG tablet TAKE 1/2 TABLET BY MOUTH ON MON, WED, FRI & 1 TAB ALL OTHER DAYS  . meloxicam (MOBIC) 15 MG tablet Take 1 tablet (15 mg total)  by mouth daily.  . Multiple Vitamin (MULTI-VITAMIN PO) Take by mouth daily.    [name] Kitchen OVER THE COUNTER MEDICATION Take 4 capsules by mouth daily. JR Carlson's Elite Omega Fish oil  . Probiotic Product (PROBIOTIC DAILY PO) Take by mouth.  . ranitidine (ZANTAC) 150 MG tablet Take 150 mg by mouth every morning.  . valsartan (DIOVAN) 320 MG tablet TAKE (1) TABLET DAILY AS DIRECTED.   No facility-administered encounter medications on file as of 04/11/2017.      Review of Systems  Constitutional: Negative.   HENT: Negative.        Tongue  sensitivity  Eyes: Negative.   Respiratory: Negative.   Cardiovascular: Negative.   Gastrointestinal: Negative.   Endocrine: Negative.   Genitourinary: Negative.   Musculoskeletal: Positive for arthralgias (right foot pain ).  Skin: Negative.   Allergic/Immunologic: Negative.   Neurological: Negative.   Hematological: Negative.   Psychiatric/Behavioral: Negative.        Objective:   Physical Exam  Constitutional: She is oriented to person, place, and time. She appears well-developed and well-nourished. No distress.  The patient is pleasant and alert  HENT:  Head: Normocephalic and atraumatic.  Right Ear: External ear normal.  Left Ear: External ear normal.  Mouth/Throat: Oropharynx is clear and moist. No oropharyngeal exudate.  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 Conjunctivae and EOM are normal. Right eye exhibits no discharge. Left eye exhibits no discharge. No scleral icterus.  Neck: Normal range of motion. Neck supple. No thyromegaly present.  No bruits thyromegaly or anterior cervical adenopathy  Cardiovascular: Normal rate, regular rhythm, normal heart sounds and intact distal pulses.   No murmur heard. The heart is regular at 72/m  Pulmonary/Chest: Effort normal and breath sounds normal. No respiratory distress. She has no wheezes. She has no rales.  Clear anteriorly and posteriorly  Abdominal: Soft. Bowel sounds are normal. She exhibits no mass. There is no tenderness. There is no rebound and no guarding.  No abdominal tenderness masses bruits or thyroid enlargement  Musculoskeletal: Normal range of motion. She exhibits tenderness. She exhibits no edema.  Slight tenderness at the base of the fifth metatarsal of the right foot. Minimal swelling.  Lymphadenopathy:    She has no cervical adenopathy.  Neurological: She is alert and oriented to person, place, and  time. She has normal reflexes. No cranial nerve deficit.  Skin: Skin is warm and dry. No rash noted.  Psychiatric: She has a normal mood and affect. Her behavior is normal. Judgment and thought content normal.  Nursing note and vitals reviewed.   BP 108/63 (BP Location: Left Arm)   Pulse 71   Temp 97.6 F (36.4 C) (Oral)   Ht 5\' 2"  (1.575 m)   Wt 143 lb (64.9 kg)   BMI 26.16 kg/m        Assessment & Plan:  1. Vitamin D deficiency -Vitamin D level is good and she will continue with current treatment  2. Pure hypercholesterolemia -The total LDL particle number is slightly elevated this time and the patient attributes this to the lack of exercise secondary to her right foot pain and this may very well be the case. No change in treatment regimen just try to get more exercise and watch diet more closely.  3. Essential hypertension -The blood pressure is good and she will continue with current treatment  4. Gastroesophageal reflux disease, esophagitis  presence not specified -Continue with antireflux measures and current treatment  5. Family history of heart disease -Continue with as aggressive therapeutic lifestyle changes as possible plus statin drug.  6. Need for zoster vaccination - Varicella-zoster vaccine IM (Shingrix)  7. Right foot pain - DG Foot Complete Right; Future -Take meloxicam a little more regularly for a couple weeks while watching stomach to make sure that it does not cause GI irritation. Use warm wet compresses. We will make sure that her orthopedic surgeon gets a copy of this report.  Meds ordered this encounter  Medications  . ezetimibe (ZETIA) 10 MG tablet    Sig: Take 1 tablet (10 mg total) by mouth daily.    Dispense:  90 tablet    Refill:  3  . hydrochlorothiazide (MICROZIDE) 12.5 MG capsule    Sig: TAKE (1) CAPSULE DAILY    Dispense:  90 capsule    Refill:  3   Patient Instructions                       Medicare Annual Wellness Visit  [facility] and the medical providers at [facility] strive to bring you the best medical care.  In doing so we not only want to address your current medical conditions and concerns but also to detect new conditions early and prevent illness, disease and health-related problems.    Medicare offers a yearly Wellness Visit which allows our clinical staff to assess your need for preventative services including immunizations, lifestyle education, counseling to decrease risk of preventable diseases and screening for fall risk and other medical concerns.    This visit is provided free of charge (no copay) for all Medicare recipients. The clinical pharmacists at [facility] have begun to conduct these Wellness Visits which will also include a thorough review of all your medications.    As you primary medical provider recommend that you make an appointment for your Annual Wellness Visit if you have not done so already this year.  You may set up this appointment before you leave today or you may call back ([phone]) and schedule an appointment.  Please make sure when you call that you mention that you are scheduling your Annual Wellness Visit with the clinical pharmacist so that the appointment may be made for the proper length of time.     Continue current medications. Continue good therapeutic lifestyle changes which include good diet and exercise. Fall precautions discussed with patient. If an FOBT was given today- please return it to our front desk. If you are over 27 years old - you may need Prevnar 84 or the adult Pneumonia vaccine.  **Flu shots are available--- please call and schedule a FLU-CLINIC appointment**  After your visit with [place] today you will receive a survey in the mail or online from [employer] regarding your care with [place]. Please take a moment to fill this out. Your feedback is very important to [place] as you can help [place] better understand your patient  needs as well as improve your experience and satisfaction. WE CARE ABOUT YOU!!!   Take meloxicam a little bit more regularly for a couple weeks after eating and use warm wet compresses to right foot We will call with results of x-rays as soon as they become available We will make sure that the orthopedic surgeon gets a copy of the x-rays Discontinue the use of the overhead fan and use cool mist humidification Use  nasal saline frequently during the day Use cool mist humidification at nighttime    Arrie Senate MD

## 2017-04-11 NOTE — Patient Instructions (Addendum)
Medicare Annual Wellness Visit  [facility] and the medical providers at [facility] strive to bring you the best medical care.  In doing so we not only want to address your current medical conditions and concerns but also to detect new conditions early and prevent illness, disease and health-related problems.    Medicare offers a yearly Wellness Visit which allows our clinical staff to assess your need for preventative services including immunizations, lifestyle education, counseling to decrease risk of preventable diseases and screening for fall risk and other medical concerns.    This visit is provided free of charge (no copay) for all Medicare recipients. The clinical pharmacists at [facility] have begun to conduct these Wellness Visits which will also include a thorough review of all your medications.    As you primary medical provider recommend that you make an appointment for your Annual Wellness Visit if you have not done so already this year.  You may set up this appointment before you leave today or you may call back ([phone]) and schedule an appointment.  Please make sure when you call that you mention that you are scheduling your Annual Wellness Visit with the clinical pharmacist so that the appointment may be made for the proper length of time.     Continue current medications. Continue good therapeutic lifestyle changes which include good diet and exercise. Fall precautions discussed with patient. If an FOBT was given today- please return it to our front desk. If you are over 35 years old - you may need Prevnar 44 or the adult Pneumonia vaccine.  **Flu shots are available--- please call and schedule a FLU-CLINIC appointment**  After your visit with [place] today you will receive a survey in the mail or online from [employer] regarding your care with [place]. Please take a moment to fill this out. Your feedback is very  important to [place] as you can help [place] better understand your patient needs as well as improve your experience and satisfaction. WE CARE ABOUT YOU!!!   Take meloxicam a little bit more regularly for a couple weeks after eating and use warm wet compresses to right foot We will call with results of x-rays as soon as they become available We will make sure that the orthopedic surgeon gets a copy of the x-rays Discontinue the use of the overhead fan and use cool mist humidification Use nasal saline frequently during the day Use cool mist humidification at nighttime

## 2017-05-10 ENCOUNTER — Ambulatory Visit (INDEPENDENT_AMBULATORY_CARE_PROVIDER_SITE_OTHER): Payer: Medicare Other | Admitting: Nurse Practitioner

## 2017-05-10 ENCOUNTER — Encounter: Payer: Self-pay | Admitting: Nurse Practitioner

## 2017-05-10 VITALS — BP 117/72 | HR 80 | Temp 97.2°F | Ht 62.0 in | Wt 144.8 lb

## 2017-05-10 DIAGNOSIS — Z01419 Encounter for gynecological examination (general) (routine) without abnormal findings: Secondary | ICD-10-CM | POA: Diagnosis not present

## 2017-05-10 DIAGNOSIS — Z23 Encounter for immunization: Secondary | ICD-10-CM | POA: Diagnosis not present

## 2017-05-10 NOTE — Progress Notes (Signed)
   Subjective:    Patient ID: Marissa Perez, female    DOB: [birth year], 71 y.o.   MRN: [id number]  HPI Marissa Perez is a regular patient of Dr. Laurance Flatten that comes in today for Pap, pelvic and  breast exam. She last saw Dr. Laurance Flatten for routine follow up on 04/11/17. She is doing well today without any complaints.     Review of Systems  Constitutional: Negative for activity change and appetite change.  HENT: Negative.   Eyes: Negative for pain.  Respiratory: Negative for shortness of breath.   Cardiovascular: Negative for chest pain, palpitations and leg swelling.  Gastrointestinal: Negative for abdominal pain.  Endocrine: Negative for polydipsia.  Genitourinary: Negative.   Skin: Negative for rash.  Neurological: Negative for dizziness, weakness and headaches.  Hematological: Does not bruise/bleed easily.  Psychiatric/Behavioral: Negative.   All other systems reviewed and are negative.      Objective:   Physical Exam  Constitutional: She is oriented to person, place, and time. She appears well-developed and well-nourished.  HENT:  Head: Normocephalic.  Right Ear: Hearing, tympanic membrane, external ear and ear canal normal.  Left Ear: Hearing, tympanic membrane, external ear and ear canal normal.  Nose: Nose normal.  Mouth/Throat: Uvula is midline and oropharynx is clear and moist.  Eyes: Pupils are equal, round, and reactive to light. Conjunctivae and EOM are normal.  Neck: Trachea normal, normal range of motion and full passive range of motion without pain. Neck supple. No JVD present. Carotid bruit is not present. No thyroid mass and no thyromegaly present.  Cardiovascular: Normal rate, regular rhythm, normal heart sounds and intact distal pulses.  Exam reveals no gallop and no friction rub.   No murmur heard. Pulmonary/Chest: Effort normal and breath sounds normal. Right breast exhibits no inverted nipple, no mass, no nipple discharge, no skin change and no tenderness. Left  breast exhibits no inverted nipple, no mass, no nipple discharge, no skin change and no tenderness.  Abdominal: Soft. Bowel sounds are normal. She exhibits no distension and no mass. There is no tenderness.  Genitourinary: Vagina normal and uterus normal. No breast swelling, tenderness, discharge or bleeding. No vaginal discharge found.  Genitourinary Comments: bimanual exam-No adnexal masses or tenderness. Cervical stenosis-   Musculoskeletal: Normal range of motion.  Lymphadenopathy:    She has no cervical adenopathy.  Neurological: She is alert and oriented to person, place, and time. She has normal reflexes.  Skin: Skin is warm and dry.  Psychiatric: She has a normal mood and affect. Her behavior is normal. Judgment and thought content normal.    BP 117/72   Pulse 80   Temp (!) 97.2 F (36.2 C) (Oral)   Ht 5\' 2"  (1.575 m)   Wt 144 lb 12.8 oz (65.7 kg)   BMI 26.48 kg/m         Assessment & Plan:   1. Gynecologic exam normal    Keep follow up appointment with DR. Rebbeca Paul prn  Mary-Margaret Hassell Done, FNP

## 2017-05-10 NOTE — Patient Instructions (Signed)

## 2017-08-03 ENCOUNTER — Other Ambulatory Visit: Payer: Self-pay | Admitting: Family Medicine

## 2017-08-19 ENCOUNTER — Other Ambulatory Visit: Payer: Medicare Other

## 2017-08-19 DIAGNOSIS — E7800 Pure hypercholesterolemia, unspecified: Secondary | ICD-10-CM

## 2017-08-19 DIAGNOSIS — K219 Gastro-esophageal reflux disease without esophagitis: Secondary | ICD-10-CM

## 2017-08-19 DIAGNOSIS — E559 Vitamin D deficiency, unspecified: Secondary | ICD-10-CM

## 2017-08-19 DIAGNOSIS — Z8249 Family history of ischemic heart disease and other diseases of the circulatory system: Secondary | ICD-10-CM

## 2017-08-19 DIAGNOSIS — I10 Essential (primary) hypertension: Secondary | ICD-10-CM

## 2017-08-19 NOTE — Addendum Note (Signed)
Addended by: Zannie Cove on: 08/19/2017 09:48 AM   Modules accepted: Orders

## 2017-08-20 LAB — BMP8+EGFR
BUN / CREAT RATIO: 17 (ref 12–28)
BUN: 13 mg/dL (ref 8–27)
CHLORIDE: 99 mmol/L (ref 96–106)
CO2: 23 mmol/L (ref 20–29)
Calcium: 9.7 mg/dL (ref 8.7–10.3)
Creatinine, Ser: 0.77 mg/dL (ref 0.57–1.00)
GFR calc non Af Amer: 78 mL/min/{1.73_m2} (ref >59)
GFR, EST AFRICAN AMERICAN: 90 mL/min/{1.73_m2} (ref >59)
GLUCOSE: 94 mg/dL (ref 65–99)
POTASSIUM: 4.6 mmol/L (ref 3.5–5.2)
SODIUM: 139 mmol/L (ref 134–144)

## 2017-08-20 LAB — HEPATIC FUNCTION PANEL
ALT: 35 IU/L — ABNORMAL HIGH (ref 0–32)
AST: 33 IU/L (ref 0–40)
Albumin: 5.1 g/dL — ABNORMAL HIGH (ref 3.5–4.8)
Alkaline Phosphatase: 61 IU/L (ref 39–117)
Bilirubin Total: 0.3 mg/dL (ref 0.0–1.2)
Bilirubin, Direct: 0.11 mg/dL (ref 0.00–0.40)
Total Protein: 7.6 g/dL (ref 6.0–8.5)

## 2017-08-20 LAB — NMR, LIPOPROFILE
Cholesterol, Total: 154 mg/dL (ref 100–199)
HDL Particle Number: 49 umol/L (ref >=30.5)
HDL-C: 69 mg/dL (ref >39)
LDL PARTICLE NUMBER: 772 nmol/L (ref <1000)
LDL Size: 20.4 nm — ABNORMAL LOW (ref >20.5)
LDL-C: 61 mg/dL (ref 0–99)
LP-IR Score: 42 (ref <=45)
SMALL LDL PARTICLE NUMBER: 425 nmol/L (ref <=527)
Triglycerides: 122 mg/dL (ref 0–149)

## 2017-08-20 LAB — VITAMIN D 25 HYDROXY (VIT D DEFICIENCY, FRACTURES): VIT D 25 HYDROXY: 51.9 ng/mL (ref 30.0–100.0)

## 2017-08-20 LAB — CBC WITH DIFFERENTIAL/PLATELET
BASOS ABS: 0 10*3/uL (ref 0.0–0.2)
BASOS: 1 %
EOS (ABSOLUTE): 0.1 10*3/uL (ref 0.0–0.4)
Eos: 3 %
Hematocrit: 35.7 % (ref 34.0–46.6)
Hemoglobin: 12.7 g/dL (ref 11.1–15.9)
Immature Grans (Abs): 0 10*3/uL (ref 0.0–0.1)
Immature Granulocytes: 0 %
Lymphocytes Absolute: 1.4 10*3/uL (ref 0.7–3.1)
Lymphs: 30 %
MCH: 32.2 pg (ref 26.6–33.0)
MCHC: 35.6 g/dL (ref 31.5–35.7)
MCV: 90 fL (ref 79–97)
MONOS ABS: 0.5 10*3/uL (ref 0.1–0.9)
Monocytes: 11 %
NEUTROS ABS: 2.6 10*3/uL (ref 1.4–7.0)
NEUTROS PCT: 55 %
PLATELETS: 319 10*3/uL (ref 150–379)
RBC: 3.95 x10E6/uL (ref 3.77–5.28)
RDW: 13.4 % (ref 12.3–15.4)
WBC: 4.6 10*3/uL (ref 3.4–10.8)

## 2017-08-24 ENCOUNTER — Ambulatory Visit (INDEPENDENT_AMBULATORY_CARE_PROVIDER_SITE_OTHER): Payer: Medicare Other

## 2017-08-24 ENCOUNTER — Ambulatory Visit: Payer: Medicare Other | Admitting: Family Medicine

## 2017-08-24 ENCOUNTER — Encounter: Payer: Self-pay | Admitting: Family Medicine

## 2017-08-24 VITALS — BP 97/64 | HR 76 | Temp 96.8°F | Ht 62.0 in | Wt 145.0 lb

## 2017-08-24 DIAGNOSIS — E559 Vitamin D deficiency, unspecified: Secondary | ICD-10-CM

## 2017-08-24 DIAGNOSIS — K219 Gastro-esophageal reflux disease without esophagitis: Secondary | ICD-10-CM | POA: Diagnosis not present

## 2017-08-24 DIAGNOSIS — Z780 Asymptomatic menopausal state: Secondary | ICD-10-CM

## 2017-08-24 DIAGNOSIS — E7800 Pure hypercholesterolemia, unspecified: Secondary | ICD-10-CM

## 2017-08-24 DIAGNOSIS — I10 Essential (primary) hypertension: Secondary | ICD-10-CM | POA: Diagnosis not present

## 2017-08-24 DIAGNOSIS — Z8249 Family history of ischemic heart disease and other diseases of the circulatory system: Secondary | ICD-10-CM | POA: Diagnosis not present

## 2017-08-24 DIAGNOSIS — Z13820 Encounter for screening for osteoporosis: Secondary | ICD-10-CM

## 2017-08-24 MED ORDER — ESTROGENS, CONJUGATED 0.625 MG/GM VA CREA: TOPICAL_CREAM | VAGINAL | 11 refills | Status: DC

## 2017-08-24 MED ORDER — VALSARTAN 320 MG PO TABS: 320.0000 mg | ORAL_TABLET | Freq: Every day | ORAL | 3 refills | Status: DC

## 2017-08-24 NOTE — Patient Instructions (Addendum)
Medicare Annual Wellness Visit  [facility] and the medical providers at [facility] strive to bring you the best medical care.  In doing so we not only want to address your current medical conditions and concerns but also to detect new conditions early and prevent illness, disease and health-related problems.    Medicare offers a yearly Wellness Visit which allows our clinical staff to assess your need for preventative services including immunizations, lifestyle education, counseling to decrease risk of preventable diseases and screening for fall risk and other medical concerns.    This visit is provided free of charge (no copay) for all Medicare recipients. The clinical pharmacists at [facility] have begun to conduct these Wellness Visits which will also include a thorough review of all your medications.    As you primary medical provider recommend that you make an appointment for your Annual Wellness Visit if you have not done so already this year.  You may set up this appointment before you leave today or you may call back ([phone]) and schedule an appointment.  Please make sure when you call that you mention that you are scheduling your Annual Wellness Visit with the clinical pharmacist so that the appointment may be made for the proper length of time.  ,m   Continue current medications. Continue good therapeutic lifestyle changes which include good diet and exercise. Fall precautions discussed with patient. If an FOBT was given today- please return it to our front desk. If you are over 65 years old - you may need Prevnar 39 or the adult Pneumonia vaccine.  **Flu shots are available--- please call and schedule a FLU-CLINIC appointment**  After your visit with [place] today you will receive a survey in the mail or online from [employer] regarding your care with [place]. Please take a moment to fill this out. Your feedback is very  important to [place] as you can help [place] better understand your patient needs as well as improve your experience and satisfaction. WE CARE ABOUT YOU!!!   We will call with DEXA scan results as soon as those results become available Continue to be careful and do not put yourself at risk for falling Walk regularly Get pelvic exams and mammograms regularly

## 2017-08-24 NOTE — Progress Notes (Signed)
Subjective:    Patient ID: Marissa Perez, female    DOB: 24-Feb-1946, 72 y.o.   MRN: [id number]  HPI Pt here for follow up and management of chronic medical problems which includes hyperlipidemia and hypertension. She is taking medication regularly.  The patient is doing well with no specific complaints today.  She continues to be careful to not put herself at risk for falling.  She has had lab work done and this will be reviewed with her during the visit today.  All of her cholesterol numbers with advanced lipid testing were excellent except that her LDL size was small.  She will continue with current treatment and physical activity.  CBC was within normal limits with a white blood cell count that was good at 4.6 hemoglobin good at 12.7 and platelet count that was adequate.  The blood sugar renal function and all electrolytes were normal.  The vitamin D level was excellent at 51.9.  One liver function test the albumin remains slightly elevated just as it was 4 months ago and we will continue to monitor that.  ALT another liver function test is slightly elevated and we will continue to monitor that also.  Patient is doing very well with no specific complaints.  She denies any chest pain pressure shortness of breath or respiratory symptoms.  She also denies any trouble with heartburn indigestion nausea vomiting diarrhea or blood in the stool.  She does have occasional indigestion but no more than she would expect secondary to her dietary intake of certain foods.  She is passing her water without problems.  She is due to get an eye exam this year.    Patient Active Problem List   Diagnosis Date Noted  . Family history of breast cancer 07/05/2016  . Skin cancer 07/05/2016  . Vitamin D deficiency 07/05/2016  . Hypothyroidism 12/11/2012  . Hyperlipidemia 12/11/2012  . Hypertension 12/11/2012  . GERD (gastroesophageal reflux disease) 12/11/2012   Outpatient Encounter Medications as of 08/24/2017    Medication Sig  . aspirin (ASPIRIN LOW DOSE) 81 MG EC tablet Take 81 mg by mouth daily.    [name] Kitchen atorvastatin (LIPITOR) 20 MG tablet Take 1 tablet (20 mg total) by mouth daily.  [name] Kitchen BIOTIN PO Take by mouth.    . Calcium Citrate-Vitamin D (CALCIUM CITRATE + D3 MAXIMUM) 315-250 MG-UNIT TABS Take 1 tablet by mouth 2 (two) times daily.  . Cholecalciferol (VITAMIN D PO) Take 5,000 Units by mouth daily.  . clobetasol (TEMOVATE) 0.05 % external solution Apply 1 application topically daily as needed.   . conjugated estrogens (PREMARIN) vaginal cream USE 1 GM VAGINALLY 2 TO 3 TIMES A WEEK  . Cyanocobalamin (B-12) 1000 MCG SUBL Place under the tongue.  . ezetimibe (ZETIA) 10 MG tablet Take 1 tablet (10 mg total) by mouth daily.  [name] Kitchen gabapentin (NEURONTIN) 100 MG capsule Take 2 caps at bedtime  . hydrochlorothiazide (MICROZIDE) 12.5 MG capsule TAKE (1) CAPSULE DAILY  . levothyroxine (SYNTHROID, LEVOTHROID) 50 MCG tablet TAKE 1/2 TABLET BY MOUTH ON MON, WED, FRI & 1 TAB ALL OTHER DAYS  . meloxicam (MOBIC) 15 MG tablet Take 1 tablet (15 mg total) by mouth daily.  . Multiple Vitamin (MULTI-VITAMIN PO) Take by mouth daily.    [name] Kitchen OVER THE COUNTER MEDICATION Take 4 capsules by mouth daily. JR Carlson's Elite Omega Fish oil  . Probiotic Product (PROBIOTIC DAILY PO) Take by mouth.  . ranitidine (ZANTAC) 150 MG tablet Take 150 mg by mouth every  morning.  . valsartan (DIOVAN) 320 MG tablet TAKE (1) TABLET DAILY AS DIRECTED.   No facility-administered encounter medications on file as of 08/24/2017.       Review of Systems  Constitutional: Negative.   HENT: Negative.   Eyes: Negative.   Respiratory: Negative.   Cardiovascular: Negative.   Gastrointestinal: Negative.   Endocrine: Negative.   Genitourinary: Negative.   Musculoskeletal: Negative.   Skin: Negative.   Allergic/Immunologic: Negative.   Neurological: Negative.   Hematological: Negative.   Psychiatric/Behavioral: Negative.        Objective:    Physical Exam  Constitutional: She is oriented to person, place, and time. She appears well-developed and well-nourished.  The patient is pleasant and alert with no specific complaints.  She is being careful to move slowly and not put herself at risk for falling  HENT:  Head: Normocephalic and atraumatic.  Right Ear: External ear normal.  Left Ear: External ear normal.  Nose: Nose normal.  Mouth/Throat: Oropharynx is clear and moist. No oropharyngeal exudate.  Eyes: Conjunctivae and EOM are normal. Pupils are equal, round, and reactive to light. Right eye exhibits no discharge. Left eye exhibits no discharge. No scleral icterus.  She is due an eye exam this year  Neck: Normal range of motion. Neck supple. No thyromegaly present.  No bruits thyromegaly or anterior cervical adenopathy  Cardiovascular: Normal rate, regular rhythm, normal heart sounds and intact distal pulses.  No murmur heard. The heart is regular at 72/min  Pulmonary/Chest: Effort normal and breath sounds normal. No respiratory distress. She has no wheezes. She has no rales.  Clear anteriorly and posteriorly  Abdominal: Soft. Bowel sounds are normal. She exhibits no mass. There is no tenderness. There is no rebound and no guarding.  No abdominal tenderness masses bruits or organ enlargement  Musculoskeletal: Normal range of motion. She exhibits no edema.  Lymphadenopathy:    She has no cervical adenopathy.  Neurological: She is alert and oriented to person, place, and time. She has normal reflexes. No cranial nerve deficit.  Skin: Skin is warm and dry. No rash noted.  Psychiatric: She has a normal mood and affect. Her behavior is normal. Judgment and thought content normal.  Nursing note and vitals reviewed.  BP 97/64 (BP Location: Left Arm)   Pulse 76   Temp (!) 96.8 F (36 C) (Oral)   Ht 5\' 2"  (1.575 m)   Wt 145 lb (65.8 kg)   BMI 26.52 kg/m         Assessment & Plan:  1. Vitamin D deficiency -Continue  current treatment - DG [facility] DEXA; Future  2. Pure hypercholesterolemia -All cholesterol numbers were good and the patient will continue with current treatment and aggressive therapeutic lifestyle changes  3. Essential hypertension -Blood pressure is good she will continue with current treatment  4. Gastroesophageal reflux disease, esophagitis presence not specified -No complaints with reflux today.  5. Family history of heart disease -Continue with aggressive therapeutic lifestyle changes  6. Screening for osteoporosis - DG [facility] DEXA; Future  7. Postmenopausal - DG [facility] DEXA; Future  Meds ordered this encounter  Medications  . valsartan (DIOVAN) 320 MG tablet    Sig: Take 1 tablet (320 mg total) by mouth daily. As directed    Dispense:  90 tablet    Refill:  3  . conjugated estrogens (PREMARIN) vaginal cream    Sig: USE 1 GM VAGINALLY 2 TO 3 TIMES A WEEK    Dispense:  30 g  Refill:  11   Patient Instructions                       Medicare Annual Wellness Visit  [facility] and the medical providers at [facility] strive to bring you the best medical care.  In doing so we not only want to address your current medical conditions and concerns but also to detect new conditions early and prevent illness, disease and health-related problems.    Medicare offers a yearly Wellness Visit which allows our clinical staff to assess your need for preventative services including immunizations, lifestyle education, counseling to decrease risk of preventable diseases and screening for fall risk and other medical concerns.    This visit is provided free of charge (no copay) for all Medicare recipients. The clinical pharmacists at [facility] have begun to conduct these Wellness Visits which will also include a thorough review of all your medications.    As you primary medical provider recommend that you make an appointment for your Annual  Wellness Visit if you have not done so already this year.  You may set up this appointment before you leave today or you may call back ([phone]) and schedule an appointment.  Please make sure when you call that you mention that you are scheduling your Annual Wellness Visit with the clinical pharmacist so that the appointment may be made for the proper length of time.  ,m   Continue current medications. Continue good therapeutic lifestyle changes which include good diet and exercise. Fall precautions discussed with patient. If an FOBT was given today- please return it to our front desk. If you are over 58 years old - you may need Prevnar 58 or the adult Pneumonia vaccine.  **Flu shots are available--- please call and schedule a FLU-CLINIC appointment**  After your visit with [place] today you will receive a survey in the mail or online from [employer] regarding your care with [place]. Please take a moment to fill this out. Your feedback is very important to [place] as you can help [place] better understand your patient needs as well as improve your experience and satisfaction. WE CARE ABOUT YOU!!!   We will call with DEXA scan results as soon as those results become available Continue to be careful and do not put yourself at risk for falling Walk regularly Get pelvic exams and mammograms regularly    Arrie Senate MD

## 2017-08-26 ENCOUNTER — Other Ambulatory Visit: Payer: Self-pay | Admitting: Family Medicine

## 2017-09-15 ENCOUNTER — Encounter: Payer: Self-pay | Admitting: Family Medicine

## 2017-09-21 ENCOUNTER — Ambulatory Visit: Payer: Medicare Other | Admitting: Family Medicine

## 2017-09-21 ENCOUNTER — Encounter: Payer: Self-pay | Admitting: Family Medicine

## 2017-09-21 ENCOUNTER — Ambulatory Visit (INDEPENDENT_AMBULATORY_CARE_PROVIDER_SITE_OTHER): Payer: Medicare Other

## 2017-09-21 ENCOUNTER — Other Ambulatory Visit: Payer: Self-pay | Admitting: Family Medicine

## 2017-09-21 VITALS — BP 148/74 | HR 74 | Temp 97.4°F | Ht 62.0 in | Wt 145.0 lb

## 2017-09-21 DIAGNOSIS — G8929 Other chronic pain: Secondary | ICD-10-CM | POA: Diagnosis not present

## 2017-09-21 DIAGNOSIS — M545 Low back pain: Secondary | ICD-10-CM

## 2017-09-21 MED ORDER — PREDNISONE 10 MG PO TABS: ORAL_TABLET | ORAL | 0 refills | Status: DC

## 2017-09-21 NOTE — Patient Instructions (Signed)
Take meloxicam 7-1/2 mg 1 daily and take this regularly after breakfast for 1 week Take ranitidine 150 mg twice daily before breakfast and supper for the next 7-10 days. If the problems with back continue to flare call us back and we will have the orthopedic surgeon to see you again for possible injections Walking on a flat surface would probably still be good to do if the weather clears enough. Always wear good shoes.

## 2017-09-21 NOTE — Progress Notes (Signed)
Subjective:    Patient ID: Marissa Perez, female    DOB: 1945/08/21, 72 y.o.   MRN: [id number]  HPI Patient here today for lower left side back pain that started 1 week ago while on vacation.  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  The left side low back has been hurting into the left groin and left abdomen area.  Usually is the right side.  This started about 1 week ago.  She is seen the orthopedic surgeon in the past and has had several injections.  Her blood pressure is up today.  The patient did indicate that she had moved her leg from underneath a table and that it got caught and she bruised her foot and this happened the week before her trip to the beach.  The trip to the beach was 1 week ago and the pain was severe that night when she got out of bed and remained severe through the next day.  It gradually got better and today is much improved.  She is back to her stage or level 3 chronic back pain.  She does not recall any other injury.  Some of this discomfort goes to her left groin and left abdomen with some cramping.  Her bowel habits are stable and she is passing her water without problems.   Patient Active Problem List   Diagnosis Date Noted  . Family history of breast cancer 07/05/2016  . Skin cancer 07/05/2016  . Vitamin D deficiency 07/05/2016  . Hypothyroidism 12/11/2012  . Hyperlipidemia 12/11/2012  . Hypertension 12/11/2012  . GERD (gastroesophageal reflux disease) 12/11/2012   Outpatient Encounter Medications as of 09/21/2017  Medication Sig  . aspirin (ASPIRIN LOW DOSE) 81 MG EC tablet Take 81 mg by mouth daily.    [name] Kitchen atorvastatin (LIPITOR) 20 MG tablet Take 1 tablet (20 mg total) by mouth daily.  [name] Kitchen BIOTIN PO Take by mouth.    . Calcium Citrate-Vitamin D (CALCIUM CITRATE + D3 MAXIMUM) 315-250 MG-UNIT TABS Take 1 tablet by mouth 2 (two) times daily.  . Cholecalciferol (VITAMIN D PO) Take 5,000 Units by mouth daily.  . clobetasol (TEMOVATE) 0.05  % external solution Apply 1 application topically daily as needed.   . conjugated estrogens (PREMARIN) vaginal cream USE 1 GM VAGINALLY 2 TO 3 TIMES A WEEK  . Cyanocobalamin (B-12) 1000 MCG SUBL Place under the tongue.  . ezetimibe (ZETIA) 10 MG tablet Take 1 tablet (10 mg total) by mouth daily.  [name] Kitchen gabapentin (NEURONTIN) 100 MG capsule Take 2 caps at bedtime  . hydrochlorothiazide (MICROZIDE) 12.5 MG capsule TAKE (1) CAPSULE DAILY  . levothyroxine (SYNTHROID, LEVOTHROID) 50 MCG tablet TAKE 1/2 TABLET BY MOUTH ON MON, WED, FRI & 1 TAB ALL OTHER DAYS  . meloxicam (MOBIC) 15 MG tablet TAKE 1 TABLET DAILY  . Multiple Vitamin (MULTI-VITAMIN PO) Take by mouth daily.    [name] Kitchen OVER THE COUNTER MEDICATION Take 4 capsules by mouth daily. JR Carlson's Elite Omega Fish oil  . Probiotic Product (PROBIOTIC DAILY PO) Take by mouth.  . ranitidine (ZANTAC) 150 MG tablet Take 150 mg by mouth every morning.  . valsartan (DIOVAN) 320 MG tablet Take 1 tablet (320 mg total) by mouth daily. As directed   No facility-administered encounter medications on file as of 09/21/2017.       Review of Systems  Constitutional: Negative.   HENT: Negative.   Eyes: Negative.   Respiratory: Negative.   Cardiovascular: Negative.   Gastrointestinal:  Negative.   Endocrine: Negative.   Genitourinary: Negative.   Musculoskeletal: Positive for back pain (lower left side - some left groin and left abd pain ).  Skin: Negative.   Allergic/Immunologic: Negative.   Neurological: Negative.   Hematological: Negative.   Psychiatric/Behavioral: Negative.        Objective:   Physical Exam  Constitutional: She is oriented to person, place, and time. She appears well-developed and well-nourished. No distress.  HENT:  Head: Normocephalic and atraumatic.  Eyes: Conjunctivae and EOM are normal. Pupils are equal, round, and reactive to light. Right eye exhibits no discharge. Left eye exhibits no discharge. No scleral icterus.  Neck:  Normal range of motion.  Abdominal: Soft. Bowel sounds are normal. She exhibits no mass. There is no tenderness. There is no rebound and no guarding.  Musculoskeletal: Normal range of motion. She exhibits no edema or tenderness.  With flexion of the hip and external rotation on the left there was some pain in the left low back.  Otherwise leg raising and abduction on the right side were normal.  Neurological: She is alert and oriented to person, place, and time. No cranial nerve deficit.  Lower extremity reflexes were 2+ on the left and 1+ on the right  Skin: Skin is warm and dry. No rash noted.  Psychiatric: She has a normal mood and affect. Her behavior is normal. Judgment and thought content normal.  Nursing note and vitals reviewed.   BP (!) 156/83 (BP Location: Right Wrist)   Pulse 74   Temp (!) 97.4 F (36.3 C) (Oral)   Ht 5\' 2"  (1.575 m)   Wt 145 lb (65.8 kg)   BMI 26.52 kg/m   LS spine films with results pending===     Assessment & Plan:  1. Chronic left-sided low back pain, with sciatica presence unspecified -Take meloxicam 7/2 mg daily after breakfast while protecting the stomach with ranitidine 150 mg before breakfast and supper.  Do this for at least 1 week.  If the pain continues to flare patient will get back in touch with [place]. - DG Lumbar Spine 2-3 Views; Future  Meds ordered this encounter  Medications  . predniSONE (DELTASONE) 10 MG tablet    Sig: Take 1 tab QID x 2 days, take 1 tab TID x 2 days, then 1 tab BID x 2 days, then 1 tab QD x 2 days.    Dispense:  20 tablet    Refill:  0   Prednisone order will be canceled as we will do meloxicam only.  Patient Instructions  Take meloxicam 7-1/2 mg 1 daily and take this regularly after breakfast for 1 week Take ranitidine 150 mg twice daily before breakfast and supper for the next 7-10 days. If the problems with back continue to flare call us back and we will have the orthopedic surgeon to see you again for possible  injections Walking on a flat surface would probably still be good to do if the weather clears enough. Always wear good shoes.  Arrie Senate MD

## 2017-09-22 ENCOUNTER — Encounter: Payer: Self-pay | Admitting: Family Medicine

## 2017-09-22 DIAGNOSIS — I700 Atherosclerosis of aorta: Secondary | ICD-10-CM | POA: Insufficient documentation

## 2017-11-03 ENCOUNTER — Encounter: Payer: Self-pay | Admitting: Family Medicine

## 2017-11-04 ENCOUNTER — Other Ambulatory Visit: Payer: Self-pay | Admitting: *Deleted

## 2017-11-04 DIAGNOSIS — M545 Low back pain: Principal | ICD-10-CM

## 2017-11-04 DIAGNOSIS — G8929 Other chronic pain: Secondary | ICD-10-CM

## 2017-11-10 ENCOUNTER — Encounter: Payer: Self-pay | Admitting: Family Medicine

## 2017-11-10 ENCOUNTER — Other Ambulatory Visit: Payer: Self-pay | Admitting: Family Medicine

## 2017-11-10 DIAGNOSIS — Z1231 Encounter for screening mammogram for malignant neoplasm of breast: Secondary | ICD-10-CM

## 2017-11-10 NOTE — Telephone Encounter (Signed)
Patient needs an appointment for follow-up with PCP, will do refill but she needs to get her thyroid tested

## 2017-11-10 NOTE — Telephone Encounter (Signed)
Last thyroid 02/25/16  DWM

## 2017-11-29 ENCOUNTER — Other Ambulatory Visit: Payer: Self-pay | Admitting: Family Medicine

## 2017-12-07 ENCOUNTER — Other Ambulatory Visit: Payer: Self-pay | Admitting: Family Medicine

## 2017-12-07 ENCOUNTER — Ambulatory Visit
Admission: RE | Admit: 2017-12-07 | Discharge: 2017-12-07 | Disposition: A | Discharge status: Home / Self Care | Payer: Medicare Other | Source: Ambulatory Visit | Attending: Family Medicine | Admitting: Family Medicine

## 2017-12-07 DIAGNOSIS — R928 Other abnormal and inconclusive findings on diagnostic imaging of breast: Secondary | ICD-10-CM

## 2017-12-07 DIAGNOSIS — Z1231 Encounter for screening mammogram for malignant neoplasm of breast: Secondary | ICD-10-CM

## 2017-12-07 DIAGNOSIS — N6489 Other specified disorders of breast: Secondary | ICD-10-CM

## 2017-12-09 ENCOUNTER — Ambulatory Visit: Payer: Medicare Other

## 2017-12-09 ENCOUNTER — Ambulatory Visit
Admission: RE | Admit: 2017-12-09 | Discharge: 2017-12-09 | Disposition: A | Discharge status: Home / Self Care | Payer: Medicare Other | Source: Ambulatory Visit | Attending: Family Medicine | Admitting: Family Medicine

## 2017-12-09 DIAGNOSIS — R928 Other abnormal and inconclusive findings on diagnostic imaging of breast: Secondary | ICD-10-CM

## 2017-12-13 DIAGNOSIS — M5136 Other intervertebral disc degeneration, lumbar region: Secondary | ICD-10-CM | POA: Insufficient documentation

## 2017-12-28 ENCOUNTER — Ambulatory Visit (INDEPENDENT_AMBULATORY_CARE_PROVIDER_SITE_OTHER): Payer: Medicare Other

## 2017-12-28 ENCOUNTER — Ambulatory Visit: Payer: Medicare Other | Admitting: Family Medicine

## 2017-12-28 ENCOUNTER — Encounter: Payer: Self-pay | Admitting: Family Medicine

## 2017-12-28 VITALS — BP 138/72 | HR 81 | Temp 98.0°F | Ht 62.0 in | Wt 141.0 lb

## 2017-12-28 DIAGNOSIS — E559 Vitamin D deficiency, unspecified: Secondary | ICD-10-CM | POA: Diagnosis not present

## 2017-12-28 DIAGNOSIS — M545 Low back pain: Secondary | ICD-10-CM | POA: Diagnosis not present

## 2017-12-28 DIAGNOSIS — K219 Gastro-esophageal reflux disease without esophagitis: Secondary | ICD-10-CM

## 2017-12-28 DIAGNOSIS — I10 Essential (primary) hypertension: Secondary | ICD-10-CM

## 2017-12-28 DIAGNOSIS — E7800 Pure hypercholesterolemia, unspecified: Secondary | ICD-10-CM

## 2017-12-28 DIAGNOSIS — G8929 Other chronic pain: Secondary | ICD-10-CM

## 2017-12-28 DIAGNOSIS — M47816 Spondylosis without myelopathy or radiculopathy, lumbar region: Secondary | ICD-10-CM | POA: Insufficient documentation

## 2017-12-28 DIAGNOSIS — Z8249 Family history of ischemic heart disease and other diseases of the circulatory system: Secondary | ICD-10-CM

## 2017-12-28 MED ORDER — ATORVASTATIN CALCIUM 20 MG PO TABS: 20.0000 mg | ORAL_TABLET | Freq: Every day | ORAL | 3 refills | Status: DC

## 2017-12-28 MED ORDER — HYDROCHLOROTHIAZIDE 12.5 MG PO CAPS: ORAL_CAPSULE | ORAL | 3 refills | Status: DC

## 2017-12-28 MED ORDER — GABAPENTIN 100 MG PO CAPS
ORAL_CAPSULE | ORAL | 3 refills | Status: DC
Start: 2017-12-28 — End: 2018-08-18

## 2017-12-28 MED ORDER — LEVOTHYROXINE SODIUM 50 MCG PO TABS: ORAL_TABLET | ORAL | 3 refills | Status: DC

## 2017-12-28 MED ORDER — VALSARTAN 320 MG PO TABS: 320.0000 mg | ORAL_TABLET | Freq: Every day | ORAL | 3 refills | Status: DC

## 2017-12-28 MED ORDER — MELOXICAM 15 MG PO TABS: 15.0000 mg | ORAL_TABLET | Freq: Every day | ORAL | 3 refills | Status: DC

## 2017-12-28 NOTE — Progress Notes (Signed)
Subjective:    Patient ID: Marissa Perez, female    DOB: 1946/03/20, 72 y.o.   MRN: [id number]  HPI Pt here for follow up and management of chronic medical problems which includes hypertension and hyperlipidemia. He is taking medication regularly.  The patient is doing well overall has some questions about taking aspirin that she is been taking for a long time.  She is requesting refills on all of her medicines.  She is getting a chest x-ray today will be given an FOBT to return and will come back to the office for fasting lab work.  Her last colonoscopy was in March 2015.  Patient is also doing well.  She is up-to-date on her eye exams.  She denies any chest pain pressure tightness or shortness of breath.  She has had some increased problems with indigestion and she is back on some Prilosec currently.  She was just taken Zantac.  She denies any blood in the stool or black tarry bowel movements and is passing her water without problems.  She does have a return appointment scheduled today with orthopedic surgeon because of ongoing back pain and may need another injection.  The patient denies any other complaints.   Patient Active Problem List   Diagnosis Date Noted  . Aortic atherosclerosis ([place]) 09/22/2017  . Family history of breast cancer 07/05/2016  . Skin cancer 07/05/2016  . Vitamin D deficiency 07/05/2016  . Hypothyroidism 12/11/2012  . Hyperlipidemia 12/11/2012  . Hypertension 12/11/2012  . GERD (gastroesophageal reflux disease) 12/11/2012   Outpatient Encounter Medications as of 12/28/2017  Medication Sig  . atorvastatin (LIPITOR) 20 MG tablet Take 1 tablet (20 mg total) by mouth daily.  [name] Kitchen BIOTIN PO Take by mouth.    . Calcium Citrate-Vitamin D (CALCIUM CITRATE + D3 MAXIMUM) 315-250 MG-UNIT TABS Take 1 tablet by mouth 2 (two) times daily.  . Cholecalciferol (VITAMIN D PO) Take 5,000 Units by mouth daily.  . clobetasol (TEMOVATE) 0.05 % external solution Apply 1 application  topically daily as needed.   . Cyanocobalamin (B-12) 1000 MCG SUBL Place under the tongue.  . ezetimibe (ZETIA) 10 MG tablet Take 1 tablet (10 mg total) by mouth daily.  [name] Kitchen gabapentin (NEURONTIN) 100 MG capsule Take 2 capsules at bedtime  . hydrochlorothiazide (MICROZIDE) 12.5 MG capsule TAKE (1) CAPSULE DAILY  . levothyroxine (SYNTHROID, LEVOTHROID) 50 MCG tablet TAKE 1/2 TAB ON MON, WED, FRI & 1 TAB ON TUES AND THURS  . meloxicam (MOBIC) 15 MG tablet TAKE 1 TABLET DAILY  . Multiple Vitamin (MULTI-VITAMIN PO) Take by mouth daily.    [name] Kitchen omeprazole (PRILOSEC) 20 MG capsule Take 20 mg by mouth daily.  [name] Kitchen OVER THE COUNTER MEDICATION Take 4 capsules by mouth daily. [employer] Elite Omega Fish oil  . Probiotic Product (PROBIOTIC DAILY PO) Take by mouth.  . valsartan (DIOVAN) 320 MG tablet Take 1 tablet (320 mg total) by mouth daily. As directed  . aspirin (ASPIRIN LOW DOSE) 81 MG EC tablet Take 81 mg by mouth daily.    [name] Kitchen conjugated estrogens (PREMARIN) vaginal cream USE 1 GM VAGINALLY 2 TO 3 TIMES A WEEK (Patient not taking: Reported on 12/28/2017)  . [DISCONTINUED] predniSONE (DELTASONE) 10 MG tablet Take 1 tab QID x 2 days, take 1 tab TID x 2 days, then 1 tab BID x 2 days, then 1 tab QD x 2 days.  . [DISCONTINUED] ranitidine (ZANTAC) 150 MG tablet Take 150 mg by mouth every morning.   No  facility-administered encounter medications on file as of 12/28/2017.       Review of Systems  Constitutional: Negative.   HENT: Negative.   Eyes: Negative.   Respiratory: Negative.   Cardiovascular: Negative.   Gastrointestinal: Negative.   Endocrine: Negative.   Genitourinary: Negative.   Musculoskeletal: Negative.   Skin: Negative.   Allergic/Immunologic: Negative.   Neurological: Negative.   Hematological: Negative.   Psychiatric/Behavioral: Negative.        Objective:   Physical Exam  Constitutional: She is oriented to person, place, and time. She appears well-developed and well-nourished.    Patient is pleasant and alert and having some ongoing back pain for which she is seeing the orthopedist later today.  HENT:  Head: Normocephalic and atraumatic.  Right Ear: External ear normal.  Left Ear: External ear normal.  Mouth/Throat: Oropharynx is clear and moist.  Slight turbinate congestion bilaterally  Eyes: Pupils are equal, round, and reactive to light. Conjunctivae and EOM are normal. Right eye exhibits no discharge. Left eye exhibits no discharge. No scleral icterus.  Up-to-date on eye exams  Neck: Normal range of motion. Neck supple. No thyromegaly present.  No bruits thyromegaly or anterior cervical adenopathy  Cardiovascular: Normal rate, regular rhythm, normal heart sounds and intact distal pulses.  No murmur heard. Heart has a regular rate and rhythm at 60/min  Pulmonary/Chest: Effort normal and breath sounds normal. She has no wheezes. She has no rales.  Clear anteriorly and posteriorly  Abdominal: Soft. Bowel sounds are normal. She exhibits no mass. There is tenderness. There is no rebound and no guarding.  No abdominal  masses or organ enlargement or suprapubic tenderness.  Patient does have some slight epigastric tenderness.  Musculoskeletal: Normal range of motion. She exhibits no edema.  Lymphadenopathy:    She has no cervical adenopathy.  Neurological: She is alert and oriented to person, place, and time. She displays abnormal reflex. No cranial nerve deficit.  Lower extremity reflexes slightly decreased on the right compared to the left.  Skin: Skin is warm and dry. No rash noted.  Psychiatric: She has a normal mood and affect. Her behavior is normal. Judgment and thought content normal.  The patient is pleasant and alert  Nursing note and vitals reviewed.  BP 138/72 (BP Location: Left Arm)   Pulse 81   Temp 98 F (36.7 C) (Oral)   Ht _0  (1.575 m)   Wt 141 lb (64 kg)   BMI 25.79 kg/m         Assessment & Plan:  1. Pure  hypercholesterolemia -10 you with current treatment and therapeutic lifestyle changes pending results of lab work - [employer] 2 View; Future - CBC with Differential/Platelet; Future - Lipid panel; Future  2. Essential hypertension -Blood pressure is good today and she will continue with current treatment - DG Chest 2 View; Future - BMP8+EGFR; Future - CBC with Differential/Platelet; Future - Hepatic function panel; Future  3. Vitamin D deficiency -Continue with current treatment pending results of lab work - CBC with Differential/Platelet; Future - VITAMIN D 25 Hydroxy (Vit-D Deficiency, Fractures); Future  4. Gastroesophageal reflux disease, esophagitis presence not specified -Take Prilosec more regularly for the next 4 to 6 weeks and then gradually switch back over to ranitidine. - CBC with Differential/Platelet; Future - Hepatic function panel; Future  5. Family history of heart disease -Reduce risk factors keeping cholesterol under control and blood pressure under control and weight down as much as possible - CBC with Differential/Platelet;  Future - Lipid panel; Future  6. Chronic left-sided low back pain, with sciatica presence unspecified -Follow-up with orthopedic surgeon as planned  Meds ordered this encounter  Medications  . valsartan (DIOVAN) 320 MG tablet    Sig: Take 1 tablet (320 mg total) by mouth daily. As directed    Dispense:  90 tablet    Refill:  3  . meloxicam (MOBIC) 15 MG tablet    Sig: Take 1 tablet (15 mg total) by mouth daily.    Dispense:  90 tablet    Refill:  3  . levothyroxine (SYNTHROID, LEVOTHROID) 50 MCG tablet    Sig: TAKE 1/2 TAB ON MON, WED, FRI & 1 TAB ON TUES AND THURS    Dispense:  90 tablet    Refill:  3  . hydrochlorothiazide (MICROZIDE) 12.5 MG capsule    Sig: TAKE (1) CAPSULE DAILY    Dispense:  90 capsule    Refill:  3  . gabapentin (NEURONTIN) 100 MG capsule    Sig: Take 2 capsules at bedtime    Dispense:  180 capsule     Refill:  3  . atorvastatin (LIPITOR) 20 MG tablet    Sig: Take 1 tablet (20 mg total) by mouth daily.    Dispense:  90 tablet    Refill:  3   Patient Instructions                       Medicare Annual Wellness Visit  [facility] and the medical providers at [facility] strive to bring you the best medical care.  In doing so we not only want to address your current medical conditions and concerns but also to detect new conditions early and prevent illness, disease and health-related problems.    Medicare offers a yearly Wellness Visit which allows our clinical staff to assess your need for preventative services including immunizations, lifestyle education, counseling to decrease risk of preventable diseases and screening for fall risk and other medical concerns.    This visit is provided free of charge (no copay) for all Medicare recipients. The clinical pharmacists at [facility] have begun to conduct these Wellness Visits which will also include a thorough review of all your medications.    As you primary medical provider recommend that you make an appointment for your Annual Wellness Visit if you have not done so already this year.  You may set up this appointment before you leave today or you may call back ([phone]) and schedule an appointment.  Please make sure when you call that you mention that you are scheduling your Annual Wellness Visit with the clinical pharmacist so that the appointment may be made for the proper length of time.     Continue current medications. Continue good therapeutic lifestyle changes which include good diet and exercise. Fall precautions discussed with patient. If an FOBT was given today- please return it to our front desk. If you are over 52 years old - you may need Prevnar 48 or the adult Pneumonia vaccine.  **Flu shots are available--- please call and schedule a FLU-CLINIC appointment**  After your visit with  [place] today you will receive a survey in the mail or online from [employer] regarding your care with [place]. Please take a moment to fill this out. Your feedback is very important to [place] as you can help [place] better understand your patient needs as well as improve your experience and satisfaction. WE  CARE ABOUT YOU!!!   Keep follow-up appointment with orthopedic surgeon as planned Continue to stay active physically and do not put yourself at risk for falling If you are having increased heartburn take as little anti-inflammatory medicine as possible but for a period of time of 4 to 6 weeks take the Prilosec more regularly and then try to wean off and get back on Zantac.  In the future if you take anti-inflammatory medicine as you may want to take the Prilosec around the same time you take an anti-inflammatory medicines to reduce irritation on your stomach. This summer drink plenty of water and fluids and stay well-hydrated  Arrie Senate MD

## 2017-12-28 NOTE — Patient Instructions (Addendum)
Medicare Annual Wellness Visit  [facility] and the medical providers at [facility] strive to bring you the best medical care.  In doing so we not only want to address your current medical conditions and concerns but also to detect new conditions early and prevent illness, disease and health-related problems.    Medicare offers a yearly Wellness Visit which allows our clinical staff to assess your need for preventative services including immunizations, lifestyle education, counseling to decrease risk of preventable diseases and screening for fall risk and other medical concerns.    This visit is provided free of charge (no copay) for all Medicare recipients. The clinical pharmacists at [facility] have begun to conduct these Wellness Visits which will also include a thorough review of all your medications.    As you primary medical provider recommend that you make an appointment for your Annual Wellness Visit if you have not done so already this year.  You may set up this appointment before you leave today or you may call back ([phone]) and schedule an appointment.  Please make sure when you call that you mention that you are scheduling your Annual Wellness Visit with the clinical pharmacist so that the appointment may be made for the proper length of time.     Continue current medications. Continue good therapeutic lifestyle changes which include good diet and exercise. Fall precautions discussed with patient. If an FOBT was given today- please return it to our front desk. If you are over 51 years old - you may need Prevnar 68 or the adult Pneumonia vaccine.  **Flu shots are available--- please call and schedule a FLU-CLINIC appointment**  After your visit with [place] today you will receive a survey in the mail or online from [employer] regarding your care with [place]. Please take a moment to fill this out. Your feedback is very  important to [place] as you can help [place] better understand your patient needs as well as improve your experience and satisfaction. WE CARE ABOUT YOU!!!   Keep follow-up appointment with orthopedic surgeon as planned Continue to stay active physically and do not put yourself at risk for falling If you are having increased heartburn take as little anti-inflammatory medicine as possible but for a period of time of 4 to 6 weeks take the Prilosec more regularly and then try to wean off and get back on Zantac.  In the future if you take anti-inflammatory medicine as you may want to take the Prilosec around the same time you take an anti-inflammatory medicines to reduce irritation on your stomach. This summer drink plenty of water and fluids and stay well-hydrated

## 2017-12-29 ENCOUNTER — Other Ambulatory Visit: Payer: Medicare Other

## 2017-12-29 DIAGNOSIS — Z1211 Encounter for screening for malignant neoplasm of colon: Secondary | ICD-10-CM

## 2017-12-29 DIAGNOSIS — K219 Gastro-esophageal reflux disease without esophagitis: Secondary | ICD-10-CM

## 2017-12-29 DIAGNOSIS — E559 Vitamin D deficiency, unspecified: Secondary | ICD-10-CM

## 2017-12-29 DIAGNOSIS — E7800 Pure hypercholesterolemia, unspecified: Secondary | ICD-10-CM

## 2017-12-29 DIAGNOSIS — Z8249 Family history of ischemic heart disease and other diseases of the circulatory system: Secondary | ICD-10-CM

## 2017-12-29 DIAGNOSIS — I10 Essential (primary) hypertension: Secondary | ICD-10-CM

## 2017-12-30 LAB — CBC WITH DIFFERENTIAL/PLATELET
BASOS: 1 %
Basophils Absolute: 0 10*3/uL (ref 0.0–0.2)
EOS (ABSOLUTE): 0.1 10*3/uL (ref 0.0–0.4)
Eos: 3 %
HEMOGLOBIN: 12.3 g/dL (ref 11.1–15.9)
Hematocrit: 36 % (ref 34.0–46.6)
IMMATURE GRANULOCYTES: 0 %
Immature Grans (Abs): 0 10*3/uL (ref 0.0–0.1)
Lymphocytes Absolute: 1.6 10*3/uL (ref 0.7–3.1)
Lymphs: 36 %
MCH: 30.8 pg (ref 26.6–33.0)
MCHC: 34.2 g/dL (ref 31.5–35.7)
MCV: 90 fL (ref 79–97)
MONOCYTES: 5 %
Monocytes Absolute: 0.2 10*3/uL (ref 0.1–0.9)
NEUTROS ABS: 2.4 10*3/uL (ref 1.4–7.0)
NEUTROS PCT: 55 %
PLATELETS: 279 10*3/uL (ref 150–450)
RBC: 4 x10E6/uL (ref 3.77–5.28)
RDW: 13.8 % (ref 12.3–15.4)
WBC: 4.3 10*3/uL (ref 3.4–10.8)

## 2017-12-30 LAB — HEPATIC FUNCTION PANEL
ALBUMIN: 4.8 g/dL (ref 3.5–4.8)
ALK PHOS: 59 IU/L (ref 39–117)
ALT: 39 IU/L — ABNORMAL HIGH (ref 0–32)
AST: 33 IU/L (ref 0–40)
Bilirubin Total: 0.4 mg/dL (ref 0.0–1.2)
Bilirubin, Direct: 0.12 mg/dL (ref 0.00–0.40)
Total Protein: 7.1 g/dL (ref 6.0–8.5)

## 2017-12-30 LAB — BMP8+EGFR
BUN/Creatinine Ratio: 19 (ref 12–28)
BUN: 13 mg/dL (ref 8–27)
CALCIUM: 9.7 mg/dL (ref 8.7–10.3)
CHLORIDE: 98 mmol/L (ref 96–106)
CO2: 22 mmol/L (ref 20–29)
CREATININE: 0.69 mg/dL (ref 0.57–1.00)
GFR calc non Af Amer: 87 mL/min/{1.73_m2} (ref >59)
GFR, EST AFRICAN AMERICAN: 101 mL/min/{1.73_m2} (ref >59)
GLUCOSE: 94 mg/dL (ref 65–99)
Potassium: 4.2 mmol/L (ref 3.5–5.2)
Sodium: 137 mmol/L (ref 134–144)

## 2017-12-30 LAB — LIPID PANEL
CHOLESTEROL TOTAL: 151 mg/dL (ref 100–199)
Chol/HDL Ratio: 2.4 ratio (ref 0.0–4.4)
HDL: 64 mg/dL (ref >39)
LDL CALC: 66 mg/dL (ref 0–99)
TRIGLYCERIDES: 106 mg/dL (ref 0–149)
VLDL Cholesterol Cal: 21 mg/dL (ref 5–40)

## 2017-12-30 LAB — VITAMIN D 25 HYDROXY (VIT D DEFICIENCY, FRACTURES): VIT D 25 HYDROXY: 48.3 ng/mL (ref 30.0–100.0)

## 2018-01-02 LAB — FECAL OCCULT BLOOD, IMMUNOCHEMICAL: FECAL OCCULT BLD: NEGATIVE

## 2018-01-05 ENCOUNTER — Ambulatory Visit: Payer: Medicare Other | Admitting: *Deleted

## 2018-01-10 ENCOUNTER — Ambulatory Visit (INDEPENDENT_AMBULATORY_CARE_PROVIDER_SITE_OTHER): Payer: Medicare Other | Admitting: *Deleted

## 2018-01-10 ENCOUNTER — Encounter: Payer: Self-pay | Admitting: *Deleted

## 2018-01-10 VITALS — BP 124/71 | HR 80 | Ht 62.0 in | Wt 143.0 lb

## 2018-01-10 DIAGNOSIS — Z0000 Encounter for general adult medical examination without abnormal findings: Secondary | ICD-10-CM

## 2018-01-10 NOTE — Patient Instructions (Addendum)
  Marissa Perez , Thank you for taking time to come for your Medicare Wellness Visit. I appreciate your ongoing commitment to your health goals. Please review the following plan we discussed and let me know if I can assist you in the future.   These are the goals we discussed: Goals    . Exercise 150 min/wk Moderate Activity       This is a list of the screening recommended for you and due dates:  Health Maintenance  Topic Date Due  . Flu Shot  03/02/2018  . Stool Blood Test  12/30/2018  . Mammogram  12/08/2019  . Tetanus Vaccine  02/10/2021  . Colon Cancer Screening  10/11/2023  . DEXA scan (bone density measurement)  08/26/2027  .  Hepatitis C: One time screening is recommended by Center for Disease Control  (CDC) for  adults born from [birth year] through 1965.   Completed  . Pneumonia vaccines  Completed

## 2018-01-11 ENCOUNTER — Encounter: Payer: Self-pay | Admitting: *Deleted

## 2018-01-11 NOTE — Progress Notes (Addendum)
Subjective:   Marissa Perez is a 72 y.o. female who presents for a Medicare Annual Wellness Visit. Marissa Perez lives at home with her husband. They have one adult son and one 49 year old grandchild that lives in [place]. 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    Review of Systems    Patient reports that her health is unchanged compared to last year.  Cardiac Risk Factors include: advanced age (>73men, >47 women);dyslipidemia;hypertension   Other systems negative unless otherwise noted in other area.       Current Medications (verified) Outpatient Encounter Medications as of 01/10/2018  Medication Sig  . aspirin (ASPIRIN LOW DOSE) 81 MG EC tablet Take 81 mg by mouth daily.    [name] Kitchen atorvastatin (LIPITOR) 20 MG tablet Take 1 tablet (20 mg total) by mouth daily.  [name] Kitchen BIOTIN PO Take by mouth.    . Calcium Citrate-Vitamin D (CALCIUM CITRATE + D3 MAXIMUM) 315-250 MG-UNIT TABS Take 1 tablet by mouth 2 (two) times daily.  . Cholecalciferol (VITAMIN D PO) Take 5,000 Units by mouth daily.  . clobetasol (TEMOVATE) 0.05 % external solution Apply 1 application topically daily as needed.   . conjugated estrogens (PREMARIN) vaginal cream USE 1 GM VAGINALLY 2 TO 3 TIMES A WEEK  . Cyanocobalamin (B-12) 1000 MCG SUBL Place under the tongue.  . ezetimibe (ZETIA) 10 MG tablet Take 1 tablet (10 mg total) by mouth daily.  [name] Kitchen gabapentin (NEURONTIN) 100 MG capsule Take 2 capsules at bedtime  . hydrochlorothiazide (MICROZIDE) 12.5 MG capsule TAKE (1) CAPSULE DAILY  . levothyroxine (SYNTHROID, LEVOTHROID) 50 MCG tablet TAKE 1/2 TAB ON MON, WED, FRI & 1 TAB ON TUES AND THURS  . meloxicam (MOBIC) 15 MG tablet Take 1 tablet (15 mg total) by mouth daily.  . Multiple Vitamin (MULTI-VITAMIN PO) Take by mouth daily.    [name] Kitchen omeprazole (PRILOSEC) 20 MG capsule Take 20 mg by mouth daily.  [name] Kitchen OVER THE COUNTER MEDICATION Take 4 capsules by mouth daily. JR Carlson's Elite Omega Fish oil  . Probiotic Product  (PROBIOTIC DAILY PO) Take by mouth.  . valsartan (DIOVAN) 320 MG tablet Take 1 tablet (320 mg total) by mouth daily. As directed   No facility-administered encounter medications on file as of 01/10/2018.     Allergies (verified) Codeine; Femring [estradiol]; Cephalexin; Erythromycin; and Penicillins   History: Past Medical History:  Diagnosis Date  . Acid reflux   . Allergy   . Cancer ([place])    SKIN  . Diverticulosis   . History of ETT 7/10   abnormal   . History of stomach ulcers 2008   positive h. pylori  . Hyperlipidemia   . Hypertension   . Hypothyroidism   . IBS (irritable bowel syndrome)    Past Surgical History:  Procedure Laterality Date  . [place]  . MOHS SURGERY    . TONSILLECTOMY  1951  . TOTAL ABDOMINAL HYSTERECTOMY W/ BILATERAL SALPINGOOPHORECTOMY  1990   Dr. Tamala Perez / fibroids    Family History  Problem Relation Age of Onset  . Diabetes Father   . Heart disease Father   . Heart attack Father 48  . Breast cancer Mother   . Breast cancer Paternal Grandmother        Aunt also   . Breast cancer Unknown        Family History  . Breast cancer Maternal Aunt   . Breast cancer Paternal Aunt   . Colon cancer  Neg Hx    Social History   Socioeconomic History  . Marital status: Married    Spouse name: Not on file  . Number of children: 1  . Years of education: 34  . Highest education level: Bachelor's degree (e.g., BA, AB, BS)  Occupational History  . 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 Financial resource strain: Not hard at all  . Food insecurity:    Worry: Never true    Inability: Never true  . Transportation needs:    Medical: No    Non-medical: No  Tobacco Use  . Smoking status: Never Smoker  . Smokeless tobacco: Never Used  Substance and Sexual Activity  . Alcohol use: Yes    Alcohol/week: 8.4 oz    Types: 14 Glasses of wine per week  . Drug use: No  . Sexual activity:  Never  Lifestyle  . Physical activity:    Days per week: 3 days    Minutes per session: 60 min  . Stress: Only a little  Relationships  . Social connections:    Talks on phone: More than three times a week    Gets together: More than three times a week    Attends religious service: More than 4 times per year    Active member of club or organization: Yes    Attends meetings of clubs or organizations: More than 4 times per year    Relationship status: Married  Other Topics Concern  . Not on file  Social History Narrative  . Not on file    Tobacco Use No.   Clinical Intake:   Pain : No/denies pain     Nutritional Status: BMI of 19-24  Normal Diabetes: No  Activities of Daily Living: Independent Ambulation: Independent Medication Administration: Independent Home Management: Independent     Do you feel unsafe in your current relationship?: No Do you feel physically threatened by others?: No Anyone hurting you at home, work, or school?: No Unable to ask?: No  How often do you need to have someone help you when you read instructions, pamphlets, or other written materials from your doctor or pharmacy?: 1 - Never What is the last grade level you completed in school?: bachelor's degree  Interpreter Needed?: No  Information entered by :: Marissa Sicilian, RN    Activities of Daily Living In your present state of health, do you have any difficulty performing the following activities: 01/10/2018  Hearing? N  Vision? N  Comment has yearly eye exams  Difficulty concentrating or making decisions? N  Walking or climbing stairs? N  Dressing or bathing? N  Doing errands, shopping? N  Preparing Food and eating ? N  Using the Toilet? N  In the past six months, have you accidently leaked urine? N  Do you have problems with loss of bowel control? N  Managing your Medications? N  Managing your Finances? N  Housekeeping or managing your Housekeeping? N  Some recent data might  be hidden     Immunizations and Health Maintenance Immunization History  Administered Date(s) Administered  . Influenza Split 05/21/2013  . Influenza, High Dose Seasonal PF 05/10/2017  . Influenza, Quadrivalent, Recombinant, Inj, Pf 05/06/2016  . Influenza,inj,Quad PF,6+ Mos 05/02/2015  . Influenza,inj,quad, With Preservative 05/10/2014  . Pneumococcal Conjugate-13 08/29/2013  . Tdap 02/03/2011  . Zoster Recombinat (Shingrix) 01/03/2017, 04/11/2017   There are no preventive care reminders to display for this patient.  Diet Eat 3 meals a day. Drinks mostly water. Has juice with breakfast. Rarely drinks soda or tea.   Exercise Current Exercise Habits: Home exercise routine, Type of exercise: walking, Time (Minutes): 45, Frequency (Times/Week): 3, Weekly Exercise (Minutes/Week): 135, Intensity: Mild, Exercise limited by: None identified   Depression Screen PHQ 2/9 Scores 01/10/2018 12/28/2017 09/21/2017 08/24/2017 05/10/2017 04/11/2017 01/03/2017  PHQ - 2 Score 0 0 0 0 0 0 0     Fall Risk Fall Risk  01/10/2018 12/28/2017 09/21/2017 08/24/2017 05/10/2017  Falls in the past year? No No No No No  Number falls in past yr: - - - - -  Injury with Fall? - - - - -    Safety Is the patient's home free of loose throw rugs in walkways, pet beds, electrical cords, etc?   yes      Grab bars in the bathroom? yes      Walkin shower? yes      Shower Seat? no      Handrails on the stairs?   yes      Adequate lighting?   yes  Patient Care Team: Chipper Herb, MD as PCP - General (Family Medicine) Gaynelle Arabian, MD as Consulting Physician (Orthopedic Surgery) Suella Broad, MD as Consulting Physician (Physical Medicine and Rehabilitation) Druscilla Brownie, MD as Consulting Physician (Dermatology) Dyke Maes, OD as Consulting Physician (Optometry) Inda Castle, MD (Inactive) as Consulting Physician (Gastroenterology)   No hospitalizations, ER visits, or surgeries this past year.    Objective:    Today's Vitals   01/10/18 1036  BP: 124/71  Pulse: 80  Weight: 143 lb (64.9 kg)  Height: 5\' 2"  (1.575 m)   Body mass index is 26.16 kg/m.  Advanced Directives 01/10/2018 01/03/2017 12/12/2015  Does Patient Have a Medical Advance Directive? Yes Yes Yes  Type of Paramedic of [place];Living will [place];Living will [place];Living will  Does patient want to make changes to medical advance directive? - - No - Patient declined  Copy of [place] in Chart? No - copy requested No - copy requested No - copy requested    Hearing/Vision  normal or No deficits noted during visit.  Cognitive Function: MMSE - Mini Mental State Exam 01/10/2018 01/03/2017 12/12/2015  Orientation to time 5 5 5   Orientation to Place 5 5 5   Registration 3 3 3   Attention/ Calculation 5 5 5   Recall 3 3 3   Language- name 2 objects 2 2 2   Language- repeat 1 1 1   Language- follow 3 step command 3 3 3   Language- read & follow direction 1 1 1   Write a sentence 1 1 1   Copy design 1 1 1   Total score 30 30 30        Normal Cognitive Function Screening: Yes      Assessment:   This is a routine wellness examination for [place].    Plan:    Goals    . Exercise 150 min/wk Moderate Activity       Keep f/u with Chipper Herb, MD and any other specialty appointments you may have Continue current medications Move carefully to avoid falls. Use assistive devices like a can or walker if needed. Aim for at least 150 minutes of moderate activity a week. This can be done with chair exercises if necessary. Read or work on puzzles daily Stay connected with friends and family  Review and return a signed, witnessed, and notarized  copy of Advance Directives if given.  Health Maintenance: Tdap Vaccine recommended: no- up to date Zostavax (Shingles vaccine) recommended:no-up to date Prevnar or Pneumovax (pneumonia  vaccines) recommended:no-up to date  Cancer Screenings: Lung: Low Dose CT Chest recommended if Age 21-80 years, 30 pack-year currently smoking OR have quit w/in 15years. Patient does not qualify. Colon cancer screening recommended: No-up to date Mammogram recommended:no-up to date Pap Smear recommended: not applicable  Additional Screenings Hepatitis C Screening recommended: no Dexa Scan recommended: no Diabetic Eye Exam recommended: no  No orders of the defined types were placed in this encounter.   I have personally reviewed and noted the following in the patient's chart:   . Medical and social history . Use of alcohol, tobacco or illicit drugs  . Current medications and supplements . Functional ability and status . Nutritional status . Physical activity . Advanced directives . List of other physicians . Hospitalizations, surgeries, and ER visits in previous 12 months . Vitals . Screenings to include cognitive, depression, and falls . Referrals and appointments  In addition, I have reviewed and discussed with patient certain preventive protocols, quality metrics, and best practice recommendations. A written personalized care plan for preventive services as well as general preventive health recommendations were provided to patient.     Marissa Sicilian, RN   01/10/2018  I have reviewed and agree with the above AWV documentation.   Mary-Margaret Hassell Done, FNP

## 2018-03-01 ENCOUNTER — Encounter: Payer: Self-pay | Admitting: Family Medicine

## 2018-03-06 ENCOUNTER — Encounter: Payer: Self-pay | Admitting: Family Medicine

## 2018-03-06 ENCOUNTER — Ambulatory Visit (INDEPENDENT_AMBULATORY_CARE_PROVIDER_SITE_OTHER): Payer: Medicare Other

## 2018-03-06 ENCOUNTER — Ambulatory Visit: Payer: Medicare Other | Admitting: Family Medicine

## 2018-03-06 VITALS — BP 133/80 | HR 83 | Temp 97.9°F | Ht 62.0 in | Wt 144.0 lb

## 2018-03-06 DIAGNOSIS — M25552 Pain in left hip: Secondary | ICD-10-CM

## 2018-03-06 DIAGNOSIS — G8929 Other chronic pain: Secondary | ICD-10-CM

## 2018-03-06 NOTE — Patient Instructions (Signed)

## 2018-03-06 NOTE — Progress Notes (Signed)
Subjective: CC: left side pain PCP: Chipper Herb, MD Marissa Perez is a 72 y.o. female presenting to clinic today for:  1. Side pain Patient reports a long-standing history of left-sided pain.  She points to the left groin as a source of pain.  She describes it as a achy/crampy pain.  She notes radiation to the back.  She has chronic left-sided sciatica/low back pain.  She sees a specialist for this.  She is status post spine injection about 2 weeks ago and has follow-up on Friday.  She does note occasional left lower extremity weakness and skin sensitivity along the lateral aspect of the left thigh.  She is currently treated with gabapentin and meloxicam as needed.  She notes occasional constipation and takes a probiotic for this.  Bowel movement was normal today.   ROS: Per HPI  Allergies  Allergen Reactions  . Codeine     Headache    . Femring [Estradiol] Rash  . Cephalexin Rash  . Erythromycin Other (See Comments)    Gi  Upset    . Penicillins Rash   Past Medical History:  Diagnosis Date  . Acid reflux   . Allergy   . Cancer ([place])    SKIN  . Diverticulosis   . History of ETT 7/10   abnormal   . History of stomach ulcers 2008   positive h. pylori  . Hyperlipidemia   . Hypertension   . Hypothyroidism   . IBS (irritable bowel syndrome)     Current Outpatient Medications:  .  atorvastatin (LIPITOR) 20 MG tablet, Take 1 tablet (20 mg total) by mouth daily., Disp: 90 tablet, Rfl: 3 .  BIOTIN PO, Take by mouth.  , Disp: , Rfl:  .  Calcium Citrate-Vitamin D (CALCIUM CITRATE + D3 MAXIMUM) 315-250 MG-UNIT TABS, Take 1 tablet by mouth 2 (two) times daily., Disp: 120 tablet, Rfl:  .  Cholecalciferol (VITAMIN D PO), Take 5,000 Units by mouth daily., Disp: , Rfl:  .  clobetasol (TEMOVATE) 0.05 % external solution, Apply 1 application topically daily as needed. , Disp: , Rfl:  .  Cyanocobalamin (B-12) 1000 MCG SUBL, Place under the tongue., Disp: , Rfl:  .  ezetimibe  (ZETIA) 10 MG tablet, Take 1 tablet (10 mg total) by mouth daily., Disp: 90 tablet, Rfl: 3 .  gabapentin (NEURONTIN) 100 MG capsule, Take 2 capsules at bedtime, Disp: 180 capsule, Rfl: 3 .  hydrochlorothiazide (MICROZIDE) 12.5 MG capsule, TAKE (1) CAPSULE DAILY, Disp: 90 capsule, Rfl: 3 .  levothyroxine (SYNTHROID, LEVOTHROID) 50 MCG tablet, TAKE 1/2 TAB ON MON, WED, FRI & 1 TAB ON TUES AND THURS, Disp: 90 tablet, Rfl: 3 .  meloxicam (MOBIC) 15 MG tablet, Take 1 tablet (15 mg total) by mouth daily., Disp: 90 tablet, Rfl: 3 .  Multiple Vitamin (MULTI-VITAMIN PO), Take by mouth daily.  , Disp: , Rfl:  .  omeprazole (PRILOSEC) 20 MG capsule, Take 20 mg by mouth daily., Disp: , Rfl:  .  OVER THE COUNTER MEDICATION, Take 4 capsules by mouth daily. JR Carlson's Elite Omega Fish oil, Disp: , Rfl:  .  Probiotic Product (PROBIOTIC DAILY PO), Take by mouth., Disp: , Rfl:  .  valsartan (DIOVAN) 320 MG tablet, Take 1 tablet (320 mg total) by mouth daily. As directed, Disp: 90 tablet, Rfl: 3 Social History   Socioeconomic History  . Marital status: Married    Spouse name: Not on file  . Number of children: 1  .  Years of education: 70  . Highest education level: Bachelor's degree (e.g., BA, AB, BS)  Occupational History  . 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 Financial resource strain: Not hard at all  . Food insecurity:    Worry: Never true    Inability: Never true  . Transportation needs:    Medical: No    Non-medical: No  Tobacco Use  . Smoking status: Never Smoker  . Smokeless tobacco: Never Used  Substance and Sexual Activity  . Alcohol use: Yes    Alcohol/week: 8.4 oz    Types: 14 Glasses of wine per week  . Drug use: No  . Sexual activity: Never  Lifestyle  . Physical activity:    Days per week: 3 days    Minutes per session: 60 min  . Stress: Only a little  Relationships  . Social connections:    Talks on phone: More than  three times a week    Gets together: More than three times a week    Attends religious service: More than 4 times per year    Active member of club or organization: Yes    Attends meetings of clubs or organizations: More than 4 times per year    Relationship status: Married  . Intimate partner violence:    Fear of current or ex partner: No    Emotionally abused: No    Physically abused: No    Forced sexual activity: No  Other Topics Concern  . Not on file  Social History Narrative  . Not on file   Family History  Problem Relation Age of Onset  . Diabetes Father   . Heart disease Father   . Heart attack Father 14  . Breast cancer Mother   . Breast cancer Paternal Grandmother        Aunt also   . Breast cancer Unknown        Family History  . Breast cancer Maternal Aunt   . Breast cancer Paternal Aunt   . Colon cancer Neg Hx     Objective: Office vital signs reviewed. BP 133/80   Pulse 83   Temp 97.9 F (36.6 C) (Oral)   Ht 5\' 2"  (1.575 m)   Wt 144 lb (65.3 kg)   BMI 26.34 kg/m   Physical Examination:  General: Awake, alert, well nourished, No acute distress Extremities: warm, well perfused, No edema, cyanosis or clubbing; +2 pulses bilaterally MSK: normal gait and normal station  Lumbar spine: Scoliotic curve noted between the thoracic and lumbar spine.  No midline tenderness to palpation.  No paraspinal muscle tenderness palpation.  She has increased muscle tone on the right.  Left hip: Patient has full active range of motion.  She does have pain with FADIR.  FABER negative.  Right hip: Patient has full active range of motion.  Minimal pain with FADIR.  FABER negative. Skin: dry; intact; no rashes or lesions Neuro: 4/5 strength of hip flexors bilaterally.  Otherwise, 5/5 in all planes. Light touch sensation grossly intact  No results found.  Assessment/ Plan: 72 y.o. female   1. Chronic left hip pain Given positive FADIR, x-ray was obtained to further  evaluate.  Personal review does demonstrate some degenerative changes within the hip.  Awaiting formal review by radiologist.  I recommended the patient continue the meloxicam if needed for pain.  Use PPI while taking.  We discussed reasons for return and urgent  evaluation.  We will also plan to CC x-ray result to orthopedist once available.  Could consider hip injections if clinically appropriate. - DG HIP UNILAT W OR W/O PELVIS 2-3 VIEWS LEFT; Future   Orders Placed This Encounter  Procedures  . DG HIP UNILAT W OR W/O PELVIS 2-3 VIEWS LEFT    Standing Status:   Future    Number of Occurrences:   1    Standing Expiration Date:   05/05/2019    Order Specific Question:   Reason for Exam (SYMPTOM  OR DIAGNOSIS REQUIRED)    Answer:   left hip pain.  +FADIR    Order Specific Question:   Preferred imaging location?    Answer:   Internal      [name], [place] [phone]

## 2018-05-03 ENCOUNTER — Other Ambulatory Visit: Payer: Medicare Other

## 2018-05-03 DIAGNOSIS — K219 Gastro-esophageal reflux disease without esophagitis: Secondary | ICD-10-CM

## 2018-05-03 DIAGNOSIS — I10 Essential (primary) hypertension: Secondary | ICD-10-CM

## 2018-05-03 DIAGNOSIS — Z8249 Family history of ischemic heart disease and other diseases of the circulatory system: Secondary | ICD-10-CM

## 2018-05-03 DIAGNOSIS — E7800 Pure hypercholesterolemia, unspecified: Secondary | ICD-10-CM

## 2018-05-03 DIAGNOSIS — E559 Vitamin D deficiency, unspecified: Secondary | ICD-10-CM

## 2018-05-04 LAB — BMP8+EGFR
BUN/Creatinine Ratio: 18 (ref 12–28)
BUN: 12 mg/dL (ref 8–27)
CHLORIDE: 95 mmol/L — AB (ref 96–106)
CO2: 25 mmol/L (ref 20–29)
Calcium: 9.9 mg/dL (ref 8.7–10.3)
Creatinine, Ser: 0.67 mg/dL (ref 0.57–1.00)
GFR calc Af Amer: 102 mL/min/{1.73_m2} (ref >59)
GFR, EST NON AFRICAN AMERICAN: 88 mL/min/{1.73_m2} (ref >59)
Glucose: 92 mg/dL (ref 65–99)
POTASSIUM: 4.8 mmol/L (ref 3.5–5.2)
Sodium: 136 mmol/L (ref 134–144)

## 2018-05-04 LAB — LIPID PANEL
Chol/HDL Ratio: 2.5 ratio (ref 0.0–4.4)
Cholesterol, Total: 152 mg/dL (ref 100–199)
HDL: 60 mg/dL (ref >39)
LDL Calculated: 61 mg/dL (ref 0–99)
TRIGLYCERIDES: 156 mg/dL — AB (ref 0–149)
VLDL CHOLESTEROL CAL: 31 mg/dL (ref 5–40)

## 2018-05-04 LAB — CBC WITH DIFFERENTIAL/PLATELET
BASOS: 1 %
Basophils Absolute: 0.1 10*3/uL (ref 0.0–0.2)
EOS (ABSOLUTE): 0.1 10*3/uL (ref 0.0–0.4)
EOS: 2 %
HEMATOCRIT: 36.3 % (ref 34.0–46.6)
Hemoglobin: 12.2 g/dL (ref 11.1–15.9)
IMMATURE GRANS (ABS): 0 10*3/uL (ref 0.0–0.1)
Immature Granulocytes: 0 %
Lymphocytes Absolute: 1.6 10*3/uL (ref 0.7–3.1)
Lymphs: 34 %
MCH: 31.1 pg (ref 26.6–33.0)
MCHC: 33.6 g/dL (ref 31.5–35.7)
MCV: 93 fL (ref 79–97)
MONOS ABS: 0.5 10*3/uL (ref 0.1–0.9)
Monocytes: 10 %
NEUTROS PCT: 53 %
Neutrophils Absolute: 2.5 10*3/uL (ref 1.4–7.0)
PLATELETS: 295 10*3/uL (ref 150–450)
RBC: 3.92 x10E6/uL (ref 3.77–5.28)
RDW: 12.3 % (ref 12.3–15.4)
WBC: 4.7 10*3/uL (ref 3.4–10.8)

## 2018-05-04 LAB — HEPATIC FUNCTION PANEL
ALT: 40 IU/L — AB (ref 0–32)
AST: 30 IU/L (ref 0–40)
Albumin: 5 g/dL — ABNORMAL HIGH (ref 3.5–4.8)
Alkaline Phosphatase: 65 IU/L (ref 39–117)
BILIRUBIN TOTAL: 0.4 mg/dL (ref 0.0–1.2)
BILIRUBIN, DIRECT: 0.13 mg/dL (ref 0.00–0.40)
Total Protein: 7.1 g/dL (ref 6.0–8.5)

## 2018-05-04 LAB — VITAMIN D 25 HYDROXY (VIT D DEFICIENCY, FRACTURES): Vit D, 25-Hydroxy: 48.4 ng/mL (ref 30.0–100.0)

## 2018-05-08 ENCOUNTER — Ambulatory Visit: Payer: Medicare Other | Admitting: Family Medicine

## 2018-05-08 ENCOUNTER — Encounter: Payer: Self-pay | Admitting: Family Medicine

## 2018-05-08 VITALS — BP 114/68 | HR 83 | Temp 97.0°F | Ht 62.0 in | Wt 144.0 lb

## 2018-05-08 DIAGNOSIS — E559 Vitamin D deficiency, unspecified: Secondary | ICD-10-CM | POA: Diagnosis not present

## 2018-05-08 DIAGNOSIS — Z23 Encounter for immunization: Secondary | ICD-10-CM

## 2018-05-08 DIAGNOSIS — K219 Gastro-esophageal reflux disease without esophagitis: Secondary | ICD-10-CM | POA: Diagnosis not present

## 2018-05-08 DIAGNOSIS — L989 Disorder of the skin and subcutaneous tissue, unspecified: Secondary | ICD-10-CM

## 2018-05-08 DIAGNOSIS — E7800 Pure hypercholesterolemia, unspecified: Secondary | ICD-10-CM | POA: Diagnosis not present

## 2018-05-08 DIAGNOSIS — I10 Essential (primary) hypertension: Secondary | ICD-10-CM

## 2018-05-08 DIAGNOSIS — Z8249 Family history of ischemic heart disease and other diseases of the circulatory system: Secondary | ICD-10-CM

## 2018-05-08 NOTE — Progress Notes (Signed)
Subjective:    Patient ID: Marissa Perez, female    DOB: 02/12/1946, 72 y.o.   MRN: [id number]  HPI Pt here for follow up and management of chronic medical problems which includes hypertension and hyperlipidemia. She is taking medication regularly.  The patient is doing well today with no specific complaints other than increased gas issues.  She will get her flu shot today.  Her vital signs are stable.  She has had lab work done and this will be reviewed with her during the visit today.  The vitamin D level was excellent at 48.4.  The CBC was entirely within normal limits and basically unchanged from the previous CBC.  The blood sugar creatinine were all good including the potassium other than the chloride was slightly decreased.  Cholesterol numbers with traditional lipid testing had an LDL-C cholesterol is excellent at 61.  Triglycerides were slightly elevated at 156 and the good cholesterol remains good at 60.  The albumin was slightly elevated at all other liver function test except for the ALT were normal ALT was slightly elevated and this is been the case in the past.  Patient continues to have problems with her back and both she and her husband are seeing the orthopedist that come to our office and in [place].  She does complain of more gas and is trying to drink lactose-free milk and does not eat a lot of dairy products.  She is up-to-date on her colonoscopies.  She denies any blood in the stool or black tarry bowel movements.  She has a lot of belching and says this is very similar to what her father used to have.  She denies any chest pain pressure tightness or shortness of breath.  Unfortunately because of the back pain she has not been doing as much walking.  She is passing her water without problems.  She did have a random fall in her house going down some steps and hit her head and this was near the Labor Day weekend did not have any problems as a result of that but has had a history of  falls and fractures.    Patient Active Problem List   Diagnosis Date Noted  . Lumbar spondylosis 12/28/2017  . Degeneration of lumbar intervertebral disc 12/13/2017  . Aortic atherosclerosis ([place]) 09/22/2017  . Family history of breast cancer 07/05/2016  . Skin cancer 07/05/2016  . Vitamin D deficiency 07/05/2016  . Hypothyroidism 12/11/2012  . Hyperlipidemia 12/11/2012  . Hypertension 12/11/2012  . GERD (gastroesophageal reflux disease) 12/11/2012   Outpatient Encounter Medications as of 05/08/2018  Medication Sig  . atorvastatin (LIPITOR) 20 MG tablet Take 1 tablet (20 mg total) by mouth daily.  [name] Kitchen BIOTIN PO Take by mouth.    . Calcium Citrate-Vitamin D (CALCIUM CITRATE + D3 MAXIMUM) 315-250 MG-UNIT TABS Take 1 tablet by mouth 2 (two) times daily.  . Cholecalciferol (VITAMIN D PO) Take 5,000 Units by mouth daily.  . clobetasol (TEMOVATE) 0.05 % external solution Apply 1 application topically daily as needed.   . Cyanocobalamin (B-12) 1000 MCG SUBL Place under the tongue.  . ezetimibe (ZETIA) 10 MG tablet Take 1 tablet (10 mg total) by mouth daily.  [name] Kitchen gabapentin (NEURONTIN) 100 MG capsule Take 2 capsules at bedtime  . hydrochlorothiazide (MICROZIDE) 12.5 MG capsule TAKE (1) CAPSULE DAILY  . levothyroxine (SYNTHROID, LEVOTHROID) 50 MCG tablet TAKE 1/2 TAB ON MON, WED, FRI & 1 TAB ON TUES AND THURS  . meloxicam (MOBIC) 15 MG  tablet Take 1 tablet (15 mg total) by mouth daily.  . Multiple Vitamin (MULTI-VITAMIN PO) Take by mouth daily.    [name] Kitchen omeprazole (PRILOSEC) 20 MG capsule Take 20 mg by mouth daily.  [name] Kitchen OVER THE COUNTER MEDICATION Take 4 capsules by mouth daily. JR Carlson's Elite Omega Fish oil  . Probiotic Product (PROBIOTIC DAILY PO) Take by mouth.  . valsartan (DIOVAN) 320 MG tablet Take 1 tablet (320 mg total) by mouth daily. As directed   No facility-administered encounter medications on file as of 05/08/2018.       Review of Systems  Constitutional: Negative.   HENT:  Negative.   Eyes: Negative.   Respiratory: Negative.   Cardiovascular: Negative.   Gastrointestinal:       Increased gas   Endocrine: Negative.   Genitourinary: Negative.   Musculoskeletal: Negative.   Skin: Negative.   Allergic/Immunologic: Negative.   Neurological: Negative.   Hematological: Negative.   Psychiatric/Behavioral: Negative.        Objective:   Physical Exam  Constitutional: She is oriented to person, place, and time. She appears well-developed and well-nourished.  The patient is pleasant and alert and continues to see the orthopedic surgeon.  HENT:  Head: Normocephalic and atraumatic.  Right Ear: External ear normal.  Left Ear: External ear normal.  Nose: Nose normal.  Mouth/Throat: Oropharynx is clear and moist.  Eyes: Pupils are equal, round, and reactive to light. Conjunctivae and EOM are normal. Right eye exhibits no discharge. Left eye exhibits no discharge. No scleral icterus.  Up-to-date on eye exams  Neck: Normal range of motion. Neck supple. No thyromegaly present.  No bruits thyromegaly or anterior cervical adenopathy  Cardiovascular: Normal rate, regular rhythm, normal heart sounds and intact distal pulses.  No murmur heard. Heart is regular at 72/min without murmurs or gallops.  She has good pedal pulses.  Pulmonary/Chest: Effort normal and breath sounds normal. She has no wheezes. She has no rales.  Lungs are clear anteriorly and posteriorly  Abdominal: Soft. Bowel sounds are normal. She exhibits no distension and no mass. There is no tenderness.  No liver or spleen enlargement.  No epigastric tenderness.  No suprapubic tenderness no masses no bruits.  Active bowel sounds.  No significant distention.  Musculoskeletal: Normal range of motion. She exhibits no edema.  Lymphadenopathy:    She has no cervical adenopathy.  Neurological: She is alert and oriented to person, place, and time. She has normal reflexes. No cranial nerve deficit.  Reflexes are  2+ and equal bilaterally  Skin: Skin is warm and dry. No rash noted.  Patient is being followed closely for a skin lesion on her right occipital scalp.  Psychiatric: She has a normal mood and affect. Her behavior is normal. Judgment and thought content normal.  Mood affect and behavior are all normal for this patient.  Nursing note and vitals reviewed.  BP 114/68 (BP Location: Left Arm)   Pulse 83   Temp (!) 97 F (36.1 C) (Oral)   Ht 5\' 2"  (1.575 m)   Wt 144 lb (65.3 kg)   BMI 26.34 kg/m         Assessment & Plan:  1. Pure hypercholesterolemia -All cholesterol numbers are good except triglycerides were elevated.  The patient indicates she has not been walking as much because of ongoing joint pain.  She understands that more walking would be helpful and to continue with as aggressive therapeutic lifestyle changes as possible with her diet.  2. Vitamin D  deficiency -Continue with vitamin D replacement at least 600 mg twice daily  3. Essential hypertension -Blood pressure is good she will continue with current treatment  4. Gastroesophageal reflux disease, esophagitis presence not specified -She will try Pepcid over-the-counter for couple weeks and see if this has any help with the bloating.  She does not chew gum.  She has been more anxious and may be doing some unconscious air swallowing.  She will continue to watch milk and dairy products closely.  5. Family history of heart disease -No chest pain today and patient will continue with as aggressive therapeutic lifestyle changes as possible  6. Scalp lesion -Follow-up with dermatology as planned  Patient Instructions                       Medicare Annual Wellness Visit  [facility] and the medical providers at [facility] strive to bring you the best medical care.  In doing so we not only want to address your current medical conditions and concerns but also to detect new conditions early and prevent  illness, disease and health-related problems.    Medicare offers a yearly Wellness Visit which allows our clinical staff to assess your need for preventative services including immunizations, lifestyle education, counseling to decrease risk of preventable diseases and screening for fall risk and other medical concerns.    This visit is provided free of charge (no copay) for all Medicare recipients. The clinical pharmacists at [facility] have begun to conduct these Wellness Visits which will also include a thorough review of all your medications.    As you primary medical provider recommend that you make an appointment for your Annual Wellness Visit if you have not done so already this year.  You may set up this appointment before you leave today or you may call back ([phone]) and schedule an appointment.  Please make sure when you call that you mention that you are scheduling your Annual Wellness Visit with the clinical pharmacist so that the appointment may be made for the proper length of time.     Continue current medications. Continue good therapeutic lifestyle changes which include good diet and exercise. Fall precautions discussed with patient. If an FOBT was given today- please return it to our front desk. If you are over 49 years old - you may need Prevnar 16 or the adult Pneumonia vaccine.  **Flu shots are available--- please call and schedule a FLU-CLINIC appointment**  After your visit with [place] today you will receive a survey in the mail or online from [employer] regarding your care with [place]. Please take a moment to fill this out. Your feedback is very important to [place] as you can help [place] better understand your patient needs as well as improve your experience and satisfaction. WE CARE ABOUT YOU!!!   Drink plenty of fluids and stay well-hydrated especially with the upcoming winter months ahead. Keep the house as cool as possible and use humidification in the  home. Continue to be careful and do not put yourself at risk for falling Walk and exercise regularly on a flat surface Do not use overhead fans Follow-up with orthopedics as planned.  Arrie Senate MD

## 2018-05-08 NOTE — Patient Instructions (Addendum)
Medicare Annual Wellness Visit  [facility] and the medical providers at [facility] strive to bring you the best medical care.  In doing so we not only want to address your current medical conditions and concerns but also to detect new conditions early and prevent illness, disease and health-related problems.    Medicare offers a yearly Wellness Visit which allows our clinical staff to assess your need for preventative services including immunizations, lifestyle education, counseling to decrease risk of preventable diseases and screening for fall risk and other medical concerns.    This visit is provided free of charge (no copay) for all Medicare recipients. The clinical pharmacists at [facility] have begun to conduct these Wellness Visits which will also include a thorough review of all your medications.    As you primary medical provider recommend that you make an appointment for your Annual Wellness Visit if you have not done so already this year.  You may set up this appointment before you leave today or you may call back ([phone]) and schedule an appointment.  Please make sure when you call that you mention that you are scheduling your Annual Wellness Visit with the clinical pharmacist so that the appointment may be made for the proper length of time.     Continue current medications. Continue good therapeutic lifestyle changes which include good diet and exercise. Fall precautions discussed with patient. If an FOBT was given today- please return it to our front desk. If you are over 1 years old - you may need Prevnar 90 or the adult Pneumonia vaccine.  **Flu shots are available--- please call and schedule a FLU-CLINIC appointment**  After your visit with [place] today you will receive a survey in the mail or online from [employer] regarding your care with [place]. Please take a moment to fill this out. Your feedback is very  important to [place] as you can help [place] better understand your patient needs as well as improve your experience and satisfaction. WE CARE ABOUT YOU!!!   Drink plenty of fluids and stay well-hydrated especially with the upcoming winter months ahead. Keep the house as cool as possible and use humidification in the home. Continue to be careful and do not put yourself at risk for falling Walk and exercise regularly on a flat surface Do not use overhead fans Follow-up with orthopedics as planned.

## 2018-06-07 ENCOUNTER — Ambulatory Visit (HOSPITAL_COMMUNITY)
Admission: RE | Admit: 2018-06-07 | Discharge: 2018-06-07 | Disposition: A | Discharge status: Home / Self Care | Payer: Medicare Other | Source: Ambulatory Visit | Attending: Physician Assistant | Admitting: Physician Assistant

## 2018-06-07 ENCOUNTER — Ambulatory Visit: Payer: Medicare Other | Admitting: Physician Assistant

## 2018-06-07 DIAGNOSIS — R51 Headache: Secondary | ICD-10-CM | POA: Diagnosis present

## 2018-06-07 DIAGNOSIS — X58XXXA Exposure to other specified factors, initial encounter: Secondary | ICD-10-CM | POA: Insufficient documentation

## 2018-06-07 DIAGNOSIS — R519 Headache, unspecified: Secondary | ICD-10-CM

## 2018-06-07 DIAGNOSIS — S0003XA Contusion of scalp, initial encounter: Secondary | ICD-10-CM | POA: Insufficient documentation

## 2018-06-07 MED ORDER — PREDNISONE 10 MG (21) PO TBPK: ORAL_TABLET | ORAL | 0 refills | Status: DC

## 2018-06-08 ENCOUNTER — Encounter: Payer: Self-pay | Admitting: Physician Assistant

## 2018-06-08 NOTE — Progress Notes (Signed)
BP (!) 145/78   Pulse 71   Temp (!) 97.4 F (36.3 C) (Oral)    Subjective:    Patient ID: Marissa Perez, female    DOB: [birth year], 72 y.o.   MRN: [id number]  HPI: Marissa Perez is a 72 y.o. female presenting on 06/07/2018 for Head Laceration (injured in fall)   This patient was at home when she fell today and hit the back of her head creating a large swollen area with a slight scratch on it.  She also injured her right elbow in the fall.  She states that when she fell she did see stars and she does have known degenerative disc disease throughout her spine.  She does have some tingling down both arms all the way to her fingertips.  There was no loss of consciousness.  She states that when she fell she did see some stars but that they have gone away.  She of course had some headache at this point but not a severe one.  Past Medical History:  Diagnosis Date  . Acid reflux   . Allergy   . Cancer ([place])    SKIN  . Diverticulosis   . History of ETT 7/10   abnormal   . History of stomach ulcers 2008   positive h. pylori  . Hyperlipidemia   . Hypertension   . Hypothyroidism   . IBS (irritable bowel syndrome)    Relevant past medical, surgical, family and social history reviewed and updated as indicated. Interim medical history since our last visit reviewed. Allergies and medications reviewed and updated. DATA REVIEWED: CHART IN EPIC  Family History reviewed for pertinent findings.  Review of Systems  Constitutional: Negative.  Negative for activity change, fatigue and fever.  HENT: Negative.   Eyes: Negative.   Respiratory: Negative.  Negative for cough.   Cardiovascular: Negative.  Negative for chest pain.  Gastrointestinal: Negative.  Negative for abdominal pain.  Endocrine: Negative.   Genitourinary: Negative.  Negative for dysuria.  Musculoskeletal: Negative.   Skin: Positive for color change and wound.  Neurological: Positive for numbness and headaches. Negative  for dizziness, speech difficulty and weakness.    Allergies as of 06/07/2018      Reactions   Codeine    Headache    Femring [estradiol] Rash   Cephalexin Rash   Erythromycin Other (See Comments)   Gi  Upset    Penicillins Rash      Medication List        Accurate as of 06/07/18 11:59 PM. Always use your most recent med list.          atorvastatin 20 MG tablet Commonly known as:  LIPITOR Take 1 tablet (20 mg total) by mouth daily.   B-12 1000 MCG Subl Place under the tongue.   BIOTIN PO Take by mouth.   Calcium Citrate-Vitamin D 315-250 MG-UNIT Tabs Take 1 tablet by mouth 2 (two) times daily.   clobetasol 0.05 % external solution Commonly known as:  TEMOVATE Apply 1 application topically daily as needed.   ezetimibe 10 MG tablet Commonly known as:  ZETIA Take 1 tablet (10 mg total) by mouth daily.   gabapentin 100 MG capsule Commonly known as:  NEURONTIN Take 2 capsules at bedtime   hydrochlorothiazide 12.5 MG capsule Commonly known as:  MICROZIDE TAKE (1) CAPSULE DAILY   levothyroxine 50 MCG tablet Commonly known as:  SYNTHROID, LEVOTHROID TAKE 1/2 TAB ON MON, WED, FRI & 1 TAB ON  TUES AND THURS   meloxicam 15 MG tablet Commonly known as:  MOBIC Take 1 tablet (15 mg total) by mouth daily.   MULTI-VITAMIN PO Take by mouth daily.   omeprazole 20 MG capsule Commonly known as:  PRILOSEC Take 20 mg by mouth daily.   OVER THE COUNTER MEDICATION Take 4 capsules by mouth daily. JR Carlson's Elite Omega Fish oil   predniSONE 10 MG (21) Tbpk tablet Commonly known as:  STERAPRED UNI-PAK 21 TAB As directed x 6 days   PROBIOTIC DAILY PO Take by mouth.   valsartan 320 MG tablet Commonly known as:  DIOVAN Take 1 tablet (320 mg total) by mouth daily. As directed   VITAMIN D PO Take 5,000 Units by mouth daily.          Objective:    BP (!) 145/78   Pulse 71   Temp (!) 97.4 F (36.3 C) (Oral)   Allergies  Allergen Reactions  . Codeine      Headache    . Femring [Estradiol] Rash  . Cephalexin Rash  . Erythromycin Other (See Comments)    Gi  Upset    . Penicillins Rash    Wt Readings from Last 3 Encounters:  05/08/18 144 lb (65.3 kg)  03/06/18 144 lb (65.3 kg)  01/10/18 143 lb (64.9 kg)    Physical Exam  Constitutional: She is oriented to person, place, and time. She appears well-developed and well-nourished.  HENT:  Head: Normocephalic and atraumatic.  Right Ear: Tympanic membrane, external ear and ear canal normal.  Left Ear: Tympanic membrane, external ear and ear canal normal.  Nose: Nose normal. No rhinorrhea.  Mouth/Throat: Oropharynx is clear and moist and mucous membranes are normal. No oropharyngeal exudate or posterior oropharyngeal erythema.  Eyes: Pupils are equal, round, and reactive to light. Conjunctivae and EOM are normal.  Neck: Normal range of motion. Neck supple.  Cardiovascular: Normal rate, regular rhythm, normal heart sounds and intact distal pulses.  Pulmonary/Chest: Effort normal and breath sounds normal.  Abdominal: Soft. Bowel sounds are normal.  Neurological: She is alert and oriented to person, place, and time. She has normal reflexes.  Skin: Skin is warm and dry. Abrasion noted. No rash noted. There is erythema.     Swelling of the occipital area  Psychiatric: She has a normal mood and affect. Her behavior is normal. Judgment and thought content normal.    Results for orders placed or performed in visit on 05/03/18  CBC with Differential/Platelet  Result Value Ref Range   WBC 4.7 3.4 - 10.8 x10E3/uL   RBC 3.92 3.77 - 5.28 x10E6/uL   Hemoglobin 12.2 11.1 - 15.9 g/dL   Hematocrit 36.3 34.0 - 46.6 %   MCV 93 79 - 97 fL   MCH 31.1 26.6 - 33.0 pg   MCHC 33.6 31.5 - 35.7 g/dL   RDW 12.3 12.3 - 15.4 %   Platelets 295 150 - 450 x10E3/uL   Neutrophils 53 Not Estab. %   Lymphs 34 Not Estab. %   Monocytes 10 Not Estab. %   Eos 2 Not Estab. %   Basos 1 Not Estab. %   Neutrophils  Absolute 2.5 1.4 - 7.0 x10E3/uL   Lymphocytes Absolute 1.6 0.7 - 3.1 x10E3/uL   Monocytes Absolute 0.5 0.1 - 0.9 x10E3/uL   EOS (ABSOLUTE) 0.1 0.0 - 0.4 x10E3/uL   Basophils Absolute 0.1 0.0 - 0.2 x10E3/uL   Immature Granulocytes 0 Not Estab. %   Immature Grans (Abs) 0.0  0.0 - 0.1 x10E3/uL  BMP8+EGFR  Result Value Ref Range   Glucose 92 65 - 99 mg/dL   BUN 12 8 - 27 mg/dL   Creatinine, Ser 0.67 0.57 - 1.00 mg/dL   GFR calc non Af Amer 88 >59 mL/min/1.73   GFR calc Af Amer 102 >59 mL/min/1.73   BUN/Creatinine Ratio 18 12 - 28   Sodium 136 134 - 144 mmol/L   Potassium 4.8 3.5 - 5.2 mmol/L   Chloride 95 (L) 96 - 106 mmol/L   CO2 25 20 - 29 mmol/L   Calcium 9.9 8.7 - 10.3 mg/dL  Lipid panel  Result Value Ref Range   Cholesterol, Total 152 100 - 199 mg/dL   Triglycerides 156 (H) 0 - 149 mg/dL   HDL 60 >39 mg/dL   VLDL Cholesterol Cal 31 5 - 40 mg/dL   LDL Calculated 61 0 - 99 mg/dL   Chol/HDL Ratio 2.5 0.0 - 4.4 ratio  VITAMIN D 25 Hydroxy (Vit-D Deficiency, Fractures)  Result Value Ref Range   Vit D, 25-Hydroxy 48.4 30.0 - 100.0 ng/mL  Hepatic function panel  Result Value Ref Range   Total Protein 7.1 6.0 - 8.5 g/dL   Albumin 5.0 (H) 3.5 - 4.8 g/dL   Bilirubin Total 0.4 0.0 - 1.2 mg/dL   Bilirubin, Direct 0.13 0.00 - 0.40 mg/dL   Alkaline Phosphatase 65 39 - 117 IU/L   AST 30 0 - 40 IU/L   ALT 40 (H) 0 - 32 IU/L      Assessment & Plan:   1. Acute nonintractable headache, unspecified headache type Rest ice - CT Head Wo Contrast; Future   Continue all other maintenance medications as listed above.  Follow up plan: No follow-ups on file.  Educational handout given for [place] PA-C [facility] [address]  [place], [place] [address] [phone]   06/08/2018, 4:55 PM

## 2018-06-11 ENCOUNTER — Encounter: Payer: Self-pay | Admitting: Physician Assistant

## 2018-06-14 ENCOUNTER — Encounter: Payer: Self-pay | Admitting: Physician Assistant

## 2018-06-24 ENCOUNTER — Encounter: Payer: Self-pay | Admitting: Family Medicine

## 2018-06-24 ENCOUNTER — Ambulatory Visit: Payer: Medicare Other | Admitting: Family Medicine

## 2018-06-24 VITALS — BP 119/70 | HR 92 | Temp 97.1°F | Ht 62.0 in | Wt 143.0 lb

## 2018-06-24 DIAGNOSIS — S060X0A Concussion without loss of consciousness, initial encounter: Secondary | ICD-10-CM | POA: Diagnosis not present

## 2018-06-24 DIAGNOSIS — L03221 Cellulitis of neck: Secondary | ICD-10-CM

## 2018-06-24 MED ORDER — SULFAMETHOXAZOLE-TRIMETHOPRIM 800-160 MG PO TABS: 1.0000 | ORAL_TABLET | Freq: Two times a day (BID) | ORAL | 0 refills | Status: AC

## 2018-06-24 NOTE — Progress Notes (Signed)
Subjective: CC: Cellulitis PCP: Chipper Herb, MD JME:QASTM R Berkel is a 72 y.o. female presenting to clinic today for:  1. Cellulitis Patient reports onset of redness and swelling on her right neck/shoulder over the last couple of days.  She has been attempting to use witch hazel, ice, oral Benadryl and topical Benadryl but the topical Benadryl seem to make it worse.  She notes associated tenderness.  She cannot identify any recent injury or reason why it symptoms may be present.  She does report that she was stung by bee about 2 weeks ago but that the bee sting had been asymptomatic after the first day.  No measured fevers.  She has had some dizziness but states that she injured herself about 3 weeks ago and hit the back of her head.  CT noted a hematoma.  Dizziness occurs with positional changes.   ROS: Per HPI  Allergies  Allergen Reactions  . Codeine     Headache    . Femring [Estradiol] Rash  . Cephalexin Rash  . Erythromycin Other (See Comments)    Gi  Upset    . Penicillins Rash   Past Medical History:  Diagnosis Date  . Acid reflux   . Allergy   . Cancer ([place])    SKIN  . Diverticulosis   . History of ETT 7/10   abnormal   . History of stomach ulcers 2008   positive h. pylori  . Hyperlipidemia   . Hypertension   . Hypothyroidism   . IBS (irritable bowel syndrome)     Current Outpatient Medications:  .  atorvastatin (LIPITOR) 20 MG tablet, Take 1 tablet (20 mg total) by mouth daily., Disp: 90 tablet, Rfl: 3 .  BIOTIN PO, Take by mouth.  , Disp: , Rfl:  .  Calcium Citrate-Vitamin D (CALCIUM CITRATE + D3 MAXIMUM) 315-250 MG-UNIT TABS, Take 1 tablet by mouth 2 (two) times daily., Disp: 120 tablet, Rfl:  .  Cholecalciferol (VITAMIN D PO), Take 5,000 Units by mouth daily., Disp: , Rfl:  .  clobetasol (TEMOVATE) 0.05 % external solution, Apply 1 application topically daily as needed. , Disp: , Rfl:  .  Cyanocobalamin (B-12) 1000 MCG SUBL, Place under the  tongue., Disp: , Rfl:  .  ezetimibe (ZETIA) 10 MG tablet, Take 1 tablet (10 mg total) by mouth daily., Disp: 90 tablet, Rfl: 3 .  gabapentin (NEURONTIN) 100 MG capsule, Take 2 capsules at bedtime, Disp: 180 capsule, Rfl: 3 .  hydrochlorothiazide (MICROZIDE) 12.5 MG capsule, TAKE (1) CAPSULE DAILY, Disp: 90 capsule, Rfl: 3 .  levothyroxine (SYNTHROID, LEVOTHROID) 50 MCG tablet, TAKE 1/2 TAB ON MON, WED, FRI & 1 TAB ON TUES AND THURS, Disp: 90 tablet, Rfl: 3 .  meloxicam (MOBIC) 15 MG tablet, Take 1 tablet (15 mg total) by mouth daily. (Patient not taking: Reported on 06/07/2018), Disp: 90 tablet, Rfl: 3 .  Multiple Vitamin (MULTI-VITAMIN PO), Take by mouth daily.  , Disp: , Rfl:  .  omeprazole (PRILOSEC) 20 MG capsule, Take 20 mg by mouth daily., Disp: , Rfl:  .  OVER THE COUNTER MEDICATION, Take 4 capsules by mouth daily. JR Carl[employer]'s Elite Omega Fish oil, Disp: , Rfl:  .  predniSONE (STERAPRED UNI-PAK 21 TAB) 10 MG (21) TBPK tablet, As directed x 6 days, Disp: 21 tablet, Rfl: 0 .  Probiotic Product (PROBIOTIC DAILY PO), Take by mouth., Disp: , Rfl:  .  valsartan (DIOVAN) 320 MG tablet, Take 1 tablet (320 mg total) by  mouth daily. As directed, Disp: 90 tablet, Rfl: 3 Social History   Socioeconomic History  . Marital status: Married    Spouse name: Not on file  . Number of children: 1  . Years of education: 75  . Highest education level: Bachelor's degree (e.g., BA, AB, BS)  Occupational History  . 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 Financial resource strain: Not hard at all  . Food insecurity:    Worry: Never true    Inability: Never true  . Transportation needs:    Medical: No    Non-medical: No  Tobacco Use  . Smoking status: Never Smoker  . Smokeless tobacco: Never Used  Substance and Sexual Activity  . Alcohol use: Yes    Alcohol/week: 14.0 standard drinks    Types: 14 Glasses of wine per week  . Drug use: No  . Sexual  activity: Never  Lifestyle  . Physical activity:    Days per week: 3 days    Minutes per session: 60 min  . Stress: Only a little  Relationships  . Social connections:    Talks on phone: More than three times a week    Gets together: More than three times a week    Attends religious service: More than 4 times per year    Active member of club or organization: Yes    Attends meetings of clubs or organizations: More than 4 times per year    Relationship status: Married  . Intimate partner violence:    Fear of current or ex partner: No    Emotionally abused: No    Physically abused: No    Forced sexual activity: No  Other Topics Concern  . Not on file  Social History Narrative  . Not on file   Family History  Problem Relation Age of Onset  . Diabetes Father   . Heart disease Father   . Heart attack Father 48  . Breast cancer Mother   . Breast cancer Paternal Grandmother        Aunt also   . Breast cancer Unknown        Family History  . Breast cancer Maternal Aunt   . Breast cancer Paternal Aunt   . Colon cancer Neg Hx     Objective: Office vital signs reviewed. BP 119/70   Pulse 92   Temp (!) 97.1 F (36.2 C)   Ht 5\' 2"  (1.575 m)   Wt 143 lb (64.9 kg)   SpO2 100%   BMI 26.16 kg/m   Physical Examination:  General: Awake, alert, well nourished, No acute distress HEENT: Posterior head with healing abrasion.  No palpable fluctuance or palpable bony abnormalities. Skin: Large area of blanching erythema and induration along the right side of the neck extending into the right upper back and shoulder.  Area is tender to palpation posteriorly.  No palpable fluctuance.  No visible punctum.  No exudate or bleeding.  It is warm to touch. Neuro: Alert and oriented.  No nystagmus or focal neurologic deficits.  Assessment/ Plan: 72 y.o. female   1. Cellulitis of neck Will treat as allergic and empirically with antibiotics.  I have recommended that she take Claritin in the  morning, Pepcid in the afternoon and Zyrtec in the evening to cover for the allergy component.  Okay to discontinue Benadryl.  I have also placed her on oral Septra to take twice daily for the next  7 days.  She is tolerated this medication in the past without difficulty.  Home care instructions reviewed.  Handout provided.  She will follow-up PRN.  2. Concussion without loss of consciousness, initial encounter I suspect that the dizziness may be related to the head injury she had 3 weeks ago.  It seems positional in nature.  We discussed mental/cognitive rest.  A handout on concussions was provided.  I did review the CT result which showed external hematoma but no other intracranial processes.   No orders of the defined types were placed in this encounter.  Meds ordered this encounter  Medications  . sulfamethoxazole-trimethoprim (BACTRIM DS) 800-160 MG tablet    Sig: Take 1 tablet by mouth 2 (two) times daily for 7 days.    Dispense:  14 tablet    Refill:  [place], DO [place] [phone]

## 2018-06-24 NOTE — Patient Instructions (Addendum)
Start the Septra antibiotic today. We are covering you for both allergic reaction and for infection.  Take the antibiotic as above. For allergy, take Claritin (loratidine) in the morning, Pepcid (famotidine) in the afternoon and Zyrtec (cetirizine) in the evening. You may continue applying ice to the affected area.  If symptoms worsen, you develop fevers, difficulty bending your neck, please seek immediate medical attention.   Concussion, Adult A concussion is a brain injury from a direct hit (blow) to the head or body. This injury causes the brain to shake quickly back and forth inside the skull. It is caused by:  A hit to the head.  A quick and sudden movement (jolt) of the head or neck.  How fast you will get better from a concussion depends on many things like how bad your concussion was, what part of your brain was hurt, how old you are, and how healthy you were before the concussion. Recovery can take time. It is important to wait to return to activity until a doctor says it is safe and your symptoms are all gone. Follow these instructions at home: Activity  Limit activities that need a lot of thought or concentration. These include: ? Homework or work for your job. ? Watching TV. ? Computer work. ? Playing memory games and puzzles.  Rest. Rest helps the brain to heal. Make sure you: ? Get plenty of sleep at night. Do not stay up late. ? Go to bed at the same time every day. ? Rest during the day. Take naps or rest breaks when you feel tired.  It can be dangerous if you get another concussion before the first one has healed Do not do activities that could cause a second concussion, such as riding a bike or playing sports.  Ask your doctor when you can return to your normal activities, like driving, riding a bike, or using machinery. Your ability to react may be slower. Do not do these activities if you are dizzy. Your doctor will likely give you a plan for slowly going back to  activities. General instructions  Take over-the-counter and prescription medicines only as told by your doctor.  Do not drink alcohol until your doctor says you can.  If it is harder than usual to remember things, write them down.  If you are easily distracted, try to do one thing at a time. For example, do not try to watch TV while making dinner.  Talk with family members or close friends when you need to make important decisions.  Watch your symptoms and tell other people to do the same. Other problems (complications) can happen after a concussion. Older adults with a brain injury may have a higher risk of serious problems, such as a blood clot in the brain.  Tell your teachers, school nurse, school counselor, coach, Product/process development scientist, or work Freight forwarder about your injury and symptoms. Tell them about what you can or cannot do. They should watch for: ? More problems with attention or concentration. ? More trouble remembering or learning new information. ? More time needed to do tasks or assignments. ? Being more annoyed (irritable) or having a harder time dealing with stress. ? Any other symptoms that get worse.  Keep all follow-up visits as told by your health care provider. This is important. Prevention  It is very important that you donot get another brain injury, especially before you have healed. In rare cases, another injury can cause permanent brain damage, brain swelling, or death.  You have the most risk if you get another head injury in the first 7-10 days after you were hurt before. To avoid injuries: ? Wear a seat belt when you ride in a car. ? Do not drink too much alcohol. ? Avoid activities that could make you get a second concussion, like contact sports. ? Wear a helmet when you do activities like:  Biking.  Skiing.  Skateboarding.  Skating. ? Make your home safe by:  Removing things from the floor or stairs that could make you trip.  Using grab bars in  bathrooms and handrails by stairs.  Placing non-slip mats on floors and in bathtubs.  Putting more light in dark areas. Contact a doctor if:  Your symptoms get worse.  You have new symptoms.  You keep having symptoms for more than 2 weeks. Get help right away if:  You have bad headaches, or your headaches get worse.  You have weakness in any part of your body.  You have loss of feeling (numbness).  You feel off balance.  You keep throwing up (vomiting).  You feel more sleepy.  The black center of one eye (pupil) is bigger than the other one.  You twitch or shake violently (convulse) or have a seizure.  Your speech is not clear (is slurred).  You feel more tired, more confused, or more annoyed.  You do not recognize people or places.  You have neck pain.  It is hard to wake you up.  You have strange behavior changes.  You pass out (lose consciousness). Summary  A concussion is a brain injury from a direct hit (blow) to the head or body.  This condition is treated with rest and careful watching of symptoms.  If you keep having symptoms for more than 2 weeks, call your doctor. This information is not intended to replace advice given to you by your health care provider. Make sure you discuss any questions you have with your health care provider. Document Released: 07/07/2009 Document Revised: 07/03/2016 Document Reviewed: 07/03/2016 Elsevier Interactive Patient Education  2017 Else[employer] Inc[employer]  Cellulitis, Adult Cellulitis is a skin infection. The infected area is usually red and sore. This condition occurs most often in the arms and lower legs. It is very important to get treated for this condition. Follow these instructions at home:  Take over-the-counter and prescription medicines only as told by your doctor.  If you were prescribed an antibiotic medicine, take it as told by your doctor. Do not stop taking the antibiotic even if you start to feel  better.  Drink enough fluid to keep your pee (urine) clear or pale yellow.  Do not touch or rub the infected area.  Raise (elevate) the infected area above the level of your heart while you are sitting or lying down.  Place warm or cold wet cloths (warm or cold compresses) on the infected area. Do this as told by your doctor.  Keep all follow-up visits as told by your doctor. This is important. These visits let your doctor make sure your infection is not getting worse. Contact a doctor if:  You have a fever.  Your symptoms do not get better after 1-2 days of treatment.  Your bone or joint under the infected area starts to hurt after the skin has healed.  Your infection comes back. This can happen in the same area or another area.  You have a swollen bump in the infected area.  You have new symptoms.  You feel  ill and also have muscle aches and pains. Get help right away if:  Your symptoms get worse.  You feel very sleepy.  You throw up (vomit) or have watery poop (diarrhea) for a long time.  There are red streaks coming from the infected area.  Your red area gets larger.  Your red area turns darker. This information is not intended to replace advice given to you by your health care provider. Make sure you discuss any questions you have with your health care provider. Document Released: 01/05/2008 Document Revised: 12/25/2015 Document Reviewed: 05/28/2015 Elsevier Interactive Patient Education  2018 [employer].

## 2018-07-01 ENCOUNTER — Other Ambulatory Visit: Payer: Self-pay | Admitting: Family Medicine

## 2018-08-15 ENCOUNTER — Encounter: Payer: Self-pay | Admitting: Family Medicine

## 2018-08-15 ENCOUNTER — Encounter: Payer: Self-pay | Admitting: Family

## 2018-08-15 ENCOUNTER — Ambulatory Visit: Payer: Medicare Other | Admitting: Family

## 2018-08-15 ENCOUNTER — Ambulatory Visit (INDEPENDENT_AMBULATORY_CARE_PROVIDER_SITE_OTHER): Payer: Medicare Other

## 2018-08-15 VITALS — BP 138/83 | HR 77 | Temp 97.0°F | Ht 62.0 in | Wt 143.0 lb

## 2018-08-15 DIAGNOSIS — M545 Low back pain, unspecified: Secondary | ICD-10-CM

## 2018-08-15 MED ORDER — METHYLPREDNISOLONE ACETATE 80 MG/ML IJ SUSP
80.0000 mg | Freq: Once | INTRAMUSCULAR | Status: AC
  Administered 2018-08-15: 80 mg via INTRAMUSCULAR

## 2018-08-15 MED ORDER — KETOROLAC TROMETHAMINE 60 MG/2ML IM SOLN
60.0000 mg | Freq: Once | INTRAMUSCULAR | Status: AC
  Administered 2018-08-15: 60 mg via INTRAMUSCULAR

## 2018-08-15 NOTE — Progress Notes (Signed)
   Subjective:    Patient ID: Marissa Perez, female    DOB: 1946/07/09, 73 y.o.   MRN: [id number]  Chief Complaint  Patient presents with  . Back Pain    lower started last week, worsening the last 2 days    Back Pain  This is a recurrent problem. The current episode started in the past 7 days. The problem occurs intermittently. The problem has been waxing and waning since onset. The pain is present in the gluteal. The quality of the pain is described as aching. The pain is at a severity of 2/10. The pain is moderate. The symptoms are aggravated by standing. Pertinent negatives include no bladder incontinence, bowel incontinence, dysuria, leg pain, tingling or weakness. She has tried NSAIDs for the symptoms. The treatment provided mild relief.      Review of Systems  Gastrointestinal: Negative for bowel incontinence.  Genitourinary: Negative for bladder incontinence and dysuria.  Musculoskeletal: Positive for back pain.  Neurological: Negative for tingling and weakness.  All other systems reviewed and are negative.      Objective:   Physical Exam Vitals signs reviewed.  Constitutional:      General: She is not in acute distress.    Appearance: She is well-developed.  HENT:     Head: Normocephalic and atraumatic.  Eyes:     Pupils: Pupils are equal, round, and reactive to light.  Neck:     Musculoskeletal: Normal range of motion and neck supple.     Thyroid: No thyromegaly.  Cardiovascular:     Rate and Rhythm: Normal rate and regular rhythm.     Heart sounds: Normal heart sounds. No murmur.  Pulmonary:     Effort: Pulmonary effort is normal. No respiratory distress.     Breath sounds: Normal breath sounds. No wheezing.  Abdominal:     General: Bowel sounds are normal. There is no distension.     Palpations: Abdomen is soft.     Tenderness: There is no abdominal tenderness.  Musculoskeletal:        General: No tenderness.     Comments: Full ROM of lower lumbar,  negative SLR  Skin:    General: Skin is warm and dry.  Neurological:     Mental Status: She is alert and oriented to person, place, and time.     Cranial Nerves: No cranial nerve deficit.     Deep Tendon Reflexes: Reflexes are normal and symmetric.  Psychiatric:        Behavior: Behavior normal.        Thought Content: Thought content normal.        Judgment: Judgment normal.       BP 138/83   Pulse 77   Temp (!) 97 F (36.1 C) (Oral)   Ht 5\' 2"  (1.575 m)   Wt 143 lb (64.9 kg)   BMI 26.16 kg/m      Assessment & Plan:  Marissa Perez comes in today with chief complaint of Back Pain (lower started last week, worsening the last 2 days)   Diagnosis and orders addressed:  1. Acute low back pain, unspecified back pain laterality, unspecified whether sciatica present Rest Ice ROM exercises encouraged Continue Mobic Discussed she can take 1-2 Gabapentins during the day as needed for pain RTO if symptoms worsen or do not improve  - DG Lumbar Spine 2-3 Views; Future - methylPREDNISolone acetate (DEPO-MEDROL) injection 80 mg - ketorolac (TORADOL) injection 60 mg   Evelina Dun, FNP

## 2018-08-15 NOTE — Patient Instructions (Signed)
Acute Back Pain, Adult  Acute back pain is sudden and usually short-lived. It is often caused by an injury to the muscles and tissues in the back. The injury may result from:   A muscle or ligament getting overstretched or torn (strained). Ligaments are tissues that connect bones to each other. Lifting something improperly can cause a back strain.   Wear and tear (degeneration) of the spinal disks. Spinal disks are circular tissue that provides cushioning between the bones of the spine (vertebrae).   Twisting motions, such as while playing sports or doing yard work.   A hit to the back.   Arthritis.  You may have a physical exam, lab tests, and imaging tests to find the cause of your pain. Acute back pain usually goes away with rest and home care.  Follow these instructions at home:  Managing pain, stiffness, and swelling   Take over-the-counter and prescription medicines only as told by your health care provider.   Your health care provider may recommend applying ice during the first 24-48 hours after your pain starts. To do this:  ? Put ice in a plastic bag.  ? Place a towel between your skin and the bag.  ? Leave the ice on for 20 minutes, 2-3 times a day.   If directed, apply heat to the affected area as often as told by your health care provider. Use the heat source that your health care provider recommends, such as a moist heat pack or a heating pad.  ? Place a towel between your skin and the heat source.  ? Leave the heat on for 20-30 minutes.  ? Remove the heat if your skin turns bright red. This is especially important if you are unable to feel pain, heat, or cold. You have a greater risk of getting burned.  Activity     Do not stay in bed. Staying in bed for more than 1-2 days can delay your recovery.   Sit up and stand up straight. Avoid leaning forward when you sit, or hunching over when you stand.  ? If you work at a desk, sit close to it so you do not need to lean over. Keep your chin tucked  in. Keep your neck drawn back, and keep your elbows bent at a right angle. Your arms should look like the letter "L."  ? Sit high and close to the steering wheel when you drive. Add lower back (lumbar) support to your car seat, if needed.   Take short walks on even surfaces as soon as you are able. Try to increase the length of time you walk each day.   Do not sit, drive, or stand in one place for more than 30 minutes at a time. Sitting or standing for long periods of time can put stress on your back.   Do not drive or use heavy machinery while taking prescription pain medicine.   Use proper lifting techniques. When you bend and lift, use positions that put less stress on your back:  ? Bend your knees.  ? Keep the load close to your body.  ? Avoid twisting.   Exercise regularly as told by your health care provider. Exercising helps your back heal faster and helps prevent back injuries by keeping muscles strong and flexible.   Work with a physical therapist to make a safe exercise program, as recommended by your health care provider. Do any exercises as told by your physical therapist.  Lifestyle   Maintain   a healthy weight. Extra weight puts stress on your back and makes it difficult to have good posture.   Avoid activities or situations that make you feel anxious or stressed. Stress and anxiety increase muscle tension and can make back pain worse. Learn ways to manage anxiety and stress, such as through exercise.  General instructions   Sleep on a firm mattress in a comfortable position. Try lying on your side with your knees slightly bent. If you lie on your back, put a pillow under your knees.   Follow your treatment plan as told by your health care provider. This may include:  ? Cognitive or behavioral therapy.  ? Acupuncture or massage therapy.  ? Meditation or yoga.  Contact a health care provider if:   You have pain that is not relieved with rest or medicine.   You have increasing pain going down  into your legs or buttocks.   Your pain does not improve after 2 weeks.   You have pain at night.   You lose weight without trying.   You have a fever or chills.  Get help right away if:   You develop new bowel or bladder control problems.   You have unusual weakness or numbness in your arms or legs.   You develop nausea or vomiting.   You develop abdominal pain.   You feel faint.  Summary   Acute back pain is sudden and usually short-lived.   Use proper lifting techniques. When you bend and lift, use positions that put less stress on your back.   Take over-the-counter and prescription medicines and apply heat or ice as directed by your health care provider.  This information is not intended to replace advice given to you by your health care provider. Make sure you discuss any questions you have with your health care provider.  Document Released: 07/19/2005 Document Revised: 02/23/2018 Document Reviewed: 03/02/2017  Elsevier Interactive Patient Education  2019 [employer] Inc[employer]

## 2018-08-18 ENCOUNTER — Telehealth: Payer: Self-pay | Admitting: Family Medicine

## 2018-08-18 MED ORDER — GABAPENTIN 100 MG PO CAPS: ORAL_CAPSULE | ORAL | 3 refills | Status: DC

## 2018-08-18 NOTE — Telephone Encounter (Signed)
PT has called states that Marissa Perez had changed her gabapentin (NEURONTIN) 100 MG capsule and pt will be running out soon and has 2 refills at pharmacy does she need to wait until she runs out and then we change the rx to way she is taking it now????

## 2018-08-23 ENCOUNTER — Other Ambulatory Visit: Payer: Self-pay | Admitting: Physical Medicine and Rehabilitation

## 2018-08-23 DIAGNOSIS — M5416 Radiculopathy, lumbar region: Secondary | ICD-10-CM

## 2018-08-29 ENCOUNTER — Ambulatory Visit
Admission: RE | Admit: 2018-08-29 | Discharge: 2018-08-29 | Disposition: A | Discharge status: Home / Self Care | Payer: Medicare Other | Source: Ambulatory Visit | Attending: Physical Medicine and Rehabilitation | Admitting: Physical Medicine and Rehabilitation

## 2018-08-29 ENCOUNTER — Other Ambulatory Visit: Payer: Self-pay | Admitting: Physical Medicine and Rehabilitation

## 2018-08-29 DIAGNOSIS — M5416 Radiculopathy, lumbar region: Secondary | ICD-10-CM

## 2018-08-29 MED ORDER — IOPAMIDOL (ISOVUE-M 200) INJECTION 41%
1.0000 mL | Freq: Once | INTRAMUSCULAR | Status: AC
  Administered 2018-08-29: 1 mL via EPIDURAL

## 2018-08-29 MED ORDER — METHYLPREDNISOLONE ACETATE 40 MG/ML INJ SUSP (RADIOLOG
120.0000 mg | Freq: Once | INTRAMUSCULAR | Status: AC
  Administered 2018-08-29: 120 mg via EPIDURAL

## 2018-08-29 NOTE — Discharge Instructions (Signed)

## 2018-09-08 ENCOUNTER — Ambulatory Visit: Payer: Medicare Other | Admitting: Family Medicine

## 2018-09-20 ENCOUNTER — Other Ambulatory Visit: Payer: Medicare Other

## 2018-09-20 DIAGNOSIS — E7800 Pure hypercholesterolemia, unspecified: Secondary | ICD-10-CM

## 2018-09-20 DIAGNOSIS — E559 Vitamin D deficiency, unspecified: Secondary | ICD-10-CM

## 2018-09-20 DIAGNOSIS — I10 Essential (primary) hypertension: Secondary | ICD-10-CM

## 2018-09-20 DIAGNOSIS — Z8249 Family history of ischemic heart disease and other diseases of the circulatory system: Secondary | ICD-10-CM

## 2018-09-20 DIAGNOSIS — K219 Gastro-esophageal reflux disease without esophagitis: Secondary | ICD-10-CM

## 2018-09-21 ENCOUNTER — Encounter: Payer: Self-pay | Admitting: Family Medicine

## 2018-09-21 ENCOUNTER — Ambulatory Visit: Payer: Medicare Other | Admitting: Family Medicine

## 2018-09-21 VITALS — BP 127/72 | HR 67 | Temp 96.9°F | Ht 62.0 in | Wt 139.0 lb

## 2018-09-21 DIAGNOSIS — I10 Essential (primary) hypertension: Secondary | ICD-10-CM | POA: Diagnosis not present

## 2018-09-21 DIAGNOSIS — E559 Vitamin D deficiency, unspecified: Secondary | ICD-10-CM | POA: Diagnosis not present

## 2018-09-21 DIAGNOSIS — K219 Gastro-esophageal reflux disease without esophagitis: Secondary | ICD-10-CM

## 2018-09-21 DIAGNOSIS — E7800 Pure hypercholesterolemia, unspecified: Secondary | ICD-10-CM

## 2018-09-21 DIAGNOSIS — Z8249 Family history of ischemic heart disease and other diseases of the circulatory system: Secondary | ICD-10-CM

## 2018-09-21 LAB — CBC WITH DIFFERENTIAL/PLATELET
BASOS: 1 %
Basophils Absolute: 0 10*3/uL (ref 0.0–0.2)
EOS (ABSOLUTE): 0.1 10*3/uL (ref 0.0–0.4)
Eos: 2 %
HEMATOCRIT: 36.8 % (ref 34.0–46.6)
HEMOGLOBIN: 13 g/dL (ref 11.1–15.9)
IMMATURE GRANS (ABS): 0 10*3/uL (ref 0.0–0.1)
Immature Granulocytes: 0 %
LYMPHS ABS: 1.7 10*3/uL (ref 0.7–3.1)
LYMPHS: 37 %
MCH: 32.4 pg (ref 26.6–33.0)
MCHC: 35.3 g/dL (ref 31.5–35.7)
MCV: 92 fL (ref 79–97)
MONOCYTES: 8 %
Monocytes Absolute: 0.4 10*3/uL (ref 0.1–0.9)
NEUTROS ABS: 2.4 10*3/uL (ref 1.4–7.0)
Neutrophils: 52 %
Platelets: 338 10*3/uL (ref 150–450)
RBC: 4.01 x10E6/uL (ref 3.77–5.28)
RDW: 12.3 % (ref 11.7–15.4)
WBC: 4.6 10*3/uL (ref 3.4–10.8)

## 2018-09-21 LAB — LIPID PANEL
Chol/HDL Ratio: 1.8 ratio (ref 0.0–4.4)
Cholesterol, Total: 178 mg/dL (ref 100–199)
HDL: 98 mg/dL (ref >39)
LDL Calculated: 64 mg/dL (ref 0–99)
Triglycerides: 81 mg/dL (ref 0–149)
VLDL Cholesterol Cal: 16 mg/dL (ref 5–40)

## 2018-09-21 LAB — BMP8+EGFR
BUN/Creatinine Ratio: 19 (ref 12–28)
BUN: 13 mg/dL (ref 8–27)
CALCIUM: 9.5 mg/dL (ref 8.7–10.3)
CO2: 25 mmol/L (ref 20–29)
Chloride: 91 mmol/L — ABNORMAL LOW (ref 96–106)
Creatinine, Ser: 0.7 mg/dL (ref 0.57–1.00)
GFR calc Af Amer: 100 mL/min/{1.73_m2} (ref >59)
GFR calc non Af Amer: 87 mL/min/{1.73_m2} (ref >59)
Glucose: 79 mg/dL (ref 65–99)
POTASSIUM: 4.7 mmol/L (ref 3.5–5.2)
Sodium: 132 mmol/L — ABNORMAL LOW (ref 134–144)

## 2018-09-21 LAB — HEPATIC FUNCTION PANEL
ALT: 31 IU/L (ref 0–32)
AST: 23 IU/L (ref 0–40)
Albumin: 5.1 g/dL — ABNORMAL HIGH (ref 3.7–4.7)
Alkaline Phosphatase: 64 IU/L (ref 39–117)
BILIRUBIN TOTAL: 0.3 mg/dL (ref 0.0–1.2)
BILIRUBIN, DIRECT: 0.12 mg/dL (ref 0.00–0.40)
Total Protein: 7.2 g/dL (ref 6.0–8.5)

## 2018-09-21 LAB — VITAMIN D 25 HYDROXY (VIT D DEFICIENCY, FRACTURES): Vit D, 25-Hydroxy: 46.3 ng/mL (ref 30.0–100.0)

## 2018-09-21 MED ORDER — ATORVASTATIN CALCIUM 20 MG PO TABS: 20.0000 mg | ORAL_TABLET | Freq: Every day | ORAL | 3 refills | Status: DC

## 2018-09-21 NOTE — Patient Instructions (Addendum)
Medicare Annual Wellness Visit  [facility] and the medical providers at [facility] strive to bring you the best medical care.  In doing so we not only want to address your current medical conditions and concerns but also to detect new conditions early and prevent illness, disease and health-related problems.    Medicare offers a yearly Wellness Visit which allows our clinical staff to assess your need for preventative services including immunizations, lifestyle education, counseling to decrease risk of preventable diseases and screening for fall risk and other medical concerns.    This visit is provided free of charge (no copay) for all Medicare recipients. The clinical pharmacists at [facility] have begun to conduct these Wellness Visits which will also include a thorough review of all your medications.    As you primary medical provider recommend that you make an appointment for your Annual Wellness Visit if you have not done so already this year.  You may set up this appointment before you leave today or you may call back ([phone]) and schedule an appointment.  Please make sure when you call that you mention that you are scheduling your Annual Wellness Visit with the clinical pharmacist so that the appointment may be made for the proper length of time.     Continue current medications. Continue good therapeutic lifestyle changes which include good diet and exercise. Fall precautions discussed with patient. If an FOBT was given today- please return it to our front desk. If you are over 37 years old - you may need Prevnar 10 or the adult Pneumonia vaccine.  **Flu shots are available--- please call and schedule a FLU-CLINIC appointment**  After your visit with [place] today you will receive a survey in the mail or online from [employer] regarding your care with [place]. Please take a moment to fill this out. Your feedback is very  important to [place] as you can help [place] better understand your patient needs as well as improve your experience and satisfaction. WE CARE ABOUT YOU!!!   Stay active, drink plenty of fluids. Continue with current medicines and therapeutic lifestyle changes We may add a thyroid profile to the blood work results that you had done today and if we have to do that we will call you with those results Continue to be careful and do not put yourself at risk for falling

## 2018-09-21 NOTE — Progress Notes (Signed)
Subjective:    Patient ID: Marissa Perez, female    DOB: 05/20/1946, 73 y.o.   MRN: [id number]  HPI Pt here for follow up and management of chronic medical problems which includes hyperlipidemia and hypertension. She is taking medication regularly.  The patient's recent lab work was reviewed with her.  All of her cholesterol numbers were excellent.  The CBC was within normal limits.  The creatinine was good and the sodium and chloride were slightly decreased and we will continue to monitor that.  All the cholesterol numbers were excellent.  She will continue with her current treatment regimen for her cholesterol.  The patient denies any chest pain pressure tightness or shortness of breath.  She denies any trouble with her stomach including nausea vomiting diarrhea blood in the stool black tarry bowel movements.  She is passing her water well without problems.   Patient Active Problem List   Diagnosis Date Noted  . Lumbar spondylosis 12/28/2017  . Degeneration of lumbar intervertebral disc 12/13/2017  . Aortic atherosclerosis ([place]) 09/22/2017  . Family history of breast cancer 07/05/2016  . Skin cancer 07/05/2016  . Vitamin D deficiency 07/05/2016  . Hypothyroidism 12/11/2012  . Hyperlipidemia 12/11/2012  . Hypertension 12/11/2012  . GERD (gastroesophageal reflux disease) 12/11/2012   Outpatient Encounter Medications as of 09/21/2018  Medication Sig  . atorvastatin (LIPITOR) 20 MG tablet Take 1 tablet (20 mg total) by mouth daily.  [name] Kitchen BIOTIN PO Take by mouth.    . Calcium Citrate-Vitamin D (CALCIUM CITRATE + D3 MAXIMUM) 315-250 MG-UNIT TABS Take 1 tablet by mouth 2 (two) times daily.  . Cholecalciferol (VITAMIN D PO) Take 5,000 Units by mouth daily.  . clobetasol (TEMOVATE) 0.05 % external solution Apply 1 application topically daily as needed.   . Cyanocobalamin (B-12) 1000 MCG SUBL Place under the tongue.  . ezetimibe (ZETIA) 10 MG tablet TAKE 1 TABLET DAILY  . gabapentin (NEURONTIN)  100 MG capsule Take 100 mg in the AM, 100 mg at lunch and 200mg  capsules at bedtime  . hydrochlorothiazide (MICROZIDE) 12.5 MG capsule TAKE (1) CAPSULE DAILY  . levothyroxine (SYNTHROID, LEVOTHROID) 50 MCG tablet TAKE 1/2 TAB ON MON, WED, FRI & 1 TAB ON TUES AND THURS  . meloxicam (MOBIC) 15 MG tablet Take 1 tablet (15 mg total) by mouth daily.  . Multiple Vitamin (MULTI-VITAMIN PO) Take by mouth daily.    [name] Kitchen omeprazole (PRILOSEC) 20 MG capsule Take 20 mg by mouth daily.  [name] Kitchen OVER THE COUNTER MEDICATION Take 4 capsules by mouth daily. JR Carlson's Elite Omega Fish oil  . Probiotic Product (PROBIOTIC DAILY PO) Take by mouth.  . valsartan (DIOVAN) 320 MG tablet Take 1 tablet (320 mg total) by mouth daily. As directed   No facility-administered encounter medications on file as of 09/21/2018.      Review of Systems  Constitutional: Negative.   HENT: Negative.   Eyes: Negative.   Respiratory: Negative.   Cardiovascular: Negative.   Gastrointestinal: Negative.   Endocrine: Negative.   Genitourinary: Negative.   Musculoskeletal: Negative.   Skin: Negative.   Allergic/Immunologic: Negative.   Neurological: Negative.   Hematological: Negative.   Psychiatric/Behavioral: Negative.        Objective:   Physical Exam Vitals signs and nursing note reviewed.  Constitutional:      General: She is not in acute distress.    Appearance: Normal appearance. She is well-developed and normal weight. She is not ill-appearing.  HENT:     Head: Normocephalic  and atraumatic.     Right Ear: Tympanic membrane, ear canal and external ear normal. There is no impacted cerumen.     Left Ear: Tympanic membrane, ear canal and external ear normal. There is no impacted cerumen.     Nose: Nose normal. No congestion.     Mouth/Throat:     Mouth: Mucous membranes are moist.     Pharynx: No oropharyngeal exudate.  Eyes:     General: No scleral icterus.       Right eye: No discharge.        Left eye: No discharge.       Extraocular Movements: Extraocular movements intact.     Conjunctiva/sclera: Conjunctivae normal.     Pupils: Pupils are equal, round, and reactive to light.  Neck:     Musculoskeletal: Normal range of motion and neck supple.     Thyroid: No thyromegaly.     Vascular: No carotid bruit or JVD.     Comments: No thyromegaly anterior cervical adenopathy or bruits Cardiovascular:     Rate and Rhythm: Normal rate and regular rhythm.     Pulses: Normal pulses.     Heart sounds: Normal heart sounds. No murmur.     Comments: The heart is regular at 72/min with good pedal pulses and no edema Pulmonary:     Effort: Pulmonary effort is normal.     Breath sounds: Normal breath sounds. No wheezing or rales.     Comments: Clear anteriorly and posteriorly Abdominal:     General: Abdomen is flat. Bowel sounds are normal.     Palpations: Abdomen is soft. There is no mass.     Tenderness: There is no abdominal tenderness.     Comments: Soft without masses tenderness organ enlargement or bruits  Musculoskeletal: Normal range of motion.        General: No tenderness.     Left lower leg: No edema.  Lymphadenopathy:     Cervical: No cervical adenopathy.  Skin:    General: Skin is warm and dry.     Findings: No rash.  Neurological:     General: No focal deficit present.     Mental Status: She is alert and oriented to person, place, and time. Mental status is at baseline.     Cranial Nerves: No cranial nerve deficit.     Motor: No weakness.     Gait: Gait normal.     Deep Tendon Reflexes: Reflexes are normal and symmetric. Reflexes normal.  Psychiatric:        Mood and Affect: Mood normal.        Behavior: Behavior normal.        Thought Content: Thought content normal.        Judgment: Judgment normal.     BP 127/72 (BP Location: Left Arm)   Pulse 67   Temp (!) 96.9 F (36.1 C) (Oral)   Ht 5\' 2"  (1.575 m)   Wt 139 lb (63 kg)   BMI 25.42 kg/m        Assessment & Plan:  1. Pure  hypercholesterolemia -All cholesterol numbers were good and the patient will continue with current treatment and therapeutic lifestyle changes  2. Gastroesophageal reflux disease, esophagitis presence not specified -No complaints today with reflux and she will continue with watching her diet closely and taking omeprazole  3. Vitamin D deficiency -[place] with vitamin D replacement  4. Essential hypertension -Blood pressure is good today and she will continue with her current  treatment and sodium restriction  5. Family history of heart disease -Take statin regularly watch diet closely and start exercising again as the weather warms  Meds ordered this encounter  Medications  . atorvastatin (LIPITOR) 20 MG tablet    Sig: Take 1 tablet (20 mg total) by mouth daily.    Dispense:  90 tablet    Refill:  3   Patient Instructions                       Medicare Annual Wellness Visit  [facility] and the medical providers at [facility] strive to bring you the best medical care.  In doing so we not only want to address your current medical conditions and concerns but also to detect new conditions early and prevent illness, disease and health-related problems.    Medicare offers a yearly Wellness Visit which allows our clinical staff to assess your need for preventative services including immunizations, lifestyle education, counseling to decrease risk of preventable diseases and screening for fall risk and other medical concerns.    This visit is provided free of charge (no copay) for all Medicare recipients. The clinical pharmacists at [facility] have begun to conduct these Wellness Visits which will also include a thorough review of all your medications.    As you primary medical provider recommend that you make an appointment for your Annual Wellness Visit if you have not done so already this year.  You may set up this appointment before you leave  today or you may call back ([phone]) and schedule an appointment.  Please make sure when you call that you mention that you are scheduling your Annual Wellness Visit with the clinical pharmacist so that the appointment may be made for the proper length of time.     Continue current medications. Continue good therapeutic lifestyle changes which include good diet and exercise. Fall precautions discussed with patient. If an FOBT was given today- please return it to our front desk. If you are over 79 years old - you may need Prevnar 29 or the adult Pneumonia vaccine.  **Flu shots are available--- please call and schedule a FLU-CLINIC appointment**  After your visit with [place] today you will receive a survey in the mail or online from [employer] regarding your care with [place]. Please take a moment to fill this out. Your feedback is very important to [place] as you can help [place] better understand your patient needs as well as improve your experience and satisfaction. WE CARE ABOUT YOU!!!   Stay active, drink plenty of fluids. Continue with current medicines and therapeutic lifestyle changes We may add a thyroid profile to the blood work results that you had done today and if we have to do that we will call you with those results Continue to be careful and do not put yourself at risk for falling  Arrie Senate MD

## 2018-09-23 LAB — THYROID PANEL WITH TSH
Free Thyroxine Index: 2.1 (ref 1.2–4.9)
T3 Uptake Ratio: 30 % (ref 24–39)
T4, Total: 6.9 ug/dL (ref 4.5–12.0)
TSH: 4.69 u[IU]/mL — ABNORMAL HIGH (ref 0.450–4.500)

## 2018-09-23 LAB — SPECIMEN STATUS REPORT

## 2018-09-25 ENCOUNTER — Other Ambulatory Visit: Payer: Self-pay | Admitting: *Deleted

## 2018-09-25 DIAGNOSIS — R7989 Other specified abnormal findings of blood chemistry: Secondary | ICD-10-CM

## 2018-10-06 ENCOUNTER — Other Ambulatory Visit: Payer: Self-pay | Admitting: *Deleted

## 2018-10-06 MED ORDER — VALSARTAN 160 MG PO TABS: 160.0000 mg | ORAL_TABLET | Freq: Every day | ORAL | 3 refills | Status: DC

## 2018-10-19 ENCOUNTER — Other Ambulatory Visit: Payer: Self-pay

## 2018-10-19 ENCOUNTER — Other Ambulatory Visit: Payer: Medicare Other

## 2018-10-19 DIAGNOSIS — R7989 Other specified abnormal findings of blood chemistry: Secondary | ICD-10-CM

## 2018-10-20 LAB — THYROID PANEL WITH TSH
Free Thyroxine Index: 1.9 (ref 1.2–4.9)
T3 Uptake Ratio: 31 % (ref 24–39)
T4, Total: 6.2 ug/dL (ref 4.5–12.0)
TSH: 3.81 u[IU]/mL (ref 0.450–4.500)

## 2019-01-10 ENCOUNTER — Other Ambulatory Visit: Payer: Self-pay | Admitting: Family Medicine

## 2019-01-12 ENCOUNTER — Encounter: Payer: Self-pay | Admitting: *Deleted

## 2019-01-12 ENCOUNTER — Other Ambulatory Visit: Payer: Self-pay | Admitting: Family Medicine

## 2019-01-12 ENCOUNTER — Ambulatory Visit (INDEPENDENT_AMBULATORY_CARE_PROVIDER_SITE_OTHER): Payer: Medicare Other | Admitting: *Deleted

## 2019-01-12 DIAGNOSIS — Z0000 Encounter for general adult medical examination without abnormal findings: Secondary | ICD-10-CM

## 2019-01-12 DIAGNOSIS — Z1231 Encounter for screening mammogram for malignant neoplasm of breast: Secondary | ICD-10-CM

## 2019-01-12 NOTE — Patient Instructions (Signed)
  Marissa Perez , Thank you for taking time to talk with me for your Medicare Wellness Visit. I appreciate your ongoing commitment to your health goals. Please review the following plan we discussed and let me know if I can assist you in the future.   These are the goals we discussed: Goals    . Exercise 150 min/wk Moderate Activity    . Increase water intake     Aim for drinking half of your body weight in ounces of water daily.         This is a list of the screening recommended for you and due dates:  Health Maintenance  Topic Date Due  . Stool Blood Test  12/30/2018  . Flu Shot  03/03/2019  . Mammogram  12/08/2019  . Tetanus Vaccine  02/10/2021  . Colon Cancer Screening  10/11/2023  . DEXA scan (bone density measurement)  08/26/2027  .  Hepatitis C: One time screening is recommended by Center for Disease Control  (CDC) for  adults born from [birth year] through 1965.   Completed  . Pneumonia vaccines  Completed

## 2019-01-12 NOTE — Progress Notes (Signed)
MEDICARE ANNUAL WELLNESS VISIT  01/12/2019  Telephone Visit Disclaimer This Medicare AWV was conducted by telephone due to national recommendations for restrictions regarding the COVID-19 Pandemic (e.g. social distancing).  I verified, using two identifiers, that I am speaking with Marissa Perez or their authorized healthcare agent. I discussed the limitations, risks, security, and privacy concerns of performing an evaluation and management service by telephone and the potential availability of an in-person appointment in the future. The patient expressed understanding and agreed to proceed.   Subjective:  Marissa Perez is a 73 y.o. female patient of Chipper Herb, MD who had a Medicare Annual Wellness Visit today via telephone. 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  She lives with her husband Marissa Perez. They have 1 son and 1 granddaughter.  She reports that she is socially active and does interact with friends/family regularly.  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  She is minimally physically active because she has been back pain for the past several months.  She plans to start increasing her walking again soon.  She enjoys knitting, reading, and working in her flowers.  Patient Care Team: Chipper Herb, MD as PCP - General (Family Medicine) Gaynelle Arabian, MD as Consulting Physician (Orthopedic Surgery) Suella Broad, MD as Consulting Physician (Physical Medicine and Rehabilitation) Druscilla Brownie, MD as Consulting Physician (Dermatology) Dyke Maes, [place] as Consulting Physician (Optometry) Inda Castle, MD (Inactive) as Consulting Physician (Gastroenterology)  Advanced Directives 01/12/2019 01/10/2018 01/03/2017 12/12/2015  Does Patient Have a Medical Advance Directive? Yes Yes Yes Yes  Type of Paramedic of [place];Living will [place];Living  will [place];Living will [place];Living will  Does patient want to make changes to medical advance directive? No - Patient declined - - No - Patient declined  Copy of [place] in Chart? Yes - validated most recent copy scanned in chart (See row information) No - copy requested No - copy requested No - copy requested    Hospital Utilization Over the Past 12 Months: # of hospitalizations or ER visits: 0 # of surgeries: 0  Review of Systems    Patient reports that her overall health is unchanged compared to last year.   Review of Systems:   All systems negative- reported by patient.  Pain Assessment Pain Score: 0-No pain     Current Medications & Allergies (verified) Allergies as of 01/12/2019      Reactions   Codeine    Headache    Femring [estradiol] Rash   Cephalexin Rash   Erythromycin Other (See Comments)   Gi  Upset    Penicillins Rash      Medication List       Accurate as of January 12, 2019 10:10 AM. If you have any questions, ask your nurse or doctor.        atorvastatin 20 MG tablet Commonly known as: LIPITOR Take 1 tablet (20 mg total) by mouth daily.   B-12 1000 MCG Subl Place under the tongue.   BIOTIN PO Take by mouth.   Calcium Citrate-Vitamin D 315-250 MG-UNIT Tabs Commonly known as: Calcium Citrate + D3 Maximum Take 1 tablet by mouth 2 (two) times daily.   clobetasol 0.05 % external solution Commonly known as: TEMOVATE Apply 1 application topically daily as needed.   ezetimibe 10 MG tablet Commonly known as:  ZETIA TAKE 1 TABLET DAILY   gabapentin 100 MG capsule Commonly known as: NEURONTIN Take 100 mg in the AM, 100 mg at lunch and 200mg  capsules at bedtime What changed: additional instructions   hydrochlorothiazide 12.5 MG capsule Commonly known as: MICROZIDE TAKE (1) CAPSULE DAILY   levothyroxine 50 MCG tablet Commonly known as: SYNTHROID TAKE 1/2 TAB ON MON, WED, FRI  & 1 TAB ON TUES AND THURS What changed: additional instructions   meloxicam 15 MG tablet Commonly known as: MOBIC Take 1 tablet (15 mg total) by mouth daily.   MULTI-VITAMIN PO Take by mouth daily.   omeprazole 20 MG capsule Commonly known as: PRILOSEC Take 20 mg by mouth daily.   OVER THE COUNTER MEDICATION Take 4 capsules by mouth daily. JR Carlson's Elite Omega Fish oil   PROBIOTIC DAILY PO Take by mouth.   valsartan 160 MG tablet Commonly known as: DIOVAN Take 1 tablet (160 mg total) by mouth daily. As directed   VITAMIN D PO Take 5,000 Units by mouth daily.       History (reviewed): Past Medical History:  Diagnosis Date  . Acid reflux   . Allergy   . Cancer ([place])    SKIN  . Diverticulosis   . History of ETT 7/10   abnormal   . History of stomach ulcers 2008   positive h. pylori  . Hyperlipidemia   . Hypertension   . Hypothyroidism   . IBS (irritable bowel syndrome)    Past Surgical History:  Procedure Laterality Date  . [place]  . MOHS SURGERY    . TONSILLECTOMY  1951  . TOTAL ABDOMINAL HYSTERECTOMY W/ BILATERAL SALPINGOOPHORECTOMY  1990   Dr. Tamala Julian / fibroids    Family History  Problem Relation Age of Onset  . Diabetes Father   . Heart disease Father   . Heart attack Father 78  . Breast cancer Mother   . Breast cancer Paternal Grandmother        Aunt also   . Breast cancer Other        Family History  . Breast cancer Maternal Aunt   . Breast cancer Paternal Aunt   . Colon cancer Neg Hx    Social History   Socioeconomic History  . Marital status: Married    Spouse name: Not on file  . Number of children: 1  . Years of education: 71  . Highest education level: Bachelor's degree (e.g., BA, AB, BS)  Occupational History  . 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 Financial resource strain: Not hard at all  . Food insecurity    Worry: Never true    Inability: Never true   . Transportation needs    Medical: No    Non-medical: No  Tobacco Use  . Smoking status: Never Smoker  . Smokeless tobacco: Never Used  Substance and Sexual Activity  . Alcohol use: Yes    Alcohol/week: 14.0 standard drinks    Types: 14 Glasses of wine per week  . Drug use: No  . Sexual activity: Never  Lifestyle  . Physical activity    Days per week: 0 days    Minutes per session: 0 min  . Stress: Only a little  Relationships  . Social connections    Talks on phone: More than three times a week    Gets together: More than three times a week    Attends religious  service: More than 4 times per year    Active member of club or organization: Yes    Attends meetings of clubs or organizations: More than 4 times per year    Relationship status: Married  Other Topics Concern  . Not on file  Social History Narrative  . Not on file    Activities of Daily Living In your present state of health, do you have any difficulty performing the following activities: 01/12/2019 01/12/2019  Hearing? - N  Vision? - N  Difficulty concentrating or making decisions? - N  Walking or climbing stairs? - Y  Comment - Due to back pain  Dressing or bathing? - N  Doing errands, shopping? - N  Conservation officer, nature and eating ? - N  Using the Toilet? - N  In the past six months, have you accidently leaked urine? - N  Do you have problems with loss of bowel control? - N  Managing your Medications? - N  Managing your Finances? - N  Housekeeping or managing your Housekeeping? Y N  Comment Due to back pain.  Has someone who helps her clean -  Some recent data might be hidden        Exercise Current Exercise Habits: The patient does not participate in regular exercise at present, Exercise limited by: orthopedic condition(s)  Diet Patient reports consuming 3 meals a day and 1 snack(s) a day Patient reports that her primary diet is: Regular, Low fat Patient reports that she does have regular access to  food.   Depression Screen PHQ 2/9 Scores 01/12/2019 09/21/2018 06/24/2018 05/08/2018 03/06/2018 01/10/2018 12/28/2017  PHQ - 2 Score 1 0 0 0 0 0 0     Fall Risk Fall Risk  01/12/2019 09/21/2018 05/08/2018 03/06/2018 01/10/2018  Falls in the past year? 1 1 Yes No No  Number falls in past yr: 0 0 1 - -  Injury with Fall? 1 0 No - -  Risk for fall due to :  - - - -  Follow up Falls prevention discussed;Education provided - - - -     Objective:  Marissa Perez seemed alert and oriented and she participated appropriately during our telephone visit.  Blood Pressure Weight BMI  BP Readings from Last 3 Encounters:  09/21/18 127/72  08/29/18 (!) 165/72  08/15/18 138/83   Wt Readings from Last 3 Encounters:  09/21/18 139 lb (63 kg)  08/15/18 143 lb (64.9 kg)  06/24/18 143 lb (64.9 kg)   BMI Readings from Last 1 Encounters:  09/21/18 25.42 kg/m    *Unable to obtain current vital signs, weight, and BMI due to telephone visit type  Hearing/Vision  . Deniese did not seem to have difficulty with hearing/understanding during the telephone conversation . Reports that she has had a formal eye exam by an eye care professional within the past year . Reports that she has not had a formal hearing evaluation within the past year *Unable to fully assess hearing and vision during telephone visit type  Cognitive Function: 6CIT Screen 01/12/2019  What Year? 0 points  What month? 0 points  What time? 0 points  Count back from 20 0 points  Months in reverse 0 points  Repeat phrase 0 points  Total Score 0   (Normal:0-7, Significant for Dysfunction: >8)  Normal Cognitive Function Screening: Yes   Immunization & Health Maintenance Record Immunization History  Administered Date(s) Administered  . Influenza Split 05/21/2013  . Influenza, High Dose Seasonal PF 05/10/2017, 05/08/2018  .  Influenza, Quadrivalent, Recombinant, Inj, Pf 05/06/2016  . Influenza,inj,Quad PF,6+ Mos 05/02/2015  .  Influenza,inj,quad, With Preservative 05/10/2014, 05/02/2017  . Pneumococcal Conjugate-13 08/29/2013  . Tdap 02/03/2011  . Zoster Recombinat (Shingrix) 01/03/2017, 04/11/2017    Health Maintenance  Topic Date Due  . COLON CANCER SCREENING ANNUAL FOBT  12/30/2018  . INFLUENZA VACCINE  03/03/2019  . MAMMOGRAM  12/08/2019  . TETANUS/TDAP  02/10/2021  . COLONOSCOPY  10/11/2023  . DEXA SCAN  08/26/2027  . Hepatitis C Screening  Completed  . PNA vac Low Risk Adult  Completed       Assessment  This is a routine wellness examination for [employer].  Health Maintenance: Due or Overdue Health Maintenance Due  Topic Date Due  . COLON CANCER SCREENING ANNUAL FOBT  12/30/2018    Marissa Perez does not need a referral for Community Assistance: Care Management:   no Social Work:    no Prescription Assistance:  no Nutrition/Diabetes Education:  no   Plan:  Personalized Goals Goals Addressed            This Visit's Progress   . Exercise 150 min/wk Moderate Activity   Not on track   . Increase water intake   Not on track    Aim for drinking half of your body weight in ounces of water daily.        Personalized Health Maintenance & Screening Recommendations  Screening mammography  Lung Cancer Screening Recommended: no (Low Dose CT Chest recommended if Age 43-80 years, 30 pack-year currently smoking OR have quit w/in past 15 years) Hepatitis C Screening recommended: completed 11/25/2016   Advanced Directives: Written information was not prepared per patient's request.  Referrals & Orders N/A  Follow-up Plan . Follow-up with Chipper Herb, MD as planned . Work on your goals of increasing your exercise and water intake. . Schedule your mammogram at [facility].    I have personally reviewed and noted the following in the patient's chart:   . Medical and social history . Use of alcohol, tobacco or illicit drugs  . Current medications and  supplements . Functional ability and status . Nutritional status . Physical activity . Advanced directives . List of other physicians . Hospitalizations, surgeries, and ER visits in previous 12 months . Vitals . Screenings to include cognitive, depression, and falls . Referrals and appointments  In addition, I have reviewed and discussed with Marissa Perez certain preventive protocols, quality metrics, and best practice recommendations. A written personalized care plan for preventive services as well as general preventive health recommendations is available and can be mailed to the patient at her request.      Nolberto Hanlon, RN  01/12/2019

## 2019-01-25 ENCOUNTER — Encounter: Payer: Self-pay | Admitting: Family Medicine

## 2019-01-25 ENCOUNTER — Other Ambulatory Visit: Payer: Self-pay

## 2019-01-25 ENCOUNTER — Ambulatory Visit (INDEPENDENT_AMBULATORY_CARE_PROVIDER_SITE_OTHER): Payer: Medicare Other | Admitting: Family Medicine

## 2019-01-25 DIAGNOSIS — M159 Polyosteoarthritis, unspecified: Secondary | ICD-10-CM | POA: Insufficient documentation

## 2019-01-25 DIAGNOSIS — E034 Atrophy of thyroid (acquired): Secondary | ICD-10-CM | POA: Diagnosis not present

## 2019-01-25 DIAGNOSIS — I10 Essential (primary) hypertension: Secondary | ICD-10-CM | POA: Diagnosis not present

## 2019-01-25 DIAGNOSIS — M150 Primary generalized (osteo)arthritis: Secondary | ICD-10-CM

## 2019-01-25 DIAGNOSIS — K219 Gastro-esophageal reflux disease without esophagitis: Secondary | ICD-10-CM

## 2019-01-25 DIAGNOSIS — E782 Mixed hyperlipidemia: Secondary | ICD-10-CM

## 2019-01-25 DIAGNOSIS — Z803 Family history of malignant neoplasm of breast: Secondary | ICD-10-CM

## 2019-01-25 DIAGNOSIS — I700 Atherosclerosis of aorta: Secondary | ICD-10-CM

## 2019-01-25 DIAGNOSIS — M47816 Spondylosis without myelopathy or radiculopathy, lumbar region: Secondary | ICD-10-CM

## 2019-01-25 DIAGNOSIS — E559 Vitamin D deficiency, unspecified: Secondary | ICD-10-CM

## 2019-01-25 NOTE — Progress Notes (Signed)
Virtual Visit Via telephone Note I connected with@ on 01/25/19 by telephone and verified that I am speaking with the correct person or authorized healthcare agent using two identifiers. Marissa Perez is currently located at home and there are no unauthorized people in close proximity. I completed this visit while in a private location in my home .  This visit type was conducted due to national recommendations for restrictions regarding the COVID-19 Pandemic (e.g. social distancing).  This format is felt to be most appropriate for this patient at this time.  All issues noted in this document were discussed and addressed.  No physical exam was performed.    I discussed the limitations, risks, security and privacy concerns of performing an evaluation and management service by telephone and the availability of in person appointments. I also discussed with the patient that there may be a patient responsible charge related to this service. The patient expressed understanding and agreed to proceed.   Date:  01/25/2019    ID:  [name]      1946-06-17        [id number]   Patient Care Team Patient Care Team: Chipper Herb, MD as PCP - General (Family Medicine) Gaynelle Arabian, MD as Consulting Physician (Orthopedic Surgery) Suella Broad, MD as Consulting Physician (Physical Medicine and Rehabilitation) Druscilla Brownie, MD as Consulting Physician (Dermatology) Dyke Maes, OD as Consulting Physician (Optometry) Inda Castle, MD (Inactive) as Consulting Physician (Gastroenterology)  Reason for Visit: Primary Care Follow-up     History of Present Illness & Review of Systems:     Marissa Perez is a 73 y.o. year old female primary care patient that presents today for a telehealth visit.  Patient is pleasant and doing well.  She has been dealing with health issues with her husband recently.  Her eye exam and breast exams have been postponed but we will get on to that as soon  as possible according to the patient.  She denies any chest pain pressure tightness or shortness of breath.  She denies any trouble with swallowing heartburn indigestion nausea vomiting diarrhea or blood in the stool.  She is passing her water well.  She is not due a colonoscopy until 2025 by Dr. Deatra Ina.  She has not had any further falls.  Review of systems as stated, otherwise negative.  The patient does not have symptoms concerning for COVID-19 infection (fever, chills, cough, or new shortness of breath).      Current Medications (Verified) Allergies as of 01/25/2019      Reactions   Codeine    Headache    Femring [estradiol] Rash   Cephalexin Rash   Erythromycin Other (See Comments)   Gi  Upset    Penicillins Rash      Medication List       Accurate as of January 25, 2019  1:25 PM. If you have any questions, ask your nurse or doctor.        atorvastatin 20 MG tablet Commonly known as: LIPITOR Take 1 tablet (20 mg total) by mouth daily.   B-12 1000 MCG Subl Place under the tongue.   BIOTIN PO Take by mouth.   Calcium Citrate-Vitamin D 315-250 MG-UNIT Tabs Commonly known as: Calcium Citrate + D3 Maximum Take 1 tablet by mouth 2 (two) times daily.   clobetasol 0.05 % external solution Commonly known as: TEMOVATE Apply 1 application topically daily as needed.   ezetimibe 10 MG tablet Commonly known  as: ZETIA TAKE 1 TABLET DAILY   gabapentin 100 MG capsule Commonly known as: NEURONTIN Take 100 mg in the AM, 100 mg at lunch and 200mg  capsules at bedtime What changed: additional instructions   hydrochlorothiazide 12.5 MG capsule Commonly known as: MICROZIDE TAKE (1) CAPSULE DAILY   levothyroxine 50 MCG tablet Commonly known as: SYNTHROID TAKE 1/2 TAB ON MON, WED, FRI & 1 TAB ON TUES AND THURS What changed: additional instructions   meloxicam 15 MG tablet Commonly known as: MOBIC Take 1 tablet (15 mg total) by mouth daily.   MULTI-VITAMIN PO Take by mouth  daily.   omeprazole 20 MG capsule Commonly known as: PRILOSEC Take 20 mg by mouth daily.   OVER THE COUNTER MEDICATION Take 4 capsules by mouth daily. JR Carlson's Elite Omega Fish oil   PROBIOTIC DAILY PO Take by mouth.   valsartan 160 MG tablet Commonly known as: DIOVAN Take 1 tablet (160 mg total) by mouth daily. As directed   VITAMIN D PO Take 5,000 Units by mouth daily.           Allergies (Verified)    Codeine, Femring [estradiol], Cephalexin, Erythromycin, and Penicillins  Past Medical History Past Medical History:  Diagnosis Date  . Acid reflux   . Allergy   . Cancer ([place])    SKIN  . Diverticulosis   . History of ETT 7/10   abnormal   . History of stomach ulcers 2008   positive h. pylori  . Hyperlipidemia   . Hypertension   . Hypothyroidism   . IBS (irritable bowel syndrome)      Past Surgical History:  Procedure Laterality Date  . [place]  . MOHS SURGERY    . TONSILLECTOMY  1951  . TOTAL ABDOMINAL HYSTERECTOMY W/ BILATERAL SALPINGOOPHORECTOMY  1990   Dr. Tamala Julian / fibroids     Social History   Socioeconomic History  . Marital status: Married    Spouse name: Not on file  . Number of children: 1  . Years of education: 67  . Highest education level: Bachelor's degree (e.g., BA, AB, BS)  Occupational History  . 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 Financial resource strain: Not hard at all  . Food insecurity    Worry: Never true    Inability: Never true  . Transportation needs    Medical: No    Non-medical: No  Tobacco Use  . Smoking status: Never Smoker  . Smokeless tobacco: Never Used  Substance and Sexual Activity  . Alcohol use: Yes    Alcohol/week: 14.0 standard drinks    Types: 14 Glasses of wine per week  . Drug use: No  . Sexual activity: Never  Lifestyle  . Physical activity    Days per week: 0 days    Minutes per session: 0 min  . Stress: Only a little   Relationships  . Social connections    Talks on phone: More than three times a week    Gets together: More than three times a week    Attends religious service: More than 4 times per year    Active member of club or organization: Yes    Attends meetings of clubs or organizations: More than 4 times per year    Relationship status: Married  Other Topics Concern  . Not on file  Social History Narrative  . Not on file     Family History  Problem Relation Age of Onset  . Diabetes Father   . Heart disease Father   . Heart attack Father 5  . Breast cancer Mother   . Breast cancer Paternal Grandmother        Aunt also   . Breast cancer Other        Family History  . Breast cancer Maternal Aunt   . Breast cancer Paternal Aunt   . Colon cancer Neg Hx       Labs/Other Tests and Data Reviewed:    Wt Readings from Last 3 Encounters:  09/21/18 139 lb (63 kg)  08/15/18 143 lb (64.9 kg)  06/24/18 143 lb (64.9 kg)   Temp Readings from Last 3 Encounters:  09/21/18 (!) 96.9 F (36.1 C) (Oral)  08/15/18 (!) 97 F (36.1 C) (Oral)  06/24/18 (!) 97.1 F (36.2 C)   BP Readings from Last 3 Encounters:  09/21/18 127/72  08/29/18 (!) 165/72  08/15/18 138/83   Pulse Readings from Last 3 Encounters:  09/21/18 67  08/29/18 73  08/15/18 77     No results found for: HGBA1C Lab Results  Component Value Date   LDLCALC 64 09/20/2018   CREATININE 0.70 09/20/2018       Chemistry      Component Value Date/Time   NA 132 (L) 09/20/2018 1324   K 4.7 09/20/2018 1324   CL 91 (L) 09/20/2018 1324   CO2 25 09/20/2018 1324   BUN 13 09/20/2018 1324   CREATININE 0.70 09/20/2018 1324   CREATININE 0.75 11/07/2012 0844      Component Value Date/Time   CALCIUM 9.5 09/20/2018 1324   ALKPHOS 64 09/20/2018 1324   AST 23 09/20/2018 1324   ALT 31 09/20/2018 1324   BILITOT 0.3 09/20/2018 1324         OBSERVATIONS/ OBJECTIVE:     The patient plans to schedule her eye exam with Dr.  Carolynn Sayers.  Her weight is 139 and seems to fluctuate between 135 and 140.  She is not running any fever.  She is not had any recent falls.  She plans to reschedule the mammogram that was postponed due to COVID-19.  Physical exam deferred due to nature of telephonic visit.  ASSESSMENT & PLAN    Time:   Today, I have spent 30 minutes with the patient via telephone discussing the above including Covid precautions.     Visit Diagnoses: 1. Essential hypertension -Tinea current treatment pending results of lab work  2. Aortic atherosclerosis ([place]) -Tinea aggressive therapeutic lifestyle changes making all efforts to watch cholesterol intake exercising regularly and reducing weight  3. Gastroesophageal reflux disease, esophagitis presence not specified -Watch diet closely be careful with NSAIDs and continue with omeprazole daily before breakfast  4. Hypothyroidism due to acquired atrophy of thyroid -Continue current treatment pending results of lab work  5. Lumbar spondylosis -Follow-up with orthopedics as planned and as needed  6. Mixed hyperlipidemia -Continue current treatment and change from [place] oil to [employer] if it is financially not too expensive.  7. Family history of breast cancer -Mammogram as planned  8. Vitamin D deficiency -Continue with vitamin D replacement plus calcium and remember that weightbearing exercise improves bone health  9. Primary osteoarthritis involving multiple joints -Take Tylenol if needed for pain and avoid taking meloxicam any more often than necessary.  Remember that NSAIDs like meloxicam increase blood pressure damage kidneys irritate the stomach and affect the liver.  Patient Instructions  Continue with routine preventative  care Continue to practice good hand and respiratory hygiene in the midst of COVID-19 Follow-up with orthopedist as needed and as planned Continue to get mammograms yearly and check breast regularly Next colonoscopy  would be in 2025 unless problems develop sooner like GI bleeding or change in bowel habits.  Check FOBT yearly Get eye exam Check blood pressures a little more frequently 3-4 times monthly. Continue to watch sodium intake     The above assessment and management plan was discussed with the patient. The patient verbalized understanding of and has agreed to the management plan. Patient is aware to call the clinic if symptoms persist or worsen. Patient is aware when to return to the clinic for a follow-up visit. Patient educated on when it is appropriate to go to the emergency department.    Chipper Herb, MD [facility] [address], [place], [place] [address] Ph [phone]   Arrie Senate MD

## 2019-01-25 NOTE — Patient Instructions (Addendum)
Continue with routine preventative care Continue to practice good hand and respiratory hygiene in the midst of COVID-19 Follow-up with orthopedist as needed and as planned Continue to get mammograms yearly and check breast regularly Next colonoscopy would be in 2025 unless problems develop sooner like GI bleeding or change in bowel habits.  Check FOBT yearly Get eye exam Check blood pressures a little more frequently 3-4 times monthly. Continue to watch sodium intake

## 2019-01-29 ENCOUNTER — Telehealth: Payer: Self-pay | Admitting: Family Medicine

## 2019-01-29 DIAGNOSIS — I10 Essential (primary) hypertension: Secondary | ICD-10-CM

## 2019-01-29 DIAGNOSIS — E034 Atrophy of thyroid (acquired): Secondary | ICD-10-CM

## 2019-01-29 DIAGNOSIS — E782 Mixed hyperlipidemia: Secondary | ICD-10-CM

## 2019-01-29 NOTE — Telephone Encounter (Signed)
Labs placed as patient requested.

## 2019-01-30 ENCOUNTER — Other Ambulatory Visit: Payer: Self-pay | Admitting: Family Medicine

## 2019-02-01 ENCOUNTER — Other Ambulatory Visit: Payer: Self-pay

## 2019-02-01 ENCOUNTER — Other Ambulatory Visit: Payer: Medicare Other

## 2019-02-01 DIAGNOSIS — E782 Mixed hyperlipidemia: Secondary | ICD-10-CM

## 2019-02-01 DIAGNOSIS — E034 Atrophy of thyroid (acquired): Secondary | ICD-10-CM

## 2019-02-01 DIAGNOSIS — I10 Essential (primary) hypertension: Secondary | ICD-10-CM

## 2019-02-02 ENCOUNTER — Other Ambulatory Visit: Payer: Self-pay | Admitting: *Deleted

## 2019-02-02 LAB — LIPID PANEL
Chol/HDL Ratio: 2.1 ratio (ref 0.0–4.4)
Cholesterol, Total: 158 mg/dL (ref 100–199)
HDL: 76 mg/dL (ref >39)
LDL Calculated: 55 mg/dL (ref 0–99)
Triglycerides: 134 mg/dL (ref 0–149)
VLDL Cholesterol Cal: 27 mg/dL (ref 5–40)

## 2019-02-02 LAB — BMP8+EGFR
BUN/Creatinine Ratio: 16 (ref 12–28)
BUN: 11 mg/dL (ref 8–27)
CO2: 21 mmol/L (ref 20–29)
Calcium: 10 mg/dL (ref 8.7–10.3)
Chloride: 94 mmol/L — ABNORMAL LOW (ref 96–106)
Creatinine, Ser: 0.67 mg/dL (ref 0.57–1.00)
GFR calc Af Amer: 101 mL/min/{1.73_m2} (ref >59)
GFR calc non Af Amer: 87 mL/min/{1.73_m2} (ref >59)
Glucose: 90 mg/dL (ref 65–99)
Potassium: 4.2 mmol/L (ref 3.5–5.2)
Sodium: 133 mmol/L — ABNORMAL LOW (ref 134–144)

## 2019-02-02 LAB — CBC WITH DIFFERENTIAL/PLATELET
Basophils Absolute: 0.1 10*3/uL (ref 0.0–0.2)
Basos: 1 %
EOS (ABSOLUTE): 0.1 10*3/uL (ref 0.0–0.4)
Eos: 2 %
Hematocrit: 36.5 % (ref 34.0–46.6)
Hemoglobin: 12.7 g/dL (ref 11.1–15.9)
Immature Grans (Abs): 0 10*3/uL (ref 0.0–0.1)
Immature Granulocytes: 0 %
Lymphocytes Absolute: 1.6 10*3/uL (ref 0.7–3.1)
Lymphs: 38 %
MCH: 32.2 pg (ref 26.6–33.0)
MCHC: 34.8 g/dL (ref 31.5–35.7)
MCV: 93 fL (ref 79–97)
Monocytes Absolute: 0.4 10*3/uL (ref 0.1–0.9)
Monocytes: 8 %
Neutrophils Absolute: 2.1 10*3/uL (ref 1.4–7.0)
Neutrophils: 51 %
Platelets: 340 10*3/uL (ref 150–450)
RBC: 3.94 x10E6/uL (ref 3.77–5.28)
RDW: 11.9 % (ref 11.7–15.4)
WBC: 4.2 10*3/uL (ref 3.4–10.8)

## 2019-02-02 LAB — THYROID PANEL WITH TSH
Free Thyroxine Index: 2 (ref 1.2–4.9)
T3 Uptake Ratio: 31 % (ref 24–39)
T4, Total: 6.3 ug/dL (ref 4.5–12.0)
TSH: 3.34 u[IU]/mL (ref 0.450–4.500)

## 2019-02-02 MED ORDER — VASCEPA 1 G PO CAPS: 2.0000 | ORAL_CAPSULE | Freq: Two times a day (BID) | ORAL | 3 refills | Status: DC

## 2019-02-09 ENCOUNTER — Telehealth: Payer: Self-pay | Admitting: *Deleted

## 2019-02-09 ENCOUNTER — Other Ambulatory Visit: Payer: Self-pay | Admitting: Family Medicine

## 2019-02-09 MED ORDER — LOSARTAN POTASSIUM 50 MG PO TABS: 50.0000 mg | ORAL_TABLET | Freq: Every day | ORAL | 3 refills | Status: DC

## 2019-02-09 NOTE — Telephone Encounter (Signed)
Valsartan is on back order Please prescribe alternative

## 2019-02-09 NOTE — Telephone Encounter (Signed)
Please have patient come in for BP check w/ RN in 2 weeks.  Med switched to Losartan 50mg  daily.

## 2019-02-09 NOTE — Telephone Encounter (Signed)
Pt notified of new RX Pt will come in for BP check in 2 wks

## 2019-02-26 ENCOUNTER — Encounter: Payer: Self-pay | Admitting: Family Medicine

## 2019-02-26 ENCOUNTER — Other Ambulatory Visit: Payer: Self-pay

## 2019-02-27 ENCOUNTER — Ambulatory Visit (INDEPENDENT_AMBULATORY_CARE_PROVIDER_SITE_OTHER): Payer: Medicare Other | Admitting: *Deleted

## 2019-02-27 VITALS — BP 128/73

## 2019-02-27 DIAGNOSIS — I10 Essential (primary) hypertension: Secondary | ICD-10-CM

## 2019-02-27 NOTE — Progress Notes (Signed)
Pt came in for BP check. 128/73

## 2019-02-28 ENCOUNTER — Ambulatory Visit
Admission: RE | Admit: 2019-02-28 | Discharge: 2019-02-28 | Disposition: A | Discharge status: Home / Self Care | Payer: Medicare Other | Source: Ambulatory Visit | Attending: Family Medicine | Admitting: Family Medicine

## 2019-02-28 ENCOUNTER — Other Ambulatory Visit: Payer: Self-pay

## 2019-02-28 DIAGNOSIS — Z1231 Encounter for screening mammogram for malignant neoplasm of breast: Secondary | ICD-10-CM

## 2019-03-05 ENCOUNTER — Encounter: Payer: Self-pay | Admitting: Family Medicine

## 2019-04-10 ENCOUNTER — Other Ambulatory Visit: Payer: Self-pay | Admitting: Family Medicine

## 2019-04-26 ENCOUNTER — Encounter: Payer: Self-pay | Admitting: Family Medicine

## 2019-04-30 ENCOUNTER — Other Ambulatory Visit: Payer: Self-pay | Admitting: *Deleted

## 2019-04-30 ENCOUNTER — Other Ambulatory Visit: Payer: Self-pay | Admitting: Family Medicine

## 2019-04-30 DIAGNOSIS — E559 Vitamin D deficiency, unspecified: Secondary | ICD-10-CM

## 2019-04-30 DIAGNOSIS — E871 Hypo-osmolality and hyponatremia: Secondary | ICD-10-CM

## 2019-05-04 ENCOUNTER — Ambulatory Visit: Payer: Medicare Other | Admitting: Family Medicine

## 2019-05-04 ENCOUNTER — Encounter: Payer: Self-pay | Admitting: Family Medicine

## 2019-05-04 ENCOUNTER — Ambulatory Visit (HOSPITAL_COMMUNITY)
Admission: RE | Admit: 2019-05-04 | Discharge: 2019-05-04 | Disposition: A | Discharge status: Home / Self Care | Payer: Medicare Other | Source: Ambulatory Visit | Attending: Family Medicine | Admitting: Family Medicine

## 2019-05-04 ENCOUNTER — Other Ambulatory Visit: Payer: Self-pay

## 2019-05-04 VITALS — BP 160/77 | HR 80 | Temp 97.3°F | Ht 62.0 in | Wt 144.0 lb

## 2019-05-04 DIAGNOSIS — W19XXXA Unspecified fall, initial encounter: Secondary | ICD-10-CM | POA: Diagnosis not present

## 2019-05-04 DIAGNOSIS — E871 Hypo-osmolality and hyponatremia: Secondary | ICD-10-CM

## 2019-05-04 DIAGNOSIS — Z1211 Encounter for screening for malignant neoplasm of colon: Secondary | ICD-10-CM

## 2019-05-04 DIAGNOSIS — R519 Headache, unspecified: Secondary | ICD-10-CM | POA: Insufficient documentation

## 2019-05-04 DIAGNOSIS — E782 Mixed hyperlipidemia: Secondary | ICD-10-CM

## 2019-05-04 DIAGNOSIS — I10 Essential (primary) hypertension: Secondary | ICD-10-CM | POA: Diagnosis not present

## 2019-05-04 DIAGNOSIS — S0003XA Contusion of scalp, initial encounter: Secondary | ICD-10-CM | POA: Insufficient documentation

## 2019-05-04 DIAGNOSIS — S0990XA Unspecified injury of head, initial encounter: Secondary | ICD-10-CM | POA: Diagnosis present

## 2019-05-04 DIAGNOSIS — Z23 Encounter for immunization: Secondary | ICD-10-CM

## 2019-05-04 DIAGNOSIS — E559 Vitamin D deficiency, unspecified: Secondary | ICD-10-CM

## 2019-05-04 MED ORDER — GABAPENTIN 100 MG PO CAPS: ORAL_CAPSULE | ORAL | 1 refills | Status: DC

## 2019-05-04 MED ORDER — VASCEPA 1 G PO CAPS: 2.0000 | ORAL_CAPSULE | Freq: Two times a day (BID) | ORAL | 2 refills | Status: DC

## 2019-05-04 MED ORDER — ATORVASTATIN CALCIUM 20 MG PO TABS: 20.0000 mg | ORAL_TABLET | Freq: Every day | ORAL | 3 refills | Status: DC

## 2019-05-04 MED ORDER — LOSARTAN POTASSIUM 50 MG PO TABS: 50.0000 mg | ORAL_TABLET | Freq: Every day | ORAL | 3 refills | Status: DC

## 2019-05-04 MED ORDER — EZETIMIBE 10 MG PO TABS: 10.0000 mg | ORAL_TABLET | Freq: Every day | ORAL | Status: DC

## 2019-05-04 NOTE — Patient Instructions (Signed)
I have ordered a stat CT scan of your head.  Your exam showed a soft tissue swelling with possible bony defect at the bottom.     Head Injury, Adult There are many types of head injuries. They can be as minor as a bump. Some head injuries can be worse. Worse injuries include:  A strong hit to the head that shakes the brain back and forth causing damage (concussion).  A bruise (contusion) of the brain. This means there is bleeding in the brain that can cause swelling.  A cracked skull (skull fracture).  Bleeding in the brain that gathers, gets thick (makes a clot), and forms a bump (hematoma). Most problems from a head injury come in the first 24 hours. However, you may still have side effects up to 7-10 days after your injury. It is important to watch your condition for any changes. You may need to be watched in the emergency department or urgent care, or you may need to stay in the hospital. What are the causes? There are many possible causes of a head injury. A serious head injury may be caused by:  A car accident.  Bicycle or motorcycle accidents.  Sports injuries.  Falls. What are the signs or symptoms? Symptoms of a head injury include a bruise, bump, or bleeding where the injury happened. Other physical symptoms may include:  Headache.  Feeling sick to your stomach (nauseous) or vomiting.  Dizziness.  Feeling tired.  Being uncomfortable around bright lights or loud noises.  Shaking movements that you cannot control (seizures).  Trouble being woken up.  Passing out (fainting). Mental or emotional symptoms may include:  Feeling grumpy or cranky.  Confusion and memory problems.  Having trouble paying attention or concentrating.  Changes in eating or sleeping habits.  Feeling worried or nervous (anxious).  Feeling sad (depressed). How is this treated? Treatment for this condition depends on how severe the injury is and the type of injury you have. The main  goal is to prevent complications and to allow the brain time to heal. Mild head injury If you have a mild head injury, you may be sent home and treatment may include:  Being watched. A responsible adult should stay with you for 24 hours after your injury and check on you often.  Physical rest.  Brain rest.  Pain medicines. Severe head injury If you have a severe head injury, treatment may include:  Being watched closely. This includes hospitalization with frequent physical exams.  Medicines to: ? Help with pain. ? Prevent shaking movements that you cannot control. ? Help with brain swelling.  Using a machine that helps you breathe (ventilator).  Treatments to manage the swelling inside the brain.  Brain surgery. This may be needed to: ? Remove a blood clot. ? Stop the bleeding. ? Remove a part of the skull. This allows room for the brain to swell. Follow these instructions at home: Activity  Rest.  Avoid activities that are hard or tiring.  Make sure you get enough sleep.  Limit activities that need a lot of thought or attention, such as: ? Watching TV. ? Playing memory games and puzzles. ? Job-related work or homework. ? Working on [employer], and texting.  Avoid activities that could cause another head injury until your doctor says it is okay. This includes playing sports. Having another head injury, especially before the first one has healed, can be dangerous.  Ask your doctor when it is safe for you  to go back to your normal activities, such as work or school. Ask your doctor for a step-by-step plan for slowly going back to your normal activities.  Ask your doctor when you can drive, ride a bicycle, or use heavy machinery. Do not do these activities if you are dizzy. Lifestyle   Do not drink alcohol until your doctor says it is okay.  Do not use drugs.  If it is harder than usual to remember things, write them down.  If you are easily  distracted, try to do one thing at a time.  Talk with family members or close friends when making important decisions.  Tell your friends, family, a trusted coworker, and work Freight forward[employer] about your injury, symptoms, and limits (restrictions). Have them watch for any problems that are new or getting worse. General instructions  Take over-the-counter and prescription medicines only as told by your doctor.  Have someone stay with you for 24 hours after your head injury. This person should watch you for any changes in your symptoms and be ready to get help.  Keep all follow-up visits as told by your doctor. This is important. How is this prevented?  Work on [employer]. This can help you avoid falls.  Wear a seatbelt when you are in a moving vehicle.  Wear a helmet when you: ? Ride a bicycle. ? Ski. ? Do any other sport or activity that has a risk of injury.  If you drink alcohol: ? Limit how much you use to: ? 0-1 drink a day for women. ? 0-2 drinks a day for men. ? Be aware of how much alcohol is in your drink. In the [place] one drink equals one 12 oz bottle of beer (355 mL), one 5 oz glass of wine (148 mL), or one 1 oz glass of hard liquor (44 mL).  Make your home safer by: ? Getting rid of clutter from the floors and stairs. This includes things that can make you trip. ? Using grab bars in bathrooms and handrails by stairs. ? Placing non-slip mats on floors and in bathtubs. ? Putting more light in dim areas. Get help right away if:  You have: ? A very bad headache that is not helped by medicine. ? Trouble walking or weakness in your arms and legs. ? Clear or bloody fluid coming from your nose or ears. ? Changes in how you see (vision). ? Shaking movements that you cannot control.  You lose your balance.  You vomit.  The black centers of your eyes (pupils) change in size.  Your speech is slurred.  Your dizziness gets worse.  You pass out.  You are  sleepier than normal and have trouble staying awake.  Your symptoms get worse. These symptoms may be an emergency. Do not wait to see if the symptoms will go away. Get medical help right away. Call your local emergency services (911 in the [place] Do not drive yourself to the hospital. Summary  There are many types of head injuries. They can be as minor as a bump. Some head injuries can be worse  Treatment for this condition depends on how severe the injury is and the type of injury you have.  Ask your doctor when it is safe for you to go back to your normal activities, such as work or school.  To prevent a head injury, wear a seat belt in a car, wear a helmet when you use a a bicycle, limit your alcohol  use, and make your home safer. This information is not intended to replace advice given to you by your health care provider. Make sure you discuss any questions you have with your health care provider. Document Released: 07/01/2008 Document Revised: 11/09/2018 Document Reviewed: 08/11/2018 Elsevier Patient Education  2020 [employer].

## 2019-05-04 NOTE — Progress Notes (Signed)
Subjective: CC: Fall PCP: [name], DO AL:[id number] R Marissa Perez is a 73 y.o. female presenting to clinic today for:  1.  Fall Patient reports that she sustained a mechanical fall about 1 hour ago.  She was pulling on a water hose when she tripped over a tree stump and fell backwards.  She hit her head on the sidewalk.  She denies any current dizziness, visual disturbance, balance issues, difficulty with speech or confusion.  Her husband witnessed the fall.  She is not on any anticoagulation.  She does have some pain posteriorly where she hit her head.  2.  Hypertension with hyperlipidemia Reports compliance with medications.  No chest pain, shortness of breath, lower extremity edema or dizziness.  Needs refills   ROS: Per HPI  Allergies  Allergen Reactions  . Codeine     Headache    . Femring [Estradiol] Rash  . Cephalexin Rash  . Erythromycin Other (See Comments)    Gi  Upset    . Penicillins Rash   Past Medical History:  Diagnosis Date  . Acid reflux   . Allergy   . Cancer ([place])    SKIN  . Diverticulosis   . History of ETT 7/10   abnormal   . History of stomach ulcers 2008   positive h. pylori  . Hyperlipidemia   . Hypertension   . Hypothyroidism   . IBS (irritable bowel syndrome)     Current Outpatient Medications:  .  atorvastatin (LIPITOR) 20 MG tablet, Take 1 tablet (20 mg total) by mouth daily., Disp: 90 tablet, Rfl: 3 .  BIOTIN PO, Take by mouth.  , Disp: , Rfl:  .  Calcium Citrate-Vitamin D (CALCIUM CITRATE + D3 MAXIMUM) 315-250 MG-UNIT TABS, Take 1 tablet by mouth 2 (two) times daily., Disp: 120 tablet, Rfl:  .  Cholecalciferol (VITAMIN D PO), Take 5,000 Units by mouth daily., Disp: , Rfl:  .  clobetasol (TEMOVATE) 0.05 % external solution, Apply 1 application topically daily as needed. , Disp: , Rfl:  .  Cyanocobalamin (B-12) 1000 MCG SUBL, Place under the tongue., Disp: , Rfl:  .  ezetimibe (ZETIA) 10 MG tablet, TAKE 1 TABLET DAILY, Disp: 90  tablet, Rfl: 1 .  gabapentin (NEURONTIN) 100 MG capsule, Take 100 mg in the AM, 100 mg at lunch and 200mg  capsules at bedtime (Patient taking differently: Take 100 mg in the AM and 200mg  capsules at bedtime), Disp: 360 capsule, Rfl: 3 .  hydrochlorothiazide (MICROZIDE) 12.5 MG capsule, TAKE (1) CAPSULE DAILY, Disp: 90 capsule, Rfl: 0 .  Icosapent Ethyl (VASCEPA) 1 g CAPS, Take 2 capsules (2 g total) by mouth 2 (two) times daily., Disp: 120 capsule, Rfl: 3 .  levothyroxine (SYNTHROID) 50 MCG tablet, TAKE 1/2 TAB ON MON, WED, FRI & 1 TAB ON TUES AND THURS, SAT, Disp: 90 tablet, Rfl: 0 .  losartan (COZAAR) 50 MG tablet, Take 1 tablet (50 mg total) by mouth daily., Disp: 90 tablet, Rfl: 3 .  meloxicam (MOBIC) 15 MG tablet, Take 1 tablet (15 mg total) by mouth daily., Disp: 90 tablet, Rfl: 3 .  Multiple Vitamin (MULTI-VITAMIN PO), Take by mouth daily.  , Disp: , Rfl:  .  omeprazole (PRILOSEC) 20 MG capsule, Take 20 mg by mouth daily., Disp: , Rfl:  .  OVER THE COUNTER MEDICATION, Take 4 capsules by mouth daily. JR Carl[employer]'s Elite Omega Fish oil, Disp: , Rfl:  .  Probiotic Product (PROBIOTIC DAILY PO), Take by mouth., Disp: ,  Rfl:  Social History   Socioeconomic History  . Marital status: Married    Spouse name: Not on file  . Number of children: 1  . Years of education: 70  . Highest education level: Bachelor's degree (e.g., BA, AB, BS)  Occupational History  . Occupation: Retired    Fish farm manager: [employer]  Social Needs  . Financial resource strain: Not hard at all  . Food insecurity    Worry: Never true    Inability: Never true  . Transportation needs    Medical: No    Non-medical: No  Tobacco Use  . Smoking status: Never Smoker  . Smokeless tobacco: Never Used  Substance and Sexual Activity  . Alcohol use: Yes    Alcohol/week: 14.0 standard drinks    Types: 14 Glasses of wine per week  . Drug use: No  . Sexual activity: Never  Lifestyle  . Physical  activity    Days per week: 0 days    Minutes per session: 0 min  . Stress: Only a little  Relationships  . Social connections    Talks on phone: More than three times a week    Gets together: More than three times a week    Attends religious service: More than 4 times per year    Active member of club or organization: Yes    Attends meetings of clubs or organizations: More than 4 times per year    Relationship status: Married  . Intimate partner violence    Fear of current or ex partner: No    Emotionally abused: No    Physically abused: No    Forced sexual activity: No  Other Topics Concern  . Not on file  Social History Narrative  . Not on file   Family History  Problem Relation Age of Onset  . Diabetes Father   . Heart disease Father   . Heart attack Father 20  . Breast cancer Mother   . Breast cancer Paternal Grandmother        Aunt also   . Breast cancer Other        Family History  . Breast cancer Maternal Aunt   . Breast cancer Paternal Aunt   . Colon cancer Neg Hx     Objective: Office vital signs reviewed. BP (!) 169/93   Pulse 80   Temp (!) 97.3 F (36.3 C) (Temporal)   Ht 5\' 2"  (1.575 m)   Wt 144 lb (65.3 kg)   SpO2 99%   BMI 26.34 kg/m   Physical Examination:  General: Awake, alert, well nourished, No acute distress HEENT: Softball sized soft tissue swelling with associated erythema noted along the right posterior occipital area.  At the 7 o'clock position there is a palpable bony defect.  Given extent of soft tissue swelling difficult to tell this is normal variant.    Eyes: PERRLA, extraocular membranes intact, sclera white Cardio: regular rate and rhythm, S1S2 heard, no murmurs appreciated Pulm: clear to auscultation bilaterally, no wheezes, rhonchi or rales; normal work of breathing on room air Skin: dry; intact; no rashes or lesions Neuro: Cranial nerves II through XII grossly intact.  Alert and oriented x3.  No focal neurologic deficits  identified  Assessment/ Plan: 73 y.o. female   1. Injury of head, initial encounter She has a large, softball area of soft tissue swelling and erythema.  At the 7 o'clock position of the soft tissue swelling there is a mild  appreciable defect.  Given the extent of the soft tissue swelling is very difficult to tell if this is a normal variant.  Given her recent fall have gone ahead and ordered a CT head to rule out skull fracture.  Her neurologic exam is totally normal here in office. - CT Head Wo Contrast; Future  2. Fall, initial encounter Mechanical - CT Head Wo Contrast; Future  3. Essential hypertension Not controlled but likely due to pain and recent fall.  No changes  4. Mixed hyperlipidemia Continue statin, zetia and vascepa   Orders Placed This Encounter  Procedures  . CT Head Wo Contrast    Standing Status:   Future    Standing Expiration Date:   08/03/2020    Order Specific Question:   Preferred imaging location?    Answer:   [facility]    Order Specific Question:   Radiology Contrast Protocol - do NOT remove file path    Answer:   [email or web address]   Meds ordered this encounter  Medications  . atorvastatin (LIPITOR) 20 MG tablet    Sig: Take 1 tablet (20 mg total) by mouth daily.    Dispense:  90 tablet    Refill:  3  . ezetimibe (ZETIA) 10 MG tablet    Sig: Take 1 tablet (10 mg total) by mouth daily.    Dispense:  90 tablet    Refill:  01  . gabapentin (NEURONTIN) 100 MG capsule    Sig: Take 100 mg in the AM and 200mg  capsules at bedtime    Dispense:  360 capsule    Refill:  1  . Icosapent Ethyl (VASCEPA) 1 g CAPS    Sig: Take 2 capsules (2 g total) by mouth 2 (two) times daily.    Dispense:  120 capsule    Refill:  2  . losartan (COZAAR) 50 MG tablet    Sig: Take 1 tablet (50 mg total) by mouth daily.    Dispense:  90 tablet    Refill:  [place], Daphne [phone]

## 2019-05-05 LAB — BASIC METABOLIC PANEL
BUN/Creatinine Ratio: 19 (ref 12–28)
BUN: 15 mg/dL (ref 8–27)
CO2: 21 mmol/L (ref 20–29)
Calcium: 9.6 mg/dL (ref 8.7–10.3)
Chloride: 90 mmol/L — ABNORMAL LOW (ref 96–106)
Creatinine, Ser: 0.81 mg/dL (ref 0.57–1.00)
GFR calc Af Amer: 83 mL/min/{1.73_m2} (ref >59)
GFR calc non Af Amer: 72 mL/min/{1.73_m2} (ref >59)
Glucose: 106 mg/dL — ABNORMAL HIGH (ref 65–99)
Potassium: 5.1 mmol/L (ref 3.5–5.2)
Sodium: 129 mmol/L — ABNORMAL LOW (ref 134–144)

## 2019-05-05 LAB — VITAMIN D 25 HYDROXY (VIT D DEFICIENCY, FRACTURES): Vit D, 25-Hydroxy: 36.7 ng/mL (ref 30.0–100.0)

## 2019-05-07 ENCOUNTER — Other Ambulatory Visit: Payer: Self-pay

## 2019-05-07 ENCOUNTER — Other Ambulatory Visit: Payer: Medicare Other

## 2019-05-07 ENCOUNTER — Other Ambulatory Visit: Payer: Self-pay | Admitting: Family Medicine

## 2019-05-07 DIAGNOSIS — Z1211 Encounter for screening for malignant neoplasm of colon: Secondary | ICD-10-CM

## 2019-05-08 LAB — FECAL OCCULT BLOOD, IMMUNOCHEMICAL: Fecal Occult Bld: NEGATIVE

## 2019-06-13 ENCOUNTER — Other Ambulatory Visit: Payer: Self-pay | Admitting: Family Medicine

## 2019-07-31 ENCOUNTER — Other Ambulatory Visit: Payer: Self-pay

## 2019-07-31 ENCOUNTER — Ambulatory Visit: Payer: Medicare Other | Attending: Internal Medicine

## 2019-07-31 DIAGNOSIS — Z20822 Contact with and (suspected) exposure to covid-19: Secondary | ICD-10-CM

## 2019-08-01 LAB — NOVEL CORONAVIRUS, NAA: SARS-CoV-2, NAA: NOT DETECTED

## 2019-08-06 ENCOUNTER — Other Ambulatory Visit: Payer: Self-pay | Admitting: Family Medicine

## 2019-08-06 ENCOUNTER — Encounter: Payer: Self-pay | Admitting: Family Medicine

## 2019-08-06 DIAGNOSIS — E871 Hypo-osmolality and hyponatremia: Secondary | ICD-10-CM

## 2019-08-10 ENCOUNTER — Other Ambulatory Visit: Payer: Self-pay

## 2019-08-13 ENCOUNTER — Other Ambulatory Visit: Payer: Self-pay

## 2019-08-13 ENCOUNTER — Encounter: Payer: Self-pay | Admitting: Family Medicine

## 2019-08-13 ENCOUNTER — Ambulatory Visit: Payer: Medicare Other | Admitting: Family Medicine

## 2019-08-13 VITALS — BP 121/66 | HR 85 | Temp 97.5°F | Ht 62.0 in | Wt 139.0 lb

## 2019-08-13 DIAGNOSIS — G8929 Other chronic pain: Secondary | ICD-10-CM

## 2019-08-13 DIAGNOSIS — M25561 Pain in right knee: Secondary | ICD-10-CM

## 2019-08-13 DIAGNOSIS — E034 Atrophy of thyroid (acquired): Secondary | ICD-10-CM | POA: Diagnosis not present

## 2019-08-13 DIAGNOSIS — I10 Essential (primary) hypertension: Secondary | ICD-10-CM

## 2019-08-13 DIAGNOSIS — E871 Hypo-osmolality and hyponatremia: Secondary | ICD-10-CM

## 2019-08-13 NOTE — Patient Instructions (Signed)
Meniscus Tear    A meniscus tear is a knee injury that happens when a piece of the meniscus is torn. The meniscus is a thick, rubbery, wedge-shaped cartilage in the knee. Two menisci are located in each knee. They sit between the upper bone (femur) and lower bone (tibia) that make up the knee joint. Each meniscus acts as a shock absorber for the knee.  A torn meniscus is one of the most common types of knee injuries. This injury can range from mild to severe. Surgery may be needed to repair a severe tear.  What are the causes?  This condition may be caused by any kneeling, squatting, twisting, or pivoting movement. Sports-related injuries are the most common cause. These often occur from:  · Running and stopping suddenly.  ? Changing direction.  ? Being tackled or knocked off your feet.  · Lifting or carrying heavy weights.  As people get older, their menisci get thinner and weaker. In these people, tears can happen more easily, such as from climbing stairs.  What increases the risk?  You are more likely to develop this condition if you:  · Play contact sports.  · Have a job that requires kneeling or squatting.  · Are female.  · Are over 40 years old.  What are the signs or symptoms?  Symptoms of this condition include:  · Knee pain, especially at the side of the knee joint. You may feel pain when the injury occurs, or you may only hear a pop and feel pain later.  · A feeling that your knee is clicking, catching, locking, or giving way.  · Not being able to fully bend or extend your knee.  · Bruising or swelling in your knee.  How is this diagnosed?  This condition may be diagnosed based on your symptoms and a physical exam.  You may also have tests, such as:  · X-rays.  · MRI.  · A procedure to look inside your knee with a narrow surgical telescope (arthroscopy).  You may be referred to a knee specialist (orthopedic surgeon).  How is this treated?  Treatment for this injury depends on the severity of the tear.  Treatment for a mild tear may include:  · Rest.  · Medicine to reduce pain and swelling. This is usually a nonsteroidal anti-inflammatory drug (NSAID), like ibuprofen.  · A knee brace, sleeve, or wrap.  · Using crutches or a walker to keep weight off your knee and to help you walk.  · Exercises to strengthen your knee (physical therapy).  You may need surgery if you have a severe tear or if other treatments are not working.  Follow these instructions at home:  If you have a brace, sleeve, or wrap:  · Wear it as told by your health care provider. Remove it only as told by your health care provider.  · Loosen the brace, sleeve, or wrap if your toes tingle, become numb, or turn cold and blue.  · Keep the brace, sleeve, or wrap clean and dry.  · If the brace, sleeve, or wrap is not waterproof:  ? Do not let it get wet.  ? Cover it with a watertight covering when you take a bath or shower.  Managing pain and swelling    · Take over-the-counter and prescription medicines only as told by your health care provider.  · If directed, put ice on your knee:  ? If you have a removable brace, sleeve, or wrap, remove it   elevate) the injured area above the level of your heart while you are sitting or lying down. Activity  Do not use the injured limb to support your body weight until your health care provider says that you can. Use crutches or a walker as told by your health care provider.  Return to your normal activities as told by your health care provider. Ask your health care provider what activities are safe for you.  Perform range-of-motion exercises only as told by your health care provider.  Begin doing exercises to strengthen your knee and leg muscles only as told by your  health care provider. After you recover, your health care provider may recommend these exercises to help prevent another injury. General instructions  Use a knee brace, sleeve, or wrap as told by your health care provider.  Ask your health care provider when it is safe to drive if you have a brace, sleeve, or wrap on your knee.  Do not use any products that contain nicotine or tobacco, such as cigarettes, e-cigarettes, and chewing tobacco. If you need help quitting, ask your health care provider.  Ask your health care provider if the medicine prescribed to you: ? Requires you to avoid driving or using heavy machinery. ? Can cause constipation. You may need to take these actions to prevent or treat constipation:  Drink enough fluid to keep your urine pale yellow.  Take over-the-counter or prescription medicines.  Eat foods that are high in fiber, such as beans, whole grains, and fresh fruits and vegetables.  Limit foods that are high in fat and processed sugars, such as fried or sweet foods.  Keep all follow-up visits as told by your health care provider. This is important. Contact a health care provider if:  You have a fever.  Your knee becomes red, tender, or swollen.  Your pain medicine is not helping.  Your symptoms get worse or do not improve after 2 weeks of home care. Summary  A meniscus tear is a knee injury that happens when a piece of the meniscus is torn.  Treatment for this injury depends on the severity of the tear. You may need surgery if you have a severe tear or if other treatments are not working.  Rest, ice, and raise (elevate) your injured knee as told by your health care provider. This will help lessen pain and swelling.  Contact a health care provider if you have new symptoms, or your symptoms get worse or do not improve after 2 weeks of home care.  Keep all follow-up visits as told by your health care provider. This is important. This information is not  intended to replace advice given to you by your health care provider. Make sure you discuss any questions you have with your health care provider. Document Revised: 01/31/2018 Document Reviewed: 01/31/2018 Elsevier Patient Education  2020 [employer]  

## 2019-08-13 NOTE — Progress Notes (Signed)
Subjective: CC: Follow-up hypertension, hyponatremia PCP: [name], DO [id number] Marissa Perez is a 74 y.o. female presenting to clinic today for:  1.  Hypertension Patient was noted to be hyponatremic to 129 on her last metabolic panel.  She was instructed to discontinue use of her diuretic.  She has been monitoring her blood pressures which have been controlled at home since.  She reports compliance with her losartan 50 mg daily.  No chest pain, shortness of breath.  Dizziness has resolved.  No lower extremity edema.  2.  Right knee pain Patient reports several month history of right-sided knee pain.  She did have a fall in 2009 but this was more affecting the face rather than her knees.  No other known injury.  Over a year ago she had a corticosteroid injection administered by Dr. Wynelle Link here in the office.  She has a history of a Baker's cyst in this right knee.  She had imaging in March 2017 which showed mild degenerative changes.  She points to the anterior aspect of the knee as a source of pain.  Does not report any significant joint swelling.  She does report a clicking sensation in the knee and occasional pain, particularly with ambulating downhill.  Sometimes it feels unstable.  She takes meloxicam and tramadol as needed.  3.  Hypothyroidism Patient is compliant with her Synthroid 50 mcg daily.  She does not report any change in bowel habits, heart palpitation or change in weight.  ROS: Per HPI  Allergies  Allergen Reactions  . Codeine     Headache    . Femring [Estradiol] Rash  . Cephalexin Rash  . Erythromycin Other (See Comments)    Gi  Upset    . Penicillins Rash   Past Medical History:  Diagnosis Date  . Acid reflux   . Allergy   . Cancer ([place])    SKIN  . Diverticulosis   . History of ETT 7/10   abnormal   . History of stomach ulcers 2008   positive h. pylori  . Hyperlipidemia   . Hypertension   . Hypothyroidism   . IBS (irritable bowel syndrome)       Current Outpatient Medications:  .  atorvastatin (LIPITOR) 20 MG tablet, Take 1 tablet (20 mg total) by mouth daily., Disp: 90 tablet, Rfl: 3 .  BIOTIN PO, Take by mouth.  , Disp: , Rfl:  .  Calcium Citrate-Vitamin D (CALCIUM CITRATE + D3 MAXIMUM) 315-250 MG-UNIT TABS, Take 1 tablet by mouth 2 (two) times daily., Disp: 120 tablet, Rfl:  .  Cholecalciferol (VITAMIN D PO), Take 5,000 Units by mouth daily., Disp: , Rfl:  .  clobetasol (TEMOVATE) 0.05 % external solution, Apply 1 application topically daily as needed. , Disp: , Rfl:  .  Cyanocobalamin (B-12) 1000 MCG SUBL, Place under the tongue., Disp: , Rfl:  .  ezetimibe (ZETIA) 10 MG tablet, Take 1 tablet (10 mg total) by mouth daily., Disp: 90 tablet, Rfl: 01 .  gabapentin (NEURONTIN) 100 MG capsule, Take 100 mg in the AM and 200mg  capsules at bedtime, Disp: 360 capsule, Rfl: 1 .  hydrochlorothiazide (MICROZIDE) 12.5 MG capsule, TAKE (1) CAPSULE DAILY, Disp: 90 capsule, Rfl: 0 .  Icosapent Ethyl (VASCEPA) 1 g CAPS, Take 2 capsules (2 g total) by mouth 2 (two) times daily., Disp: 120 capsule, Rfl: 2 .  levothyroxine (SYNTHROID) 50 MCG tablet, TAKE 1/2 TAB ON MON, WED, FRI & 1 TAB ON TUES AND THURS, Disp:  90 tablet, Rfl: 2 .  losartan (COZAAR) 50 MG tablet, Take 1 tablet (50 mg total) by mouth daily., Disp: 90 tablet, Rfl: 3 .  meloxicam (MOBIC) 15 MG tablet, Take 1 tablet (15 mg total) by mouth daily., Disp: 90 tablet, Rfl: 3 .  Multiple Vitamin (MULTI-VITAMIN PO), Take by mouth daily.  , Disp: , Rfl:  .  omeprazole (PRILOSEC) 20 MG capsule, Take 20 mg by mouth daily., Disp: , Rfl:  .  OVER THE COUNTER MEDICATION, Take 4 capsules by mouth daily. JR Carl[employer]'s Elite Omega Fish oil, Disp: , Rfl:  .  Probiotic Product (PROBIOTIC DAILY PO), Take by mouth., Disp: , Rfl:  Social History   Socioeconomic History  . Marital status: Married    Spouse name: Not on file  . Number of children: 1  . Years of education: 68  . Highest education level:  Bachelor's degree (e.g., BA, AB, BS)  Occupational History  . 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 Smoking status: Never Smoker  . Smokeless tobacco: Never Used  Substance and Sexual Activity  . Alcohol use: Yes    Alcohol/week: 14.0 standard drinks    Types: 14 Glasses of wine per week  . Drug use: No  . Sexual activity: Never  Other Topics Concern  . Not on file  Social History Narrative  . Not on file   Social Determinants of Health   Financial Resource Strain:   . Difficulty of Paying Living Expenses: Not on file  Food Insecurity:   . Worried About Charity fundraiser in the Last Year: Not on file  . Ran Out of Food in the Last Year: Not on file  Transportation Needs:   . Lack of Transportation (Medical): Not on file  . Lack of Transportation (Non-Medical): Not on file  Physical Activity: Inactive  . Days of Exercise per Week: 0 days  . Minutes of Exercise per Session: 0 min  Stress:   . Feeling of Stress : Not on file  Social Connections:   . Frequency of Communication with Friends and Family: Not on file  . Frequency of Social Gatherings with Friends and Family: Not on file  . Attends Religious Services: Not on file  . Active Member of Clubs or Organizations: Not on file  . Attends Archivist Meetings: Not on file  . Marital Status: Not on file  Intimate Partner Violence:   . Fear of Current or Ex-Partner: Not on file  . Emotionally Abused: Not on file  . Physically Abused: Not on file  . Sexually Abused: Not on file   Family History  Problem Relation Age of Onset  . Diabetes Father   . Heart disease Father   . Heart attack Father 76  . Breast cancer Mother   . Breast cancer Paternal Grandmother        Aunt also   . Breast cancer Other        Family History  . Breast cancer Maternal Aunt   . Breast cancer Paternal Aunt   . Colon cancer Neg Hx     Objective: Office vital signs reviewed. BP  121/66   Pulse 85   Temp (!) 97.5 F (36.4 C) (Temporal)   Ht 5\' 2"  (1.575 m)   Wt 139 lb (63 kg)   SpO2 96%   BMI 25.42 kg/m   Physical Examination:  General: Awake, alert, well nourished, No acute  distress HEENT: Normal, no exophthalmos.  No goiter.  Alopecia noted. Cardio: regular rate and rhythm, S1S2 heard, no murmurs appreciated Pulm: clear to auscultation bilaterally, no wheezes, rhonchi or rales; normal work of breathing on room air Extremities: warm, well perfused, No edema, cyanosis or clubbing; +2 pulses bilaterally MSK: normal gait and station  Right knee: Patient has full active range of motion.  She does have some soft tissue swelling noted on the right compared to the left.  She has mild joint effusion noted.  No erythema, warmth.  No tenderness palpation to the patella, patellar tendon, quad tendon, joint line or posterior popliteal fossa.  No posterior popliteal masses palpated.  No ligamentous laxity.  No pain with [place]ssaly. Neuro: Lower extremity light sensation grossly intact  Assessment/ Plan: 74 y.o. female   1. Hyponatremia Recheck BMP given discontinuation of diuretic. - Basic Metabolic Panel  2. Essential hypertension Stable without diuretic.  No edema  3. Hypothyroidism due to acquired atrophy of thyroid Check thyroid panel - Thyroid Panel With TSH  4. Chronic pain of right knee I question meniscal injury given description of her pain.  However, Thessaly did not reproduce pain on exam.  I did offer corticosteroid injections today versus referral back to her orthopedist for formal evaluation.  She wants to get to the bottom of what is causing her symptoms and opted for the referral.  Referral has been placed.  I encouraged her to contact me if she needs anything else.  In the interim, consider Voltaren gel topically for intermittent pain. - Ambulatory referral to Orthopedic Surgery   Orders Placed This Encounter  Procedures  . Thyroid Panel With TSH    . Basic Metabolic Panel   No orders of the defined types were placed in this encounter.    [name], DO [place] [phone]

## 2019-08-14 LAB — THYROID PANEL WITH TSH
Free Thyroxine Index: 1.7 (ref 1.2–4.9)
T3 Uptake Ratio: 29 % (ref 24–39)
T4, Total: 5.9 ug/dL (ref 4.5–12.0)
TSH: 3.49 u[IU]/mL (ref 0.450–4.500)

## 2019-08-14 LAB — BASIC METABOLIC PANEL
BUN/Creatinine Ratio: 23 (ref 12–28)
BUN: 15 mg/dL (ref 8–27)
CO2: 21 mmol/L (ref 20–29)
Calcium: 9.9 mg/dL (ref 8.7–10.3)
Chloride: 99 mmol/L (ref 96–106)
Creatinine, Ser: 0.65 mg/dL (ref 0.57–1.00)
GFR calc Af Amer: 102 mL/min/{1.73_m2} (ref >59)
GFR calc non Af Amer: 88 mL/min/{1.73_m2} (ref >59)
Glucose: 98 mg/dL (ref 65–99)
Potassium: 4.2 mmol/L (ref 3.5–5.2)
Sodium: 137 mmol/L (ref 134–144)

## 2019-08-22 ENCOUNTER — Ambulatory Visit: Payer: Medicare PPO | Attending: Internal Medicine

## 2019-08-22 DIAGNOSIS — Z23 Encounter for immunization: Secondary | ICD-10-CM | POA: Insufficient documentation

## 2019-08-22 DIAGNOSIS — M25561 Pain in right knee: Secondary | ICD-10-CM | POA: Insufficient documentation

## 2019-08-22 NOTE — Progress Notes (Signed)
   Covid-19 Vaccination Clinic  Name:  Marissa Perez    MRN: [id number] DOB: 10-Dec-1945  08/22/2019  [name] was observed post Covid-19 immunization for 15 minutes without incidence. She was provided with Vaccine Information Sheet and instruction to access the V-Safe system.   [name] was instructed to call 911 with any severe reactions post vaccine: [name] Kitchen Difficulty breathing  . Swelling of your face and throat  . A fast heartbeat  . A bad rash all over your body  . Dizziness and weakness    Immunizations Administered    Name Date Dose VIS Date Route   Pfizer COVID-19 Vaccine 08/22/2019  4:36 PM 0.3 mL 07/13/2019 Intramuscular   Manufacturer: [employer], [employer]   Lot: [id number]   [place]: [id number]

## 2019-09-10 ENCOUNTER — Ambulatory Visit: Payer: Medicare PPO | Attending: Internal Medicine

## 2019-09-10 DIAGNOSIS — Z23 Encounter for immunization: Secondary | ICD-10-CM | POA: Insufficient documentation

## 2019-09-10 NOTE — Progress Notes (Signed)
   Covid-19 Vaccination Clinic  Name:  Marissa Perez    MRN: [id number] DOB: 27-Jun-1946  09/10/2019  [name] was observed post Covid-19 immunization for 15 minutes without incidence. She was provided with Vaccine Information Sheet and instruction to access the V-Safe system.   [name] was instructed to call 911 with any severe reactions post vaccine: [name] Kitchen Difficulty breathing  . Swelling of your face and throat  . A fast heartbeat  . A bad rash all over your body  . Dizziness and weakness    Immunizations Administered    Name Date Dose VIS Date Route   Pfizer COVID-19 Vaccine 09/10/2019 10:34 AM 0.3 mL 07/13/2019 Intramuscular   Manufacturer: [place]   Lot: [id number]   [place]: [id number]

## 2019-09-14 ENCOUNTER — Encounter: Payer: Self-pay | Admitting: Family Medicine

## 2019-10-01 DIAGNOSIS — M9903 Segmental and somatic dysfunction of lumbar region: Secondary | ICD-10-CM | POA: Diagnosis not present

## 2019-10-01 DIAGNOSIS — M9902 Segmental and somatic dysfunction of thoracic region: Secondary | ICD-10-CM | POA: Diagnosis not present

## 2019-10-01 DIAGNOSIS — M62830 Muscle spasm of back: Secondary | ICD-10-CM | POA: Diagnosis not present

## 2019-10-01 DIAGNOSIS — M9901 Segmental and somatic dysfunction of cervical region: Secondary | ICD-10-CM | POA: Diagnosis not present

## 2019-10-08 DIAGNOSIS — M9901 Segmental and somatic dysfunction of cervical region: Secondary | ICD-10-CM | POA: Diagnosis not present

## 2019-10-08 DIAGNOSIS — M9902 Segmental and somatic dysfunction of thoracic region: Secondary | ICD-10-CM | POA: Diagnosis not present

## 2019-10-08 DIAGNOSIS — M62830 Muscle spasm of back: Secondary | ICD-10-CM | POA: Diagnosis not present

## 2019-10-08 DIAGNOSIS — M9903 Segmental and somatic dysfunction of lumbar region: Secondary | ICD-10-CM | POA: Diagnosis not present

## 2019-10-09 ENCOUNTER — Other Ambulatory Visit: Payer: Self-pay | Admitting: Family Medicine

## 2019-10-18 DIAGNOSIS — M9903 Segmental and somatic dysfunction of lumbar region: Secondary | ICD-10-CM | POA: Diagnosis not present

## 2019-10-18 DIAGNOSIS — M9901 Segmental and somatic dysfunction of cervical region: Secondary | ICD-10-CM | POA: Diagnosis not present

## 2019-10-18 DIAGNOSIS — M9902 Segmental and somatic dysfunction of thoracic region: Secondary | ICD-10-CM | POA: Diagnosis not present

## 2019-10-18 DIAGNOSIS — M62830 Muscle spasm of back: Secondary | ICD-10-CM | POA: Diagnosis not present

## 2019-10-24 DIAGNOSIS — M62830 Muscle spasm of back: Secondary | ICD-10-CM | POA: Diagnosis not present

## 2019-10-24 DIAGNOSIS — M9903 Segmental and somatic dysfunction of lumbar region: Secondary | ICD-10-CM | POA: Diagnosis not present

## 2019-10-24 DIAGNOSIS — M9902 Segmental and somatic dysfunction of thoracic region: Secondary | ICD-10-CM | POA: Diagnosis not present

## 2019-10-24 DIAGNOSIS — M9901 Segmental and somatic dysfunction of cervical region: Secondary | ICD-10-CM | POA: Diagnosis not present

## 2019-10-31 DIAGNOSIS — M62830 Muscle spasm of back: Secondary | ICD-10-CM | POA: Diagnosis not present

## 2019-10-31 DIAGNOSIS — M9902 Segmental and somatic dysfunction of thoracic region: Secondary | ICD-10-CM | POA: Diagnosis not present

## 2019-10-31 DIAGNOSIS — M25561 Pain in right knee: Secondary | ICD-10-CM | POA: Diagnosis not present

## 2019-10-31 DIAGNOSIS — M9903 Segmental and somatic dysfunction of lumbar region: Secondary | ICD-10-CM | POA: Diagnosis not present

## 2019-10-31 DIAGNOSIS — M1711 Unilateral primary osteoarthritis, right knee: Secondary | ICD-10-CM | POA: Diagnosis not present

## 2019-10-31 DIAGNOSIS — M9901 Segmental and somatic dysfunction of cervical region: Secondary | ICD-10-CM | POA: Diagnosis not present

## 2019-11-05 ENCOUNTER — Other Ambulatory Visit: Payer: Self-pay | Admitting: Family Medicine

## 2019-11-08 DIAGNOSIS — M1711 Unilateral primary osteoarthritis, right knee: Secondary | ICD-10-CM | POA: Diagnosis not present

## 2019-11-08 DIAGNOSIS — M25561 Pain in right knee: Secondary | ICD-10-CM | POA: Diagnosis not present

## 2019-11-14 DIAGNOSIS — M9901 Segmental and somatic dysfunction of cervical region: Secondary | ICD-10-CM | POA: Diagnosis not present

## 2019-11-14 DIAGNOSIS — M62830 Muscle spasm of back: Secondary | ICD-10-CM | POA: Diagnosis not present

## 2019-11-14 DIAGNOSIS — M9903 Segmental and somatic dysfunction of lumbar region: Secondary | ICD-10-CM | POA: Diagnosis not present

## 2019-11-14 DIAGNOSIS — M9902 Segmental and somatic dysfunction of thoracic region: Secondary | ICD-10-CM | POA: Diagnosis not present

## 2019-11-15 DIAGNOSIS — M1711 Unilateral primary osteoarthritis, right knee: Secondary | ICD-10-CM | POA: Diagnosis not present

## 2019-11-28 DIAGNOSIS — M9903 Segmental and somatic dysfunction of lumbar region: Secondary | ICD-10-CM | POA: Diagnosis not present

## 2019-11-28 DIAGNOSIS — M62830 Muscle spasm of back: Secondary | ICD-10-CM | POA: Diagnosis not present

## 2019-11-28 DIAGNOSIS — M9901 Segmental and somatic dysfunction of cervical region: Secondary | ICD-10-CM | POA: Diagnosis not present

## 2019-11-28 DIAGNOSIS — M9902 Segmental and somatic dysfunction of thoracic region: Secondary | ICD-10-CM | POA: Diagnosis not present

## 2019-12-03 ENCOUNTER — Other Ambulatory Visit: Payer: Self-pay | Admitting: Family Medicine

## 2019-12-03 MED ORDER — MELOXICAM 15 MG PO TABS: 15.0000 mg | ORAL_TABLET | Freq: Every day | ORAL | 3 refills | Status: DC

## 2019-12-06 DIAGNOSIS — M9903 Segmental and somatic dysfunction of lumbar region: Secondary | ICD-10-CM | POA: Diagnosis not present

## 2019-12-06 DIAGNOSIS — M62830 Muscle spasm of back: Secondary | ICD-10-CM | POA: Diagnosis not present

## 2019-12-06 DIAGNOSIS — M9902 Segmental and somatic dysfunction of thoracic region: Secondary | ICD-10-CM | POA: Diagnosis not present

## 2019-12-06 DIAGNOSIS — M9901 Segmental and somatic dysfunction of cervical region: Secondary | ICD-10-CM | POA: Diagnosis not present

## 2019-12-20 DIAGNOSIS — M9902 Segmental and somatic dysfunction of thoracic region: Secondary | ICD-10-CM | POA: Diagnosis not present

## 2019-12-20 DIAGNOSIS — M9903 Segmental and somatic dysfunction of lumbar region: Secondary | ICD-10-CM | POA: Diagnosis not present

## 2019-12-20 DIAGNOSIS — M9901 Segmental and somatic dysfunction of cervical region: Secondary | ICD-10-CM | POA: Diagnosis not present

## 2019-12-20 DIAGNOSIS — M62830 Muscle spasm of back: Secondary | ICD-10-CM | POA: Diagnosis not present

## 2019-12-24 DIAGNOSIS — M5416 Radiculopathy, lumbar region: Secondary | ICD-10-CM | POA: Diagnosis not present

## 2019-12-28 DIAGNOSIS — M1711 Unilateral primary osteoarthritis, right knee: Secondary | ICD-10-CM | POA: Diagnosis not present

## 2020-01-03 DIAGNOSIS — M9903 Segmental and somatic dysfunction of lumbar region: Secondary | ICD-10-CM | POA: Diagnosis not present

## 2020-01-03 DIAGNOSIS — M62830 Muscle spasm of back: Secondary | ICD-10-CM | POA: Diagnosis not present

## 2020-01-03 DIAGNOSIS — M9901 Segmental and somatic dysfunction of cervical region: Secondary | ICD-10-CM | POA: Diagnosis not present

## 2020-01-03 DIAGNOSIS — M9902 Segmental and somatic dysfunction of thoracic region: Secondary | ICD-10-CM | POA: Diagnosis not present

## 2020-01-17 DIAGNOSIS — M9903 Segmental and somatic dysfunction of lumbar region: Secondary | ICD-10-CM | POA: Diagnosis not present

## 2020-01-17 DIAGNOSIS — M9902 Segmental and somatic dysfunction of thoracic region: Secondary | ICD-10-CM | POA: Diagnosis not present

## 2020-01-17 DIAGNOSIS — M9901 Segmental and somatic dysfunction of cervical region: Secondary | ICD-10-CM | POA: Diagnosis not present

## 2020-01-17 DIAGNOSIS — M62830 Muscle spasm of back: Secondary | ICD-10-CM | POA: Diagnosis not present

## 2020-01-22 ENCOUNTER — Other Ambulatory Visit: Payer: Self-pay | Admitting: Family Medicine

## 2020-01-22 DIAGNOSIS — Z1231 Encounter for screening mammogram for malignant neoplasm of breast: Secondary | ICD-10-CM

## 2020-01-28 ENCOUNTER — Ambulatory Visit (INDEPENDENT_AMBULATORY_CARE_PROVIDER_SITE_OTHER): Payer: Medicare PPO

## 2020-01-28 ENCOUNTER — Other Ambulatory Visit: Payer: Self-pay | Admitting: Family Medicine

## 2020-01-28 DIAGNOSIS — Z0000 Encounter for general adult medical examination without abnormal findings: Secondary | ICD-10-CM

## 2020-01-28 NOTE — Progress Notes (Signed)
MEDICARE ANNUAL WELLNESS VISIT  01/28/2020  Telephone Visit Disclaimer This Medicare AWV was conducted by telephone due to national recommendations for restrictions regarding the COVID-19 Pandemic (e.g. social distancing).  I verified, using two identifiers, that I am speaking with Marissa Perez or their authorized healthcare agent. I discussed the limitations, risks, security, and privacy concerns of performing an evaluation and management service by telephone and the potential availability of an in-person appointment in the future. The patient expressed understanding and agreed to proceed.   Subjective:  Marissa Perez is a 74 y.o. female patient of [name], DO who had a Medicare Annual Wellness Visit today via telephone. [name] is Retired and lives with her spouse. She has one child and one granddaughter.   She  reports that she is socially active and does interact with friends/family regularly. She is moderately physically active and enjoys knitting and spending time with her women's group from church knitting prayer shawls.  Patient Care Team: [name], DO as PCP - General (Family Medicine) Gaynelle Arabian, MD as Consulting Physician (Orthopedic Surgery) Suella Broad, MD as Consulting Physician (Physical Medicine and Rehabilitation) Druscilla Brownie, MD as Consulting Physician (Dermatology) Dyke Maes, [place] as Consulting Physician (Optometry) Inda Castle, MD (Inactive) as Consulting Physician (Gastroenterology)  Advanced Directives 01/28/2020 01/12/2019 01/10/2018 01/03/2017 12/12/2015  Does Patient Have a Medical Advance Directive? Yes Yes Yes Yes Yes  Type of Paramedic of [place];Living will [place];Living will [place];Living will [place];Living will [place];Living will  Does patient want to make changes to medical advance directive? No -  Patient declined No - Patient declined - - No - Patient declined  Copy of [place] in Chart? No - copy requested Yes - validated most recent copy scanned in chart (See row information) No - copy requested No - copy requested No - copy requested    Hospital Utilization Over the Past 12 Months: # of hospitalizations or ER visits: 0 # of surgeries: 0  Review of Systems    Patient reports that her overall health is better compared to last year.  History obtained from chart review and the patient  Patient Reported Readings (BP, Pulse, CBG, Weight, etc) none  Pain Assessment Pain : No/denies pain     Current Medications & Allergies (verified) Allergies as of 01/28/2020      Reactions   Codeine    Headache    Femring [estradiol] Rash   Cephalexin Rash   Erythromycin Other (See Comments)   Gi  Upset    Penicillins Rash      Medication List       Accurate as of January 28, 2020  1:32 PM. If you have any questions, ask your nurse or doctor.        STOP taking these medications   OVER THE COUNTER MEDICATION   traMADol 50 MG tablet Commonly known as: ULTRAM     TAKE these medications   atorvastatin 20 MG tablet Commonly known as: LIPITOR Take 1 tablet (20 mg total) by mouth daily.   B-12 1000 MCG Subl Place under the tongue.   BIOTIN PO Take by mouth.   Calcium Citrate-Vitamin D 315-250 MG-UNIT Tabs Commonly known as: Calcium Citrate + D3 Maximum Take 1 tablet by mouth 2 (two) times daily. What changed: additional instructions   clobetasol 0.05 % external solution Commonly known as: TEMOVATE Apply 1 application  topically daily as needed.   ezetimibe 10 MG tablet Commonly known as: ZETIA Take 1 tablet (10 mg total) by mouth daily.   gabapentin 100 MG capsule Commonly known as: NEURONTIN Take 100 mg in the AM and 200mg  capsules at bedtime   levothyroxine 50 MCG tablet Commonly known as: SYNTHROID TAKE 1/2 TAB ON MON, WED, FRI & 1 TAB ON  TUES AND THURS   losartan 50 MG tablet Commonly known as: COZAAR Take 1 tablet (50 mg total) by mouth daily.   meloxicam 15 MG tablet Commonly known as: MOBIC Take 1 tablet (15 mg total) by mouth daily. What changed:   when to take this  reasons to take this   MULTI-VITAMIN PO Take by mouth daily.   omeprazole 20 MG capsule Commonly known as: PRILOSEC Take 20 mg by mouth daily. OTC   PROBIOTIC DAILY PO Take by mouth.   Vascepa 1 g capsule Generic drug: icosapent Ethyl TAKE (2) CAPSULES TWICE DAILY.   VITAMIN D PO Take 5,000 Units by mouth daily.       History (reviewed): Past Medical History:  Diagnosis Date  . Acid reflux   . Allergy   . Cancer ([place])    SKIN  . Diverticulosis   . History of ETT 7/10   abnormal   . History of stomach ulcers 2008   positive h. pylori  . Hyperlipidemia   . Hypertension   . Hypothyroidism   . IBS (irritable bowel syndrome)    Past Surgical History:  Procedure Laterality Date  . [place]  . MOHS SURGERY    . TONSILLECTOMY  1951  . TOTAL ABDOMINAL HYSTERECTOMY W/ BILATERAL SALPINGOOPHORECTOMY  1990   Dr. Tamala Julian / fibroids    Family History  Problem Relation Age of Onset  . Diabetes Father   . Heart disease Father   . Heart attack Father 36  . Breast cancer Mother   . Breast cancer Paternal Grandmother        Aunt also   . Breast cancer Other        Family History  . Breast cancer Maternal Aunt   . Breast cancer Paternal Aunt   . Colon cancer Neg Hx    Social History   Socioeconomic History  . Marital status: Married    Spouse name: Not on file  . Number of children: 1  . Years of education: 70  . Highest education level: Bachelor's degree (e.g., BA, AB, BS)  Occupational History  . 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 Smoking status: Never Smoker  . Smokeless tobacco: Never Used  Vaping Use  . Vaping Use: Never used  Substance and  Sexual Activity  . Alcohol use: Yes    Alcohol/week: 14.0 standard drinks    Types: 14 Glasses of wine per week  . Drug use: No  . Sexual activity: Never  Other Topics Concern  . Not on file  Social History Narrative  . Not on file   Social Determinants of Health   Financial Resource Strain:   . Difficulty of Paying Living Expenses:   Food Insecurity:   . Worried About Charity fundraiser in the Last Year:   . Arboriculturist in the Last Year:   Transportation Needs:   . Film/video editor (Medical):   [name] Kitchen Lack of Transportation (Non-Medical):   Physical Activity:   . Days of Exercise per  Week:   . Minutes of Exercise per Session:   Stress:   . Feeling of Stress :   Social Connections:   . Frequency of Communication with Friends and Family:   . Frequency of Social Gatherings with Friends and Family:   . Attends Religious Services:   . Active Member of Clubs or Organizations:   . Attends Archivist Meetings:   [name] Kitchen Marital Status:     Activities of Daily Living In your present state of health, do you have any difficulty performing the following activities: 01/28/2020  Hearing? N  Vision? N  Difficulty concentrating or making decisions? N  Walking or climbing stairs? Y  Comment some knee pain  Dressing or bathing? N  Doing errands, shopping? N  Preparing Food and eating ? N  Using the Toilet? N  In the past six months, have you accidently leaked urine? N  Do you have problems with loss of bowel control? N  Managing your Medications? N  Managing your Finances? N  Housekeeping or managing your Housekeeping? N  Some recent data might be hidden    Patient Education/ Literacy How often do you need to have someone help you when you read instructions, pamphlets, or other written materials from your doctor or pharmacy?: 1 - Never  Exercise Current Exercise Habits: Home exercise routine, Type of exercise: walking, Time (Minutes): 30, Frequency (Times/Week): 7,  Weekly Exercise (Minutes/Week): 210, Intensity: Mild, Exercise limited by: None identified  Diet Patient reports consuming 3 meals a day and 0 snack(s) a day Patient reports that her primary diet is: Low fat, Low Sodium Patient reports that she does have regular access to food.   Depression Screen PHQ 2/9 Scores 01/28/2020 08/13/2019 05/04/2019 01/12/2019 09/21/2018 06/24/2018 05/08/2018  PHQ - 2 Score 0 0 0 1 0 0 0  PHQ- 9 Score - 0 0 - - - -     Fall Risk Fall Risk  01/28/2020 08/13/2019 05/04/2019 01/12/2019 09/21/2018  Falls in the past year? 0 1 1 1 1   Number falls in past yr: - 1 1 0 0  Injury with Fall? - 1 1 1  0  Risk for fall due to : - History of fall(s) - Impaired balance/gait -  Follow up Falls evaluation completed Falls prevention discussed - Falls prevention discussed;Education provided -     Objective:  Marissa Perez seemed alert and oriented and she participated appropriately during our telephone visit.  Blood Pressure Weight BMI  BP Readings from Last 3 Encounters:  08/13/19 121/66  05/04/19 (!) 160/77  02/27/19 128/73   Wt Readings from Last 3 Encounters:  08/13/19 139 lb (63 kg)  05/04/19 144 lb (65.3 kg)  09/21/18 139 lb (63 kg)   BMI Readings from Last 1 Encounters:  08/13/19 25.42 kg/m    *Unable to obtain current vital signs, weight, and BMI due to telephone visit type  Hearing/Vision  . [place] did not seem to have difficulty with hearing/understanding during the telephone conversation . Reports that she has had a formal eye exam by an eye care professional within the past year . Reports that she has not had a formal hearing evaluation within the past year *Unable to fully assess hearing and vision during telephone visit type  Cognitive Function: 6CIT Screen 01/28/2020 01/12/2019  What Year? 0 points 0 points  What month? 0 points 0 points  What time? 0 points 0 points  Count back from 20 0 points 0 points  Months in reverse  0 points 0 points  Repeat  phrase 0 points 0 points  Total Score 0 0   (Normal:0-7, Significant for Dysfunction: >8)  Normal Cognitive Function Screening: Yes   Immunization & Health Maintenance Record Immunization History  Administered Date(s) Administered  . Fluad Quad(high Dose 65+) 05/04/2019  . Influenza Split 05/21/2013  . Influenza, High Dose Seasonal PF 05/10/2017, 05/08/2018  . Influenza, Quadrivalent, Recombinant, Inj, Pf 05/06/2016  . Influenza,inj,Quad PF,6+ Mos 05/02/2015  . Influenza,inj,quad, With Preservative 05/10/2014, 05/02/2017  . PFIZER SARS-COV-2 Vaccination 08/22/2019, 09/10/2019  . Pneumococcal Conjugate-13 08/29/2013  . Tdap 02/03/2011  . Zoster Recombinat (Shingrix) 01/03/2017, 04/11/2017    Health Maintenance  Topic Date Due  . INFLUENZA VACCINE  03/02/2020  . COLON CANCER SCREENING ANNUAL FOBT  05/06/2020  . TETANUS/TDAP  02/10/2021  . MAMMOGRAM  02/27/2021  . COLONOSCOPY  10/11/2023  . DEXA SCAN  08/26/2027  . COVID-19 Vaccine  Completed  . Hepatitis C Screening  Completed  . PNA vac Low Risk Adult  Completed       Assessment  This is a routine wellness examination for [employer].  Health Maintenance: Due or Overdue There are no preventive care reminders to display for this patient.  Marissa Perez does not need a referral for Community Assistance: Care Management:   no Social Work:    no Prescription Assistance:  no Nutrition/Diabetes Education:  no   Plan:  Personalized Goals Goals Addressed            This Visit's Progress   . Patient Stated       01/28/2020 AWV Goal: Fall Prevention  . Over the next year, patient will decrease their risk for falls by: o Using assistive devices, such as a cane or walker, as needed o Identifying fall risks within their home and correcting them by: - Removing throw rugs - Adding handrails to stairs or ramps - Removing clutter and keeping a clear pathway throughout the home - Increasing light, especially at  night - Adding shower handles/bars - Raising toilet seat o Identifying potential personal risk factors for falls: - Medication side effects - Incontinence/urgency - Vestibular dysfunction - Hearing loss - Musculoskeletal disorders - Neurological disorders - Orthostatic hypotension        Personalized Health Maintenance & Screening Recommendations  Pneumococcal vaccine   Lung Cancer Screening Recommended: no (Low Dose CT Chest recommended if Age 74-80 years, 30 pack-year currently smoking OR have quit w/in past 15 years) Hepatitis C Screening recommended: no HIV Screening recommended: no  Advanced Directives: Written information was not prepared per patient's request.  Referrals & Orders No orders of the defined types were placed in this encounter.   Follow-up Plan . Follow-up with [name], DO as planned    I have personally reviewed and noted the following in the patient's chart:   . Medical and social history . Use of alcohol, tobacco or illicit drugs  . Current medications and supplements . Functional ability and status . Nutritional status . Physical activity . Advanced directives . List of other physicians . Hospitalizations, surgeries, and ER visits in previous 12 months . Vitals . Screenings to include cognitive, depression, and falls . Referrals and appointments  In addition, I have reviewed and discussed with Marissa Perez certain preventive protocols, quality metrics, and best practice recommendations. A written personalized care plan for preventive services as well as general preventive health recommendations is available and can be mailed to the patient at her request.  Felicity Coyer, LPN    [phone]

## 2020-01-31 DIAGNOSIS — M62830 Muscle spasm of back: Secondary | ICD-10-CM | POA: Diagnosis not present

## 2020-01-31 DIAGNOSIS — M9901 Segmental and somatic dysfunction of cervical region: Secondary | ICD-10-CM | POA: Diagnosis not present

## 2020-01-31 DIAGNOSIS — M9902 Segmental and somatic dysfunction of thoracic region: Secondary | ICD-10-CM | POA: Diagnosis not present

## 2020-01-31 DIAGNOSIS — M9903 Segmental and somatic dysfunction of lumbar region: Secondary | ICD-10-CM | POA: Diagnosis not present

## 2020-02-11 ENCOUNTER — Ambulatory Visit: Payer: Medicare PPO | Admitting: Family Medicine

## 2020-02-11 ENCOUNTER — Encounter: Payer: Self-pay | Admitting: Family Medicine

## 2020-02-11 ENCOUNTER — Other Ambulatory Visit: Payer: Self-pay

## 2020-02-11 VITALS — BP 129/78 | HR 78 | Temp 97.1°F | Ht 62.0 in | Wt 140.0 lb

## 2020-02-11 DIAGNOSIS — E034 Atrophy of thyroid (acquired): Secondary | ICD-10-CM

## 2020-02-11 DIAGNOSIS — I10 Essential (primary) hypertension: Secondary | ICD-10-CM | POA: Diagnosis not present

## 2020-02-11 DIAGNOSIS — Z130 Encounter for screening for diseases of the blood and blood-forming organs and certain disorders involving the immune mechanism: Secondary | ICD-10-CM | POA: Diagnosis not present

## 2020-02-11 DIAGNOSIS — M8949 Other hypertrophic osteoarthropathy, multiple sites: Secondary | ICD-10-CM | POA: Diagnosis not present

## 2020-02-11 DIAGNOSIS — I700 Atherosclerosis of aorta: Secondary | ICD-10-CM | POA: Diagnosis not present

## 2020-02-11 DIAGNOSIS — M159 Polyosteoarthritis, unspecified: Secondary | ICD-10-CM

## 2020-02-11 NOTE — Progress Notes (Signed)
Subjective: CC: Follow-up hypertension, hypothyroidism PCP: [name], DO ACZ:YSAYT R Mcelvain is a 74 y.o. female presenting to clinic today for:  1.  Hypertension Patient is compliant with her medication.  No chest pain, shortness of breath, visual disturbance.  2.  Hypothyroidism Patient is compliant with her Synthroid 50 mcg daily.  No abrupt change in weight, bowel habit, tremor.  No heart palpitations  3.  Chronic knee pain and back pain Patient reports viscosupplementation did help quite a bit with her knee pain.  She continues to have some degree of discomfort but nothing near what it used to be.  She continues to see the chiropractor, Dr. Andres Labrum, every 2 weeks for adjustments.  She is continuing her oral NSAID as needed, again using this less than previously, and Voltaren topically.  ROS: Per HPI  Allergies  Allergen Reactions  . Codeine     Headache    . Femring [Estradiol] Rash  . Cephalexin Rash  . Erythromycin Other (See Comments)    Gi  Upset    . Penicillins Rash   Past Medical History:  Diagnosis Date  . Acid reflux   . Allergy   . Cancer ([place])    SKIN  . Diverticulosis   . History of ETT 7/10   abnormal   . History of stomach ulcers 2008   positive h. pylori  . Hyperlipidemia   . Hypertension   . Hypothyroidism   . IBS (irritable bowel syndrome)     Current Outpatient Medications:  .  atorvastatin (LIPITOR) 20 MG tablet, Take 1 tablet (20 mg total) by mouth daily., Disp: 90 tablet, Rfl: 3 .  BIOTIN PO, Take by mouth.  , Disp: , Rfl:  .  Calcium Citrate-Vitamin D (CALCIUM CITRATE + D3 MAXIMUM) 315-250 MG-UNIT TABS, Take 1 tablet by mouth 2 (two) times daily. (Patient taking differently: Take 1 tablet by mouth 2 (two) times daily. With magnesium), Disp: 120 tablet, Rfl:  .  Cholecalciferol (VITAMIN D PO), Take 5,000 Units by mouth daily., Disp: , Rfl:  .  clobetasol (TEMOVATE) 0.05 % external solution, Apply 1 application topically  daily as needed. , Disp: , Rfl:  .  Cyanocobalamin (B-12) 1000 MCG SUBL, Place under the tongue., Disp: , Rfl:  .  ezetimibe (ZETIA) 10 MG tablet, Take 1 tablet (10 mg total) by mouth daily., Disp: 90 tablet, Rfl: 01 .  gabapentin (NEURONTIN) 100 MG capsule, TAKE 1 CAPSULE IN THE MORNING AND 2 CAPSULES AT BEDTIME, Disp: 360 capsule, Rfl: 0 .  Glucosamine-Chondroit-Vit C-Mn (GLUCOSAMINE CHONDR 1500 COMPLX PO), Take by mouth., Disp: , Rfl:  .  levothyroxine (SYNTHROID) 50 MCG tablet, TAKE 1/2 TAB ON MON, WED, FRI & 1 TAB ON TUES AND THURS, Disp: 90 tablet, Rfl: 2 .  losartan (COZAAR) 50 MG tablet, Take 1 tablet (50 mg total) by mouth daily., Disp: 90 tablet, Rfl: 3 .  meloxicam (MOBIC) 15 MG tablet, Take 1 tablet (15 mg total) by mouth daily. (Patient taking differently: Take 15 mg by mouth as needed. ), Disp: 90 tablet, Rfl: 3 .  Multiple Vitamin (MULTI-VITAMIN PO), Take by mouth daily.  , Disp: , Rfl:  .  omeprazole (PRILOSEC) 20 MG capsule, Take 20 mg by mouth daily. OTC, Disp: , Rfl:  .  Probiotic Product (PROBIOTIC DAILY PO), Take by mouth., Disp: , Rfl:  .  VASCEPA 1 g capsule, TAKE (2) CAPSULES TWICE DAILY., Disp: 120 capsule, Rfl: 12 Social History   Socioeconomic History  .  Marital status: Married    Spouse name: Not on file  . Number of children: 1  . Years of education: 65  . Highest education level: Bachelor's degree (e.g., BA, AB, BS)  Occupational History  . 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 Smoking status: Never Smoker  . Smokeless tobacco: Never Used  Vaping Use  . Vaping Use: Never used  Substance and Sexual Activity  . Alcohol use: Yes    Alcohol/week: 14.0 standard drinks    Types: 14 Glasses of wine per week  . Drug use: No  . Sexual activity: Never  Other Topics Concern  . Not on file  Social History Narrative  . Not on file   Social Determinants of Health   Financial Resource Strain:   . Difficulty of  Paying Living Expenses:   Food Insecurity:   . Worried About Charity fundraiser in the Last Year:   . Arboriculturist in the Last Year:   Transportation Needs:   . Film/video editor (Medical):   [name] Kitchen Lack of Transportation (Non-Medical):   Physical Activity:   . Days of Exercise per Week:   . Minutes of Exercise per Session:   Stress:   . Feeling of Stress :   Social Connections:   . Frequency of Communication with Friends and Family:   . Frequency of Social Gatherings with Friends and Family:   . Attends Religious Services:   . Active Member of Clubs or Organizations:   . Attends Archivist Meetings:   [name] Kitchen Marital Status:   Intimate Partner Violence:   . Fear of Current or Ex-Partner:   . Emotionally Abused:   [name] Kitchen Physically Abused:   . Sexually Abused:    Family History  Problem Relation Age of Onset  . Diabetes Father   . Heart disease Father   . Heart attack Father 67  . Breast cancer Mother   . Breast cancer Paternal Grandmother        Aunt also   . Breast cancer Other        Family History  . Breast cancer Maternal Aunt   . Breast cancer Paternal Aunt   . Colon cancer Neg Hx     Objective: Office vital signs reviewed. BP 129/78   Pulse 78   Temp (!) 97.1 F (36.2 C) (Temporal)   Ht _0  (1.575 m)   Wt 140 lb (63.5 kg)   SpO2 97%   BMI 25.61 kg/m   Physical Examination:  General: Awake, alert, well nourished, No acute distress HEENT: Normal, no exophthalmos.  No goiter.  Alopecia of scalp. Cardio: regular rate and rhythm, S1S2 heard, no murmurs appreciated Pulm: clear to auscultation bilaterally, no wheezes, rhonchi or rales; normal work of breathing on room air Extremities: warm, well perfused, No edema, cyanosis or clubbing; +2 pulses bilaterally MSK: slightly antalgic gait and station Neuro: no tremor Skin: warm, dry.  Assessment/ Plan: 74 y.o. female   1. Essential hypertension Under excellent control.  Continue current regimen -  CMP14+EGFR; Future  2. Hypothyroidism due to acquired atrophy of thyroid She will come in for fasting labs and will check thyroid with that - CMP14+EGFR; Future - Lipid Panel; Future - Thyroid Panel With TSH; Future  3. Aortic atherosclerosis (HCC) - CBC; Future - CMP14+EGFR; Future - Lipid Panel; Future - Thyroid Panel With TSH; Future  4. Screening, anemia, deficiency, iron - CBC;  Future  5. Primary osteoarthritis involving multiple joints Stable s/p viscosupplementation w/ Emerge.  Continue with Dr Andres Labrum for back.    No orders of the defined types were placed in this encounter.  No orders of the defined types were placed in this encounter.    [name], DO [place] [phone]

## 2020-02-11 NOTE — Patient Instructions (Signed)
Come in for fasting labs at your convenience.   

## 2020-02-12 ENCOUNTER — Other Ambulatory Visit: Payer: Medicare PPO

## 2020-02-12 DIAGNOSIS — I10 Essential (primary) hypertension: Secondary | ICD-10-CM | POA: Diagnosis not present

## 2020-02-12 DIAGNOSIS — I700 Atherosclerosis of aorta: Secondary | ICD-10-CM

## 2020-02-12 DIAGNOSIS — E034 Atrophy of thyroid (acquired): Secondary | ICD-10-CM

## 2020-02-12 DIAGNOSIS — Z130 Encounter for screening for diseases of the blood and blood-forming organs and certain disorders involving the immune mechanism: Secondary | ICD-10-CM | POA: Diagnosis not present

## 2020-02-13 DIAGNOSIS — M9903 Segmental and somatic dysfunction of lumbar region: Secondary | ICD-10-CM | POA: Diagnosis not present

## 2020-02-13 DIAGNOSIS — M9901 Segmental and somatic dysfunction of cervical region: Secondary | ICD-10-CM | POA: Diagnosis not present

## 2020-02-13 DIAGNOSIS — M9902 Segmental and somatic dysfunction of thoracic region: Secondary | ICD-10-CM | POA: Diagnosis not present

## 2020-02-13 DIAGNOSIS — M62830 Muscle spasm of back: Secondary | ICD-10-CM | POA: Diagnosis not present

## 2020-02-13 LAB — CBC
Hematocrit: 37.5 % (ref 34.0–46.6)
Hemoglobin: 13 g/dL (ref 11.1–15.9)
MCH: 31.7 pg (ref 26.6–33.0)
MCHC: 34.7 g/dL (ref 31.5–35.7)
MCV: 92 fL (ref 79–97)
Platelets: 300 10*3/uL (ref 150–450)
RBC: 4.1 x10E6/uL (ref 3.77–5.28)
RDW: 12.3 % (ref 11.7–15.4)
WBC: 4.5 10*3/uL (ref 3.4–10.8)

## 2020-02-13 LAB — CMP14+EGFR
ALT: 37 IU/L — ABNORMAL HIGH (ref 0–32)
AST: 30 IU/L (ref 0–40)
Albumin/Globulin Ratio: 1.8 (ref 1.2–2.2)
Albumin: 4.8 g/dL — ABNORMAL HIGH (ref 3.7–4.7)
Alkaline Phosphatase: 94 IU/L (ref 48–121)
BUN/Creatinine Ratio: 14 (ref 12–28)
BUN: 11 mg/dL (ref 8–27)
Bilirubin Total: 0.4 mg/dL (ref 0.0–1.2)
CO2: 23 mmol/L (ref 20–29)
Calcium: 9.8 mg/dL (ref 8.7–10.3)
Chloride: 99 mmol/L (ref 96–106)
Creatinine, Ser: 0.76 mg/dL (ref 0.57–1.00)
GFR calc Af Amer: 89 mL/min/{1.73_m2} (ref >59)
GFR calc non Af Amer: 78 mL/min/{1.73_m2} (ref >59)
Globulin, Total: 2.6 g/dL (ref 1.5–4.5)
Glucose: 97 mg/dL (ref 65–99)
Potassium: 4.7 mmol/L (ref 3.5–5.2)
Sodium: 137 mmol/L (ref 134–144)
Total Protein: 7.4 g/dL (ref 6.0–8.5)

## 2020-02-13 LAB — THYROID PANEL WITH TSH
Free Thyroxine Index: 1.9 (ref 1.2–4.9)
T3 Uptake Ratio: 30 % (ref 24–39)
T4, Total: 6.4 ug/dL (ref 4.5–12.0)
TSH: 3.33 u[IU]/mL (ref 0.450–4.500)

## 2020-02-13 LAB — LIPID PANEL
Chol/HDL Ratio: 2.8 ratio (ref 0.0–4.4)
Cholesterol, Total: 183 mg/dL (ref 100–199)
HDL: 66 mg/dL (ref >39)
LDL Chol Calc (NIH): 94 mg/dL (ref 0–99)
Triglycerides: 131 mg/dL (ref 0–149)
VLDL Cholesterol Cal: 23 mg/dL (ref 5–40)

## 2020-02-14 ENCOUNTER — Encounter: Payer: Self-pay | Admitting: Family Medicine

## 2020-02-29 DIAGNOSIS — L821 Other seborrheic keratosis: Secondary | ICD-10-CM | POA: Diagnosis not present

## 2020-02-29 DIAGNOSIS — D2239 Melanocytic nevi of other parts of face: Secondary | ICD-10-CM | POA: Diagnosis not present

## 2020-02-29 DIAGNOSIS — D2261 Melanocytic nevi of right upper limb, including shoulder: Secondary | ICD-10-CM | POA: Diagnosis not present

## 2020-02-29 DIAGNOSIS — D224 Melanocytic nevi of scalp and neck: Secondary | ICD-10-CM | POA: Diagnosis not present

## 2020-02-29 DIAGNOSIS — D1801 Hemangioma of skin and subcutaneous tissue: Secondary | ICD-10-CM | POA: Diagnosis not present

## 2020-02-29 DIAGNOSIS — D2262 Melanocytic nevi of left upper limb, including shoulder: Secondary | ICD-10-CM | POA: Diagnosis not present

## 2020-02-29 DIAGNOSIS — D2272 Melanocytic nevi of left lower limb, including hip: Secondary | ICD-10-CM | POA: Diagnosis not present

## 2020-02-29 DIAGNOSIS — D225 Melanocytic nevi of trunk: Secondary | ICD-10-CM | POA: Diagnosis not present

## 2020-02-29 DIAGNOSIS — D2271 Melanocytic nevi of right lower limb, including hip: Secondary | ICD-10-CM | POA: Diagnosis not present

## 2020-03-03 ENCOUNTER — Ambulatory Visit
Admission: RE | Admit: 2020-03-03 | Discharge: 2020-03-03 | Disposition: A | Discharge status: Home / Self Care | Payer: Medicare PPO | Source: Ambulatory Visit

## 2020-03-03 ENCOUNTER — Other Ambulatory Visit: Payer: Self-pay

## 2020-03-03 DIAGNOSIS — Z1231 Encounter for screening mammogram for malignant neoplasm of breast: Secondary | ICD-10-CM

## 2020-04-08 ENCOUNTER — Other Ambulatory Visit: Payer: Self-pay | Admitting: Family Medicine

## 2020-05-02 ENCOUNTER — Other Ambulatory Visit: Payer: Self-pay | Admitting: Family Medicine

## 2020-05-28 ENCOUNTER — Other Ambulatory Visit: Payer: Self-pay

## 2020-05-28 ENCOUNTER — Ambulatory Visit (INDEPENDENT_AMBULATORY_CARE_PROVIDER_SITE_OTHER): Payer: Medicare PPO

## 2020-05-28 DIAGNOSIS — Z23 Encounter for immunization: Secondary | ICD-10-CM

## 2020-05-28 NOTE — Progress Notes (Signed)
   Covid-19 Vaccination Clinic  Name:  MASEN SALVAS    MRN: [id number] DOB: 1945-11-21  05/28/2020  Ms. Jahr was observed post Covid-19 immunization for 15 minutes without incident. She was provided with Vaccine Information Sheet and instruction to access the V-Safe system.   Ms. Cazarez was instructed to call 911 with any severe reactions post vaccine: [name] Kitchen Difficulty breathing  . Swelling of face and throat  . A fast heartbeat  . A bad rash all over body  . Dizziness and weakness

## 2020-06-19 ENCOUNTER — Other Ambulatory Visit: Payer: Self-pay | Admitting: Family Medicine

## 2020-08-06 ENCOUNTER — Other Ambulatory Visit: Payer: Self-pay | Admitting: Family Medicine

## 2020-08-06 DIAGNOSIS — M9902 Segmental and somatic dysfunction of thoracic region: Secondary | ICD-10-CM | POA: Diagnosis not present

## 2020-08-06 DIAGNOSIS — M9901 Segmental and somatic dysfunction of cervical region: Secondary | ICD-10-CM | POA: Diagnosis not present

## 2020-08-06 DIAGNOSIS — M62830 Muscle spasm of back: Secondary | ICD-10-CM | POA: Diagnosis not present

## 2020-08-06 DIAGNOSIS — M9903 Segmental and somatic dysfunction of lumbar region: Secondary | ICD-10-CM | POA: Diagnosis not present

## 2020-08-07 DIAGNOSIS — D3131 Benign neoplasm of right choroid: Secondary | ICD-10-CM | POA: Diagnosis not present

## 2020-08-07 DIAGNOSIS — H04123 Dry eye syndrome of bilateral lacrimal glands: Secondary | ICD-10-CM | POA: Diagnosis not present

## 2020-08-07 DIAGNOSIS — H02831 Dermatochalasis of right upper eyelid: Secondary | ICD-10-CM | POA: Diagnosis not present

## 2020-08-07 DIAGNOSIS — H2513 Age-related nuclear cataract, bilateral: Secondary | ICD-10-CM | POA: Diagnosis not present

## 2020-08-07 DIAGNOSIS — H02834 Dermatochalasis of left upper eyelid: Secondary | ICD-10-CM | POA: Diagnosis not present

## 2020-08-11 ENCOUNTER — Encounter: Payer: Self-pay | Admitting: Family Medicine

## 2020-08-13 ENCOUNTER — Ambulatory Visit (INDEPENDENT_AMBULATORY_CARE_PROVIDER_SITE_OTHER): Payer: Medicare PPO

## 2020-08-13 ENCOUNTER — Ambulatory Visit: Payer: Medicare PPO | Admitting: Family Medicine

## 2020-08-13 ENCOUNTER — Other Ambulatory Visit: Payer: Self-pay

## 2020-08-13 VITALS — BP 140/72 | HR 70 | Temp 97.1°F | Ht 62.0 in | Wt 141.0 lb

## 2020-08-13 DIAGNOSIS — K449 Diaphragmatic hernia without obstruction or gangrene: Secondary | ICD-10-CM | POA: Diagnosis not present

## 2020-08-13 DIAGNOSIS — R198 Other specified symptoms and signs involving the digestive system and abdomen: Secondary | ICD-10-CM | POA: Diagnosis not present

## 2020-08-13 DIAGNOSIS — I10 Essential (primary) hypertension: Secondary | ICD-10-CM | POA: Diagnosis not present

## 2020-08-13 DIAGNOSIS — E034 Atrophy of thyroid (acquired): Secondary | ICD-10-CM

## 2020-08-13 DIAGNOSIS — E559 Vitamin D deficiency, unspecified: Secondary | ICD-10-CM

## 2020-08-13 DIAGNOSIS — E782 Mixed hyperlipidemia: Secondary | ICD-10-CM

## 2020-08-13 DIAGNOSIS — M85832 Other specified disorders of bone density and structure, left forearm: Secondary | ICD-10-CM | POA: Diagnosis not present

## 2020-08-13 DIAGNOSIS — M8588 Other specified disorders of bone density and structure, other site: Secondary | ICD-10-CM | POA: Diagnosis not present

## 2020-08-13 DIAGNOSIS — R1013 Epigastric pain: Secondary | ICD-10-CM

## 2020-08-13 MED ORDER — GABAPENTIN 100 MG PO CAPS: ORAL_CAPSULE | ORAL | 3 refills | Status: DC

## 2020-08-13 MED ORDER — ICOSAPENT ETHYL 1 G PO CAPS: ORAL_CAPSULE | ORAL | 12 refills | Status: DC

## 2020-08-13 MED ORDER — LOSARTAN POTASSIUM 50 MG PO TABS: 50.0000 mg | ORAL_TABLET | Freq: Every day | ORAL | 3 refills | Status: DC

## 2020-08-13 MED ORDER — ATORVASTATIN CALCIUM 20 MG PO TABS: 20.0000 mg | ORAL_TABLET | Freq: Every day | ORAL | 3 refills | Status: DC

## 2020-08-13 MED ORDER — OMEPRAZOLE 20 MG PO CPDR: 20.0000 mg | DELAYED_RELEASE_CAPSULE | Freq: Two times a day (BID) | ORAL | 1 refills | Status: DC

## 2020-08-13 MED ORDER — EZETIMIBE 10 MG PO TABS: 10.0000 mg | ORAL_TABLET | Freq: Every day | ORAL | 3 refills | Status: DC

## 2020-08-13 NOTE — Progress Notes (Signed)
Subjective: CC: Hypothyroidism PCP: [name], DO Marissa Perez is a 75 y.o. female presenting to clinic today for:  1. Hypothyroidism Compliant with thyroid medication.  No reports of tremor or heart palpitations but she does note some loose stool lately  2. abdominal tightness and discomfort Patient reports feeling like she has increased indigestion.  She has been using omeprazole 20 mg daily.  She reports some loose stool as above but no hematochezia or melena.  No nausea or vomiting.  She is followed by Dr. Deatra Ina but has not seen him in several years.  She admits that when she is exerting herself, at the end of exertion she feels a tightness around her abdomen.  After resting this seems to alleviate this.  Does not report associated nausea, vomiting, diaphoresis.  3. osteopenia Patient with history of vitamin D deficiency.  Last DEXA was in 2019 which showed osteopenia of the forearm.   ROS: Per HPI  Allergies  Allergen Reactions  . Codeine     Headache    . Femring [Estradiol] Rash  . Cephalexin Rash  . Erythromycin Other (See Comments)    Gi  Upset    . Penicillins Rash   Past Medical History:  Diagnosis Date  . Acid reflux   . Allergy   . Cancer ([place])    SKIN  . Diverticulosis   . History of ETT 7/10   abnormal   . History of stomach ulcers 2008   positive h. pylori  . Hyperlipidemia   . Hypertension   . Hypothyroidism   . IBS (irritable bowel syndrome)     Current Outpatient Medications:  .  atorvastatin (LIPITOR) 20 MG tablet, TAKE 1 TABLET DAILY, Disp: 90 tablet, Rfl: 0 .  BIOTIN PO, Take by mouth., Disp: , Rfl:  .  Calcium Citrate-Vitamin D (CALCIUM CITRATE + D3 MAXIMUM) 315-250 MG-UNIT TABS, Take 1 tablet by mouth 2 (two) times daily. (Patient taking differently: Take 1 tablet by mouth 2 (two) times daily. With magnesium), Disp: 120 tablet, Rfl:  .  Cholecalciferol (VITAMIN D PO), Take 5,000 Units by mouth daily., Disp: , Rfl:  .   clobetasol (TEMOVATE) 0.05 % external solution, Apply 1 application topically daily as needed. , Disp: , Rfl:  .  Cyanocobalamin (B-12) 1000 MCG SUBL, Place under the tongue., Disp: , Rfl:  .  diclofenac Sodium (VOLTAREN) 1 % GEL, Apply 4 g topically 4 (four) times daily., Disp: , Rfl:  .  ezetimibe (ZETIA) 10 MG tablet, TAKE 1 TABLET DAILY, Disp: 90 tablet, Rfl: 1 .  gabapentin (NEURONTIN) 100 MG capsule, TAKE 1 CAPSULE IN THE MORNING AND 2 CAPSULES AT BEDTIME, Disp: 360 capsule, Rfl: 0 .  Glucosamine-Chondroit-Vit C-Mn (GLUCOSAMINE CHONDR 1500 COMPLX PO), Take by mouth., Disp: , Rfl:  .  levothyroxine (SYNTHROID) 50 MCG tablet, TAKE 1/2 TAB ON MON, WED, FRI & 1 TAB ON TUES AND THURS, Disp: 90 tablet, Rfl: 2 .  losartan (COZAAR) 50 MG tablet, TAKE 1 TABLET DAILY, Disp: 90 tablet, Rfl: 0 .  meloxicam (MOBIC) 15 MG tablet, Take 1 tablet (15 mg total) by mouth daily. (Patient taking differently: Take 15 mg by mouth as needed.), Disp: 90 tablet, Rfl: 3 .  Multiple Vitamin (MULTI-VITAMIN PO), Take by mouth daily., Disp: , Rfl:  .  omeprazole (PRILOSEC) 20 MG capsule, Take 20 mg by mouth daily. OTC, Disp: , Rfl:  .  Probiotic Product (PROBIOTIC DAILY PO), Take by mouth., Disp: , Rfl:  .  VASCEPA 1 g capsule, TAKE (2) CAPSULES TWICE DAILY., Disp: 120 capsule, Rfl: 12 Social History   Socioeconomic History  . Marital status: Married    Spouse name: Not on file  . Number of children: 1  . Years of education: 39  . Highest education level: Bachelor's degree (e.g., BA, AB, BS)  Occupational History  . 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 Smoking status: Never Smoker  . Smokeless tobacco: Never Used  Vaping Use  . Vaping Use: Never used  Substance and Sexual Activity  . Alcohol use: Yes    Alcohol/week: 14.0 standard drinks    Types: 14 Glasses of wine per week  . Drug use: No  . Sexual activity: Never  Other Topics Concern  . Not on file   Social History Narrative  . Not on file   Social Determinants of Health   Financial Resource Strain: Not on file  Food Insecurity: Not on file  Transportation Needs: Not on file  Physical Activity: Not on file  Stress: Not on file  Social Connections: Not on file  Intimate Partner Violence: Not on file   Family History  Problem Relation Age of Onset  . Diabetes Father   . Heart disease Father   . Heart attack Father 42  . Breast cancer Mother   . Breast cancer Paternal Grandmother        Aunt also   . Breast cancer Other        Family History  . Breast cancer Maternal Aunt   . Breast cancer Paternal Aunt   . Colon cancer Neg Hx     Objective: Office vital signs reviewed. BP 140/72   Pulse 70   Temp (!) 97.1 F (36.2 C) (Temporal)   Ht 5' 2" (1.575 m)   Wt 141 lb (64 kg)   SpO2 98%   BMI 25.79 kg/m   Physical Examination:  General: Awake, alert, well nourished, No acute distress HEENT: Normal; sclera white.  No exophthalmos. Cardio: regular rate and rhythm, S1S2 heard, no murmurs appreciated Pulm: Bowel sounds noted on the left base.  Otherwise clear to auscultation bilaterally with normal work of breathing on room air GI: soft, mild epigastric and right upper quadrant tenderness.  Non-distended, bowel sounds present x4, no hepatomegaly, no splenomegaly, no masses MSK: Normal gait and station Skin: dry; intact; no rashes or lesions Neuro: No tremor  Assessment/ Plan: 75 y.o. female   Epigastric pain - Plan: Ambulatory referral to Gastroenterology, DG Chest 2 View  Abdominal tightness - Plan: Ambulatory referral to Gastroenterology, DG Chest 2 View  Hypothyroidism due to acquired atrophy of thyroid - Plan: Thyroid Panel With TSH  Essential hypertension - Plan: CMP14+EGFR  Vitamin D deficiency - Plan: VITAMIN D 25 Hydroxy (Vit-D Deficiency, Fractures)  Osteopenia of left forearm - Plan: CMP14+EGFR, VITAMIN D 25 Hydroxy (Vit-D Deficiency, Fractures), DG  [facility] DEXA  Mixed hyperlipidemia - Plan: Lipid panel  Uncertain etiology of her epigastric pain.  Based on the symptomology it sounds almost like she has a hiatal hernia.  I I have ordered a plain film to see if we see any evidence of the stomach in the chest cavity.  There did seem to be what sounded like gastric sounds on the left side but these could be radiated from the abdomen.  I have increased her PPI to 20 mg twice daily and place a new referral back to Dr. Deatra Ina  for further evaluation of this.  She will come in for thyroid panel, CMP, fasting lipids  Blood pressure is controlled  Check for vitamin D deficiency  DEXA scan ordered as well as it has been 2 years since her last  No orders of the defined types were placed in this encounter.  No orders of the defined types were placed in this encounter.    [name], DO [place] [phone]

## 2020-08-14 ENCOUNTER — Encounter: Payer: Self-pay | Admitting: Family Medicine

## 2020-08-14 DIAGNOSIS — Z780 Asymptomatic menopausal state: Secondary | ICD-10-CM | POA: Diagnosis not present

## 2020-08-14 DIAGNOSIS — M85832 Other specified disorders of bone density and structure, left forearm: Secondary | ICD-10-CM | POA: Diagnosis not present

## 2020-08-15 ENCOUNTER — Other Ambulatory Visit: Payer: Self-pay

## 2020-08-15 ENCOUNTER — Other Ambulatory Visit: Payer: Medicare PPO

## 2020-08-15 DIAGNOSIS — E034 Atrophy of thyroid (acquired): Secondary | ICD-10-CM | POA: Diagnosis not present

## 2020-08-15 DIAGNOSIS — M85832 Other specified disorders of bone density and structure, left forearm: Secondary | ICD-10-CM | POA: Diagnosis not present

## 2020-08-15 DIAGNOSIS — E782 Mixed hyperlipidemia: Secondary | ICD-10-CM | POA: Diagnosis not present

## 2020-08-15 DIAGNOSIS — E559 Vitamin D deficiency, unspecified: Secondary | ICD-10-CM | POA: Diagnosis not present

## 2020-08-15 DIAGNOSIS — R1013 Epigastric pain: Secondary | ICD-10-CM

## 2020-08-15 DIAGNOSIS — I10 Essential (primary) hypertension: Secondary | ICD-10-CM | POA: Diagnosis not present

## 2020-08-15 NOTE — Addendum Note (Signed)
Addended by: [name] on: 08/15/2020 03:01 PM   Modules accepted: Orders

## 2020-08-16 LAB — CMP14+EGFR
ALT: 28 IU/L (ref 0–32)
AST: 28 IU/L (ref 0–40)
Albumin/Globulin Ratio: 1.9 (ref 1.2–2.2)
Albumin: 4.8 g/dL — ABNORMAL HIGH (ref 3.7–4.7)
Alkaline Phosphatase: 105 IU/L (ref 44–121)
BUN/Creatinine Ratio: 17 (ref 12–28)
BUN: 12 mg/dL (ref 8–27)
Bilirubin Total: 0.5 mg/dL (ref 0.0–1.2)
CO2: 24 mmol/L (ref 20–29)
Calcium: 9.8 mg/dL (ref 8.7–10.3)
Chloride: 97 mmol/L (ref 96–106)
Creatinine, Ser: 0.7 mg/dL (ref 0.57–1.00)
GFR calc Af Amer: 99 mL/min/{1.73_m2} (ref >59)
GFR calc non Af Amer: 86 mL/min/{1.73_m2} (ref >59)
Globulin, Total: 2.5 g/dL (ref 1.5–4.5)
Glucose: 87 mg/dL (ref 65–99)
Potassium: 4.6 mmol/L (ref 3.5–5.2)
Sodium: 137 mmol/L (ref 134–144)
Total Protein: 7.3 g/dL (ref 6.0–8.5)

## 2020-08-16 LAB — LIPID PANEL
Chol/HDL Ratio: 2.5 ratio (ref 0.0–4.4)
Cholesterol, Total: 162 mg/dL (ref 100–199)
HDL: 65 mg/dL (ref >39)
LDL Chol Calc (NIH): 71 mg/dL (ref 0–99)
Triglycerides: 151 mg/dL — ABNORMAL HIGH (ref 0–149)
VLDL Cholesterol Cal: 26 mg/dL (ref 5–40)

## 2020-08-16 LAB — THYROID PANEL WITH TSH
Free Thyroxine Index: 1.8 (ref 1.2–4.9)
T3 Uptake Ratio: 28 % (ref 24–39)
T4, Total: 6.4 ug/dL (ref 4.5–12.0)
TSH: 5.38 u[IU]/mL — ABNORMAL HIGH (ref 0.450–4.500)

## 2020-08-16 LAB — FECAL OCCULT BLOOD, IMMUNOCHEMICAL: Fecal Occult Bld: NEGATIVE

## 2020-08-16 LAB — VITAMIN D 25 HYDROXY (VIT D DEFICIENCY, FRACTURES): Vit D, 25-Hydroxy: 46.3 ng/mL (ref 30.0–100.0)

## 2020-08-20 ENCOUNTER — Encounter: Payer: Self-pay | Admitting: Physician Assistant

## 2020-08-20 DIAGNOSIS — M9902 Segmental and somatic dysfunction of thoracic region: Secondary | ICD-10-CM | POA: Diagnosis not present

## 2020-08-20 DIAGNOSIS — M9901 Segmental and somatic dysfunction of cervical region: Secondary | ICD-10-CM | POA: Diagnosis not present

## 2020-08-20 DIAGNOSIS — M9903 Segmental and somatic dysfunction of lumbar region: Secondary | ICD-10-CM | POA: Diagnosis not present

## 2020-08-20 DIAGNOSIS — M62830 Muscle spasm of back: Secondary | ICD-10-CM | POA: Diagnosis not present

## 2020-09-03 ENCOUNTER — Ambulatory Visit: Payer: Medicare PPO | Admitting: Physician Assistant

## 2020-09-03 ENCOUNTER — Other Ambulatory Visit: Payer: Self-pay

## 2020-09-03 ENCOUNTER — Encounter: Payer: Self-pay | Admitting: Physician Assistant

## 2020-09-03 VITALS — BP 160/70 | HR 72 | Ht 61.0 in | Wt 143.2 lb

## 2020-09-03 DIAGNOSIS — M9901 Segmental and somatic dysfunction of cervical region: Secondary | ICD-10-CM | POA: Diagnosis not present

## 2020-09-03 DIAGNOSIS — M9902 Segmental and somatic dysfunction of thoracic region: Secondary | ICD-10-CM | POA: Diagnosis not present

## 2020-09-03 DIAGNOSIS — M62830 Muscle spasm of back: Secondary | ICD-10-CM | POA: Diagnosis not present

## 2020-09-03 DIAGNOSIS — K219 Gastro-esophageal reflux disease without esophagitis: Secondary | ICD-10-CM

## 2020-09-03 DIAGNOSIS — M9903 Segmental and somatic dysfunction of lumbar region: Secondary | ICD-10-CM | POA: Diagnosis not present

## 2020-09-03 DIAGNOSIS — R1013 Epigastric pain: Secondary | ICD-10-CM

## 2020-09-03 NOTE — Patient Instructions (Signed)
If you are age 75 or older, your body mass index should be between 23-30. Your Body mass index is 27.07 kg/m. If this is out of the aforementioned range listed, please consider follow up with your Primary Care Provider.  If you are age 38 or younger, your body mass index should be between 19-25. Your Body mass index is 27.07 kg/m. If this is out of the aformentioned range listed, please consider follow up with your Primary Care Provider.   You have been scheduled for an endoscopy. Please follow written instructions given to you at your visit today. If you use inhalers (even only as needed), please bring them with you on the day of your procedure.  Continue Omeprazole 20 mg twice daily.  Thank you for choosing me and [facility].  Ellouise Newer, PA-C

## 2020-09-03 NOTE — Progress Notes (Signed)
Chief Complaint: Epigastric pain  HPI:    Marissa Perez is a 75 year old female with a past medical history as listed below, known to Dr. Deatra Ina previously, who was referred to me by [name], DO for a complaint of epigastric pain.      10/10/2013 colonoscopy with mild diverticulosis in the sigmoid colon otherwise normal.  Repeat recommended in 10 years.    08/13/2020 patient saw PCP for epigastric pain, described increased indigestion and had been using 20 mg of omeprazole daily.  Reports some loose stool but no hematochezia or melena.  Her omeprazole was increased to 20 mg twice daily and she was referred to [place].  Chest x-ray did not show a hiatal hernia.    08/15/2020 CMP normal.  Fecal occult negative.    Today, the patient presents to clinic and tells me that she has developed some epigastric pain over the past couple of months which feels like a "squeezing", and also gets some reflux symptoms.  Apparently she had similar symptoms years ago when she saw Dr. Deatra Ina and he thought she had a "anxious stomach".  Patient tells me that she is stressed just like everyone else in life, but notices the squeezing worse when walking up her hill at home or moving the trash cans and or sometimes after eating.  Using Omeprazole 20 mg twice daily has helped over the past few weeks and her symptoms are 75% better.  She is still wanting to make sure that nothing is wrong.    Denies fever, chills, weight loss or change in bowel habits.  Past Medical History:  Diagnosis Date  . Acid reflux   . Allergy   . Cancer ([place])    SKIN  . Diverticulosis   . History of ETT 7/10   abnormal   . History of stomach ulcers 2008   positive h. pylori  . Hyperlipidemia   . Hypertension   . Hypothyroidism   . IBS (irritable bowel syndrome)     Past Surgical History:  Procedure Laterality Date  . [place]  . MOHS SURGERY    . TONSILLECTOMY  1951  . TOTAL ABDOMINAL HYSTERECTOMY W/ BILATERAL  SALPINGOOPHORECTOMY  1990   Dr. Tamala Julian / fibroids     Current Outpatient Medications  Medication Sig Dispense Refill  . atorvastatin (LIPITOR) 20 MG tablet Take 1 tablet (20 mg total) by mouth daily. 90 tablet 3  . BIOTIN PO Take by mouth.    . Calcium Citrate-Vitamin D (CALCIUM CITRATE + D3 MAXIMUM) 315-250 MG-UNIT TABS Take 1 tablet by mouth 2 (two) times daily. (Patient taking differently: Take 1 tablet by mouth 2 (two) times daily. With magnesium) 120 tablet   . Cholecalciferol (VITAMIN D PO) Take 5,000 Units by mouth daily.    . clobetasol (TEMOVATE) 0.05 % external solution Apply 1 application topically daily as needed.     . Cyanocobalamin (B-12) 1000 MCG SUBL Place under the tongue.    . diclofenac Sodium (VOLTAREN) 1 % GEL Apply 4 g topically 4 (four) times daily.    [name] Kitchen ezetimibe (ZETIA) 10 MG tablet Take 1 tablet (10 mg total) by mouth daily. 90 tablet 3  . gabapentin (NEURONTIN) 100 MG capsule TAKE 1 CAPSULE IN THE MORNING, 1 in the afternoon AND 2 CAPSULES AT BEDTIME 360 capsule 3  . Glucosamine-Chondroit-Vit C-Mn (GLUCOSAMINE CHONDR 1500 COMPLX PO) Take by mouth.    . icosapent Ethyl (VASCEPA) 1 g capsule TAKE (2) CAPSULES TWICE DAILY. 120 capsule  12  . levothyroxine (SYNTHROID) 50 MCG tablet TAKE 1/2 TAB ON MON, WED, FRI & 1 TAB ON TUES AND THURS 90 tablet 2  . losartan (COZAAR) 50 MG tablet Take 1 tablet (50 mg total) by mouth daily. 90 tablet 3  . meloxicam (MOBIC) 15 MG tablet Take 1 tablet (15 mg total) by mouth daily. (Patient taking differently: Take 15 mg by mouth as needed.) 90 tablet 3  . Multiple Vitamin (MULTI-VITAMIN PO) Take by mouth daily.    [name] Kitchen omeprazole (PRILOSEC) 20 MG capsule Take 1 capsule (20 mg total) by mouth 2 (two) times daily before a meal. 180 capsule 1  . Probiotic Product (PROBIOTIC DAILY PO) Take by mouth.     No current facility-administered medications for this visit.    Allergies as of 09/03/2020 - Review Complete 08/13/2020  Allergen Reaction  Noted  . Codeine  12/02/2010  . Femring [estradiol] Rash 12/11/2012  . Cephalexin Rash 12/02/2010  . Erythromycin Other (See Comments) 12/02/2010  . Penicillins Rash 12/02/2010    Family History  Problem Relation Age of Onset  . Diabetes Father   . Heart disease Father   . Heart attack Father 65  . Breast cancer Mother   . Breast cancer Paternal Grandmother        Aunt also   . Breast cancer Other        Family History  . Breast cancer Maternal Aunt   . Breast cancer Paternal Aunt   . Colon cancer Neg Hx     Social History   Socioeconomic History  . Marital status: Married    Spouse name: Not on file  . Number of children: 1  . Years of education: 46  . Highest education level: Bachelor's degree (e.g., BA, AB, BS)  Occupational History  . 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 Smoking status: Never Smoker  . Smokeless tobacco: Never Used  Vaping Use  . Vaping Use: Never used  Substance and Sexual Activity  . Alcohol use: Yes    Alcohol/week: 14.0 standard drinks    Types: 14 Glasses of wine per week  . Drug use: No  . Sexual activity: Never  Other Topics Concern  . Not on file  Social History Narrative  . Not on file   Social Determinants of Health   Financial Resource Strain: Not on file  Food Insecurity: Not on file  Transportation Needs: Not on file  Physical Activity: Not on file  Stress: Not on file  Social Connections: Not on file  Intimate Partner Violence: Not on file    Review of Systems:    Constitutional: No weight loss, fever or chills Skin: No rash  Cardiovascular: No chest pain Respiratory: No SOB  Gastrointestinal: See HPI and otherwise negative Genitourinary: No dysuria  Neurological: No headache, dizziness or syncope Musculoskeletal: No new muscle or joint pain Hematologic: No bleeding  Psychiatric: No history of depression or anxiety   Physical Exam:  Vital signs: BP (!)  160/70 (BP Location: Left Arm, Patient Position: Sitting, Cuff Size: Normal)   Pulse 72   Ht 5\' 1"  (1.549 m) Comment: height measured without shoes  Wt 143 lb 4 oz (65 kg)   BMI 27.07 kg/m   Constitutional:   Pleasant Elderly Caucasian female appears to be in NAD, Well developed, Well nourished, alert and cooperative Head:  Normocephalic and atraumatic. Eyes:   PEERL, EOMI. No icterus. Conjunctiva pink. Ears:  Normal auditory acuity. Neck:  Supple Throat: Oral cavity and pharynx without inflammation, swelling or lesion.  Respiratory: Respirations even and unlabored. Lungs clear to auscultation bilaterally.   No wheezes, crackles, or rhonchi.  Cardiovascular: Normal S1, S2. No MRG. Regular rate and rhythm. No peripheral edema, cyanosis or pallor.  Gastrointestinal:  Soft, nondistended, nontender. No rebound or guarding. Normal bowel sounds. No appreciable masses or hepatomegaly. Rectal:  Not performed.  Msk:  Symmetrical without gross deformities. Without edema, no deformity or joint abnormality.  Neurologic:  Alert and  oriented x4;  grossly normal neurologically.  Skin:   Dry and intact without significant lesions or rashes. Psychiatric: Demonstrates good judgement and reason without abnormal affect or behaviors.  RELEVANT LABS AND IMAGING: CBC    Component Value Date/Time   WBC 4.5 02/12/2020 0806   WBC 4.3 (A) 02/05/2015 0856   RBC 4.10 02/12/2020 0806   RBC 4.27 02/05/2015 0856   HGB 13.0 02/12/2020 0806   HCT 37.5 02/12/2020 0806   PLT 300 02/12/2020 0806   MCV 92 02/12/2020 0806   MCH 31.7 02/12/2020 0806   MCH 29.6 02/05/2015 0856   MCHC 34.7 02/12/2020 0806   MCHC 32.4 02/05/2015 0856   RDW 12.3 02/12/2020 0806   LYMPHSABS 1.6 02/01/2019 0837   EOSABS 0.1 02/01/2019 0837   BASOSABS 0.1 02/01/2019 0837    CMP     Component Value Date/Time   NA 137 08/15/2020 0806   K 4.6 08/15/2020 0806   CL 97 08/15/2020 0806   CO2 24 08/15/2020 0806   GLUCOSE 87 08/15/2020  0806   GLUCOSE 88 11/07/2012 0844   BUN 12 08/15/2020 0806   CREATININE 0.70 08/15/2020 0806   CREATININE 0.75 11/07/2012 0844   CALCIUM 9.8 08/15/2020 0806   PROT 7.3 08/15/2020 0806   ALBUMIN 4.8 (H) 08/15/2020 0806   AST 28 08/15/2020 0806   ALT 28 08/15/2020 0806   ALKPHOS 105 08/15/2020 0806   BILITOT 0.5 08/15/2020 0806   GFRNONAA 86 08/15/2020 0806   GFRAA 99 08/15/2020 0806    Assessment: 1.  Epigastric pain: With below 2.  GERD: Increase in symptoms over the past 2 months, some better with Omeprazole 20 mg twice daily; consider gastritis+/-PUD versus other  Plan: 1.  Scheduled patient for diagnostic EGD in the [place] with Dr. Bryan Lemma.  Provided the patient with a detailed list of risks for the procedure and she agrees to proceed.  She has had her Covid vaccines. 2.  Continue Omeprazole 20 mg twice daily, 30-60 minutes before breakfast and dinner. 3.  Reviewed antireflux diet and lifestyle modifications. 4.  Patient to follow in clinic per recommendations from Dr. Bryan Lemma after time of procedure.  Ellouise Newer, PA-C [facility] 09/03/2020, 10:44 AM  Cc: [name], DO

## 2020-09-11 NOTE — Progress Notes (Signed)
Agree with the assessment and plan as outlined by Jennifer Lemmon, PA-C. ? ?Ashanty Coltrane, DO, FACG ? ?

## 2020-09-15 ENCOUNTER — Encounter: Payer: Self-pay | Admitting: Gastroenterology

## 2020-09-17 DIAGNOSIS — M9903 Segmental and somatic dysfunction of lumbar region: Secondary | ICD-10-CM | POA: Diagnosis not present

## 2020-09-17 DIAGNOSIS — M9902 Segmental and somatic dysfunction of thoracic region: Secondary | ICD-10-CM | POA: Diagnosis not present

## 2020-09-17 DIAGNOSIS — M9901 Segmental and somatic dysfunction of cervical region: Secondary | ICD-10-CM | POA: Diagnosis not present

## 2020-09-17 DIAGNOSIS — M62830 Muscle spasm of back: Secondary | ICD-10-CM | POA: Diagnosis not present

## 2020-09-19 ENCOUNTER — Other Ambulatory Visit: Payer: Self-pay

## 2020-09-19 ENCOUNTER — Encounter: Payer: Self-pay | Admitting: Gastroenterology

## 2020-09-19 ENCOUNTER — Ambulatory Visit (AMBULATORY_SURGERY_CENTER): Payer: Medicare PPO | Admitting: Gastroenterology

## 2020-09-19 VITALS — BP 122/64 | HR 73 | Temp 97.8°F | Resp 17 | Ht 61.0 in | Wt 143.0 lb

## 2020-09-19 DIAGNOSIS — R12 Heartburn: Secondary | ICD-10-CM

## 2020-09-19 DIAGNOSIS — K319 Disease of stomach and duodenum, unspecified: Secondary | ICD-10-CM

## 2020-09-19 DIAGNOSIS — K208 Other esophagitis without bleeding: Secondary | ICD-10-CM | POA: Diagnosis not present

## 2020-09-19 DIAGNOSIS — K314 Gastric diverticulum: Secondary | ICD-10-CM

## 2020-09-19 DIAGNOSIS — K2970 Gastritis, unspecified, without bleeding: Secondary | ICD-10-CM

## 2020-09-19 DIAGNOSIS — E669 Obesity, unspecified: Secondary | ICD-10-CM | POA: Diagnosis not present

## 2020-09-19 DIAGNOSIS — K219 Gastro-esophageal reflux disease without esophagitis: Secondary | ICD-10-CM

## 2020-09-19 DIAGNOSIS — K3189 Other diseases of stomach and duodenum: Secondary | ICD-10-CM | POA: Diagnosis not present

## 2020-09-19 DIAGNOSIS — K317 Polyp of stomach and duodenum: Secondary | ICD-10-CM

## 2020-09-19 DIAGNOSIS — I10 Essential (primary) hypertension: Secondary | ICD-10-CM | POA: Diagnosis not present

## 2020-09-19 DIAGNOSIS — R1013 Epigastric pain: Secondary | ICD-10-CM

## 2020-09-19 DIAGNOSIS — R1310 Dysphagia, unspecified: Secondary | ICD-10-CM | POA: Diagnosis not present

## 2020-09-19 DIAGNOSIS — Q398 Other congenital malformations of esophagus: Secondary | ICD-10-CM

## 2020-09-19 MED ORDER — SODIUM CHLORIDE 0.9 % IV SOLN: 500.0000 mL | Freq: Once | INTRAVENOUS | Status: DC

## 2020-09-19 NOTE — Progress Notes (Signed)
VS by CW. ?

## 2020-09-19 NOTE — Progress Notes (Signed)
Called to room to assist during endoscopic procedure.  Patient ID and intended procedure confirmed with present staff. Received instructions for my participation in the procedure from the performing physician.  

## 2020-09-19 NOTE — Progress Notes (Signed)
Report to PACU, RN, vss, BBS= Clear.  

## 2020-09-19 NOTE — Op Note (Signed)
[facility] Patient Name: Marissa Perez Procedure Date: 09/19/2020 9:53 AM MRN: [id number] Endoscopist: Gerrit Heck , MD Age: 75 Referring MD:  Date of Birth: 07-23-1946 Gender: Female Account #: [id number] Procedure:                Upper GI endoscopy Indications:              Epigastric abdominal pain, Heartburn, Suspected                            esophageal reflux                           Symptoms have been responsive to trial of                            omeprazole 20 mg PO BID. Medicines:                Monitored Anesthesia Care Procedure:                Pre-Anesthesia Assessment:                           - Prior to the procedure, a History and Physical                            was performed, and patient medications and                            allergies were reviewed. The patient's tolerance of                            previous anesthesia was also reviewed. The risks                            and benefits of the procedure and the sedation                            options and risks were discussed with the patient.                            All questions were answered, and informed consent                            was obtained. Prior Anticoagulants: The patient has                            taken no previous anticoagulant or antiplatelet                            agents. ASA Grade Assessment: II - A patient with                            mild systemic disease. After reviewing the risks  and benefits, the patient was deemed in                            satisfactory condition to undergo the procedure.                           After obtaining informed consent, the endoscope was                            passed under direct vision. Throughout the                            procedure, the patient's blood pressure, pulse, and                            oxygen saturations were monitored continuously. The                             Endoscope was introduced through the mouth, and                            advanced to the second part of duodenum. The upper                            GI endoscopy was accomplished without difficulty.                            The patient tolerated the procedure well. Scope In: Scope Out: Findings:                 The Z-line was mildly irregular and was found 39 cm                            from the incisors. There was a single small island                            of salmon-colored mucosa. Biopsies were taken with                            a cold forceps for histology. Estimated blood loss                            was minimal.                           A single area of ectopic gastric mucosa was found                            in the upper third of the esophagus.                           Diffuse mild inflammation characterized by erythema  was found in the gastric body and in the gastric                            antrum. Biopsies were taken with a cold forceps for                            Helicobacter pylori testing. Estimated blood loss                            was minimal.                           A few small sessile polyps with no stigmata of                            recent bleeding were found in the gastric body.                            These polyps were removed with a cold biopsy                            forceps. Resection and retrieval were complete.                            Estimated blood loss was minimal.                           Nodular mucosa was found in the duodenal bulb.                            Biopsies were taken with a cold forceps for                            histology. Estimated blood loss was minimal.                           The second portion of the duodenum was normal. Complications:            No immediate complications. Estimated Blood Loss:     Estimated blood loss was minimal. Impression:                - Z-line irregular, 39 cm from the incisors.                            Biopsied.                           - Ectopic gastric mucosa in the upper third of the                            esophagus.                           - Gastritis. Biopsied.                           -  A few gastric polyps. Resected and retrieved.                           - Nodular mucosa in the duodenal bulb. Biopsied.                           - Normal second portion of the duodenum. Recommendation:           - Patient has a contact number available for                            emergencies. The signs and symptoms of potential                            delayed complications were discussed with the                            patient. Return to normal activities tomorrow.                            Written discharge instructions were provided to the                            patient.                           - Resume previous diet.                           - Continue present medications.                           - Await pathology results.                           - Follow-up with Ellouise Newer, PA-C in the GI                            clinic in 2-3 months, or sooner as needed. Gerrit Heck, MD 09/19/2020 10:19:49 AM

## 2020-09-19 NOTE — Patient Instructions (Signed)
Resume previous diet Continue current medications Await pathology results Make follow up appt with jennifer lemmons in 2-3 months  YOU HAD AN ENDOSCOPIC PROCEDURE TODAY AT [facility]:   Refer to the procedure report that was given to you for any specific questions about what was found during the examination.  If the procedure report does not answer your questions, please call your gastroenterologist to clarify.  If you requested that your care partner not be given the details of your procedure findings, then the procedure report has been included in a sealed envelope for you to review at your convenience later.  YOU SHOULD EXPECT: Some feelings of bloating in the abdomen. Passage of more gas than usual.  Walking can help get rid of the air that was put into your GI tract during the procedure and reduce the bloating. If you had a lower endoscopy (such as a colonoscopy or flexible sigmoidoscopy) you may notice spotting of blood in your stool or on the toilet paper. If you underwent a bowel prep for your procedure, you may not have a normal bowel movement for a few days.  Please Note:  You might notice some irritation and congestion in your nose or some drainage.  This is from the oxygen used during your procedure.  There is no need for concern and it should clear up in a day or so.  SYMPTOMS TO REPORT IMMEDIATELY:   Following upper endoscopy (EGD)  Vomiting of blood or coffee ground material  New chest pain or pain under the shoulder blades  Painful or persistently difficult swallowing  New shortness of breath  Fever of 100F or higher  Black, tarry-looking stools  For urgent or emergent issues, a gastroenterologist can be reached at any hour by calling [phone]. Do not use MyChart messaging for urgent concerns.   DIET:  We do recommend a small meal at first, but then you may proceed to your regular diet.  Drink plenty of fluids but you should avoid alcoholic beverages  for 24 hours.  ACTIVITY:  You should plan to take it easy for the rest of today and you should NOT DRIVE or use heavy machinery until tomorrow (because of the sedation medicines used during the test).    FOLLOW UP: Our staff will call the number listed on your records 48-72 hours following your procedure to check on you and address any questions or concerns that you may have regarding the information given to you following your procedure. If we do not reach you, we will leave a message.  We will attempt to reach you two times.  During this call, we will ask if you have developed any symptoms of COVID 19. If you develop any symptoms (ie: fever, flu-like symptoms, shortness of breath, cough etc.) before then, please call [phone].  If you test positive for Covid 19 in the 2 weeks post procedure, please call and report this information to [place].    If any biopsies were taken you will be contacted by phone or by letter within the next 1-3 weeks.  Please call us at [phone] if you have not heard about the biopsies in 3 weeks.   SIGNATURES/CONFIDENTIALITY: You and/or your care partner have signed paperwork which will be entered into your electronic medical record.  These signatures attest to the fact that that the information above on your After Visit Summary has been reviewed and is understood.  Full responsibility of the confidentiality of this discharge information lies with  you and/or your care-partner.

## 2020-09-23 ENCOUNTER — Telehealth: Payer: Self-pay | Admitting: *Deleted

## 2020-09-23 ENCOUNTER — Telehealth: Payer: Self-pay

## 2020-09-23 NOTE — Telephone Encounter (Signed)
First attempt follow up call to pt, lm on vm 

## 2020-09-23 NOTE — Telephone Encounter (Signed)
  Follow up Call-  Call back number 09/19/2020  Post procedure Call Back phone  # [phone]  Permission to leave phone message Yes  Some recent data might be hidden     Patient questions:  Do you have a fever, pain , or abdominal swelling? No. Pain Score  0 *  Have you tolerated food without any problems? Yes.    Have you been able to return to your normal activities? Yes.    Do you have any questions about your discharge instructions: Diet   No. Medications  No. Follow up visit  No.  Do you have questions or concerns about your Care? No.  Actions: * If pain score is 4 or above: No action needed, pain <4.  1. Have you developed a fever since your procedure? no  2.   Have you had an respiratory symptoms (SOB or cough) since your procedure? no  3.   Have you tested positive for COVID 19 since your procedure no  4.   Have you had any family members/close contacts diagnosed with the COVID 19 since your procedure?  no   If yes to any of these questions please route to Joylene John, RN and Joella Prince, RN

## 2020-10-01 ENCOUNTER — Encounter: Payer: Self-pay | Admitting: Gastroenterology

## 2020-10-01 DIAGNOSIS — M9901 Segmental and somatic dysfunction of cervical region: Secondary | ICD-10-CM | POA: Diagnosis not present

## 2020-10-01 DIAGNOSIS — M9903 Segmental and somatic dysfunction of lumbar region: Secondary | ICD-10-CM | POA: Diagnosis not present

## 2020-10-01 DIAGNOSIS — M9902 Segmental and somatic dysfunction of thoracic region: Secondary | ICD-10-CM | POA: Diagnosis not present

## 2020-10-01 DIAGNOSIS — M62830 Muscle spasm of back: Secondary | ICD-10-CM | POA: Diagnosis not present

## 2020-10-13 DIAGNOSIS — M9902 Segmental and somatic dysfunction of thoracic region: Secondary | ICD-10-CM | POA: Diagnosis not present

## 2020-10-13 DIAGNOSIS — M62830 Muscle spasm of back: Secondary | ICD-10-CM | POA: Diagnosis not present

## 2020-10-13 DIAGNOSIS — M9903 Segmental and somatic dysfunction of lumbar region: Secondary | ICD-10-CM | POA: Diagnosis not present

## 2020-10-13 DIAGNOSIS — M9901 Segmental and somatic dysfunction of cervical region: Secondary | ICD-10-CM | POA: Diagnosis not present

## 2020-10-14 DIAGNOSIS — M9903 Segmental and somatic dysfunction of lumbar region: Secondary | ICD-10-CM | POA: Diagnosis not present

## 2020-10-14 DIAGNOSIS — M9902 Segmental and somatic dysfunction of thoracic region: Secondary | ICD-10-CM | POA: Diagnosis not present

## 2020-10-14 DIAGNOSIS — M9901 Segmental and somatic dysfunction of cervical region: Secondary | ICD-10-CM | POA: Diagnosis not present

## 2020-10-14 DIAGNOSIS — M62830 Muscle spasm of back: Secondary | ICD-10-CM | POA: Diagnosis not present

## 2020-10-23 DIAGNOSIS — M1711 Unilateral primary osteoarthritis, right knee: Secondary | ICD-10-CM | POA: Diagnosis not present

## 2020-10-27 DIAGNOSIS — M62830 Muscle spasm of back: Secondary | ICD-10-CM | POA: Diagnosis not present

## 2020-10-27 DIAGNOSIS — M9902 Segmental and somatic dysfunction of thoracic region: Secondary | ICD-10-CM | POA: Diagnosis not present

## 2020-10-27 DIAGNOSIS — M9903 Segmental and somatic dysfunction of lumbar region: Secondary | ICD-10-CM | POA: Diagnosis not present

## 2020-10-27 DIAGNOSIS — M9901 Segmental and somatic dysfunction of cervical region: Secondary | ICD-10-CM | POA: Diagnosis not present

## 2020-10-30 DIAGNOSIS — M1711 Unilateral primary osteoarthritis, right knee: Secondary | ICD-10-CM | POA: Diagnosis not present

## 2020-11-05 ENCOUNTER — Encounter: Payer: Self-pay | Admitting: Family Medicine

## 2020-11-06 DIAGNOSIS — M1711 Unilateral primary osteoarthritis, right knee: Secondary | ICD-10-CM | POA: Diagnosis not present

## 2020-11-12 DIAGNOSIS — M9902 Segmental and somatic dysfunction of thoracic region: Secondary | ICD-10-CM | POA: Diagnosis not present

## 2020-11-12 DIAGNOSIS — M62830 Muscle spasm of back: Secondary | ICD-10-CM | POA: Diagnosis not present

## 2020-11-12 DIAGNOSIS — M9903 Segmental and somatic dysfunction of lumbar region: Secondary | ICD-10-CM | POA: Diagnosis not present

## 2020-11-12 DIAGNOSIS — M9901 Segmental and somatic dysfunction of cervical region: Secondary | ICD-10-CM | POA: Diagnosis not present

## 2020-11-26 DIAGNOSIS — M9902 Segmental and somatic dysfunction of thoracic region: Secondary | ICD-10-CM | POA: Diagnosis not present

## 2020-11-26 DIAGNOSIS — M62830 Muscle spasm of back: Secondary | ICD-10-CM | POA: Diagnosis not present

## 2020-11-26 DIAGNOSIS — M9903 Segmental and somatic dysfunction of lumbar region: Secondary | ICD-10-CM | POA: Diagnosis not present

## 2020-11-26 DIAGNOSIS — M9901 Segmental and somatic dysfunction of cervical region: Secondary | ICD-10-CM | POA: Diagnosis not present

## 2020-12-10 DIAGNOSIS — M62830 Muscle spasm of back: Secondary | ICD-10-CM | POA: Diagnosis not present

## 2020-12-10 DIAGNOSIS — M9903 Segmental and somatic dysfunction of lumbar region: Secondary | ICD-10-CM | POA: Diagnosis not present

## 2020-12-10 DIAGNOSIS — M9901 Segmental and somatic dysfunction of cervical region: Secondary | ICD-10-CM | POA: Diagnosis not present

## 2020-12-10 DIAGNOSIS — M9902 Segmental and somatic dysfunction of thoracic region: Secondary | ICD-10-CM | POA: Diagnosis not present

## 2020-12-18 DIAGNOSIS — M9901 Segmental and somatic dysfunction of cervical region: Secondary | ICD-10-CM | POA: Diagnosis not present

## 2020-12-18 DIAGNOSIS — M62830 Muscle spasm of back: Secondary | ICD-10-CM | POA: Diagnosis not present

## 2020-12-18 DIAGNOSIS — M9902 Segmental and somatic dysfunction of thoracic region: Secondary | ICD-10-CM | POA: Diagnosis not present

## 2020-12-18 DIAGNOSIS — M9903 Segmental and somatic dysfunction of lumbar region: Secondary | ICD-10-CM | POA: Diagnosis not present

## 2021-01-01 DIAGNOSIS — M9903 Segmental and somatic dysfunction of lumbar region: Secondary | ICD-10-CM | POA: Diagnosis not present

## 2021-01-01 DIAGNOSIS — M9902 Segmental and somatic dysfunction of thoracic region: Secondary | ICD-10-CM | POA: Diagnosis not present

## 2021-01-01 DIAGNOSIS — M62830 Muscle spasm of back: Secondary | ICD-10-CM | POA: Diagnosis not present

## 2021-01-01 DIAGNOSIS — M9901 Segmental and somatic dysfunction of cervical region: Secondary | ICD-10-CM | POA: Diagnosis not present

## 2021-01-15 DIAGNOSIS — M9901 Segmental and somatic dysfunction of cervical region: Secondary | ICD-10-CM | POA: Diagnosis not present

## 2021-01-15 DIAGNOSIS — M62830 Muscle spasm of back: Secondary | ICD-10-CM | POA: Diagnosis not present

## 2021-01-15 DIAGNOSIS — M9902 Segmental and somatic dysfunction of thoracic region: Secondary | ICD-10-CM | POA: Diagnosis not present

## 2021-01-15 DIAGNOSIS — M9903 Segmental and somatic dysfunction of lumbar region: Secondary | ICD-10-CM | POA: Diagnosis not present

## 2021-01-28 ENCOUNTER — Ambulatory Visit (INDEPENDENT_AMBULATORY_CARE_PROVIDER_SITE_OTHER): Payer: Medicare PPO

## 2021-01-28 ENCOUNTER — Encounter: Payer: Self-pay | Admitting: Family Medicine

## 2021-01-28 ENCOUNTER — Other Ambulatory Visit: Payer: Self-pay | Admitting: Family Medicine

## 2021-01-28 VITALS — Ht 61.0 in | Wt 138.0 lb

## 2021-01-28 DIAGNOSIS — Z0000 Encounter for general adult medical examination without abnormal findings: Secondary | ICD-10-CM

## 2021-01-28 DIAGNOSIS — I10 Essential (primary) hypertension: Secondary | ICD-10-CM

## 2021-01-28 DIAGNOSIS — E782 Mixed hyperlipidemia: Secondary | ICD-10-CM

## 2021-01-28 DIAGNOSIS — Z1231 Encounter for screening mammogram for malignant neoplasm of breast: Secondary | ICD-10-CM | POA: Diagnosis not present

## 2021-01-28 DIAGNOSIS — E034 Atrophy of thyroid (acquired): Secondary | ICD-10-CM

## 2021-01-28 DIAGNOSIS — Z130 Encounter for screening for diseases of the blood and blood-forming organs and certain disorders involving the immune mechanism: Secondary | ICD-10-CM

## 2021-01-28 NOTE — Progress Notes (Signed)
Subjective:   LARINA LIEURANCE is a 75 y.o. female who presents for Medicare Annual (Subsequent) preventive examination.  Virtual Visit via Telephone Note  I connected with  HILARIE SINHA on 01/28/21 at  1:15 PM EDT by telephone and verified that I am speaking with the correct person using two identifiers.  Location: Patient: Home Provider: [facility] Persons participating in the virtual visit: patient/Nurse Health Advisor   I discussed the limitations, risks, security and privacy concerns of performing an evaluation and management service by telephone and the availability of in person appointments. The patient expressed understanding and agreed to proceed.  Interactive audio and video telecommunications were attempted between this nurse and patient, however failed, due to patient having technical difficulties OR patient did not have access to video capability.  We continued and completed visit with audio only.  Some vital signs may be absent or patient reported.   Parley Pidcock E Jeray Shugart, LPN   Review of Systems     Cardiac Risk Factors include: advanced age (>36men, >56 women);hypertension;dyslipidemia     Objective:    Today's Vitals   01/28/21 1312  Weight: 138 lb (62.6 kg)  Height: 5\' 1"  (1.549 m)   Body mass index is 26.07 kg/m.  Advanced Directives 01/28/2020 01/12/2019 01/10/2018 01/03/2017 12/12/2015  Does Patient Have a Medical Advance Directive? Yes Yes Yes Yes Yes  Type of Paramedic of [place];Living will [place];Living will [place];Living will [place];Living will [place];Living will  Does patient want to make changes to medical advance directive? No - Patient declined No - Patient declined - - No - Patient declined  Copy of [place] in Chart? No - copy requested Yes - validated most recent copy scanned in chart (See row information) No - copy requested  No - copy requested No - copy requested    Current Medications (verified) Outpatient Encounter Medications as of 01/28/2021  Medication Sig   atorvastatin (LIPITOR) 20 MG tablet Take 1 tablet (20 mg total) by mouth daily.   BIOTIN PO Take by mouth.   Calcium Citrate-Vitamin D (CALCIUM CITRATE + D3 MAXIMUM) 315-250 MG-UNIT TABS Take 1 tablet by mouth 2 (two) times daily. (Patient taking differently: Take 1 tablet by mouth 2 (two) times daily. With magnesium)   Cholecalciferol (VITAMIN D PO) Take 5,000 Units by mouth daily.   clobetasol (TEMOVATE) 0.05 % external solution Apply 1 application topically daily as needed.    Cyanocobalamin (B-12) 1000 MCG SUBL Place under the tongue.   diclofenac Sodium (VOLTAREN) 1 % GEL Apply 4 g topically 4 (four) times daily.   ezetimibe (ZETIA) 10 MG tablet Take 1 tablet (10 mg total) by mouth daily.   gabapentin (NEURONTIN) 100 MG capsule TAKE 1 CAPSULE IN THE MORNING, 1 in the afternoon AND 2 CAPSULES AT BEDTIME   Glucosamine-Chondroit-Vit C-Mn (GLUCOSAMINE CHONDR 1500 COMPLX PO) Take by mouth.   icosapent Ethyl (VASCEPA) 1 g capsule TAKE (2) CAPSULES TWICE DAILY.   levothyroxine (SYNTHROID) 50 MCG tablet TAKE 1/2 TAB ON MON, WED, FRI & 1 TAB ON TUES AND THURS   losartan (COZAAR) 50 MG tablet Take 1 tablet (50 mg total) by mouth daily.   meloxicam (MOBIC) 15 MG tablet Take 1 tablet (15 mg total) by mouth daily. (Patient taking differently: Take 15 mg by mouth as needed.)   Multiple Vitamin (MULTI-VITAMIN PO) Take by mouth daily.   omeprazole (PRILOSEC) 20 MG capsule Take  1 capsule (20 mg total) by mouth 2 (two) times daily before a meal.   Probiotic Product (PROBIOTIC DAILY PO) Take by mouth.   No facility-administered encounter medications on file as of 01/28/2021.    Allergies (verified) Codeine, Femring [estradiol], Cephalexin, Erythromycin, and Penicillins   History: Past Medical History:  Diagnosis Date   Acid reflux    Allergy    Arthritis     Diverticulosis    GERD (gastroesophageal reflux disease)    History of ETT 7/10   abnormal    History of stomach ulcers 2008   positive h. pylori   Hyperlipidemia    Hypertension    Hypothyroidism    IBS (irritable bowel syndrome)    Interstitial cystitis    Skin cancer    basal and squamous   Past Surgical History:  Procedure Laterality Date   CESAREAN SECTION  1972   MOHS SURGERY     TONSILLECTOMY  1951   TOTAL ABDOMINAL HYSTERECTOMY W/ BILATERAL SALPINGOOPHORECTOMY  1990   Dr. Tamala Julian / fibroids    Family History  Problem Relation Age of Onset   Diabetes Father    Heart disease Father    Heart attack Father 62   Breast cancer Mother    Breast cancer Paternal Grandmother        Aunt also    Breast cancer Other        Family History   Breast cancer Maternal Aunt    Breast cancer Paternal Aunt    Colon cancer Neg Hx    Esophageal cancer Neg Hx    Rectal cancer Neg Hx    Stomach cancer Neg Hx    Social History   Socioeconomic History   Marital status: Married    Spouse name: [name]"   Number of children: 1   Years of education: 16   Highest education level: Bachelor's degree (e.g., BA, AB, BS)  Occupational History   Occupation: Retired    Fish farm manager: Engineer, water    Comment: Teacher  Tobacco Use   Smoking status: Never   Smokeless tobacco: Never  Vaping Use   Vaping Use: Never used  Substance and Sexual Activity   Alcohol use: Yes    Alcohol/week: 14.0 standard drinks    Types: 14 Glasses of wine per week    Comment: 2 per day   Drug use: No   Sexual activity: Never  Other Topics Concern   Not on file  Social History Narrative   Lives home with her husband "[name]" Their son lives in [place]. One grandchild   Social Determinants of Radio broadcast assistant Strain: Low Risk    Difficulty of Paying Living Expenses: Not hard at all  Food Insecurity: No Food Insecurity   Worried About Charity fundraiser in the Last Year: Never true    Arboriculturist in the Last Year: Never true  Transportation Needs: No Transportation Needs   Lack of Transportation (Medical): No   Lack of Transportation (Non-Medical): No  Physical Activity: Insufficiently Active   Days of Exercise per Week: 7 days   Minutes of Exercise per Session: 20 min  Stress: No Stress Concern Present   Feeling of Stress : Only a little  Social Connections: Engineer, building services of Communication with Friends and Family: More than three times a week   Frequency of Social Gatherings with Friends and Family: More than three times a week   Attends Religious Services: More than 4  times per year   Active Member of Clubs or Organizations: Yes   Attends Archivist Meetings: More than 4 times per year   Marital Status: Married    Tobacco Counseling Counseling given: Not Answered   Clinical Intake:  Pre-visit preparation completed: Yes  Pain : No/denies pain     BMI - recorded: 26.07 Nutritional Status: BMI 25 -29 Overweight Nutritional Risks: None Diabetes: No  How often do you need to have someone help you when you read instructions, pamphlets, or other written materials from your doctor or pharmacy?: 1 - Never  Diabetic? No  Interpreter Needed?: No  Information entered by :: Lasheka Kempner, LPN   Activities of Daily Living In your present state of health, do you have any difficulty performing the following activities: 01/28/2021  Hearing? N  Vision? N  Difficulty concentrating or making decisions? N  Walking or climbing stairs? Y  Dressing or bathing? N  Doing errands, shopping? N  Preparing Food and eating ? N  Using the Toilet? N  In the past six months, have you accidently leaked urine? N  Do you have problems with loss of bowel control? N  Managing your Medications? N  Managing your Finances? N  Housekeeping or managing your Housekeeping? N  Some recent data might be hidden    Patient Care Team: [name],  DO as PCP - General (Family Medicine) Druscilla Brownie, MD as Consulting Physician (Dermatology) Inda Castle, MD (Inactive) as Consulting Physician (Gastroenterology) Debbra Riding, MD as Consulting Physician (Ophthalmology) Lavena Bullion, DO as Consulting Physician (Gastroenterology) Ralene Muskrat as Physician Assistant (Chiropractic Medicine)  Indicate any recent Medical Services you may have received from other than [facility] providers in the past year (date may be approximate).     Assessment:   This is a routine wellness examination for [place].  Hearing/Vision screen Hearing Screening - Comments:: Denies hearing difficulties  Vision Screening - Comments:: Wears eyeglasses - up to date with annual eye exam with Dr Katy Fitch  Dietary issues and exercise activities discussed: Current Exercise Habits: Home exercise routine, Type of exercise: walking, Time (Minutes): 30, Frequency (Times/Week): 7, Weekly Exercise (Minutes/Week): 210, Intensity: Mild, Exercise limited by: orthopedic condition(s)   Goals Addressed             This Visit's Progress    Exercise 150 min/wk Moderate Activity   On track    Increase water intake   On track    Aim for drinking half of your body weight in ounces of water daily.        Patient Stated   On track    01/28/2020 AWV Goal: Fall Prevention  Over the next year, patient will decrease their risk for falls by: Using assistive devices, such as a cane or walker, as needed Identifying fall risks within their home and correcting them by: Removing throw rugs Adding handrails to stairs or ramps Removing clutter and keeping a clear pathway throughout the home Increasing light, especially at night Adding shower handles/bars Raising toilet seat Identifying potential personal risk factors for falls: Medication side effects Incontinence/urgency Vestibular dysfunction Hearing loss Musculoskeletal disorders Neurological  disorders Orthostatic hypotension          Depression Screen PHQ 2/9 Scores 01/28/2021 08/13/2020 02/11/2020 01/28/2020 08/13/2019 05/04/2019 01/12/2019  PHQ - 2 Score 0 0 0 0 0 0 1  PHQ- 9 Score - 0 0 - 0 0 -    Fall Risk Fall Risk  01/28/2021 08/13/2020  02/11/2020 02/11/2020 01/28/2020  Falls in the past year? 0 1 1 0 0  Number falls in past yr: 0 0 0 - -  Injury with Fall? 0 1 1 - -  Risk for fall due to : Impaired vision;Medication side effect;Orthopedic patient History of fall(s) History of fall(s) - -  Follow up Falls prevention discussed Falls prevention discussed Falls prevention discussed - Falls evaluation completed    FALL RISK PREVENTION PERTAINING TO THE HOME:  Any stairs in or around the home? No  If so, are there any without handrails? No  Home free of loose throw rugs in walkways, pet beds, electrical cords, etc? Yes  Adequate lighting in your home to reduce risk of falls? Yes   ASSISTIVE DEVICES UTILIZED TO PREVENT FALLS:  Life alert? No  Use of a cane, walker or w/c? No  Grab bars in the bathroom? Yes  Shower chair or bench in shower? Yes  Elevated toilet seat or a handicapped toilet? Yes   TIMED UP AND GO:  Was the test performed? No . Tleephonic visit  Cognitive Function: MMSE - Mini Mental State Exam 01/10/2018 01/03/2017 12/12/2015  Orientation to time 5 5 5   Orientation to Place 5 5 5   Registration 3 3 3   Attention/ Calculation 5 5 5   Recall 3 3 3   Language- name 2 objects 2 2 2   Language- repeat 1 1 1   Language- follow 3 step command 3 3 3   Language- read & follow direction 1 1 1   Write a sentence 1 1 1   Copy design 1 1 1   Total score 30 30 30      6CIT Screen 01/28/2020 01/12/2019  What Year? 0 points 0 points  What month? 0 points 0 points  What time? 0 points 0 points  Count back from 20 0 points 0 points  Months in reverse 0 points 0 points  Repeat phrase 0 points 0 points  Total Score 0 0    Immunizations Immunization History   Administered Date(s) Administered   Fluad Quad(high Dose 65+) 05/04/2019, 05/28/2020   Influenza Split 05/21/2013   Influenza, High Dose Seasonal PF 05/10/2017, 05/08/2018   Influenza, Quadrivalent, Recombinant, Inj, Pf 05/06/2016   Influenza,inj,Quad PF,6+ Mos 05/02/2015   Influenza,inj,quad, With Preservative 05/10/2014, 05/02/2017   PFIZER(Purple Top)SARS-COV-2 Vaccination 08/22/2019, 09/10/2019, 05/28/2020   Pneumococcal Conjugate-13 08/29/2013   Pneumococcal Polysaccharide-23 01/31/2011   Tdap 02/03/2011   Zoster Recombinat (Shingrix) 01/03/2017, 04/11/2017    TDAP status: Up to date  Flu Vaccine status: Up to date  Pneumococcal vaccine status: Up to date  Covid-19 vaccine status: Completed vaccines  Qualifies for Shingles Vaccine? Yes   Zostavax completed Yes   Shingrix Completed?: Yes  Screening Tests Health Maintenance  Topic Date Due   COVID-19 Vaccine (4 - Booster for Pfizer series) 08/28/2020   TETANUS/TDAP  02/10/2021   INFLUENZA VACCINE  03/02/2021   MAMMOGRAM  03/03/2021   COLON CANCER SCREENING ANNUAL FOBT  08/15/2021   DEXA SCAN  08/14/2022   COLONOSCOPY (Pts 45-6yrs Insurance coverage will need to be confirmed)  10/11/2023   Hepatitis C Screening  Completed   PNA vac Low Risk Adult  Completed   Zoster Vaccines- Shingrix  Completed   HPV VACCINES  Aged Out    Health Maintenance  Health Maintenance Due  Topic Date Due   COVID-19 Vaccine (4 - Booster for [place] series) 08/28/2020    Colorectal cancer screening: Type of screening: Colonoscopy. Completed 10/10/2013. Repeat every 10 years  Mammogram  status: Completed 03/03/2020. Repeat every year  Bone Density status: Completed 08/14/2020. Results reflect: Bone density results: OSTEOPENIA. Repeat every 2 years.  Lung Cancer Screening: (Low Dose CT Chest recommended if Age 28-80 years, 30 pack-year currently smoking OR have quit w/in 15years.) does not qualify.   Additional Screening:  Hepatitis C  Screening: does qualify; Completed 11/25/2016  Vision Screening: Recommended annual ophthalmology exams for early detection of glaucoma and other disorders of the eye. Is the patient up to date with their annual eye exam?  Yes  Who is the provider or what is the name of the office in which the patient attends annual eye exams? [facility] If pt is not established with a provider, would they like to be referred to a provider to establish care? No .   Dental Screening: Recommended annual dental exams for proper oral hygiene  Community Resource Referral / Chronic Care Management: CRR required this visit?  No   CCM required this visit?  No      Plan:     I have personally reviewed and noted the following in the patient's chart:   Medical and social history Use of alcohol, tobacco or illicit drugs  Current medications and supplements including opioid prescriptions.  Functional ability and status Nutritional status Physical activity Advanced directives List of other physicians Hospitalizations, surgeries, and ER visits in previous 12 months Vitals Screenings to include cognitive, depression, and falls Referrals and appointments  In addition, I have reviewed and discussed with patient certain preventive protocols, quality metrics, and best practice recommendations. A written personalized care plan for preventive services as well as general preventive health recommendations were provided to patient.     Sandrea Hammond, LPN   01/17/4858   Nurse Notes: None

## 2021-01-28 NOTE — Patient Instructions (Signed)
[name] , Thank you for taking time to come for your Medicare Wellness Visit. I appreciate your ongoing commitment to your health goals. Please review the following plan we discussed and let me know if I can assist you in the future.   Screening recommendations/referrals: Colonoscopy: Done 10/10/2013 - Repeat in 10 years Mammogram: Done 03/03/2020 - Repeat annually Bone Density: Done 08/14/2020 - Repeat every 2 years Recommended yearly ophthalmology/optometry visit for glaucoma screening and checkup Recommended yearly dental visit for hygiene and checkup  Vaccinations: Influenza vaccine: Done 05/28/2020 - Repeat annually Pneumococcal vaccine: Done 01/31/2011 & 08/29/2013 Tdap vaccine: Done 02/03/2011 - Repeat in 10 years Shingles vaccine: Done 01/03/2017 & 04/11/2017   Covid-19: Done 08/22/19, 09/10/2019. & 05/28/2020 - get second booster soon  Conditions/risks identified: Aim for 30 minutes of exercise or brisk walking each day, drink 6-8 glasses of water and eat lots of fruits and vegetables.  Next appointment: Follow up in one year for your annual wellness visit    Preventive Care 65 Years and Older, Female Preventive care refers to lifestyle choices and visits with your health care provider that can promote health and wellness. What does preventive care include? A yearly physical exam. This is also called an annual well check. Dental exams once or twice a year. Routine eye exams. Ask your health care provider how often you should have your eyes checked. Personal lifestyle choices, including: Daily care of your teeth and gums. Regular physical activity. Eating a healthy diet. Avoiding tobacco and drug use. Limiting alcohol use. Practicing safe sex. Taking low-dose aspirin every day. Taking vitamin and mineral supplements as recommended by your health care provider. What happens during an annual well check? The services and screenings done by your health care provider during your annual  well check will depend on your age, overall health, lifestyle risk factors, and family history of disease. Counseling  Your health care provider may ask you questions about your: Alcohol use. Tobacco use. Drug use. Emotional well-being. Home and relationship well-being. Sexual activity. Eating habits. History of falls. Memory and ability to understand (cognition). Work and work Statistician. Reproductive health. Screening  You may have the following tests or measurements: Height, weight, and BMI. Blood pressure. Lipid and cholesterol levels. These may be checked every 5 years, or more frequently if you are over 60 years old. Skin check. Lung cancer screening. You may have this screening every year starting at age 1 if you have a 30-pack-year history of smoking and currently smoke or have quit within the past 15 years. Fecal occult blood test (FOBT) of the stool. You may have this test every year starting at age 58. Flexible sigmoidoscopy or colonoscopy. You may have a sigmoidoscopy every 5 years or a colonoscopy every 10 years starting at age 75. Hepatitis C blood test. Hepatitis B blood test. Sexually transmitted disease (STD) testing. Diabetes screening. This is done by checking your blood sugar (glucose) after you have not eaten for a while (fasting). You may have this done every 1-3 years. Bone density scan. This is done to screen for osteoporosis. You may have this done starting at age 20. Mammogram. This may be done every 1-2 years. Talk to your health care provider about how often you should have regular mammograms. Talk with your health care provider about your test results, treatment options, and if necessary, the need for more tests. Vaccines  Your health care provider may recommend certain vaccines, such as: Influenza vaccine. This is recommended every year. Tetanus,  diphtheria, and acellular pertussis (Tdap, Td) vaccine. You may need a Td booster every 10 years. Zoster  vaccine. You may need this after age 62. Pneumococcal 13-valent conjugate (PCV13) vaccine. One dose is recommended after age 79. Pneumococcal polysaccharide (PPSV23) vaccine. One dose is recommended after age 65. Talk to your health care provider about which screenings and vaccines you need and how often you need them. This information is not intended to replace advice given to you by your health care provider. Make sure you discuss any questions you have with your health care provider. Document Released: 08/15/2015 Document Revised: 04/07/2016 Document Reviewed: 05/20/2015 Elsevier Interactive Patient Education  2017 [place] Prevention in the Home Falls can cause injuries. They can happen to people of all ages. There are many things you can do to make your home safe and to help prevent falls. What can I do on the outside of my home? Regularly fix the edges of walkways and driveways and fix any cracks. Remove anything that might make you trip as you walk through a door, such as a raised step or threshold. Trim any bushes or trees on the path to your home. Use bright outdoor lighting. Clear any walking paths of anything that might make someone trip, such as rocks or tools. Regularly check to see if handrails are loose or broken. Make sure that both sides of any steps have handrails. Any raised decks and porches should have guardrails on the edges. Have any leaves, snow, or ice cleared regularly. Use sand or salt on walking paths during winter. Clean up any spills in your garage right away. This includes oil or grease spills. What can I do in the bathroom? Use night lights. Install grab bars by the toilet and in the tub and shower. Do not use towel bars as grab bars. Use non-skid mats or decals in the tub or shower. If you need to sit down in the shower, use a plastic, non-slip stool. Keep the floor dry. Clean up any water that spills on the floor as soon as it happens. Remove  soap buildup in the tub or shower regularly. Attach bath mats securely with double-sided non-slip rug tape. Do not have throw rugs and other things on the floor that can make you trip. What can I do in the bedroom? Use night lights. Make sure that you have a light by your bed that is easy to reach. Do not use any sheets or blankets that are too big for your bed. They should not hang down onto the floor. Have a firm chair that has side arms. You can use this for support while you get dressed. Do not have throw rugs and other things on the floor that can make you trip. What can I do in the kitchen? Clean up any spills right away. Avoid walking on wet floors. Keep items that you use a lot in easy-to-reach places. If you need to reach something above you, use a strong step stool that has a grab bar. Keep electrical cords out of the way. Do not use floor polish or wax that makes floors slippery. If you must use wax, use non-skid floor wax. Do not have throw rugs and other things on the floor that can make you trip. What can I do with my stairs? Do not leave any items on the stairs. Make sure that there are handrails on both sides of the stairs and use them. Fix handrails that are broken or loose. Make  sure that handrails are as long as the stairways. Check any carpeting to make sure that it is firmly attached to the stairs. Fix any carpet that is loose or worn. Avoid having throw rugs at the top or bottom of the stairs. If you do have throw rugs, attach them to the floor with carpet tape. Make sure that you have a light switch at the top of the stairs and the bottom of the stairs. If you do not have them, ask someone to add them for you. What else can I do to help prevent falls? Wear shoes that: Do not have high heels. Have rubber bottoms. Are comfortable and fit you well. Are closed at the toe. Do not wear sandals. If you use a stepladder: Make sure that it is fully opened. Do not climb a  closed stepladder. Make sure that both sides of the stepladder are locked into place. Ask someone to hold it for you, if possible. Clearly mark and make sure that you can see: Any grab bars or handrails. First and last steps. Where the edge of each step is. Use tools that help you move around (mobility aids) if they are needed. These include: Canes. Walkers. Scooters. Crutches. Turn on the lights when you go into a dark area. Replace any light bulbs as soon as they burn out. Set up your furniture so you have a clear path. Avoid moving your furniture around. If any of your floors are uneven, fix them. If there are any pets around you, be aware of where they are. Review your medicines with your doctor. Some medicines can make you feel dizzy. This can increase your chance of falling. Ask your doctor what other things that you can do to help prevent falls. This information is not intended to replace advice given to you by your health care provider. Make sure you discuss any questions you have with your health care provider. Document Released: 05/15/2009 Document Revised: 12/25/2015 Document Reviewed: 08/23/2014 Elsevier Interactive Patient Education  2017 [employer].

## 2021-01-29 DIAGNOSIS — M9903 Segmental and somatic dysfunction of lumbar region: Secondary | ICD-10-CM | POA: Diagnosis not present

## 2021-01-29 DIAGNOSIS — M9902 Segmental and somatic dysfunction of thoracic region: Secondary | ICD-10-CM | POA: Diagnosis not present

## 2021-01-29 DIAGNOSIS — M9901 Segmental and somatic dysfunction of cervical region: Secondary | ICD-10-CM | POA: Diagnosis not present

## 2021-01-29 DIAGNOSIS — M62830 Muscle spasm of back: Secondary | ICD-10-CM | POA: Diagnosis not present

## 2021-02-03 ENCOUNTER — Other Ambulatory Visit: Payer: Self-pay | Admitting: Family Medicine

## 2021-02-04 ENCOUNTER — Other Ambulatory Visit: Payer: Self-pay

## 2021-02-04 ENCOUNTER — Other Ambulatory Visit: Payer: Medicare PPO

## 2021-02-04 DIAGNOSIS — E782 Mixed hyperlipidemia: Secondary | ICD-10-CM

## 2021-02-04 DIAGNOSIS — Z130 Encounter for screening for diseases of the blood and blood-forming organs and certain disorders involving the immune mechanism: Secondary | ICD-10-CM | POA: Diagnosis not present

## 2021-02-04 DIAGNOSIS — E034 Atrophy of thyroid (acquired): Secondary | ICD-10-CM

## 2021-02-04 DIAGNOSIS — I10 Essential (primary) hypertension: Secondary | ICD-10-CM | POA: Diagnosis not present

## 2021-02-05 LAB — LIPID PANEL
Chol/HDL Ratio: 2.3 ratio (ref 0.0–4.4)
Cholesterol, Total: 158 mg/dL (ref 100–199)
HDL: 69 mg/dL (ref >39)
LDL Chol Calc (NIH): 71 mg/dL (ref 0–99)
Triglycerides: 101 mg/dL (ref 0–149)
VLDL Cholesterol Cal: 18 mg/dL (ref 5–40)

## 2021-02-05 LAB — CBC
Hematocrit: 37.3 % (ref 34.0–46.6)
Hemoglobin: 12.7 g/dL (ref 11.1–15.9)
MCH: 31.1 pg (ref 26.6–33.0)
MCHC: 34 g/dL (ref 31.5–35.7)
MCV: 91 fL (ref 79–97)
Platelets: 292 10*3/uL (ref 150–450)
RBC: 4.09 x10E6/uL (ref 3.77–5.28)
RDW: 12 % (ref 11.7–15.4)
WBC: 5.2 10*3/uL (ref 3.4–10.8)

## 2021-02-05 LAB — BASIC METABOLIC PANEL
BUN/Creatinine Ratio: 17 (ref 12–28)
BUN: 12 mg/dL (ref 8–27)
CO2: 22 mmol/L (ref 20–29)
Calcium: 9.5 mg/dL (ref 8.7–10.3)
Chloride: 100 mmol/L (ref 96–106)
Creatinine, Ser: 0.69 mg/dL (ref 0.57–1.00)
Glucose: 96 mg/dL (ref 65–99)
Potassium: 4.5 mmol/L (ref 3.5–5.2)
Sodium: 138 mmol/L (ref 134–144)
eGFR: 90 mL/min/{1.73_m2} (ref >59)

## 2021-02-05 LAB — T4, FREE: Free T4: 1.16 ng/dL (ref 0.82–1.77)

## 2021-02-05 LAB — TSH: TSH: 3 u[IU]/mL (ref 0.450–4.500)

## 2021-02-09 DIAGNOSIS — M9903 Segmental and somatic dysfunction of lumbar region: Secondary | ICD-10-CM | POA: Diagnosis not present

## 2021-02-09 DIAGNOSIS — M9901 Segmental and somatic dysfunction of cervical region: Secondary | ICD-10-CM | POA: Diagnosis not present

## 2021-02-09 DIAGNOSIS — M9902 Segmental and somatic dysfunction of thoracic region: Secondary | ICD-10-CM | POA: Diagnosis not present

## 2021-02-09 DIAGNOSIS — M62830 Muscle spasm of back: Secondary | ICD-10-CM | POA: Diagnosis not present

## 2021-02-10 ENCOUNTER — Other Ambulatory Visit: Payer: Self-pay | Admitting: Family Medicine

## 2021-02-10 ENCOUNTER — Ambulatory Visit: Payer: Medicare PPO | Admitting: Family Medicine

## 2021-02-10 ENCOUNTER — Other Ambulatory Visit: Payer: Self-pay

## 2021-02-10 VITALS — BP 148/78 | HR 73 | Temp 97.5°F | Ht 61.0 in | Wt 141.6 lb

## 2021-02-10 DIAGNOSIS — E782 Mixed hyperlipidemia: Secondary | ICD-10-CM | POA: Diagnosis not present

## 2021-02-10 DIAGNOSIS — B070 Plantar wart: Secondary | ICD-10-CM

## 2021-02-10 DIAGNOSIS — E034 Atrophy of thyroid (acquired): Secondary | ICD-10-CM

## 2021-02-10 DIAGNOSIS — M85832 Other specified disorders of bone density and structure, left forearm: Secondary | ICD-10-CM | POA: Diagnosis not present

## 2021-02-10 DIAGNOSIS — I10 Essential (primary) hypertension: Secondary | ICD-10-CM

## 2021-02-10 DIAGNOSIS — E559 Vitamin D deficiency, unspecified: Secondary | ICD-10-CM

## 2021-02-10 DIAGNOSIS — Z23 Encounter for immunization: Secondary | ICD-10-CM

## 2021-02-10 DIAGNOSIS — I700 Atherosclerosis of aorta: Secondary | ICD-10-CM

## 2021-02-10 NOTE — Progress Notes (Signed)
Subjective: CC: HTN w/ HLD, Hypothyroidism PCP: [name], DO SWF:[facility] R Marissa Perez is a 75 y.o. female presenting to clinic today for:  1. HTN w/ HLD Patient is compliant with all of her medications.  No reports of chest pain, shortness of breath, edema  2. Hypothyroidism Compliant with Synthroid.  No tremor, heart palpitations.  3.  Plantar wart Patient has what she thinks is a plantar wart on the right foot.  She had this many years ago and has always had a little bit of a callus formation there but as of lately she sees a central punctum which suggest more of a wart.  She has been applying Compound W for the last couple of months but wants to know if there are any other things that she might do.   ROS: Per HPI  Allergies  Allergen Reactions   Codeine     Headache     Femring [Estradiol] Rash   Cephalexin Rash   Erythromycin Other (See Comments)    Gi  Upset     Penicillins Rash   Past Medical History:  Diagnosis Date   Acid reflux    Allergy    Arthritis    Diverticulosis    GERD (gastroesophageal reflux disease)    History of ETT 7/10   abnormal    History of stomach ulcers 2008   positive h. pylori   Hyperlipidemia    Hypertension    Hypothyroidism    IBS (irritable bowel syndrome)    Interstitial cystitis    Skin cancer    basal and squamous    Current Outpatient Medications:    atorvastatin (LIPITOR) 20 MG tablet, Take 1 tablet (20 mg total) by mouth daily., Disp: 90 tablet, Rfl: 3   BIOTIN PO, Take by mouth., Disp: , Rfl:    Calcium Citrate-Vitamin D (CALCIUM CITRATE + D3 MAXIMUM) 315-250 MG-UNIT TABS, Take 1 tablet by mouth 2 (two) times daily. (Patient taking differently: Take 1 tablet by mouth 2 (two) times daily. With magnesium), Disp: 120 tablet, Rfl:    Cholecalciferol (VITAMIN D PO), Take 5,000 Units by mouth daily., Disp: , Rfl:    clobetasol (TEMOVATE) 0.05 % external solution, Apply 1 application topically daily as needed. , Disp: ,  Rfl:    Cyanocobalamin (B-12) 1000 MCG SUBL, Place under the tongue., Disp: , Rfl:    diclofenac Sodium (VOLTAREN) 1 % GEL, Apply 4 g topically 4 (four) times daily., Disp: , Rfl:    ezetimibe (ZETIA) 10 MG tablet, Take 1 tablet (10 mg total) by mouth daily., Disp: 90 tablet, Rfl: 3   gabapentin (NEURONTIN) 100 MG capsule, TAKE 1 CAPSULE IN THE MORNING, 1 in the afternoon AND 2 CAPSULES AT BEDTIME, Disp: 360 capsule, Rfl: 3   Glucosamine-Chondroit-Vit C-Mn (GLUCOSAMINE CHONDR 1500 COMPLX PO), Take by mouth., Disp: , Rfl:    icosapent Ethyl (VASCEPA) 1 g capsule, TAKE (2) CAPSULES TWICE DAILY., Disp: 120 capsule, Rfl: 12   levothyroxine (SYNTHROID) 50 MCG tablet, TAKE 1/2 TAB ON MON, WED, FRI & 1 TAB ON TUES AND THURS, Disp: 90 tablet, Rfl: 2   losartan (COZAAR) 50 MG tablet, Take 1 tablet (50 mg total) by mouth daily., Disp: 90 tablet, Rfl: 3   meloxicam (MOBIC) 15 MG tablet, Take 1 tablet (15 mg total) by mouth daily. (Patient taking differently: Take 15 mg by mouth as needed.), Disp: 90 tablet, Rfl: 3   Multiple Vitamin (MULTI-VITAMIN PO), Take by mouth daily., Disp: , Rfl:  omeprazole (PRILOSEC) 20 MG capsule, Take 1 capsule (20 mg total) by mouth 2 (two) times daily before a meal., Disp: 180 capsule, Rfl: 1   Probiotic Product (PROBIOTIC DAILY PO), Take by mouth., Disp: , Rfl:  Social History   Socioeconomic History   Marital status: Married    Spouse name: [name]"   Number of children: 1   Years of education: 16   Highest education level: Bachelor's degree (e.g., BA, AB, BS)  Occupational History   Occupation: Retired    Fish farm manager: Engineer, water    Comment: Teacher  Tobacco Use   Smoking status: Never   Smokeless tobacco: Never  Vaping Use   Vaping Use: Never used  Substance and Sexual Activity   Alcohol use: Yes    Alcohol/week: 14.0 standard drinks    Types: 14 Glasses of wine per week    Comment: 2 per day   Drug use: No   Sexual activity: Never  Other Topics  Concern   Not on file  Social History Narrative   Lives home with her husband "Marissa Perez" Their son lives in [place]. One grandchild   Social Determinants of Radio broadcast assistant Strain: Low Risk    Difficulty of Paying Living Expenses: Not hard at all  Food Insecurity: No Food Insecurity   Worried About Charity fundraiser in the Last Year: Never true   Arboriculturist in the Last Year: Never true  Transportation Needs: No Transportation Needs   Lack of Transportation (Medical): No   Lack of Transportation (Non-Medical): No  Physical Activity: Insufficiently Active   Days of Exercise per Week: 7 days   Minutes of Exercise per Session: 20 min  Stress: No Stress Concern Present   Feeling of Stress : Only a little  Social Connections: Engineer, building services of Communication with Friends and Family: More than three times a week   Frequency of Social Gatherings with Friends and Family: More than three times a week   Attends Religious Services: More than 4 times per year   Active Member of [employer] or Organizations: Yes   Attends Music therapist: More than 4 times per year   Marital Status: Married  Human resources officer Violence: Not At Risk   Fear of Current or Ex-Partner: No   Emotionally Abused: No   Physically Abused: No   Sexually Abused: No   Family History  Problem Relation Age of Onset   Diabetes Father    Heart disease Father    Heart attack Father 62   Breast cancer Mother    Breast cancer Paternal Grandmother        Aunt also    Breast cancer Other        Family History   Breast cancer Maternal Aunt    Breast cancer Paternal Aunt    Colon cancer Neg Hx    Esophageal cancer Neg Hx    Rectal cancer Neg Hx    Stomach cancer Neg Hx     Objective: Office vital signs reviewed. BP (!) 148/78   Pulse 73   Temp (!) 97.5 F (36.4 C)   Ht 5' 1"  (1.549 m)   Wt 141 lb 9.6 oz (64.2 kg)   SpO2 97%   BMI 26.76 kg/m   Physical Examination:   General: Awake, alert, well nourished, No acute distress HEENT: sclera white, no exophthalmos or goiter.  Alopecia noted. Cardio: regular rate and rhythm, S1S2 heard, no murmurs appreciated Pulm:  clear to auscultation bilaterally, no wheezes, rhonchi or rales; normal work of breathing on room air Extremities: warm, well perfused, No edema, cyanosis or clubbing; +2 pulses bilaterally Skin: She has a hyperkeratotic lesion consistent with a plantar wart on the plantar medial aspect of her right foot near the first MTP.  Central punctum noted.  Nontender, nonerythematous  Recent Results (from the past 2160 hour(s))  CBC     Status: None   Collection Time: 02/04/21  8:28 AM  Result Value Ref Range   WBC 5.2 3.4 - 10.8 x10E3/uL   RBC 4.09 3.77 - 5.28 x10E6/uL   Hemoglobin 12.7 11.1 - 15.9 g/dL   Hematocrit 37.3 34.0 - 46.6 %   MCV 91 79 - 97 fL   MCH 31.1 26.6 - 33.0 pg   MCHC 34.0 31.5 - 35.7 g/dL   RDW 12.0 11.7 - 15.4 %   Platelets 292 150 - 450 [id number]  Basic metabolic panel     Status: None   Collection Time: 02/04/21  8:28 AM  Result Value Ref Range   Glucose 96 65 - 99 mg/dL   BUN 12 8 - 27 mg/dL   Creatinine, Ser 0.69 0.57 - 1.00 mg/dL   eGFR 90 >59 mL/min/1.73   BUN/Creatinine Ratio 17 12 - 28   Sodium 138 134 - 144 mmol/L   Potassium 4.5 3.5 - 5.2 mmol/L   Chloride 100 96 - 106 mmol/L   CO2 22 20 - 29 mmol/L   Calcium 9.5 8.7 - 10.3 mg/dL  T4, free     Status: None   Collection Time: 02/04/21  8:28 AM  Result Value Ref Range   Free T4 1.16 0.82 - 1.77 ng/dL  Lipid panel     Status: None   Collection Time: 02/04/21  8:28 AM  Result Value Ref Range   Cholesterol, Total 158 100 - 199 mg/dL   Triglycerides 101 0 - 149 mg/dL   HDL 69 >39 mg/dL   VLDL Cholesterol Cal 18 5 - 40 mg/dL   LDL Chol Calc (NIH) 71 0 - 99 mg/dL   Chol/HDL Ratio 2.3 0.0 - 4.4 ratio    Comment:                                   T. Chol/HDL Ratio                                             Men   Women                               1/2 Avg.Risk  3.4    3.3                                   Avg.Risk  5.0    4.4                                2X Avg.Risk  9.6    7.1  3X Avg.Risk 23.4   11.0   TSH     Status: None   Collection Time: 02/04/21  8:28 AM  Result Value Ref Range   TSH 3.000 0.450 - 4.500 uIU/mL    Assessment/ Plan: 75 y.o. female   Essential hypertension - Plan: CMP14+EGFR  Mixed hyperlipidemia - Plan: Lipid panel, CMP14+EGFR  Aortic atherosclerosis (HCC) - Plan: Lipid panel  Hypothyroidism due to acquired atrophy of thyroid - Plan: TSH, T4, free  Plantar wart of right foot  Vitamin D deficiency - Plan: VITAMIN D 25 Hydroxy (Vit-D Deficiency, Fractures)  Osteopenia of left forearm - Plan: VITAMIN D 25 Hydroxy (Vit-D Deficiency, Fractures)  Blood pressure well controlled for age.  Continue current regimen.    We reviewed her labs which showed normalization of her triglycerides.  Thyroid levels are appropriate.  No changes needed  We discussed options for the plantar wart including cryotherapy, referral to podiatry for debridement and/or duct tape method.  She would like to proceed with duct tape method and instructions have been provided  Future orders for Vit D placed as well as fasting labs for physical in 62m   No orders of the defined types were placed in this encounter.  No orders of the defined types were placed in this encounter.    AJanora Norlander DO [place]([phone]

## 2021-02-10 NOTE — Patient Instructions (Signed)
Warts  Warts are small growths on the skin. They are common and can occur on manyareas of the body. A person may have one wart or several warts. In many cases, warts do not require treatment. They usually go away on their own over a period of many months to a few years. If needed, warts that causeproblems or do not go away on their own can be treated. What are the causes? Warts are caused by a type of virus that is called human papillomavirus (HPV). This virus can spread from person to person through direct contact. Warts can also spread to other areas of the body when a person scratches a wart and then scratches another area of his or her body. What increases the risk? You are more likely to develop this condition if: You are 59-74 years old. You have a weakened body defense system (immune system). You are Caucasian. What are the signs or symptoms? The main symptom of this condition is small growths on the skin. Warts may: Be round or oval or have an irregular shape. Have a rough surface. Range in color from skin color to light yellow, brown, or gray. Generally be less than  inch (1.3 cm) in size. Go away and then come back again. Most warts are painless, but some can be painful if they are large or occur in an area of the body where pressure will be applied to them, such as the bottomof the foot. How is this diagnosed? A wart can usually be diagnosed based on its appearance. In some cases, a tissue sample may be removed (biopsy) to be looked at under a microscope. How is this treated? In many cases, warts do not need treatment. Sometimes treatment is desired. If treatment is needed or desired, options may include: Applying medicated solutions, creams, or patches to the wart. These may be over-the-counter or prescription medicines that make the skin soft so that layers will gradually shed away. In many cases, the medicine is applied one or two times per day and covered with a  bandage. Putting duct tape over the top of the wart (occlusion). You will leave the tape in place for as long as told by your health care provider and then replace it with a new strip of tape. This is done until the wart goes away. Freezing the wart with liquid nitrogen (cryotherapy). Burning the wart with: Laser treatment. An electrified probe (electrocautery). Injection of a medicine (Candida antigen) into the wart to help the body's immune system fight off the wart. Surgery to remove the wart. Follow these instructions at home: Medicines Apply over-the-counter and prescription medicines only as told by your health care provider. Do not apply over-the-counter wart medicines to your face or genitals unless your health care provider tells you to do that. Lifestyle Keep your immune system healthy. To do this: Eat a healthy, balanced diet. Get enough sleep. Do not use any products that contain nicotine or tobacco, such as cigarettes and e-cigarettes. If you need help quitting, ask your health care provider. General instructions  Wash your hands after you touch a wart. Do not scratch or pick at a wart. Avoid shaving hair that is over a wart. Keep all follow-up visits as told by your health care provider. This is important.  Contact a health care provider if: Your warts do not improve after treatment. You have redness, swelling, or pain at the site of a wart. You have bleeding from a wart that does not stop with  light pressure. You have diabetes and you develop a wart. Summary Warts are small growths on the skin. They are common and can occur on many areas of the body. In many cases, warts do not need treatment. Sometimes treatment is desired. If treatment is needed or desired, there are several treatment options. Apply over-the-counter and prescription medicines only as told by your health care provider. Wash your hands after you touch a wart. Keep all follow-up visits as told by your  health care provider. This is important. This information is not intended to replace advice given to you by your health care provider. Make sure you discuss any questions you have with your healthcare provider. Document Revised: 12/06/2017 Document Reviewed: 12/06/2017 Elsevier Patient Education  [place].

## 2021-02-23 DIAGNOSIS — M9901 Segmental and somatic dysfunction of cervical region: Secondary | ICD-10-CM | POA: Diagnosis not present

## 2021-02-23 DIAGNOSIS — M9903 Segmental and somatic dysfunction of lumbar region: Secondary | ICD-10-CM | POA: Diagnosis not present

## 2021-02-23 DIAGNOSIS — M9902 Segmental and somatic dysfunction of thoracic region: Secondary | ICD-10-CM | POA: Diagnosis not present

## 2021-02-23 DIAGNOSIS — M62830 Muscle spasm of back: Secondary | ICD-10-CM | POA: Diagnosis not present

## 2021-03-02 DIAGNOSIS — D2371 Other benign neoplasm of skin of right lower limb, including hip: Secondary | ICD-10-CM | POA: Diagnosis not present

## 2021-03-02 DIAGNOSIS — D2262 Melanocytic nevi of left upper limb, including shoulder: Secondary | ICD-10-CM | POA: Diagnosis not present

## 2021-03-02 DIAGNOSIS — D224 Melanocytic nevi of scalp and neck: Secondary | ICD-10-CM | POA: Diagnosis not present

## 2021-03-02 DIAGNOSIS — D2239 Melanocytic nevi of other parts of face: Secondary | ICD-10-CM | POA: Diagnosis not present

## 2021-03-02 DIAGNOSIS — D2271 Melanocytic nevi of right lower limb, including hip: Secondary | ICD-10-CM | POA: Diagnosis not present

## 2021-03-02 DIAGNOSIS — D2272 Melanocytic nevi of left lower limb, including hip: Secondary | ICD-10-CM | POA: Diagnosis not present

## 2021-03-02 DIAGNOSIS — D225 Melanocytic nevi of trunk: Secondary | ICD-10-CM | POA: Diagnosis not present

## 2021-03-02 DIAGNOSIS — D2261 Melanocytic nevi of right upper limb, including shoulder: Secondary | ICD-10-CM | POA: Diagnosis not present

## 2021-03-02 DIAGNOSIS — L821 Other seborrheic keratosis: Secondary | ICD-10-CM | POA: Diagnosis not present

## 2021-03-09 DIAGNOSIS — M9902 Segmental and somatic dysfunction of thoracic region: Secondary | ICD-10-CM | POA: Diagnosis not present

## 2021-03-09 DIAGNOSIS — M9901 Segmental and somatic dysfunction of cervical region: Secondary | ICD-10-CM | POA: Diagnosis not present

## 2021-03-09 DIAGNOSIS — M62830 Muscle spasm of back: Secondary | ICD-10-CM | POA: Diagnosis not present

## 2021-03-09 DIAGNOSIS — M9903 Segmental and somatic dysfunction of lumbar region: Secondary | ICD-10-CM | POA: Diagnosis not present

## 2021-03-13 ENCOUNTER — Ambulatory Visit: Payer: Medicare PPO | Admitting: Family Medicine

## 2021-03-13 ENCOUNTER — Encounter: Payer: Self-pay | Admitting: Family Medicine

## 2021-03-13 ENCOUNTER — Ambulatory Visit: Payer: Medicare PPO

## 2021-03-13 ENCOUNTER — Other Ambulatory Visit: Payer: Self-pay

## 2021-03-13 VITALS — BP 138/69 | HR 77 | Temp 97.1°F | Ht 61.0 in | Wt 141.8 lb

## 2021-03-13 DIAGNOSIS — H00012 Hordeolum externum right lower eyelid: Secondary | ICD-10-CM

## 2021-03-13 NOTE — Progress Notes (Signed)
Subjective:  Patient ID: Marissa Perez, female    DOB: Dec 20, 1945, 75 y.o.   MRN: [id number]  Patient Care Team: [name], DO as PCP - General (Family Medicine) Druscilla Brownie, MD as Consulting Physician (Dermatology) Inda Castle, MD (Inactive) as Consulting Physician (Gastroenterology) Katy Fitch, Darlina Guys, MD as Consulting Physician (Ophthalmology) Lavena Bullion, DO as Consulting Physician (Gastroenterology) Ralene Muskrat as Physician Assistant (Chiropractic Medicine)   Chief Complaint:  bump on lower eye lid (Right eye x 1 day)   HPI: Marissa Perez is a 75 y.o. female presenting on 03/13/2021 for bump on lower eye lid (Right eye x 1 day)   Eye Problem  The right eye is affected. This is a new problem. The current episode started yesterday. The problem has been unchanged. There was no injury mechanism. The pain is at a severity of 1/10. The pain is mild. There is No known exposure to pink eye. She Does not wear contacts. Associated symptoms include eye redness. Pertinent negatives include no blurred vision, eye discharge, double vision, fever, foreign body sensation, itching, nausea, photophobia, recent URI, vomiting or weakness. She has tried nothing for the symptoms. The treatment provided no relief.   Relevant past medical, surgical, family, and social history reviewed and updated as indicated.  Allergies and medications reviewed and updated. Data reviewed: Chart in Epic.   Past Medical History:  Diagnosis Date   Acid reflux    Allergy    Arthritis    Diverticulosis    GERD (gastroesophageal reflux disease)    History of ETT 7/10   abnormal    History of stomach ulcers 2008   positive h. pylori   Hyperlipidemia    Hypertension    Hypothyroidism    IBS (irritable bowel syndrome)    Interstitial cystitis    Skin cancer    basal and squamous    Past Surgical History:  Procedure Laterality Date   CESAREAN SECTION  1972   MOHS  SURGERY     TONSILLECTOMY  1951   TOTAL ABDOMINAL HYSTERECTOMY W/ BILATERAL SALPINGOOPHORECTOMY  1990   Dr. Tamala Julian / fibroids     Social History   Socioeconomic History   Marital status: Married    Spouse name: [name]"   Number of children: 1   Years of education: 16   Highest education level: Bachelor's degree (e.g., BA, AB, BS)  Occupational History   Occupation: Retired    Fish farm manager: Engineer, water    Comment: Teacher  Tobacco Use   Smoking status: Never   Smokeless tobacco: Never  Vaping Use   Vaping Use: Never used  Substance and Sexual Activity   Alcohol use: Yes    Alcohol/week: 14.0 standard drinks    Types: 14 Glasses of wine per week    Comment: 2 per day   Drug use: No   Sexual activity: Never  Other Topics Concern   Not on file  Social History Narrative   Lives home with her husband "[name]" Their son lives in [place]. One grandchild   Social Determinants of Radio broadcast assistant Strain: Low Risk    Difficulty of Paying Living Expenses: Not hard at all  Food Insecurity: No Food Insecurity   Worried About Charity fundraiser in the Last Year: Never true   Arboriculturist in the Last Year: Never true  Transportation Needs: No Transportation Needs   Lack of Transportation (Medical): No   Lack of  Transportation (Non-Medical): No  Physical Activity: Insufficiently Active   Days of Exercise per Week: 7 days   Minutes of Exercise per Session: 20 min  Stress: No Stress Concern Present   Feeling of Stress : Only a little  Social Connections: Engineer, building services of Communication with Friends and Family: More than three times a week   Frequency of Social Gatherings with Friends and Family: More than three times a week   Attends Religious Services: More than 4 times per year   Active Member of [employer] or Organizations: Yes   Attends Music therapist: More than 4 times per year   Marital Status: Married  Human resources officer  Violence: Not At Risk   Fear of Current or Ex-Partner: No   Emotionally Abused: No   Physically Abused: No   Sexually Abused: No    Outpatient Encounter Medications as of 03/13/2021  Medication Sig   atorvastatin (LIPITOR) 20 MG tablet Take 1 tablet (20 mg total) by mouth daily.   BIOTIN PO Take by mouth.   Calcium Citrate-Vitamin D (CALCIUM CITRATE + D3 MAXIMUM) 315-250 MG-UNIT TABS Take 1 tablet by mouth 2 (two) times daily. (Patient taking differently: Take 1 tablet by mouth 2 (two) times daily. With magnesium)   Cholecalciferol (VITAMIN D PO) Take 5,000 Units by mouth daily.   clobetasol (TEMOVATE) 0.05 % external solution Apply 1 application topically daily as needed.    Cyanocobalamin (B-12) 1000 MCG SUBL Place under the tongue.   diclofenac Sodium (VOLTAREN) 1 % GEL Apply 4 g topically 4 (four) times daily.   ezetimibe (ZETIA) 10 MG tablet Take 1 tablet (10 mg total) by mouth daily.   gabapentin (NEURONTIN) 100 MG capsule TAKE 1 CAPSULE IN THE MORNING, 1 in the afternoon AND 2 CAPSULES AT BEDTIME   Glucosamine-Chondroit-Vit C-Mn (GLUCOSAMINE CHONDR 1500 COMPLX PO) Take by mouth.   icosapent Ethyl (VASCEPA) 1 g capsule TAKE (2) CAPSULES TWICE DAILY.   levothyroxine (SYNTHROID) 50 MCG tablet TAKE 1/2 TAB ON MON, WED, FRI & 1 TAB ON TUES AND THURS   losartan (COZAAR) 50 MG tablet Take 1 tablet (50 mg total) by mouth daily.   meloxicam (MOBIC) 15 MG tablet Take 1 tablet (15 mg total) by mouth daily. (Patient taking differently: Take 15 mg by mouth as needed.)   Multiple Vitamin (MULTI-VITAMIN PO) Take by mouth daily.   omeprazole (PRILOSEC) 20 MG capsule TAKE 1 CAPSULE 2 TIMES A DAY BEFORE A MEAL   Probiotic Product (PROBIOTIC DAILY PO) Take by mouth.   No facility-administered encounter medications on file as of 03/13/2021.    Allergies  Allergen Reactions   Codeine     Headache     Femring [Estradiol] Rash   Cephalexin Rash   Erythromycin Other (See Comments)    Gi  Upset      Penicillins Rash    Review of Systems  Constitutional:  Negative for activity change, appetite change, chills, fatigue, fever and unexpected weight change.  HENT: Negative.    Eyes:  Positive for redness. Negative for blurred vision, double vision, photophobia, pain, discharge, itching and visual disturbance.  Respiratory:  Negative for cough, chest tightness and shortness of breath.   Cardiovascular:  Negative for chest pain, palpitations and leg swelling.  Gastrointestinal:  Negative for blood in stool, constipation, diarrhea, nausea and vomiting.  Endocrine: Negative.   Genitourinary:  Negative for dysuria, frequency and urgency.  Musculoskeletal:  Negative for arthralgias and myalgias.  Skin:  Positive for  color change. Negative for pallor, rash and wound.  Allergic/Immunologic: Negative.   Neurological:  Negative for dizziness, tremors, seizures, syncope, facial asymmetry, speech difficulty, weakness, light-headedness, numbness and headaches.  Hematological: Negative.   Psychiatric/Behavioral:  Negative for confusion, hallucinations, sleep disturbance and suicidal ideas.   All other systems reviewed and are negative.      Objective:  BP 138/69   Pulse 77   Temp (!) 97.1 F (36.2 C) (Temporal)   Ht _0  (1.549 m)   Wt 141 lb 12.8 oz (64.3 kg)   BMI 26.79 kg/m    Wt Readings from Last 3 Encounters:  03/13/21 141 lb 12.8 oz (64.3 kg)  02/10/21 141 lb 9.6 oz (64.2 kg)  01/28/21 138 lb (62.6 kg)    Physical Exam Vitals and nursing note reviewed.  Constitutional:      General: She is not in acute distress.    Appearance: Normal appearance. She is well-developed and well-groomed. She is not ill-appearing, toxic-appearing or diaphoretic.  HENT:     Head: Normocephalic and atraumatic.     Jaw: There is normal jaw occlusion.     Right Ear: Hearing normal.     Left Ear: Hearing normal.     Nose: Nose normal.     Mouth/Throat:     Lips: Pink.     Mouth: Mucous  membranes are moist.     Pharynx: Oropharynx is clear. Uvula midline.  Eyes:     General: Lids are normal. Lids are everted, no foreign bodies appreciated. Vision grossly intact. Gaze aligned appropriately. No allergic shiner, visual field deficit or scleral icterus.       Right eye: Hordeolum present. No foreign body or discharge.        Left eye: No foreign body, discharge or hordeolum.     Extraocular Movements: Extraocular movements intact.     Conjunctiva/sclera: Conjunctivae normal.     Pupils: Pupils are equal, round, and reactive to light.   Neck:     Thyroid: No thyroid mass, thyromegaly or thyroid tenderness.     Vascular: No carotid bruit or JVD.     Trachea: Trachea and phonation normal.  Cardiovascular:     Rate and Rhythm: Normal rate and regular rhythm.     Chest Wall: PMI is not displaced.     Pulses: Normal pulses.     Heart sounds: Normal heart sounds. No murmur heard.   No friction rub. No gallop.  Pulmonary:     Effort: Pulmonary effort is normal. No respiratory distress.     Breath sounds: Normal breath sounds. No wheezing.  Abdominal:     General: Bowel sounds are normal. There is no distension or abdominal bruit.     Palpations: Abdomen is soft. There is no hepatomegaly or splenomegaly.     Tenderness: There is no abdominal tenderness. There is no right CVA tenderness or left CVA tenderness.     Hernia: No hernia is present.  Musculoskeletal:        General: Normal range of motion.     Cervical back: Normal range of motion and neck supple.     Right lower leg: No edema.     Left lower leg: No edema.  Lymphadenopathy:     Cervical: No cervical adenopathy.  Skin:    General: Skin is warm and dry.     Capillary Refill: Capillary refill takes less than 2 seconds.     Coloration: Skin is not cyanotic, jaundiced or pale.  Findings: No rash.  Neurological:     General: No focal deficit present.     Mental Status: She is alert and oriented to person, place,  and time.     Cranial Nerves: Cranial nerves are intact.     Sensory: Sensation is intact.     Motor: Motor function is intact.     Coordination: Coordination is intact.     Gait: Gait is intact.     Deep Tendon Reflexes: Reflexes are normal and symmetric.  Psychiatric:        Attention and Perception: Attention and perception normal.        Mood and Affect: Mood and affect normal.        Speech: Speech normal.        Behavior: Behavior normal. Behavior is cooperative.        Thought Content: Thought content normal.        Cognition and Memory: Cognition and memory normal.        Judgment: Judgment normal.    Results for orders placed or performed in visit on 02/04/21  CBC  Result Value Ref Range   WBC 5.2 3.4 - 10.8 x10E3/uL   RBC 4.09 3.77 - 5.28 x10E6/uL   Hemoglobin 12.7 11.1 - 15.9 g/dL   Hematocrit 37.3 34.0 - 46.6 %   MCV 91 79 - 97 fL   MCH 31.1 26.6 - 33.0 pg   MCHC 34.0 31.5 - 35.7 g/dL   RDW 12.0 11.7 - 15.4 %   Platelets 292 150 - 450 [id number]  Basic metabolic panel  Result Value Ref Range   Glucose 96 65 - 99 mg/dL   BUN 12 8 - 27 mg/dL   Creatinine, Ser 0.69 0.57 - 1.00 mg/dL   eGFR 90 >59 mL/min/1.73   BUN/Creatinine Ratio 17 12 - 28   Sodium 138 134 - 144 mmol/L   Potassium 4.5 3.5 - 5.2 mmol/L   Chloride 100 96 - 106 mmol/L   CO2 22 20 - 29 mmol/L   Calcium 9.5 8.7 - 10.3 mg/dL  T4, free  Result Value Ref Range   Free T4 1.16 0.82 - 1.77 ng/dL  Lipid panel  Result Value Ref Range   Cholesterol, Total 158 100 - 199 mg/dL   Triglycerides 101 0 - 149 mg/dL   HDL 69 >39 mg/dL   VLDL Cholesterol Cal 18 5 - 40 mg/dL   LDL Chol Calc (NIH) 71 0 - 99 mg/dL   Chol/HDL Ratio 2.3 0.0 - 4.4 ratio  TSH  Result Value Ref Range   TSH 3.000 0.450 - 4.500 uIU/mL       Pertinent labs & imaging results that were available during my care of the patient were reviewed by me and considered in my medical decision making.  Assessment & Plan:  Ailyne was seen today  for bump on lower eye lid.  Diagnoses and all orders for this visit:  Hordeolum externum of right lower eyelid No indications of cellulitis. Symptomatic care discussed in detail. Warm compresses several times per day. Pt aware to report any new, worsening, or persistent symptoms.     Continue all other maintenance medications.  Follow up plan: Return if symptoms worsen or fail to improve.   Continue healthy lifestyle choices, including diet (rich in fruits, vegetables, and lean proteins, and low in salt and simple carbohydrates) and exercise (at least 30 minutes of moderate physical activity daily).  Educational handout given for stye  The above assessment and  management plan was discussed with the patient. The patient verbalized understanding of and has agreed to the management plan. Patient is aware to call the clinic if they develop any new symptoms or if symptoms persist or worsen. Patient is aware when to return to the clinic for a follow-up visit. Patient educated on when it is appropriate to go to the emergency department.   Monia Pouch, FNP-C [facility] [phone]

## 2021-03-19 DIAGNOSIS — M9901 Segmental and somatic dysfunction of cervical region: Secondary | ICD-10-CM | POA: Diagnosis not present

## 2021-03-19 DIAGNOSIS — M62830 Muscle spasm of back: Secondary | ICD-10-CM | POA: Diagnosis not present

## 2021-03-19 DIAGNOSIS — M9902 Segmental and somatic dysfunction of thoracic region: Secondary | ICD-10-CM | POA: Diagnosis not present

## 2021-03-19 DIAGNOSIS — M9903 Segmental and somatic dysfunction of lumbar region: Secondary | ICD-10-CM | POA: Diagnosis not present

## 2021-03-30 ENCOUNTER — Ambulatory Visit: Payer: Medicare PPO

## 2021-04-02 DIAGNOSIS — M62830 Muscle spasm of back: Secondary | ICD-10-CM | POA: Diagnosis not present

## 2021-04-02 DIAGNOSIS — M9901 Segmental and somatic dysfunction of cervical region: Secondary | ICD-10-CM | POA: Diagnosis not present

## 2021-04-02 DIAGNOSIS — M9902 Segmental and somatic dysfunction of thoracic region: Secondary | ICD-10-CM | POA: Diagnosis not present

## 2021-04-02 DIAGNOSIS — M9903 Segmental and somatic dysfunction of lumbar region: Secondary | ICD-10-CM | POA: Diagnosis not present

## 2021-04-08 ENCOUNTER — Encounter: Payer: Self-pay | Admitting: Nurse Practitioner

## 2021-04-08 ENCOUNTER — Ambulatory Visit: Payer: Medicare PPO | Admitting: Nurse Practitioner

## 2021-04-08 ENCOUNTER — Other Ambulatory Visit: Payer: Self-pay

## 2021-04-08 VITALS — BP 141/71 | HR 71 | Temp 97.5°F | Resp 20 | Ht 61.0 in | Wt 142.0 lb

## 2021-04-08 DIAGNOSIS — W57XXXA Bitten or stung by nonvenomous insect and other nonvenomous arthropods, initial encounter: Secondary | ICD-10-CM

## 2021-04-08 DIAGNOSIS — S70362A Insect bite (nonvenomous), left thigh, initial encounter: Secondary | ICD-10-CM | POA: Diagnosis not present

## 2021-04-08 MED ORDER — SULFAMETHOXAZOLE-TRIMETHOPRIM 800-160 MG PO TABS: 1.0000 | ORAL_TABLET | Freq: Two times a day (BID) | ORAL | 0 refills | Status: DC

## 2021-04-08 NOTE — Progress Notes (Signed)
   Subjective:    Patient ID: Marissa Perez, female    DOB: [birth year], 75 y.o.   MRN: [id number]   Chief Complaint: ? bite to inside of left thigh   HPI Patient come sin with a bite to her left thigh. Has turned into a vesicular lesion. Sore to touch.    Review of Systems  Constitutional:  Negative for diaphoresis.  Eyes:  Negative for pain.  Respiratory:  Negative for shortness of breath.   Cardiovascular:  Negative for chest pain, palpitations and leg swelling.  Gastrointestinal:  Negative for abdominal pain.  Endocrine: Negative for polydipsia.  Skin:  Negative for rash.  Neurological:  Negative for dizziness, weakness and headaches.  Hematological:  Does not bruise/bleed easily.  All other systems reviewed and are negative.     Objective:   Physical Exam Vitals and nursing note reviewed.  Constitutional:      Appearance: Normal appearance.  Cardiovascular:     Rate and Rhythm: Normal rate and regular rhythm.     Heart sounds: Normal heart sounds.  Pulmonary:     Effort: Pulmonary effort is normal.     Breath sounds: Normal breath sounds.  Skin:    General: Skin is warm.     Findings: Lesion (vesicular lesion left inner lower thigh) present.  Neurological:     General: No focal deficit present.     Mental Status: She is alert and oriented to person, place, and time.  Psychiatric:        Mood and Affect: Mood normal.        Behavior: Behavior normal.    BP (!) 141/71   Pulse 71   Temp (!) 97.5 F (36.4 C) (Temporal)   Resp 20   Ht '5\' 1"'$  (1.549 m)   Wt 142 lb (64.4 kg)   SpO2 98%   BMI 26.83 kg/m   Betadine to area- blister popped with 25 g needle- bandaid applied     Assessment & Plan:  [name] in today with chief complaint of ? bite to inside of left thigh   1. Insect bite of left thigh, initial encounter Kee area clean and dry Do not pick or scratch area Rto prn Meds ordered this encounter  Medications    sulfamethoxazole-trimethoprim (BACTRIM DS) 800-160 MG tablet    Sig: Take 1 tablet by mouth 2 (two) times daily.    Dispense:  20 tablet    Refill:  0    Order Specific Question:   Supervising Provider    Answer:   Caryl Pina A [id number]       The above assessment and management plan was discussed with the patient. The patient verbalized understanding of and has agreed to the management plan. Patient is aware to call the clinic if symptoms persist or worsen. Patient is aware when to return to the clinic for a follow-up visit. Patient educated on when it is appropriate to go to the emergency department.   Mary-Margaret Hassell Done, FNP

## 2021-04-08 NOTE — Patient Instructions (Signed)
Insect Bite, Adult An insect bite can make your skin red, itchy, and swollen. Some insects can spread disease to people with a bite. However, most insect bites do not lead to disease, and most are not serious. What are the causes? Insects may bite for many reasons, including: Hunger. To defend themselves. Insects that bite include: Spiders. Mosquitoes. Ticks. Fleas. Ants. Flies. Kissing bugs. Chiggers. What are the signs or symptoms? Symptoms of this condition include: Itching or pain in the bite area. Redness and swelling in the bite area. An open wound (skin ulcer). Symptoms often last for 2-4 days. In rare cases, a person may have a very bad allergic reaction (anaphylactic reaction) to a bite. Symptoms of an anaphylactic reaction may include: Feeling warm in the face (flushed). Your face may turn red. Itchy, red, swollen areas of skin (hives). Swelling of the: Eyes. Lips. Face. Mouth. Tongue. Throat. Trouble with any of these: Breathing. Talking. Swallowing. Loud breathing (wheezing). Feeling dizzy or light-headed. Passing out (fainting). Pain or cramps in your belly. Throwing up (vomiting). Watery poop (diarrhea). How is this treated? Treatment is usually not needed. Symptoms often go away on their own. When treatment is needed, it may involve: Putting a cream or lotion on the bite area. This helps with itching. Taking an antibiotic medicine. This treatment is needed if the bite area gets infected. Getting a tetanus shot, if you are not up to date on this vaccine. Putting ice on the affected area. Using medicines called antihistamines. This treatment may be needed if you have itching or an allergic reaction to the insect bite. Giving yourself a shot of medicine (epinephrine) using an auto-injector "pen" if you have an anaphylactic reaction to a bite. Your doctor will teach you how to use this pen. Follow these instructions at home: Bite area care  Do not  scratch the bite area. Keep the bite area clean and dry. Wash the bite area every day with soap and water as told by your doctor. Check the bite area every day for signs of infection. Check for: Redness, swelling, or pain. Fluid or blood. Warmth. Pus or a bad smell. Managing pain, itching, and swelling  You may put any of these on the bite area as told by your doctor: A paste made of baking soda and water. Cortisone cream. Calamine lotion. If told, put ice on the bite area. Put ice in a plastic bag. Place a towel between your skin and the bag. Leave the ice on for 20 minutes, 2-3 times a day. General instructions Apply or take over-the-counter and prescription medicines only as told by your doctor. If you were prescribed an antibiotic medicine, take or apply it as told by your doctor. Do not stop using the antibiotic even if your condition improves. Keep all follow-up visits as told by your doctor. This is important. How is this prevented? To help you have a lower risk of insect bites: When you are outside, wear clothing that covers your arms and legs. Use insect repellent. The best insect repellents contain one of these: DEET. Picaridin. Oil of lemon eucalyptus (OLE). [id number]. Consider spraying your clothing with a pesticide called permethrin. Permethrin helps prevent insect bites. It works for several weeks and for up to 5-6 clothing washes. Do not apply permethrin directly to the skin. If your home windows do not have screens, think about putting some in. If you will be sleeping in an area where there are mosquitoes, consider covering your sleeping area with a mosquito  net. Contact a doctor if: You have redness, swelling, or pain in the bite area. You have fluid or blood coming from the bite area. The bite area feels warm to the touch. You have pus or a bad smell coming from the bite area. You have a fever. Get help right away if: You have joint pain. You have a rash. You  feel more tired or sleepy than you normally do. You have neck pain. You have a headache. You feel weaker than you normally do. You have signs of an anaphylactic reaction. Signs may include: Feeling warm in the face. Itchy, red, swollen areas of skin. Swelling of your: Eyes. Lips. Face. Mouth. Tongue. Throat. Trouble with any of these: Breathing. Talking. Swallowing. Loud breathing. Feeling dizzy or light-headed. Passing out. Pain or cramps in your belly. Throwing up. Watery poop. These symptoms may be an emergency. Do not wait to see if the symptoms will go away. Do this right away: Use your auto-injector pen as you have been told. Get medical help. Call your local emergency services (911 in the [place] Do not drive yourself to the hospital. Summary An insect bite can make your skin red, itchy, and swollen. Treatment is usually not needed. Symptoms often go away on their own. Do not scratch the bite area. Keep it clean and dry. Ice can help with pain and itching from the bite. This information is not intended to replace advice given to you by your health care provider. Make sure you discuss any questions you have with your health care provider. Document Revised: 06/10/2020 Document Reviewed: 01/27/2018 Elsevier Patient Education  2022 [employer].

## 2021-04-16 DIAGNOSIS — M62830 Muscle spasm of back: Secondary | ICD-10-CM | POA: Diagnosis not present

## 2021-04-16 DIAGNOSIS — M9901 Segmental and somatic dysfunction of cervical region: Secondary | ICD-10-CM | POA: Diagnosis not present

## 2021-04-16 DIAGNOSIS — M9902 Segmental and somatic dysfunction of thoracic region: Secondary | ICD-10-CM | POA: Diagnosis not present

## 2021-04-16 DIAGNOSIS — M9903 Segmental and somatic dysfunction of lumbar region: Secondary | ICD-10-CM | POA: Diagnosis not present

## 2021-04-29 ENCOUNTER — Encounter: Payer: Self-pay | Admitting: Family Medicine

## 2021-05-01 ENCOUNTER — Other Ambulatory Visit: Payer: Self-pay

## 2021-05-01 ENCOUNTER — Ambulatory Visit
Admission: RE | Admit: 2021-05-01 | Discharge: 2021-05-01 | Disposition: A | Discharge status: Home / Self Care | Payer: Medicare PPO | Source: Ambulatory Visit | Attending: Family Medicine | Admitting: Family Medicine

## 2021-05-01 DIAGNOSIS — Z1231 Encounter for screening mammogram for malignant neoplasm of breast: Secondary | ICD-10-CM | POA: Diagnosis not present

## 2021-05-06 ENCOUNTER — Other Ambulatory Visit: Payer: Self-pay | Admitting: Family Medicine

## 2021-07-01 ENCOUNTER — Encounter: Payer: Self-pay | Admitting: Family Medicine

## 2021-07-06 ENCOUNTER — Other Ambulatory Visit: Payer: Self-pay | Admitting: Nurse Practitioner

## 2021-07-06 ENCOUNTER — Encounter: Payer: Self-pay | Admitting: Nurse Practitioner

## 2021-07-06 ENCOUNTER — Ambulatory Visit (INDEPENDENT_AMBULATORY_CARE_PROVIDER_SITE_OTHER): Payer: Medicare PPO | Admitting: Nurse Practitioner

## 2021-07-06 DIAGNOSIS — J0110 Acute frontal sinusitis, unspecified: Secondary | ICD-10-CM | POA: Diagnosis not present

## 2021-07-06 MED ORDER — PREDNISONE 10 MG (21) PO TBPK: ORAL_TABLET | ORAL | 0 refills | Status: DC

## 2021-07-06 MED ORDER — BENZONATATE 100 MG PO CAPS: 100.0000 mg | ORAL_CAPSULE | Freq: Three times a day (TID) | ORAL | 0 refills | Status: DC | PRN

## 2021-07-06 MED ORDER — DOXYCYCLINE HYCLATE 100 MG PO TABS: 100.0000 mg | ORAL_TABLET | Freq: Two times a day (BID) | ORAL | 0 refills | Status: DC

## 2021-07-06 NOTE — Assessment & Plan Note (Signed)
Take meds as prescribed - Use a cool mist humidifier  -Use saline nose sprays frequently -Force fluids  -For fever or aches or pains- take Tylenol or ibuprofen. -Benzonate for cough  -Doxycycline 100 mg tablet by mouth  Twice daily -If symptoms do not improve, she may need to be COVID tested to rule this out Follow up with worsening unresolved symptoms  Education provided

## 2021-07-06 NOTE — Progress Notes (Signed)
   Virtual Visit  Note Due to COVID-19 pandemic this visit was conducted virtually. This visit type was conducted due to national recommendations for restrictions regarding the COVID-19 Pandemic (e.g. social distancing, sheltering in place) in an effort to limit this patient's exposure and mitigate transmission in our community. All issues noted in this document were discussed and addressed.  A physical exam was not performed with this format.  I connected with Marissa Perez on 07/06/21 at 11:40 am  by telephone and verified that I am speaking with the correct person using two identifiers. Marissa Perez is currently located at home with spouse during visit. The provider, Ivy Lynn, NP is located in their office at time of visit.  I discussed the limitations, risks, security and privacy concerns of performing an evaluation and management service by telephone and the availability of in person appointments. I also discussed with the patient that there may be a patient responsible charge related to this service. The patient expressed understanding and agreed to proceed.   History and Present Illness:  Sinusitis This is a recurrent problem. The current episode started 1 to 4 weeks ago. There has been no fever. Associated symptoms include congestion and a sore throat. Pertinent negatives include no chills, coughing or ear pain. Past treatments include antibiotics. The treatment provided moderate relief.     Review of Systems  Constitutional:  Negative for chills and fever.  HENT:  Positive for congestion, sinus pain and sore throat. Negative for ear pain.   Respiratory:  Negative for cough.   Genitourinary: Negative.   Musculoskeletal: Negative.   All other systems reviewed and are negative.   Observations/Objective: Tele visit patient not in distress  Assessment and Plan: Subacute frontal sinusitis Take meds as prescribed - Use a cool mist humidifier  -Use saline nose sprays  frequently -Force fluids  -For fever or aches or pains- take Tylenol or ibuprofen. -Benzonate for cough  -Doxycycline 100 mg tablet by mouth  Twice daily -If symptoms do not improve, she may need to be COVID tested to rule this out Follow up with worsening unresolved symptoms  Education provided    Follow Up Instructions: Follow up with unresolved symptoms    I discussed the assessment and treatment plan with the patient. The patient was provided an opportunity to ask questions and all were answered. The patient agreed with the plan and demonstrated an understanding of the instructions.   The patient was advised to call back or seek an in-person evaluation if the symptoms worsen or if the condition fails to improve as anticipated.  The above assessment and management plan was discussed with the patient. The patient verbalized understanding of and has agreed to the management plan. Patient is aware to call the clinic if symptoms persist or worsen. Patient is aware when to return to the clinic for a follow-up visit. Patient educated on when it is appropriate to go to the emergency department.   Time call ended:  11:50 pm   I provided 10 minutes of  non face-to-face time during this encounter.    Ivy Lynn, NP

## 2021-07-19 ENCOUNTER — Encounter: Payer: Self-pay | Admitting: Nurse Practitioner

## 2021-07-20 ENCOUNTER — Other Ambulatory Visit: Payer: Self-pay | Admitting: Family Medicine

## 2021-07-20 MED ORDER — SULFAMETHOXAZOLE-TRIMETHOPRIM 800-160 MG PO TABS: 1.0000 | ORAL_TABLET | Freq: Two times a day (BID) | ORAL | 0 refills | Status: DC

## 2021-07-20 NOTE — Telephone Encounter (Signed)
I sent in a second round of antibiotic. It should knock out what is left of the infection. This time I used a sulfa antibiotic for a complementary approach to the previous, doxycycline.

## 2021-08-05 DIAGNOSIS — M9903 Segmental and somatic dysfunction of lumbar region: Secondary | ICD-10-CM | POA: Diagnosis not present

## 2021-08-05 DIAGNOSIS — M9901 Segmental and somatic dysfunction of cervical region: Secondary | ICD-10-CM | POA: Diagnosis not present

## 2021-08-05 DIAGNOSIS — M62830 Muscle spasm of back: Secondary | ICD-10-CM | POA: Diagnosis not present

## 2021-08-05 DIAGNOSIS — M9902 Segmental and somatic dysfunction of thoracic region: Secondary | ICD-10-CM | POA: Diagnosis not present

## 2021-08-07 ENCOUNTER — Encounter: Payer: Self-pay | Admitting: Family Medicine

## 2021-08-11 DIAGNOSIS — H02831 Dermatochalasis of right upper eyelid: Secondary | ICD-10-CM | POA: Diagnosis not present

## 2021-08-11 DIAGNOSIS — H02834 Dermatochalasis of left upper eyelid: Secondary | ICD-10-CM | POA: Diagnosis not present

## 2021-08-11 DIAGNOSIS — H25813 Combined forms of age-related cataract, bilateral: Secondary | ICD-10-CM | POA: Diagnosis not present

## 2021-08-11 DIAGNOSIS — D3131 Benign neoplasm of right choroid: Secondary | ICD-10-CM | POA: Diagnosis not present

## 2021-08-11 DIAGNOSIS — H04123 Dry eye syndrome of bilateral lacrimal glands: Secondary | ICD-10-CM | POA: Diagnosis not present

## 2021-08-12 ENCOUNTER — Other Ambulatory Visit: Payer: Medicare PPO

## 2021-08-12 DIAGNOSIS — M85832 Other specified disorders of bone density and structure, left forearm: Secondary | ICD-10-CM | POA: Diagnosis not present

## 2021-08-12 DIAGNOSIS — I700 Atherosclerosis of aorta: Secondary | ICD-10-CM

## 2021-08-12 DIAGNOSIS — E034 Atrophy of thyroid (acquired): Secondary | ICD-10-CM | POA: Diagnosis not present

## 2021-08-12 DIAGNOSIS — I10 Essential (primary) hypertension: Secondary | ICD-10-CM

## 2021-08-12 DIAGNOSIS — E782 Mixed hyperlipidemia: Secondary | ICD-10-CM | POA: Diagnosis not present

## 2021-08-12 DIAGNOSIS — E559 Vitamin D deficiency, unspecified: Secondary | ICD-10-CM | POA: Diagnosis not present

## 2021-08-13 LAB — CMP14+EGFR
ALT: 40 IU/L — ABNORMAL HIGH (ref 0–32)
AST: 35 IU/L (ref 0–40)
Albumin/Globulin Ratio: 2.1 (ref 1.2–2.2)
Albumin: 4.8 g/dL — ABNORMAL HIGH (ref 3.7–4.7)
Alkaline Phosphatase: 84 IU/L (ref 44–121)
BUN/Creatinine Ratio: 16 (ref 12–28)
BUN: 10 mg/dL (ref 8–27)
Bilirubin Total: 0.3 mg/dL (ref 0.0–1.2)
CO2: 24 mmol/L (ref 20–29)
Calcium: 9.4 mg/dL (ref 8.7–10.3)
Chloride: 97 mmol/L (ref 96–106)
Creatinine, Ser: 0.64 mg/dL (ref 0.57–1.00)
Globulin, Total: 2.3 g/dL (ref 1.5–4.5)
Glucose: 99 mg/dL (ref 70–99)
Potassium: 4.5 mmol/L (ref 3.5–5.2)
Sodium: 137 mmol/L (ref 134–144)
Total Protein: 7.1 g/dL (ref 6.0–8.5)
eGFR: 92 mL/min/{1.73_m2} (ref >59)

## 2021-08-13 LAB — T4, FREE: Free T4: 1.16 ng/dL (ref 0.82–1.77)

## 2021-08-13 LAB — VITAMIN D 25 HYDROXY (VIT D DEFICIENCY, FRACTURES): Vit D, 25-Hydroxy: 38 ng/mL (ref 30.0–100.0)

## 2021-08-13 LAB — LIPID PANEL
Chol/HDL Ratio: 2.3 ratio (ref 0.0–4.4)
Cholesterol, Total: 161 mg/dL (ref 100–199)
HDL: 69 mg/dL (ref >39)
LDL Chol Calc (NIH): 75 mg/dL (ref 0–99)
Triglycerides: 94 mg/dL (ref 0–149)
VLDL Cholesterol Cal: 17 mg/dL (ref 5–40)

## 2021-08-13 LAB — TSH: TSH: 4.11 u[IU]/mL (ref 0.450–4.500)

## 2021-08-14 ENCOUNTER — Encounter: Payer: Self-pay | Admitting: Family Medicine

## 2021-08-14 ENCOUNTER — Ambulatory Visit: Payer: Medicare PPO | Admitting: Family Medicine

## 2021-08-14 VITALS — BP 137/73 | HR 87 | Temp 97.4°F | Ht 61.0 in | Wt 142.0 lb

## 2021-08-14 DIAGNOSIS — E559 Vitamin D deficiency, unspecified: Secondary | ICD-10-CM | POA: Diagnosis not present

## 2021-08-14 DIAGNOSIS — E034 Atrophy of thyroid (acquired): Secondary | ICD-10-CM | POA: Diagnosis not present

## 2021-08-14 DIAGNOSIS — I10 Essential (primary) hypertension: Secondary | ICD-10-CM

## 2021-08-14 DIAGNOSIS — R7989 Other specified abnormal findings of blood chemistry: Secondary | ICD-10-CM | POA: Diagnosis not present

## 2021-08-14 DIAGNOSIS — I700 Atherosclerosis of aorta: Secondary | ICD-10-CM

## 2021-08-14 DIAGNOSIS — Z0001 Encounter for general adult medical examination with abnormal findings: Secondary | ICD-10-CM | POA: Diagnosis not present

## 2021-08-14 DIAGNOSIS — K219 Gastro-esophageal reflux disease without esophagitis: Secondary | ICD-10-CM | POA: Diagnosis not present

## 2021-08-14 DIAGNOSIS — L308 Other specified dermatitis: Secondary | ICD-10-CM

## 2021-08-14 DIAGNOSIS — E782 Mixed hyperlipidemia: Secondary | ICD-10-CM

## 2021-08-14 DIAGNOSIS — Z0000 Encounter for general adult medical examination without abnormal findings: Secondary | ICD-10-CM

## 2021-08-14 MED ORDER — OMEPRAZOLE 20 MG PO CPDR: DELAYED_RELEASE_CAPSULE | ORAL | 3 refills | Status: DC

## 2021-08-14 MED ORDER — LOSARTAN POTASSIUM 50 MG PO TABS: 50.0000 mg | ORAL_TABLET | Freq: Every day | ORAL | 3 refills | Status: DC

## 2021-08-14 MED ORDER — EZETIMIBE 10 MG PO TABS: 10.0000 mg | ORAL_TABLET | Freq: Every day | ORAL | 3 refills | Status: DC

## 2021-08-14 MED ORDER — GABAPENTIN 100 MG PO CAPS: ORAL_CAPSULE | ORAL | 3 refills | Status: DC

## 2021-08-14 MED ORDER — ICOSAPENT ETHYL 1 G PO CAPS: ORAL_CAPSULE | ORAL | 3 refills | Status: DC

## 2021-08-14 MED ORDER — DESONIDE 0.05 % EX CREA: TOPICAL_CREAM | Freq: Two times a day (BID) | CUTANEOUS | 0 refills | Status: DC

## 2021-08-14 MED ORDER — ATORVASTATIN CALCIUM 20 MG PO TABS: 20.0000 mg | ORAL_TABLET | Freq: Every day | ORAL | 3 refills | Status: DC

## 2021-08-14 MED ORDER — LEVOTHYROXINE SODIUM 50 MCG PO TABS: ORAL_TABLET | ORAL | 3 refills | Status: DC

## 2021-08-14 MED ORDER — FAMOTIDINE 20 MG PO TABS: 20.0000 mg | ORAL_TABLET | Freq: Every day | ORAL | 0 refills | Status: DC

## 2021-08-14 NOTE — Addendum Note (Signed)
Addended by: Antonietta Barcelona D on: 08/14/2021 04:02 PM   Modules accepted: Orders

## 2021-08-14 NOTE — Progress Notes (Signed)
Refills failed. resent 

## 2021-08-14 NOTE — Progress Notes (Signed)
Marissa Perez is a 76 y.o. female presents to office today for annual physical exam examination.    Concerns today include: 1.  GERD Patient reports GERD has not been well controlled since the holidays.  This is intermittent but sometimes she has acid reflux it so bad it wakes her up from sleep.  She is compliant with omeprazole 40 mg daily.  She has been taking Tums in between to alleviate symptoms.  No nausea, vomiting or GI bleeding reported  2.  Rash Patient reports a rash on the right buttock.  It started off as a dry spot but gradually got very red.  She had an old bottle of desonide and started utilizing it.  This seems to be helping but she wanted to make sure it looked okay.  Occupation: reitired, Marital status: married, Substance use: none Diet: fair, Exercise: no structured reported Last eye exam: UTD Last dental exam: UTD Last colonoscopy: UTD Last mammogram: UTD Last pap smear: na Refills needed today: all Immunizations needed: Immunization History  Administered Date(s) Administered   Fluad Quad(high Dose 65+) 05/04/2019, 05/28/2020, 05/06/2021   Influenza Split 05/21/2013   Influenza, High Dose Seasonal PF 05/10/2017, 05/08/2018   Influenza, Quadrivalent, Recombinant, Inj, Pf 05/06/2016   Influenza,inj,Quad PF,6+ Mos 05/02/2015   Influenza,inj,quad, With Preservative 05/10/2014, 05/02/2017   Moderna Covid-19 Vaccine Bivalent Booster 66yrs & up 06/30/2021   Moderna Sars-Covid-2 Vaccination 03/03/2021   PFIZER(Purple Top)SARS-COV-2 Vaccination 08/22/2019, 09/10/2019, 05/28/2020, 05/06/2021   Pneumococcal Conjugate-13 08/29/2013   Pneumococcal Polysaccharide-23 01/31/2011   Td 02/10/2021   Tdap 02/03/2011   Zoster Recombinat (Shingrix) 01/03/2017, 04/11/2017     Past Medical History:  Diagnosis Date   Acid reflux    Allergy    Arthritis    Diverticulosis    GERD (gastroesophageal reflux disease)    History of ETT 7/10   abnormal    History of stomach  ulcers 2008   positive h. pylori   Hyperlipidemia    Hypertension    Hypothyroidism    IBS (irritable bowel syndrome)    Interstitial cystitis    Skin cancer    basal and squamous   Social History   Socioeconomic History   Marital status: Married    Spouse name: [name]"   Number of children: 1   Years of education: 16   Highest education level: Bachelor's degree (e.g., BA, AB, BS)  Occupational History   Occupation: Retired    Fish farm manager: Engineer, water    Comment: Teacher  Tobacco Use   Smoking status: Never   Smokeless tobacco: Never  Vaping Use   Vaping Use: Never used  Substance and Sexual Activity   Alcohol use: Yes    Alcohol/week: 14.0 standard drinks    Types: 14 Glasses of wine per week    Comment: 2 per day   Drug use: No   Sexual activity: Never  Other Topics Concern   Not on file  Social History Narrative   Lives home with her husband "[name]" Their son lives in [place]. One grandchild   Social Determinants of Radio broadcast assistant Strain: Low Risk    Difficulty of Paying Living Expenses: Not hard at all  Food Insecurity: No Food Insecurity   Worried About Charity fundraiser in the Last Year: Never true   Arboriculturist in the Last Year: Never true  Transportation Needs: No Transportation Needs   Lack of Transportation (Medical): No   Lack of Transportation (Non-Medical): No  Physical Activity: Insufficiently Active   Days of Exercise per Week: 7 days   Minutes of Exercise per Session: 20 min  Stress: No Stress Concern Present   Feeling of Stress : Only a little  Social Connections: Engineer, building services of Communication with Friends and Family: More than three times a week   Frequency of Social Gatherings with Friends and Family: More than three times a week   Attends Religious Services: More than 4 times per year   Active Member of [employer] or Organizations: Yes   Attends Music therapist: More than 4 times  per year   Marital Status: Married  Human resources officer Violence: Not At Risk   Fear of Current or Ex-Partner: No   Emotionally Abused: No   Physically Abused: No   Sexually Abused: No   Past Surgical History:  Procedure Laterality Date   CESAREAN SECTION  1972   MOHS SURGERY     TONSILLECTOMY  1951   TOTAL ABDOMINAL HYSTERECTOMY W/ BILATERAL SALPINGOOPHORECTOMY  1990   Dr. Tamala Julian / fibroids    Family History  Problem Relation Age of Onset   Diabetes Father    Heart disease Father    Heart attack Father 63   Breast cancer Mother    Breast cancer Paternal Grandmother        Aunt also    Breast cancer Other        Family History   Breast cancer Maternal Aunt    Breast cancer Paternal Aunt    Colon cancer Neg Hx    Esophageal cancer Neg Hx    Rectal cancer Neg Hx    Stomach cancer Neg Hx     Current Outpatient Medications:    atorvastatin (LIPITOR) 20 MG tablet, Take 1 tablet (20 mg total) by mouth daily., Disp: 90 tablet, Rfl: 3   BIOTIN PO, Take by mouth., Disp: , Rfl:    Calcium Citrate-Vitamin D (CALCIUM CITRATE + D3 MAXIMUM) 315-250 MG-UNIT TABS, Take 1 tablet by mouth 2 (two) times daily. (Patient taking differently: Take 1 tablet by mouth 2 (two) times daily. With magnesium), Disp: 120 tablet, Rfl:    Cholecalciferol (VITAMIN D PO), Take 5,000 Units by mouth daily., Disp: , Rfl:    clobetasol (TEMOVATE) 0.05 % external solution, Apply 1 application topically daily as needed. , Disp: , Rfl:    Cyanocobalamin (B-12) 1000 MCG SUBL, Place under the tongue., Disp: , Rfl:    diclofenac Sodium (VOLTAREN) 1 % GEL, Apply 4 g topically 4 (four) times daily., Disp: , Rfl:    ezetimibe (ZETIA) 10 MG tablet, Take 1 tablet (10 mg total) by mouth daily., Disp: 90 tablet, Rfl: 3   gabapentin (NEURONTIN) 100 MG capsule, TAKE 1 CAPSULE IN THE MORNING, 1 in the afternoon AND 2 CAPSULES AT BEDTIME (Patient taking differently: TAKE 1 CAPSULE IN THE MORNING, AND 2 CAPSULES AT BEDTIME), Disp: 360  capsule, Rfl: 3   Glucosamine-Chondroit-Vit C-Mn (GLUCOSAMINE CHONDR 1500 COMPLX PO), Take by mouth., Disp: , Rfl:    icosapent Ethyl (VASCEPA) 1 g capsule, TAKE (2) CAPSULES TWICE DAILY., Disp: 120 capsule, Rfl: 12   levothyroxine (SYNTHROID) 50 MCG tablet, TAKE 1/2 TAB ON MON, WED, FRI & 1 TAB ON TUES AND THURS, Disp: 90 tablet, Rfl: 2   losartan (COZAAR) 50 MG tablet, Take 1 tablet (50 mg total) by mouth daily., Disp: 90 tablet, Rfl: 3   meloxicam (MOBIC) 15 MG tablet, Take 1 tablet (15 mg total) by mouth  daily. (Patient taking differently: Take 15 mg by mouth as needed.), Disp: 90 tablet, Rfl: 3   Multiple Vitamin (MULTI-VITAMIN PO), Take by mouth daily., Disp: , Rfl:    omeprazole (PRILOSEC) 20 MG capsule, TAKE 1 CAPSULE 2 TIMES A DAY BEFORE A MEAL, Disp: 180 capsule, Rfl: 0   Probiotic Product (PROBIOTIC DAILY PO), Take by mouth., Disp: , Rfl:   Allergies  Allergen Reactions   Codeine     Headache     Femring [Estradiol] Rash   Cephalexin Rash   Erythromycin Other (See Comments)    Gi  Upset     Penicillins Rash     ROS: Review of Systems Pertinent items noted in HPI and remainder of comprehensive ROS otherwise negative.    Physical exam BP 137/73    Pulse 87    Temp (!) 97.4 F (36.3 C) (Temporal)    Ht 5\' 1"  (1.549 m)    Wt 142 lb (64.4 kg)    SpO2 97%    BMI 26.83 kg/m  General appearance: alert, cooperative, appears stated age, and no distress Head: atraumatic, alopecia present Eyes: negative findings: lids and lashes normal, conjunctivae and sclerae normal, corneas clear, and pupils equal, round, reactive to light and accomodation Ears: normal TM's and external ear canals both ears Nose: Nares normal. Septum midline. Mucosa normal. No drainage or sinus tenderness. Throat: lips, mucosa, and tongue normal; teeth and gums normal Neck: no adenopathy, supple, symmetrical, trachea midline, and thyroid not enlarged, symmetric, no tenderness/mass/nodules Back:  Mild scoliosis  of the thoracic's with prominent right rib hump Lungs: clear to auscultation bilaterally Heart: regular rate and rhythm, S1, S2 normal, no murmur, click, rub or gallop Abdomen: soft, non-tender; bowel sounds normal; no masses,  no organomegaly Extremities: extremities normal, atraumatic, no cyanosis or edema Pulses: 2+ and symmetric Skin:  Few pigmented nevi on the lower extremities identified.  She has some small hemangiomas on the scalp .  Right buttock with small patch of dry skin with minimal erythema. Lymph nodes: Cervical, supraclavicular, and axillary nodes normal. Neurologic: Grossly normal    Assessment/ Plan: [name] here for annual physical exam.   Annual physical exam  Aortic atherosclerosis ([place]) - Plan: atorvastatin (LIPITOR) 20 MG tablet, ezetimibe (ZETIA) 10 MG tablet, icosapent Ethyl (VASCEPA) 1 g capsule  Essential hypertension - Plan: losartan (COZAAR) 50 MG tablet  Mixed hyperlipidemia - Plan: atorvastatin (LIPITOR) 20 MG tablet, ezetimibe (ZETIA) 10 MG tablet, icosapent Ethyl (VASCEPA) 1 g capsule  Hypothyroidism due to acquired atrophy of thyroid - Plan: levothyroxine (SYNTHROID) 50 MCG tablet  Vitamin D deficiency  Other eczema - Plan: desonide (DESOWEN) 0.05 % cream  Gastroesophageal reflux disease without esophagitis - Plan: famotidine (PEPCID) 20 MG tablet, omeprazole (PRILOSEC) 20 MG capsule  Elevated LFTs - Plan: Hepatic function panel  She is up-to-date on preventative health care.  We reviewed her labs.  Cholesterol remains under good control.  Blood pressure under good control upon recheck.  She will continue current regimen  Thyroid levels were appropriate.  Continue Synthroid  Vitamin D level low end of normal.  Encourage vitamin D supplementation with 800 international units vitamin D daily  Eczema seems to be getting better with the old 2 of desonide she has.  I renewed this.  Continue for additional week  GERD is not controlled.   Pepcid added for the next 2 to 4 weeks.  Continue omeprazole at current dose.  Mildly elevated LFT noted.  Recheck hepatic function panel  in 1 month.  Suspect this was a transient rise in the setting of recent infection   Ken Bonn M. Lajuana Ripple, DO

## 2021-08-14 NOTE — Patient Instructions (Signed)
Liver function panel ordered.  Plan to have done in a month or so.  Pepcid for 2-4 weeks in addition to the Omeprazole  Desonide for another week to that rash  HYDRATE  Preventive Care 10 Years and Older, Female Preventive care refers to lifestyle choices and visits with your health care provider that can promote health and wellness. Preventive care visits are also called wellness exams. What can I expect for my preventive care visit? Counseling Your health care provider may ask you questions about your: Medical history, including: Past medical problems. Family medical history. Pregnancy and menstrual history. History of falls. Current health, including: Memory and ability to understand (cognition). Emotional well-being. Home life and relationship well-being. Sexual activity and sexual health. Lifestyle, including: Alcohol, nicotine or tobacco, and drug use. Access to firearms. Diet, exercise, and sleep habits. Work and work Statistician. Sunscreen use. Safety issues such as seatbelt and bike helmet use. Physical exam Your health care provider will check your: Height and weight. These may be used to calculate your BMI (body mass index). BMI is a measurement that tells if you are at a healthy weight. Waist circumference. This measures the distance around your waistline. This measurement also tells if you are at a healthy weight and may help predict your risk of certain diseases, such as type 2 diabetes and high blood pressure. Heart rate and blood pressure. Body temperature. Skin for abnormal spots. What immunizations do I need? Vaccines are usually given at various ages, according to a schedule. Your health care provider will recommend vaccines for you based on your age, medical history, and lifestyle or other factors, such as travel or where you work. What tests do I need? Screening Your health care provider may recommend screening tests for certain conditions. This may  include: Lipid and cholesterol levels. Hepatitis C test. Hepatitis B test. HIV (human immunodeficiency virus) test. STI (sexually transmitted infection) testing, if you are at risk. Lung cancer screening. Colorectal cancer screening. Diabetes screening. This is done by checking your blood sugar (glucose) after you have not eaten for a while (fasting). Mammogram. Talk with your health care provider about how often you should have regular mammograms. BRCA-related cancer screening. This may be done if you have a family history of breast, ovarian, tubal, or peritoneal cancers. Bone density scan. This is done to screen for osteoporosis. Talk with your health care provider about your test results, treatment options, and if necessary, the need for more tests. Follow these instructions at home: Eating and drinking  Eat a diet that includes fresh fruits and vegetables, whole grains, lean protein, and low-fat dairy products. Limit your intake of foods with high amounts of sugar, saturated fats, and salt. Take vitamin and mineral supplements as recommended by your health care provider. Do not drink alcohol if your health care provider tells you not to drink. If you drink alcohol: Limit how much you have to 0-1 drink a day. Know how much alcohol is in your drink. In the [place] one drink equals one 12 oz bottle of beer (355 mL), one 5 oz glass of wine (148 mL), or one 1 oz glass of hard liquor (44 mL). Lifestyle Brush your teeth every morning and night with fluoride toothpaste. Floss one time each day. Exercise for at least 30 minutes 5 or more days each week. Do not use any products that contain nicotine or tobacco. These products include cigarettes, chewing tobacco, and vaping devices, such as e-cigarettes. If you need help quitting, ask your  health care provider. Do not use drugs. If you are sexually active, practice safe sex. Use a condom or other form of protection in order to prevent STIs. Take  aspirin only as told by your health care provider. Make sure that you understand how much to take and what form to take. Work with your health care provider to find out whether it is safe and beneficial for you to take aspirin daily. Ask your health care provider if you need to take a cholesterol-lowering medicine (statin). Find healthy ways to manage stress, such as: Meditation, yoga, or listening to music. Journaling. Talking to a trusted person. Spending time with friends and family. Minimize exposure to UV radiation to reduce your risk of skin cancer. Safety Always wear your seat belt while driving or riding in a vehicle. Do not drive: If you have been drinking alcohol. Do not ride with someone who has been drinking. When you are tired or distracted. While texting. If you have been using any mind-altering substances or drugs. Wear a helmet and other protective equipment during sports activities. If you have firearms in your house, make sure you follow all gun safety procedures. What's next? Visit your health care provider once a year for an annual wellness visit. Ask your health care provider how often you should have your eyes and teeth checked. Stay up to date on all vaccines. This information is not intended to replace advice given to you by your health care provider. Make sure you discuss any questions you have with your health care provider. Document Revised: 01/14/2021 Document Reviewed: 01/14/2021 Elsevier Patient Education  [place].

## 2021-08-27 DIAGNOSIS — M9903 Segmental and somatic dysfunction of lumbar region: Secondary | ICD-10-CM | POA: Diagnosis not present

## 2021-08-27 DIAGNOSIS — M9901 Segmental and somatic dysfunction of cervical region: Secondary | ICD-10-CM | POA: Diagnosis not present

## 2021-08-27 DIAGNOSIS — M9902 Segmental and somatic dysfunction of thoracic region: Secondary | ICD-10-CM | POA: Diagnosis not present

## 2021-08-27 DIAGNOSIS — M62830 Muscle spasm of back: Secondary | ICD-10-CM | POA: Diagnosis not present

## 2021-09-09 DIAGNOSIS — M9902 Segmental and somatic dysfunction of thoracic region: Secondary | ICD-10-CM | POA: Diagnosis not present

## 2021-09-09 DIAGNOSIS — M62830 Muscle spasm of back: Secondary | ICD-10-CM | POA: Diagnosis not present

## 2021-09-09 DIAGNOSIS — M9901 Segmental and somatic dysfunction of cervical region: Secondary | ICD-10-CM | POA: Diagnosis not present

## 2021-09-09 DIAGNOSIS — M9903 Segmental and somatic dysfunction of lumbar region: Secondary | ICD-10-CM | POA: Diagnosis not present

## 2021-09-21 DIAGNOSIS — M9903 Segmental and somatic dysfunction of lumbar region: Secondary | ICD-10-CM | POA: Diagnosis not present

## 2021-09-21 DIAGNOSIS — M9901 Segmental and somatic dysfunction of cervical region: Secondary | ICD-10-CM | POA: Diagnosis not present

## 2021-09-21 DIAGNOSIS — M62830 Muscle spasm of back: Secondary | ICD-10-CM | POA: Diagnosis not present

## 2021-09-21 DIAGNOSIS — M9902 Segmental and somatic dysfunction of thoracic region: Secondary | ICD-10-CM | POA: Diagnosis not present

## 2021-10-07 DIAGNOSIS — M9901 Segmental and somatic dysfunction of cervical region: Secondary | ICD-10-CM | POA: Diagnosis not present

## 2021-10-07 DIAGNOSIS — M9903 Segmental and somatic dysfunction of lumbar region: Secondary | ICD-10-CM | POA: Diagnosis not present

## 2021-10-07 DIAGNOSIS — M9902 Segmental and somatic dysfunction of thoracic region: Secondary | ICD-10-CM | POA: Diagnosis not present

## 2021-10-07 DIAGNOSIS — M62830 Muscle spasm of back: Secondary | ICD-10-CM | POA: Diagnosis not present

## 2021-10-08 DIAGNOSIS — M170 Bilateral primary osteoarthritis of knee: Secondary | ICD-10-CM | POA: Diagnosis not present

## 2021-10-14 ENCOUNTER — Encounter: Payer: Self-pay | Admitting: Family Medicine

## 2021-10-15 ENCOUNTER — Other Ambulatory Visit: Payer: Medicare PPO

## 2021-10-15 DIAGNOSIS — R7989 Other specified abnormal findings of blood chemistry: Secondary | ICD-10-CM | POA: Diagnosis not present

## 2021-10-15 DIAGNOSIS — M170 Bilateral primary osteoarthritis of knee: Secondary | ICD-10-CM | POA: Diagnosis not present

## 2021-10-16 LAB — HEPATIC FUNCTION PANEL
ALT: 34 IU/L — ABNORMAL HIGH (ref 0–32)
AST: 27 IU/L (ref 0–40)
Albumin: 5.1 g/dL — ABNORMAL HIGH (ref 3.7–4.7)
Alkaline Phosphatase: 85 IU/L (ref 44–121)
Bilirubin Total: 0.3 mg/dL (ref 0.0–1.2)
Bilirubin, Direct: 0.11 mg/dL (ref 0.00–0.40)
Total Protein: 7.2 g/dL (ref 6.0–8.5)

## 2021-10-21 DIAGNOSIS — M9902 Segmental and somatic dysfunction of thoracic region: Secondary | ICD-10-CM | POA: Diagnosis not present

## 2021-10-21 DIAGNOSIS — M9901 Segmental and somatic dysfunction of cervical region: Secondary | ICD-10-CM | POA: Diagnosis not present

## 2021-10-21 DIAGNOSIS — M9903 Segmental and somatic dysfunction of lumbar region: Secondary | ICD-10-CM | POA: Diagnosis not present

## 2021-10-21 DIAGNOSIS — M62830 Muscle spasm of back: Secondary | ICD-10-CM | POA: Diagnosis not present

## 2021-10-22 DIAGNOSIS — M170 Bilateral primary osteoarthritis of knee: Secondary | ICD-10-CM | POA: Diagnosis not present

## 2021-11-05 DIAGNOSIS — M9903 Segmental and somatic dysfunction of lumbar region: Secondary | ICD-10-CM | POA: Diagnosis not present

## 2021-11-05 DIAGNOSIS — M9902 Segmental and somatic dysfunction of thoracic region: Secondary | ICD-10-CM | POA: Diagnosis not present

## 2021-11-05 DIAGNOSIS — M9901 Segmental and somatic dysfunction of cervical region: Secondary | ICD-10-CM | POA: Diagnosis not present

## 2021-11-05 DIAGNOSIS — M62830 Muscle spasm of back: Secondary | ICD-10-CM | POA: Diagnosis not present

## 2021-11-16 DIAGNOSIS — M9901 Segmental and somatic dysfunction of cervical region: Secondary | ICD-10-CM | POA: Diagnosis not present

## 2021-11-16 DIAGNOSIS — M62830 Muscle spasm of back: Secondary | ICD-10-CM | POA: Diagnosis not present

## 2021-11-16 DIAGNOSIS — M9902 Segmental and somatic dysfunction of thoracic region: Secondary | ICD-10-CM | POA: Diagnosis not present

## 2021-11-16 DIAGNOSIS — M9903 Segmental and somatic dysfunction of lumbar region: Secondary | ICD-10-CM | POA: Diagnosis not present

## 2021-12-02 DIAGNOSIS — M9903 Segmental and somatic dysfunction of lumbar region: Secondary | ICD-10-CM | POA: Diagnosis not present

## 2021-12-02 DIAGNOSIS — M9902 Segmental and somatic dysfunction of thoracic region: Secondary | ICD-10-CM | POA: Diagnosis not present

## 2021-12-02 DIAGNOSIS — M62830 Muscle spasm of back: Secondary | ICD-10-CM | POA: Diagnosis not present

## 2021-12-02 DIAGNOSIS — M9901 Segmental and somatic dysfunction of cervical region: Secondary | ICD-10-CM | POA: Diagnosis not present

## 2021-12-16 DIAGNOSIS — M9901 Segmental and somatic dysfunction of cervical region: Secondary | ICD-10-CM | POA: Diagnosis not present

## 2021-12-16 DIAGNOSIS — M9903 Segmental and somatic dysfunction of lumbar region: Secondary | ICD-10-CM | POA: Diagnosis not present

## 2021-12-16 DIAGNOSIS — M9902 Segmental and somatic dysfunction of thoracic region: Secondary | ICD-10-CM | POA: Diagnosis not present

## 2021-12-16 DIAGNOSIS — M62830 Muscle spasm of back: Secondary | ICD-10-CM | POA: Diagnosis not present

## 2021-12-30 ENCOUNTER — Encounter: Payer: Self-pay | Admitting: Family Medicine

## 2021-12-30 DIAGNOSIS — M9903 Segmental and somatic dysfunction of lumbar region: Secondary | ICD-10-CM | POA: Diagnosis not present

## 2021-12-30 DIAGNOSIS — M25531 Pain in right wrist: Secondary | ICD-10-CM

## 2021-12-30 DIAGNOSIS — M62830 Muscle spasm of back: Secondary | ICD-10-CM | POA: Diagnosis not present

## 2021-12-30 DIAGNOSIS — M9901 Segmental and somatic dysfunction of cervical region: Secondary | ICD-10-CM | POA: Diagnosis not present

## 2021-12-30 DIAGNOSIS — M9902 Segmental and somatic dysfunction of thoracic region: Secondary | ICD-10-CM | POA: Diagnosis not present

## 2021-12-30 DIAGNOSIS — K5904 Chronic idiopathic constipation: Secondary | ICD-10-CM

## 2021-12-30 MED ORDER — LINACLOTIDE 72 MCG PO CAPS: 72.0000 ug | ORAL_CAPSULE | Freq: Every day | ORAL | 0 refills | Status: DC

## 2021-12-30 NOTE — Telephone Encounter (Signed)
Telephone visit  Subjective: HQ:IONGE pain PCP: [name], DO [id number] R Marissa Perez is a 76 y.o. female calls for telephone consult today. Patient provides verbal consent for consult held via phone.  Due to COVID-19 pandemic this visit was conducted virtually. This visit type was conducted due to national recommendations for restrictions regarding the COVID-19 Pandemic (e.g. social distancing, sheltering in place) in an effort to limit this patient's exposure and mitigate transmission in our community. All issues noted in this document were discussed and addressed.  A physical exam was not performed with this format.   Location of patient: home Location of provider: [facility] Others present for call: spouse  1. Wrist pain Patient feels like wrist pain is very similar to what she experienced several years ago.  She has known OA of fingers and wonders if this is impacting.  She has carpal tunnel syndrome.  She reports dorsal wrist pain that radiates to the thumb bilaterally.   She reports decreased grip and difficulty holding glasses/ opening jars or any twisting motion.  Using Voltaren gel and compression gloves.  She's been avoiding Mobic because she is having some GI issues that this med exacerbates.  2. Constipation Patient is having worsening constipation (recurrent in last month).  She is drinking water but she reports bloating.  On Sunday, she couldn't even get into her normal jeans.  No nausea, vomiting, blood in stool.  Using colace.  Has used MiraLAX and Metamucil in the past with various degrees of improvement.  Last colonoscopy in 2015.   ROS: Per HPI  Allergies  Allergen Reactions   Codeine     Headache     Femring [Estradiol] Rash   Cephalexin Rash   Erythromycin Other (See Comments)    Gi  Upset     Penicillins Rash   Past Medical History:  Diagnosis Date   Acid reflux    Allergy    Arthritis    Diverticulosis    GERD (gastroesophageal reflux disease)     History of ETT 7/10   abnormal    History of stomach ulcers 2008   positive h. pylori   Hyperlipidemia    Hypertension    Hypothyroidism    IBS (irritable bowel syndrome)    Interstitial cystitis    Skin cancer    basal and squamous    Current Outpatient Medications:    atorvastatin (LIPITOR) 20 MG tablet, Take 1 tablet (20 mg total) by mouth daily., Disp: 90 tablet, Rfl: 3   BIOTIN PO, Take by mouth., Disp: , Rfl:    Calcium Citrate-Vitamin D (CALCIUM CITRATE + D3 MAXIMUM) 315-250 MG-UNIT TABS, Take 1 tablet by mouth 2 (two) times daily. (Patient taking differently: Take 1 tablet by mouth 2 (two) times daily. With magnesium), Disp: 120 tablet, Rfl:    Cholecalciferol (VITAMIN D PO), Take 5,000 Units by mouth daily., Disp: , Rfl:    clobetasol (TEMOVATE) 0.05 % external solution, Apply 1 application topically daily as needed. , Disp: , Rfl:    Cyanocobalamin (B-12) 1000 MCG SUBL, Place under the tongue., Disp: , Rfl:    desonide (DESOWEN) 0.05 % cream, Apply topically 2 (two) times daily. X1 week, Disp: 30 g, Rfl: 0   diclofenac Sodium (VOLTAREN) 1 % GEL, Apply 4 g topically 4 (four) times daily., Disp: , Rfl:    ezetimibe (ZETIA) 10 MG tablet, Take 1 tablet (10 mg total) by mouth daily., Disp: 90 tablet, Rfl: 3   famotidine (PEPCID) 20 MG tablet, Take 1  tablet (20 mg total) by mouth daily. X2-4 weeks, Disp: 30 tablet, Rfl: 0   gabapentin (NEURONTIN) 100 MG capsule, TAKE 1 CAPSULE IN THE MORNING, AND 2 CAPSULES AT BEDTIME, Disp: 270 capsule, Rfl: 3   Glucosamine-Chondroit-Vit C-Mn (GLUCOSAMINE CHONDR 1500 COMPLX PO), Take by mouth., Disp: , Rfl:    icosapent Ethyl (VASCEPA) 1 g capsule, TAKE (2) CAPSULES TWICE DAILY., Disp: 360 capsule, Rfl: 3   levothyroxine (SYNTHROID) 50 MCG tablet, Take 1/2 tablet on Mon, Wed, Fri and 1 tablet daily Tues and Thursday, Disp: 90 tablet, Rfl: 3   losartan (COZAAR) 50 MG tablet, Take 1 tablet (50 mg total) by mouth daily., Disp: 90 tablet, Rfl: 3    meloxicam (MOBIC) 15 MG tablet, Take 1 tablet (15 mg total) by mouth daily. (Patient taking differently: Take 15 mg by mouth as needed.), Disp: 90 tablet, Rfl: 3   Multiple Vitamin (MULTI-VITAMIN PO), Take by mouth daily., Disp: , Rfl:    omeprazole (PRILOSEC) 20 MG capsule, TAKE 1 CAPSULE 2 TIMES A DAY BEFORE A MEAL, Disp: 180 capsule, Rfl: 3   Probiotic Product (PROBIOTIC DAILY PO), Take by mouth., Disp: , Rfl:   Assessment/ Plan: 76 y.o. female   Bilateral wrist pain - Plan: Ambulatory referral to Orthopedic Surgery  Chronic idiopathic constipation - Plan: linaclotide (LINZESS) 72 MCG capsule  Referred her back to [facility] for her wrist.  She technically is an established patient there so I did advise her to consider just calling to see if perhaps she might be able to just ask for one of the partners that works on the wrists.  But I did go ahead and just place an order in case she needs it for insurance purposes  with regards to the chronic constipation, 12 days worth of samples of 72, 145 to 90 mcg Linzess placed upfront.  Instructions for use discussed with the patient and written on the bag.  She will let me know which of these doses works best for her and I will be glad to prescribe  Meds ordered this encounter  Medications   linaclotide (LINZESS) 72 MCG capsule    Sig: Take 1 capsule (72 mcg total) by mouth daily before breakfast. If no improvement, advance to 150mg. If no improvement, advance to 2968m    Dispense:  4 capsule    Refill:  0     Start time: 3:42pm End time: 3:55pm  Total time spent on patient care (including telephone call/ virtual visit): 13 minutes  [phone]

## 2022-01-01 ENCOUNTER — Other Ambulatory Visit: Payer: Self-pay | Admitting: Family Medicine

## 2022-01-01 DIAGNOSIS — K5904 Chronic idiopathic constipation: Secondary | ICD-10-CM

## 2022-01-01 MED ORDER — LINACLOTIDE 72 MCG PO CAPS: 72.0000 ug | ORAL_CAPSULE | Freq: Every day | ORAL | 3 refills | Status: DC

## 2022-01-08 DIAGNOSIS — M25551 Pain in right hip: Secondary | ICD-10-CM | POA: Diagnosis not present

## 2022-01-13 IMAGING — DX DG CHEST 2V
2 series · 2 of 2 positions shown · non-contrast
Comparison: Chest radiographs 12/28/2017

CLINICAL DATA: Question hiatal hernia.

EXAM:
CHEST - 2 VIEW

[chest pa]
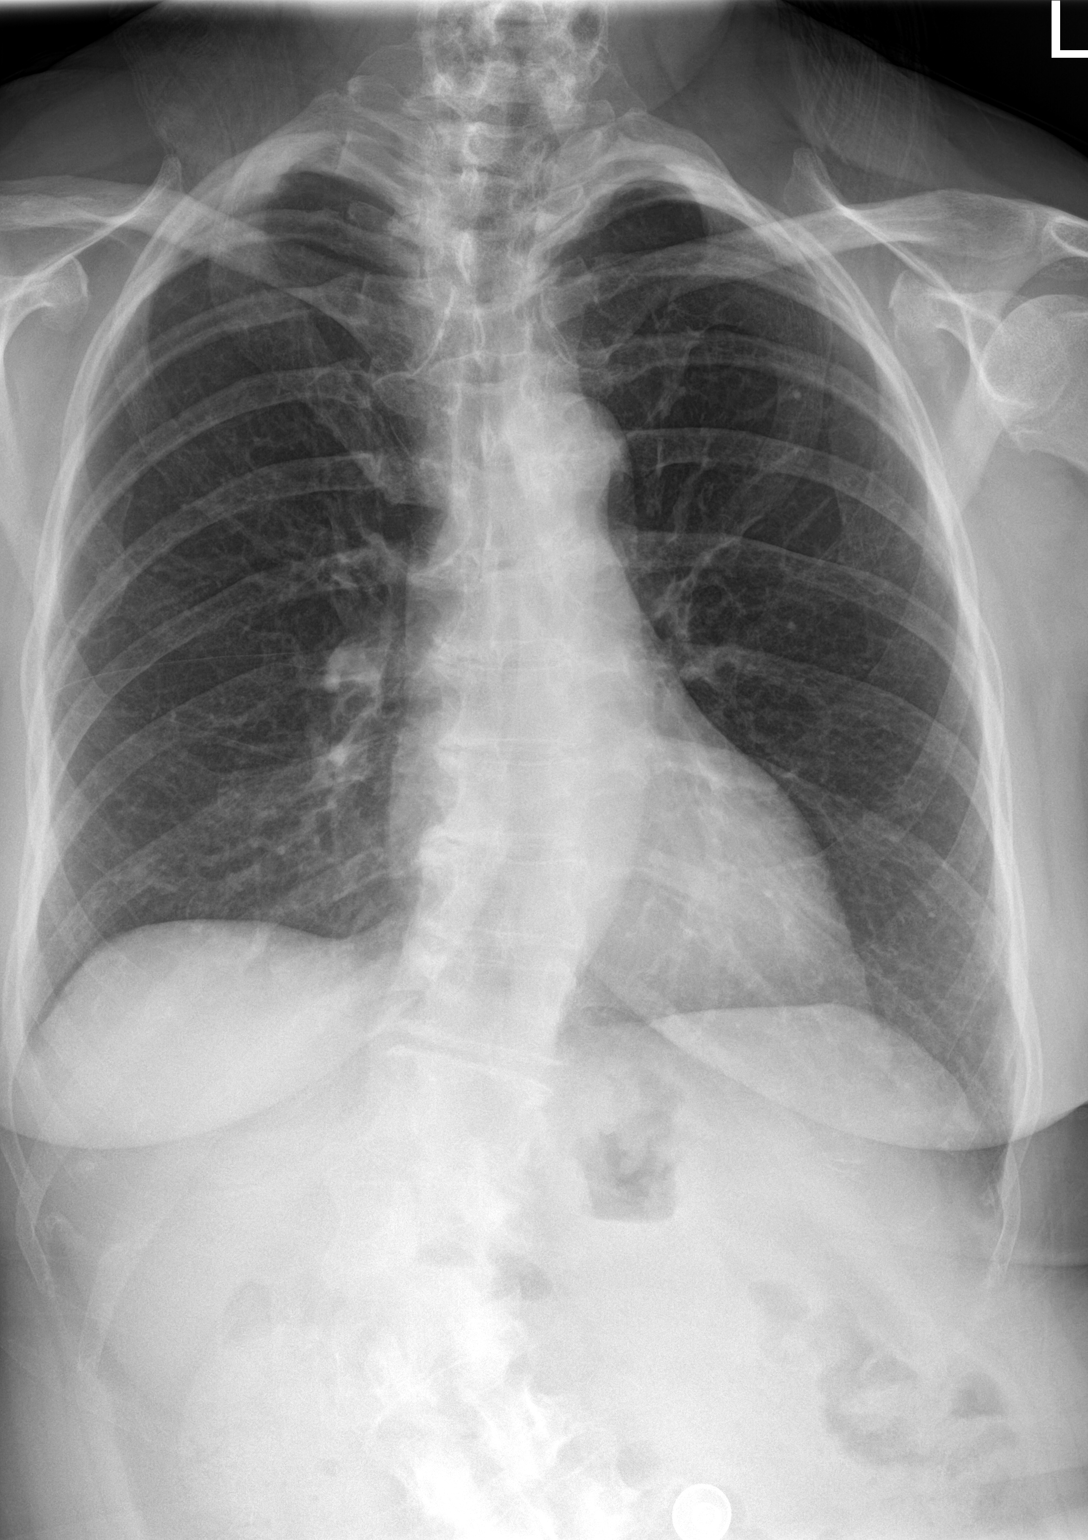

[chest lat]
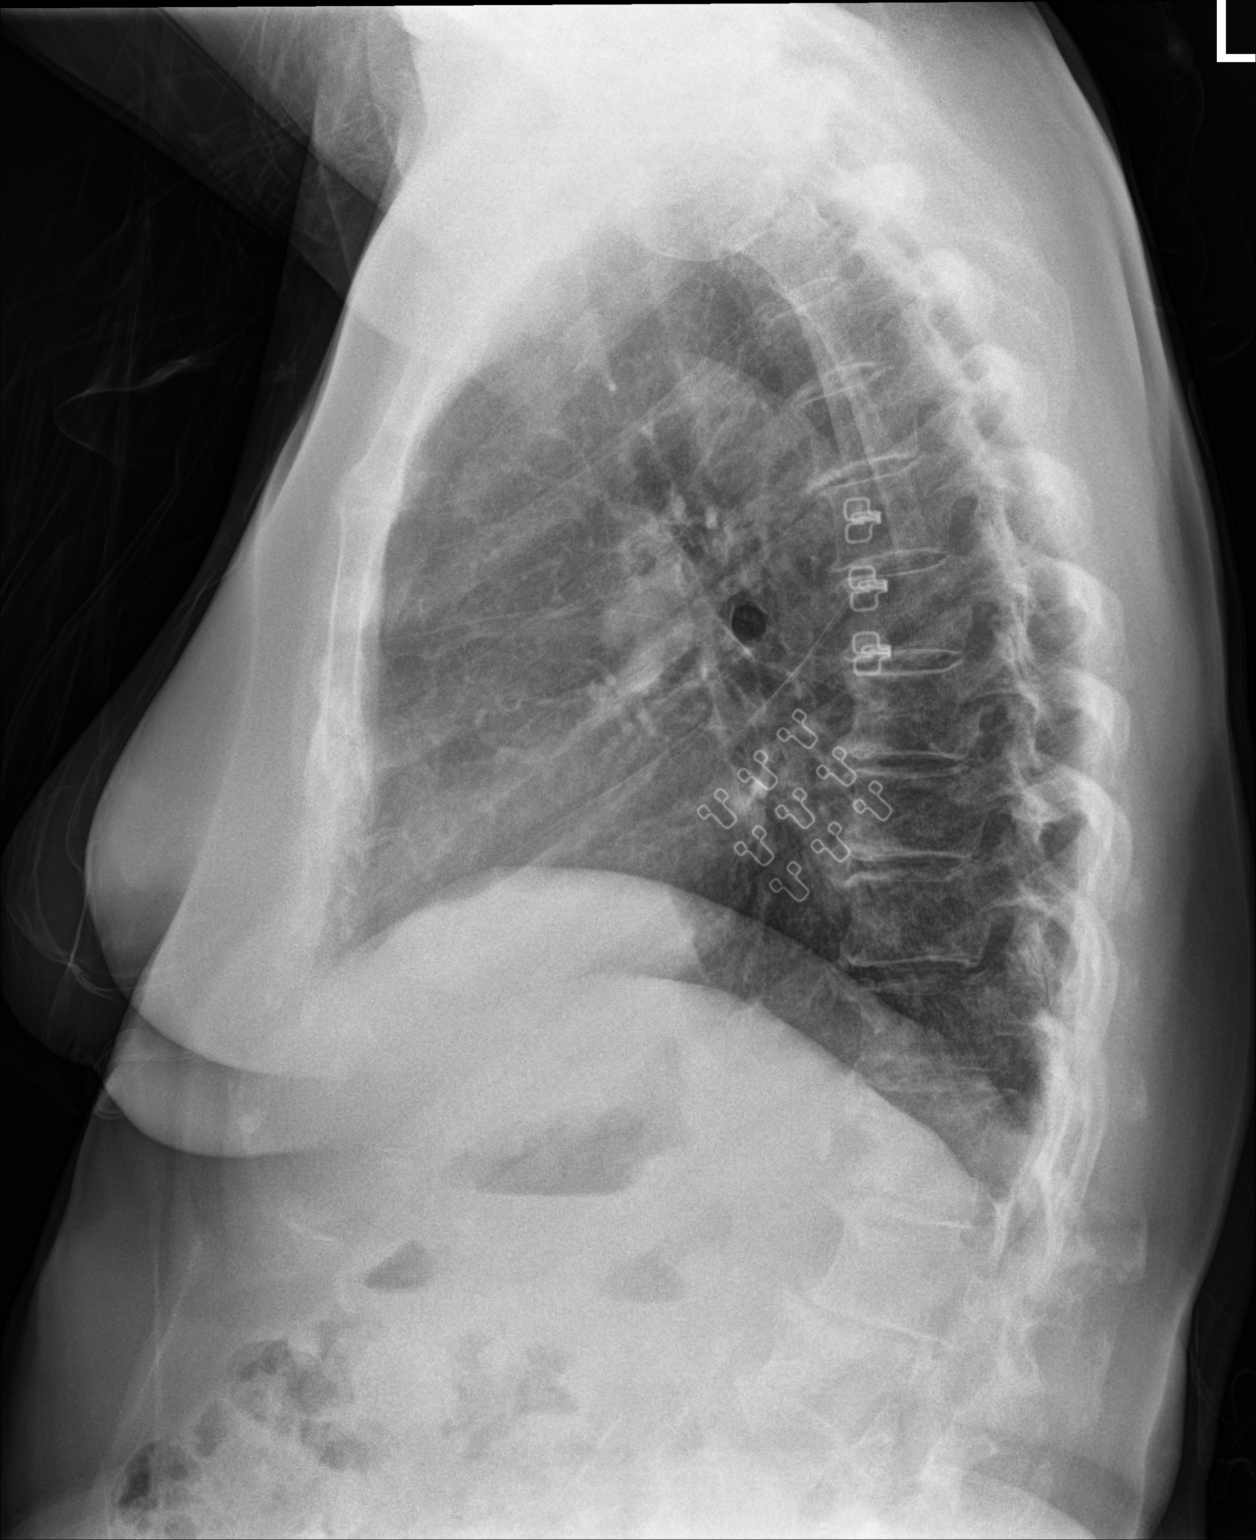

[2 of 2 positions shown; findings below may reference images not displayed]

FINDINGS: The heart size and mediastinal contours are stable. There is no
visible hiatal hernia. The lungs appear clear. There is no pleural
effusion or pneumothorax. The bones appear unchanged with mild
spondylosis associated with a thoracolumbar scoliosis.
IMPRESSION: No active cardiopulmonary process. No visible hiatal hernia.

## 2022-01-14 DIAGNOSIS — M9902 Segmental and somatic dysfunction of thoracic region: Secondary | ICD-10-CM | POA: Diagnosis not present

## 2022-01-14 DIAGNOSIS — M9903 Segmental and somatic dysfunction of lumbar region: Secondary | ICD-10-CM | POA: Diagnosis not present

## 2022-01-14 DIAGNOSIS — M9901 Segmental and somatic dysfunction of cervical region: Secondary | ICD-10-CM | POA: Diagnosis not present

## 2022-01-14 DIAGNOSIS — M62830 Muscle spasm of back: Secondary | ICD-10-CM | POA: Diagnosis not present

## 2022-01-15 DIAGNOSIS — M180 Bilateral primary osteoarthritis of first carpometacarpal joints: Secondary | ICD-10-CM | POA: Diagnosis not present

## 2022-01-15 DIAGNOSIS — M170 Bilateral primary osteoarthritis of knee: Secondary | ICD-10-CM | POA: Diagnosis not present

## 2022-01-15 DIAGNOSIS — M1811 Unilateral primary osteoarthritis of first carpometacarpal joint, right hand: Secondary | ICD-10-CM | POA: Insufficient documentation

## 2022-01-15 DIAGNOSIS — M1812 Unilateral primary osteoarthritis of first carpometacarpal joint, left hand: Secondary | ICD-10-CM | POA: Insufficient documentation

## 2022-01-26 ENCOUNTER — Encounter: Payer: Self-pay | Admitting: Family Medicine

## 2022-01-28 DIAGNOSIS — M9903 Segmental and somatic dysfunction of lumbar region: Secondary | ICD-10-CM | POA: Diagnosis not present

## 2022-01-28 DIAGNOSIS — M62830 Muscle spasm of back: Secondary | ICD-10-CM | POA: Diagnosis not present

## 2022-01-28 DIAGNOSIS — M9901 Segmental and somatic dysfunction of cervical region: Secondary | ICD-10-CM | POA: Diagnosis not present

## 2022-01-28 DIAGNOSIS — M9902 Segmental and somatic dysfunction of thoracic region: Secondary | ICD-10-CM | POA: Diagnosis not present

## 2022-01-29 ENCOUNTER — Ambulatory Visit (INDEPENDENT_AMBULATORY_CARE_PROVIDER_SITE_OTHER): Payer: Medicare PPO

## 2022-01-29 VITALS — Wt 133.0 lb

## 2022-01-29 DIAGNOSIS — Z0000 Encounter for general adult medical examination without abnormal findings: Secondary | ICD-10-CM | POA: Diagnosis not present

## 2022-01-29 NOTE — Patient Instructions (Signed)
Marissa Perez , Thank you for taking time to come for your Medicare Wellness Visit. I appreciate your ongoing commitment to your health goals. Please review the following plan we discussed and let me know if I can assist you in the future.   Screening recommendations/referrals: Colonoscopy: Done 10/10/2013 - Repeat in 10 years Mammogram: Done 05/01/2021 - Repeat annually Bone Density: Done 08/14/2020 - Repeat every 2 years Recommended yearly ophthalmology/optometry visit for glaucoma screening and checkup Recommended yearly dental visit for hygiene and checkup  Vaccinations: Influenza vaccine: Done 05/06/2021 - Repeat annually  Pneumococcal vaccine: Done  01/31/2011 & 08/29/2013 Tdap vaccine: Done 02/10/2021 - Repeat in 10 years Shingles vaccine: Done  01/03/2017 & 04/11/2017 Covid-19:Done  08/22/2019, 09/10/2019, 05/28/2020, 8/2/202 & 06/30/2021  Advanced directives: .in chart  Conditions/risks identified: Keep up the great work! Aim for 30 minutes of exercise or brisk walking, 6-8 glasses of water, and 5 servings of fruits and vegetables each day.   Next appointment: Follow up in one year for your annual wellness visit    Preventive Care 65 Years and Older, Female Preventive care refers to lifestyle choices and visits with your health care provider that can promote health and wellness. What does preventive care include? A yearly physical exam. This is also called an annual well check. Dental exams once or twice a year. Routine eye exams. Ask your health care provider how often you should have your eyes checked. Personal lifestyle choices, including: Daily care of your teeth and gums. Regular physical activity. Eating a healthy diet. Avoiding tobacco and drug use. Limiting alcohol use. Practicing safe sex. Taking low-dose aspirin every day. Taking vitamin and mineral supplements as recommended by your health care provider. What happens during an annual well check? The services and screenings  done by your health care provider during your annual well check will depend on your age, overall health, lifestyle risk factors, and family history of disease. Counseling  Your health care provider may ask you questions about your: Alcohol use. Tobacco use. Drug use. Emotional well-being. Home and relationship well-being. Sexual activity. Eating habits. History of falls. Memory and ability to understand (cognition). Work and work Statistician. Reproductive health. Screening  You may have the following tests or measurements: Height, weight, and BMI. Blood pressure. Lipid and cholesterol levels. These may be checked every 5 years, or more frequently if you are over 21 years old. Skin check. Lung cancer screening. You may have this screening every year starting at age 68 if you have a 30-pack-year history of smoking and currently smoke or have quit within the past 15 years. Fecal occult blood test (FOBT) of the stool. You may have this test every year starting at age 13. Flexible sigmoidoscopy or colonoscopy. You may have a sigmoidoscopy every 5 years or a colonoscopy every 10 years starting at age 27. Hepatitis C blood test. Hepatitis B blood test. Sexually transmitted disease (STD) testing. Diabetes screening. This is done by checking your blood sugar (glucose) after you have not eaten for a while (fasting). You may have this done every 1-3 years. Bone density scan. This is done to screen for osteoporosis. You may have this done starting at age 45. Mammogram. This may be done every 1-2 years. Talk to your health care provider about how often you should have regular mammograms. Talk with your health care provider about your test results, treatment options, and if necessary, the need for more tests. Vaccines  Your health care provider may recommend certain vaccines, such as:  Influenza vaccine. This is recommended every year. Tetanus, diphtheria, and acellular pertussis (Tdap, Td)  vaccine. You may need a Td booster every 10 years. Zoster vaccine. You may need this after age 67. Pneumococcal 13-valent conjugate (PCV13) vaccine. One dose is recommended after age 53. Pneumococcal polysaccharide (PPSV23) vaccine. One dose is recommended after age 31. Talk to your health care provider about which screenings and vaccines you need and how often you need them. This information is not intended to replace advice given to you by your health care provider. Make sure you discuss any questions you have with your health care provider. Document Released: 08/15/2015 Document Revised: 04/07/2016 Document Reviewed: 05/20/2015 Elsevier Interactive Patient Education  2017 [place] Prevention in the Home Falls can cause injuries. They can happen to people of all ages. There are many things you can do to make your home safe and to help prevent falls. What can I do on the outside of my home? Regularly fix the edges of walkways and driveways and fix any cracks. Remove anything that might make you trip as you walk through a door, such as a raised step or threshold. Trim any bushes or trees on the path to your home. Use bright outdoor lighting. Clear any walking paths of anything that might make someone trip, such as rocks or tools. Regularly check to see if handrails are loose or broken. Make sure that both sides of any steps have handrails. Any raised decks and porches should have guardrails on the edges. Have any leaves, snow, or ice cleared regularly. Use sand or salt on walking paths during winter. Clean up any spills in your garage right away. This includes oil or grease spills. What can I do in the bathroom? Use night lights. Install grab bars by the toilet and in the tub and shower. Do not use towel bars as grab bars. Use non-skid mats or decals in the tub or shower. If you need to sit down in the shower, use a plastic, non-slip stool. Keep the floor dry. Clean up any  water that spills on the floor as soon as it happens. Remove soap buildup in the tub or shower regularly. Attach bath mats securely with double-sided non-slip rug tape. Do not have throw rugs and other things on the floor that can make you trip. What can I do in the bedroom? Use night lights. Make sure that you have a light by your bed that is easy to reach. Do not use any sheets or blankets that are too big for your bed. They should not hang down onto the floor. Have a firm chair that has side arms. You can use this for support while you get dressed. Do not have throw rugs and other things on the floor that can make you trip. What can I do in the kitchen? Clean up any spills right away. Avoid walking on wet floors. Keep items that you use a lot in easy-to-reach places. If you need to reach something above you, use a strong step stool that has a grab bar. Keep electrical cords out of the way. Do not use floor polish or wax that makes floors slippery. If you must use wax, use non-skid floor wax. Do not have throw rugs and other things on the floor that can make you trip. What can I do with my stairs? Do not leave any items on the stairs. Make sure that there are handrails on both sides of the stairs and use them.  Fix handrails that are broken or loose. Make sure that handrails are as long as the stairways. Check any carpeting to make sure that it is firmly attached to the stairs. Fix any carpet that is loose or worn. Avoid having throw rugs at the top or bottom of the stairs. If you do have throw rugs, attach them to the floor with carpet tape. Make sure that you have a light switch at the top of the stairs and the bottom of the stairs. If you do not have them, ask someone to add them for you. What else can I do to help prevent falls? Wear shoes that: Do not have high heels. Have rubber bottoms. Are comfortable and fit you well. Are closed at the toe. Do not wear sandals. If you use a  stepladder: Make sure that it is fully opened. Do not climb a closed stepladder. Make sure that both sides of the stepladder are locked into place. Ask someone to hold it for you, if possible. Clearly mark and make sure that you can see: Any grab bars or handrails. First and last steps. Where the edge of each step is. Use tools that help you move around (mobility aids) if they are needed. These include: Canes. Walkers. Scooters. Crutches. Turn on the lights when you go into a dark area. Replace any light bulbs as soon as they burn out. Set up your furniture so you have a clear path. Avoid moving your furniture around. If any of your floors are uneven, fix them. If there are any pets around you, be aware of where they are. Review your medicines with your doctor. Some medicines can make you feel dizzy. This can increase your chance of falling. Ask your doctor what other things that you can do to help prevent falls. This information is not intended to replace advice given to you by your health care provider. Make sure you discuss any questions you have with your health care provider. Document Released: 05/15/2009 Document Revised: 12/25/2015 Document Reviewed: 08/23/2014 Elsevier Interactive Patient Education  2017 [employer].

## 2022-01-29 NOTE — Progress Notes (Signed)
Subjective:   Marissa Perez is a 76 y.o. female who presents for Medicare Annual (Subsequent) preventive examination.  Virtual Visit via Telephone Note  I connected with  LAURINE KUYPER on 01/29/22 at  1:15 PM EDT by telephone and verified that I am speaking with the correct person using two identifiers.  Location: Patient: Home Provider: [facility] Persons participating in the virtual visit: patient/Nurse Health Advisor   I discussed the limitations, risks, security and privacy concerns of performing an evaluation and management service by telephone and the availability of in person appointments. The patient expressed understanding and agreed to proceed.  Interactive audio and video telecommunications were attempted between this nurse and patient, however failed, due to patient having technical difficulties OR patient did not have access to video capability.  We continued and completed visit with audio only.  Some vital signs may be absent or patient reported.   Kerrington Sova E Albertia Carvin, LPN   Review of Systems     Cardiac Risk Factors include: advanced age (>30mn, >>31women);dyslipidemia;hypertension;Other (see comment), Risk factor comments: atherosclerosis     Objective:    Today's Vitals   01/29/22 1315  Weight: 133 lb (60.3 kg)   Body mass index is 25.13 kg/m.     01/29/2022    1:21 PM 01/28/2020    1:18 PM 01/12/2019   10:02 AM 01/10/2018    8:22 AM 01/03/2017    9:06 AM 12/12/2015   12:33 PM  Advanced Directives  Does Patient Have a Medical Advance Directive? Yes Yes Yes Yes Yes Yes  Type of AParamed[place]of [place]Living will [place]San Is[place]Living will [place]Living will HDollar PointLiving will HRio CommunitiesLiving will HLomitaLiving will  Does patient want to make changes to medical advance directive?  No - Patient declined No - Patient declined   No - Patient  declined  Copy of [place] Chart? Yes - validated most recent copy scanned in chart (See row information) No - copy requested Yes - validated most recent copy scanned in chart (See row information) No - copy requested No - copy requested No - copy requested    Current Medications (verified) Outpatient Encounter Medications as of 01/29/2022  Medication Sig   atorvastatin (LIPITOR) 20 MG tablet Take 1 tablet (20 mg total) by mouth daily.   BIOTIN PO Take by mouth.   Calcium Citrate-Vitamin D (CALCIUM CITRATE + D3 MAXIMUM) 315-250 MG-UNIT TABS Take 1 tablet by mouth 2 (two) times daily. (Patient taking differently: Take 1 tablet by mouth 2 (two) times daily. With magnesium)   Cholecalciferol (VITAMIN D PO) Take 5,000 Units by mouth daily.   clobetasol (TEMOVATE) 0.05 % external solution Apply 1 application topically daily as needed.    Cyanocobalamin (B-12) 1000 MCG SUBL Place under the tongue.   desonide (DESOWEN) 0.05 % cream Apply topically 2 (two) times daily. X1 week   diclofenac Sodium (VOLTAREN) 1 % GEL Apply 4 g topically 4 (four) times daily.   ezetimibe (ZETIA) 10 MG tablet Take 1 tablet (10 mg total) by mouth daily.   famotidine (PEPCID) 20 MG tablet Take 1 tablet (20 mg total) by mouth daily. X2-4 weeks   gabapentin (NEURONTIN) 100 MG capsule TAKE 1 CAPSULE IN THE MORNING, AND 2 CAPSULES AT BEDTIME   Glucosamine-Chondroit-Vit C-Mn (GLUCOSAMINE CHONDR 1500 COMPLX PO) Take by mouth.   icosapent Ethyl (VASCEPA) 1 g capsule TAKE (2) CAPSULES TWICE DAILY.  levothyroxine (SYNTHROID) 50 MCG tablet Take 1/2 tablet on Mon, Wed, Fri and 1 tablet daily Tues and Thursday   linaclotide (LINZESS) 72 MCG capsule Take 1 capsule (72 mcg total) by mouth daily before breakfast. If no improvement, advance to 146mg. If no improvement, advance to 2940m   losartan (COZAAR) 50 MG tablet Take 1 tablet (50 mg total) by mouth daily.   meloxicam (MOBIC) 15 MG tablet Take 1 tablet (15 mg  total) by mouth daily as needed for pain.   Multiple Vitamin (MULTI-VITAMIN PO) Take by mouth daily.   omeprazole (PRILOSEC) 20 MG capsule TAKE 1 CAPSULE 2 TIMES A DAY BEFORE A MEAL   Probiotic Product (PROBIOTIC DAILY PO) Take by mouth.   No facility-administered encounter medications on file as of 01/29/2022.    Allergies (verified) Codeine, Femring [estradiol], Cephalexin, Erythromycin, and Penicillins   History: Past Medical History:  Diagnosis Date   Acid reflux    Allergy    Arthritis    Diverticulosis    GERD (gastroesophageal reflux disease)    History of ETT 7/10   abnormal    History of stomach ulcers 2008   positive h. pylori   Hyperlipidemia    Hypertension    Hypothyroidism    IBS (irritable bowel syndrome)    Interstitial cystitis    Skin cancer    basal and squamous   Past Surgical History:  Procedure Laterality Date   CESAREAN SECTION  1972   MOHS SURGERY     TONSILLECTOMY  1951   TOTAL ABDOMINAL HYSTERECTOMY W/ BILATERAL SALPINGOOPHORECTOMY  1990   Dr. SmTamala Julian fibroids    Family History  Problem Relation Age of Onset   Diabetes Father    Heart disease Father    Heart attack Father [id number] Breast cancer Mother    Breast cancer Paternal Grandmother        Aunt also    Breast cancer Other        Family History   Breast cancer Maternal Aunt    Breast cancer Paternal Aunt    Colon cancer Neg Hx    Esophageal cancer Neg Hx    Rectal cancer Neg Hx    Stomach cancer Neg Hx    Social History   Socioeconomic History   Marital status: Married    Spouse name: RoFrazier Butt  Number of children: 1   Years of education: 16   Highest education level: Bachelor's degree (e.g., BA, AB, BS)  Occupational History   Occupation: Retired    EmFish farm managerROEngineer, water  Comment: Teacher  Tobacco Use   Smoking status: Never   Smokeless tobacco: Never  Vaping Use   Vaping Use: Never used  Substance and Sexual Activity   Alcohol use: Yes    Alcohol/week:  7.0 standard drinks of alcohol    Types: 7 Glasses of wine per week    Comment: 1 daily   Drug use: No   Sexual activity: Never  Other Topics Concern   Not on file  Social History Narrative   Lives home with her husband "GrMarya AmslerTheir son lives in [place]   Social Determinants of Health   Financial Resource Strain: Low Risk  (01/29/2022)   Overall Financial Resource Strain (CARDIA)    Difficulty of Paying Living Expenses: Not hard at all  Food Insecurity: No Food Insecurity (01/29/2022)   Hunger Vital Sign    Worried About Running Out of Food in the Last Year: Never  true    Ran Out of Food in the Last Year: Never true  Transportation Needs: No Transportation Needs (01/29/2022)   PRAPARE - Hydrologist (Medical): No    Lack of Transportation (Non-Medical): No  Physical Activity: Insufficiently Active (01/29/2022)   Exercise Vital Sign    Days of Exercise per Week: 7 days    Minutes of Exercise per Session: 20 min  Stress: No Stress Concern Present (01/29/2022)   [place]    Feeling of Stress : Only a little  Social Connections: Socially Integrated (01/29/2022)   Social Connection and Isolation Panel [NHANES]    Frequency of Communication with Friends and Family: More than three times a week    Frequency of Social Gatherings with Friends and Family: More than three times a week    Attends Religious Services: More than 4 times per year    Active Member of [employer] or Organizations: Yes    Attends Music therapist: More than 4 times per year    Marital Status: Married    Tobacco Counseling Counseling given: Not Answered   Clinical Intake:  Pre-visit preparation completed: Yes  Pain : No/denies pain     BMI - recorded: 25.13 Nutritional Status: BMI 25 -29 Overweight Nutritional Risks: None Diabetes: No  How often do you need to have someone help you  when you read instructions, pamphlets, or other written materials from your doctor or pharmacy?: 1 - Never  Diabetic? no  Interpreter Needed?: No  Information entered by :: Tiani Stanbery, LPN   Activities of Daily Living    01/29/2022    1:22 PM  In your present state of health, do you have any difficulty performing the following activities:  Hearing? 0  Vision? 0  Difficulty concentrating or making decisions? 0  Walking or climbing stairs? 0  Dressing or bathing? 0  Doing errands, shopping? 0  Preparing Food and eating ? N  Using the Toilet? N  In the past six months, have you accidently leaked urine? N  Do you have problems with loss of bowel control? Y  Comment only due to Linzess - is working on finding right dose  Managing your Medications? N  Managing your Finances? N  Housekeeping or managing your Housekeeping? N    Patient Care Team: [name], DO as PCP - General (Family Medicine) Druscilla Brownie, MD as Consulting Physician (Dermatology) Inda Castle, MD (Inactive) as Consulting Physician (Gastroenterology) Debbra Riding, MD as Consulting Physician (Ophthalmology) Lavena Bullion, DO as Consulting Physician (Gastroenterology) Ralene Muskrat as Physician Assistant (Chiropractic Medicine)  Indicate any recent Medical Services you may have received from other than [facility] providers in the past year (date may be approximate).     Assessment:   This is a routine wellness examination for Yaniyah.  Hearing/Vision screen Hearing Screening - Comments:: Denies hearing difficulties   Vision Screening - Comments:: Wears rx glasses - up to date with routine eye exams with [facility]  Dietary issues and exercise activities discussed: Current Exercise Habits: Home exercise routine, Type of exercise: walking, Time (Minutes): 20, Frequency (Times/Week): 7, Weekly Exercise (Minutes/Week): 140, Intensity: Mild, Exercise limited by: orthopedic condition(s)    Goals Addressed             This Visit's Progress    Exercise 150 min/wk Moderate Activity   On track    Walking daily     Patient  Stated   On track    01/29/2022 AWV Goal: Fall Prevention  Over the next year, patient will decrease their risk for falls by: Using assistive devices, such as a cane or walker, as needed Identifying fall risks within their home and correcting them by: Removing throw rugs Adding handrails to stairs or ramps Removing clutter and keeping a clear pathway throughout the home Increasing light, especially at night Adding shower handles/bars Raising toilet seat Identifying potential personal risk factors for falls: Medication side effects Incontinence/urgency Vestibular dysfunction Hearing loss Musculoskeletal disorders Neurological disorders Orthostatic hypotension         Depression Screen    01/29/2022    1:20 PM 08/14/2021    9:09 AM 04/08/2021    9:37 AM 02/10/2021   10:12 AM 01/28/2021    1:14 PM 08/13/2020   10:09 AM 02/11/2020   10:04 AM  PHQ 2/9 Scores  PHQ - 2 Score 0 0 0 0 0 0 0  PHQ- 9 Score   0 0  0 0    Fall Risk    01/29/2022    1:17 PM 08/14/2021    9:09 AM 04/08/2021    9:37 AM 02/10/2021   10:12 AM 01/28/2021    2:08 PM  [place] in the past year? 0 0 0 0 0  Number falls in past yr: 0    0  Injury with Fall? 0    0  Risk for fall due to : Orthopedic patient    Impaired vision;Medication side effect;Orthopedic patient  Follow up Falls prevention discussed    Falls prevention discussed    [place]:  Any stairs in or around the home? No  If so, are there any without handrails? No  Home free of loose throw rugs in walkways, pet beds, electrical cords, etc? Yes  Adequate lighting in your home to reduce risk of falls? Yes   ASSISTIVE DEVICES UTILIZED TO PREVENT FALLS:  Life alert? No  Use of a cane, walker or w/c? No  Grab bars in the bathroom? Yes  Shower chair or bench in  shower? Yes  Elevated toilet seat or a handicapped toilet? Yes   TIMED UP AND GO:  Was the test performed? No . Telephonic visit  Cognitive Function:    01/10/2018   10:56 AM 01/03/2017   11:32 AM 12/12/2015   12:41 PM  MMSE - Mini Mental State Exam  Orientation to time '5 5 5  '$ Orientation to Place '5 5 5  '$ Registration '3 3 3  '$ Attention/ Calculation '5 5 5  '$ Recall '3 3 3  '$ Language- name 2 objects '2 2 2  '$ Language- repeat '1 1 1  '$ Language- follow 3 step command '3 3 3  '$ Language- read & follow direction '1 1 1  '$ Write a sentence '1 1 1  '$ Copy design '1 1 1  '$ Total score '30 30 30        '$ 01/29/2022    1:21 PM 01/28/2020    1:22 PM 01/12/2019    9:45 AM  6CIT Screen  What Year? 0 points 0 points 0 points  What month? 0 points 0 points 0 points  What time? 0 points 0 points 0 points  Count back from 20 0 points 0 points 0 points  Months in reverse 0 points 0 points 0 points  Repeat phrase 0 points 0 points 0 points  Total Score 0 points 0 points 0 points  Immunizations Immunization History  Administered Date(s) Administered   Fluad Quad(high Dose 65+) 05/04/2019, 05/28/2020, 05/06/2021   Influenza Split 05/21/2013   Influenza, High Dose Seasonal PF 05/10/2017, 05/08/2018   Influenza, Quadrivalent, Recombinant, Inj, Pf 05/06/2016   Influenza,inj,Quad PF,6+ Mos 05/02/2015   Influenza,inj,quad, With Preservative 05/10/2014, 05/02/2017   Moderna Covid-19 Vaccine Bivalent Booster 6yr & up 06/30/2021   Moderna Sars-Covid-2 Vaccination 03/03/2021   PFIZER(Purple Top)SARS-COV-2 Vaccination 08/22/2019, 09/10/2019, 05/28/2020   Pneumococcal Conjugate-13 08/29/2013   Pneumococcal Polysaccharide-23 01/31/2011   Td 02/10/2021   Tdap 02/03/2011   Zoster Recombinat (Shingrix) 01/03/2017, 04/11/2017    TDAP status: Up to date  Flu Vaccine status: Up to date  Pneumococcal vaccine status: Up to date  Covid-19 vaccine status: Completed vaccines  Qualifies for Shingles Vaccine? Yes    Zostavax completed Yes   Shingrix Completed?: Yes  Screening Tests Health Maintenance  Topic Date Due   INFLUENZA VACCINE  03/02/2022   MAMMOGRAM  05/01/2022   DEXA SCAN  08/14/2022   TETANUS/TDAP  02/11/2031   Pneumonia Vaccine 76 Years old  Completed   COVID-19 Vaccine  Completed   Hepatitis C Screening  Completed   Zoster Vaccines- Shingrix  Completed   HPV VACCINES  Aged Out   COLONOSCOPY (Pts 45-47yrInsurance coverage will need to be confirmed)  Discontinued    Health Maintenance  There are no preventive care reminders to display for this patient.  Colorectal cancer screening: Type of screening: Colonoscopy. Completed 10/10/2013. Repeat every 10 years  Mammogram status: Completed 05/01/2021. Repeat every year  Bone Density status: Completed 08/14/2020. Results reflect: Bone density results: OSTEOPENIA. Repeat every 2 years.  Lung Cancer Screening: (Low Dose CT Chest recommended if Age 76-80ears, 30 pack-year currently smoking OR have quit w/in 15years.) does not qualify.   Additional Screening:  Hepatitis C Screening: does qualify; Completed 11/25/2016  Vision Screening: Recommended annual ophthalmology exams for early detection of glaucoma and other disorders of the eye. Is the patient up to date with their annual eye exam?  Yes  Who is the provider or what is the name of the office in which the patient attends annual eye exams? [facility] If pt is not established with a provider, would they like to be referred to a provider to establish care? No .   Dental Screening: Recommended annual dental exams for proper oral hygiene  Community Resource Referral / Chronic Care Management: CRR required this visit?  No   CCM required this visit?  No      Plan:     I have personally reviewed and noted the following in the patient's chart:   Medical and social history Use of alcohol, tobacco or illicit drugs  Current medications and supplements including opioid  prescriptions.  Functional ability and status Nutritional status Physical activity Advanced directives List of other physicians Hospitalizations, surgeries, and ER visits in previous 12 months Vitals Screenings to include cognitive, depression, and falls Referrals and appointments  In addition, I have reviewed and discussed with patient certain preventive protocols, quality metrics, and best practice recommendations. A written personalized care plan for preventive services as well as general preventive health recommendations were provided to patient.     AmSandrea HammondLPN   01/03/02/4742 Nurse Notes: None

## 2022-02-11 DIAGNOSIS — M9902 Segmental and somatic dysfunction of thoracic region: Secondary | ICD-10-CM | POA: Diagnosis not present

## 2022-02-11 DIAGNOSIS — M62830 Muscle spasm of back: Secondary | ICD-10-CM | POA: Diagnosis not present

## 2022-02-11 DIAGNOSIS — M9903 Segmental and somatic dysfunction of lumbar region: Secondary | ICD-10-CM | POA: Diagnosis not present

## 2022-02-11 DIAGNOSIS — M9901 Segmental and somatic dysfunction of cervical region: Secondary | ICD-10-CM | POA: Diagnosis not present

## 2022-02-17 ENCOUNTER — Encounter: Payer: Self-pay | Admitting: Family Medicine

## 2022-02-17 ENCOUNTER — Ambulatory Visit: Payer: Medicare PPO | Admitting: Family Medicine

## 2022-02-17 VITALS — BP 155/63 | HR 64 | Temp 97.6°F | Ht 61.0 in | Wt 135.6 lb

## 2022-02-17 DIAGNOSIS — E034 Atrophy of thyroid (acquired): Secondary | ICD-10-CM

## 2022-02-17 DIAGNOSIS — I10 Essential (primary) hypertension: Secondary | ICD-10-CM

## 2022-02-17 DIAGNOSIS — I700 Atherosclerosis of aorta: Secondary | ICD-10-CM | POA: Diagnosis not present

## 2022-02-17 DIAGNOSIS — M5441 Lumbago with sciatica, right side: Secondary | ICD-10-CM

## 2022-02-17 DIAGNOSIS — E782 Mixed hyperlipidemia: Secondary | ICD-10-CM

## 2022-02-17 DIAGNOSIS — G8929 Other chronic pain: Secondary | ICD-10-CM | POA: Diagnosis not present

## 2022-02-17 DIAGNOSIS — K5904 Chronic idiopathic constipation: Secondary | ICD-10-CM

## 2022-02-17 MED ORDER — TRULANCE 3 MG PO TABS: 1.0000 | ORAL_TABLET | Freq: Every day | ORAL | 0 refills | Status: DC

## 2022-02-17 MED ORDER — METHYLPREDNISOLONE ACETATE 40 MG/ML IJ SUSP: 40.0000 mg | Freq: Once | INTRAMUSCULAR | Status: DC

## 2022-02-17 NOTE — Progress Notes (Signed)
Subjective: CC: Chronic follow-up PCP: [name], DO SEL:TRVUY R Pennebaker is a 76 y.o. female presenting to clinic today for:  1.  Constipation Patient notes that the Linzess even at 72 mcg daily caused profuse diarrhea.  She discontinued it and was fairly regular for a while but then started becoming constipated again.  She started using Citrucel which is helping somewhat.  She is no longer having profuse diarrhea but is not totally confident that the Citrucel will maintain her bowel movements.  2.  Hypothyroidism Compliant with Synthroid.  No reports of tremor, heart palpitations.  She has chronic constipation as above  3.  Low back/right hip pain Patient was seen a few weeks ago by her orthopedist nurse practitioner/PA who gave her a corticosteroid injection for presumed trochanteric bursitis on the right.  She notes that that slightly improved some of the discomfort she had but she still has quite a bit of discomfort down that right lower extremity such that it is causing her to limp.  She denies any numbness or tingling but does report some aching down that right lower extremity.  She has an appointment again with dr Aluiscio's office for reevaluation and possible intra-articular injection but she is not sure that she can wait that long due to the discomfort and gait disturbance   ROS: Per HPI  Allergies  Allergen Reactions   Codeine     Headache     Femring [Estradiol] Rash   Cephalexin Rash   Erythromycin Other (See Comments)    Gi  Upset     Penicillins Rash   Past Medical History:  Diagnosis Date   Acid reflux    Allergy    Arthritis    Diverticulosis    GERD (gastroesophageal reflux disease)    History of ETT 7/10   abnormal    History of stomach ulcers 2008   positive h. pylori   Hyperlipidemia    Hypertension    Hypothyroidism    IBS (irritable bowel syndrome)    Interstitial cystitis    Skin cancer    basal and squamous    Current Outpatient  Medications:    atorvastatin (LIPITOR) 20 MG tablet, Take 1 tablet (20 mg total) by mouth daily., Disp: 90 tablet, Rfl: 3   BIOTIN PO, Take by mouth., Disp: , Rfl:    Calcium Citrate-Vitamin D (CALCIUM CITRATE + D3 MAXIMUM) 315-250 MG-UNIT TABS, Take 1 tablet by mouth 2 (two) times daily. (Patient taking differently: Take 1 tablet by mouth 2 (two) times daily. With magnesium), Disp: 120 tablet, Rfl:    Cholecalciferol (VITAMIN D PO), Take 5,000 Units by mouth daily., Disp: , Rfl:    clobetasol (TEMOVATE) 0.05 % external solution, Apply 1 application topically daily as needed. , Disp: , Rfl:    Cyanocobalamin (B-12) 1000 MCG SUBL, Place under the tongue., Disp: , Rfl:    desonide (DESOWEN) 0.05 % cream, Apply topically 2 (two) times daily. X1 week, Disp: 30 g, Rfl: 0   diclofenac Sodium (VOLTAREN) 1 % GEL, Apply 4 g topically 4 (four) times daily., Disp: , Rfl:    ezetimibe (ZETIA) 10 MG tablet, Take 1 tablet (10 mg total) by mouth daily., Disp: 90 tablet, Rfl: 3   famotidine (PEPCID) 20 MG tablet, Take 1 tablet (20 mg total) by mouth daily. X2-4 weeks, Disp: 30 tablet, Rfl: 0   gabapentin (NEURONTIN) 100 MG capsule, TAKE 1 CAPSULE IN THE MORNING, AND 2 CAPSULES AT BEDTIME, Disp: 270 capsule, Rfl: 3  Glucosamine-Chondroit-Vit C-Mn (GLUCOSAMINE CHONDR 1500 COMPLX PO), Take by mouth., Disp: , Rfl:    icosapent Ethyl (VASCEPA) 1 g capsule, TAKE (2) CAPSULES TWICE DAILY., Disp: 360 capsule, Rfl: 3   levothyroxine (SYNTHROID) 50 MCG tablet, Take 1/2 tablet on Mon, Wed, Fri and 1 tablet daily Tues and Thursday, Disp: 90 tablet, Rfl: 3   linaclotide (LINZESS) 72 MCG capsule, Take 1 capsule (72 mcg total) by mouth daily before breakfast. If no improvement, advance to 121mcg. If no improvement, advance to 257mcg, Disp: 90 capsule, Rfl: 3   losartan (COZAAR) 50 MG tablet, Take 1 tablet (50 mg total) by mouth daily., Disp: 90 tablet, Rfl: 3   meloxicam (MOBIC) 15 MG tablet, Take 1 tablet (15 mg total) by mouth  daily as needed for pain., Disp: 90 tablet, Rfl: 0   Multiple Vitamin (MULTI-VITAMIN PO), Take by mouth daily., Disp: , Rfl:    omeprazole (PRILOSEC) 20 MG capsule, TAKE 1 CAPSULE 2 TIMES A DAY BEFORE A MEAL, Disp: 180 capsule, Rfl: 3   Probiotic Product (PROBIOTIC DAILY PO), Take by mouth., Disp: , Rfl:   Current Facility-Administered Medications:    methylPREDNISolone acetate (DEPO-MEDROL) injection 40 mg, 40 mg, Intramuscular, Once, [name], DO Social History   Socioeconomic History   Marital status: Married    Spouse name: [name]"   Number of children: 1   Years of education: 16   Highest education level: Bachelor's degree (e.g., BA, AB, BS)  Occupational History   Occupation: Retired    Fish farm manager: Engineer, water    Comment: Teacher  Tobacco Use   Smoking status: Never   Smokeless tobacco: Never  Vaping Use   Vaping Use: Never used  Substance and Sexual Activity   Alcohol use: Yes    Alcohol/week: 7.0 standard drinks of alcohol    Types: 7 Glasses of wine per week    Comment: 1 daily   Drug use: No   Sexual activity: Never  Other Topics Concern   Not on file  Social History Narrative   Lives home with her husband "[name]" Their son lives in House. One grandchild   Social Determinants of Health   Financial Resource Strain: Low Risk  (01/29/2022)   Overall Financial Resource Strain (CARDIA)    Difficulty of Paying Living Expenses: Not hard at all  Food Insecurity: No Food Insecurity (01/29/2022)   Hunger Vital Sign    Worried About Running Out of Food in the Last Year: Never true    Ran Out of Food in the Last Year: Never true  Transportation Needs: No Transportation Needs (01/29/2022)   PRAPARE - Hydrologist (Medical): No    Lack of Transportation (Non-Medical): No  Physical Activity: Insufficiently Active (01/29/2022)   Exercise Vital Sign    Days of Exercise per Week: 7 days    Minutes of Exercise per Session: 20  min  Stress: No Stress Concern Present (01/29/2022)   [place]    Feeling of Stress : Only a little  Social Connections: Socially Integrated (01/29/2022)   Social Connection and Isolation Panel [NHANES]    Frequency of Communication with Friends and Family: More than three times a week    Frequency of Social Gatherings with Friends and Family: More than three times a week    Attends Religious Services: More than 4 times per year    Active Member of Clubs or Organizations: Yes    Attends  Club or Organization Meetings: More than 4 times per year    Marital Status: Married  Human resources officer Violence: Not At Risk (01/29/2022)   Humiliation, Afraid, Rape, and Kick questionnaire    Fear of Current or Ex-Partner: No    Emotionally Abused: No    Physically Abused: No    Sexually Abused: No   Family History  Problem Relation Age of Onset   Diabetes Father    Heart disease Father    Heart attack Father 26   Breast cancer Mother    Breast cancer Paternal Grandmother        Aunt also    Breast cancer Other        Family History   Breast cancer Maternal Aunt    Breast cancer Paternal Aunt    Colon cancer Neg Hx    Esophageal cancer Neg Hx    Rectal cancer Neg Hx    Stomach cancer Neg Hx     Objective: Office vital signs reviewed. BP (!) 155/63   Pulse 64   Temp 97.6 F (36.4 C)   Ht _0  (1.549 m)   Wt 135 lb 9.6 oz (61.5 kg)   SpO2 98%   BMI 25.62 kg/m   Physical Examination:  General: Awake, alert, well nourished, No acute distress HEENT: No exophthalmos.  No tremor. Cardio: regular rate and rhythm, S1S2 heard, no murmurs appreciated Pulm: clear to auscultation bilaterally, no wheezes, rhonchi or rales; normal work of breathing on room air MSK: Antalgic gait and station.  No midline tenderness palpation to lumbar spine.  No tenderness palpation over the trochanteric bursa on the right. Skin: dry; intact; no  rashes or lesions Neuro: 4/5 RLE Strength. bilateral lower extremity light touch sensation grossly intact.     Assessment/ Plan: 76 y.o. female   Aortic atherosclerosis ([place]) - Plan: CMP14+EGFR, Lipid Panel  Essential hypertension - Plan: CMP14+EGFR, Lipid Panel  Mixed hyperlipidemia - Plan: CMP14+EGFR, Lipid Panel  Hypothyroidism due to acquired atrophy of thyroid - Plan: TSH, T4, Free  Chronic right-sided low back pain with right-sided sciatica - Plan: methylPREDNISolone acetate (DEPO-MEDROL) injection 40 mg  Chronic idiopathic constipation - Plan: Plecanatide (TRULANCE) 3 MG TABS  Check fasting lipid, CMP.  Blood pressure not controlled even upon recheck but may be secondary to pain associated with right hip/low back.  Continue to monitor this closely with goal blood pressure less than 150/90  Asymptomatic from a thyroid standpoint except for chronic diarrhea.  I have given her samples of Trulance to try out if the Citrucel is not helpful in regulating her bowel movements  Depo-Medrol 40 mg IM administered for acute on chronic right-sided low back pain.  She has follow-up with orthopedics soon and I did ask her to consider perhaps delaying this shot as they may want to do some type of intra articular injection but she wished to proceed because she did not feel that she could go 2 weeks hobbling around in pain.   Orders Placed This Encounter  Procedures   TSH   T4, Free   CMP14+EGFR   Lipid Panel   Meds ordered this encounter  Medications   methylPREDNISolone acetate (DEPO-MEDROL) injection 40 mg     [name], DO [place] [phone]

## 2022-02-18 LAB — LIPID PANEL
Chol/HDL Ratio: 2.2 ratio (ref 0.0–4.4)
Cholesterol, Total: 156 mg/dL (ref 100–199)
HDL: 72 mg/dL (ref >39)
LDL Chol Calc (NIH): 65 mg/dL (ref 0–99)
Triglycerides: 108 mg/dL (ref 0–149)
VLDL Cholesterol Cal: 19 mg/dL (ref 5–40)

## 2022-02-18 LAB — CMP14+EGFR
ALT: 34 IU/L — ABNORMAL HIGH (ref 0–32)
AST: 33 IU/L (ref 0–40)
Albumin/Globulin Ratio: 2.3 — ABNORMAL HIGH (ref 1.2–2.2)
Albumin: 5 g/dL — ABNORMAL HIGH (ref 3.8–4.8)
Alkaline Phosphatase: 68 IU/L (ref 44–121)
BUN/Creatinine Ratio: 11 — ABNORMAL LOW (ref 12–28)
BUN: 7 mg/dL — ABNORMAL LOW (ref 8–27)
Bilirubin Total: 0.5 mg/dL (ref 0.0–1.2)
CO2: 22 mmol/L (ref 20–29)
Calcium: 9.5 mg/dL (ref 8.7–10.3)
Chloride: 93 mmol/L — ABNORMAL LOW (ref 96–106)
Creatinine, Ser: 0.61 mg/dL (ref 0.57–1.00)
Globulin, Total: 2.2 g/dL (ref 1.5–4.5)
Glucose: 88 mg/dL (ref 70–99)
Potassium: 4.7 mmol/L (ref 3.5–5.2)
Sodium: 131 mmol/L — ABNORMAL LOW (ref 134–144)
Total Protein: 7.2 g/dL (ref 6.0–8.5)
eGFR: 93 mL/min/{1.73_m2} (ref >59)

## 2022-02-18 LAB — TSH: TSH: 2.99 u[IU]/mL (ref 0.450–4.500)

## 2022-02-18 LAB — T4, FREE: Free T4: 1.15 ng/dL (ref 0.82–1.77)

## 2022-02-25 DIAGNOSIS — M9903 Segmental and somatic dysfunction of lumbar region: Secondary | ICD-10-CM | POA: Diagnosis not present

## 2022-02-25 DIAGNOSIS — M62830 Muscle spasm of back: Secondary | ICD-10-CM | POA: Diagnosis not present

## 2022-02-25 DIAGNOSIS — M9902 Segmental and somatic dysfunction of thoracic region: Secondary | ICD-10-CM | POA: Diagnosis not present

## 2022-02-25 DIAGNOSIS — M9901 Segmental and somatic dysfunction of cervical region: Secondary | ICD-10-CM | POA: Diagnosis not present

## 2022-02-26 DIAGNOSIS — M25551 Pain in right hip: Secondary | ICD-10-CM | POA: Diagnosis not present

## 2022-03-04 DIAGNOSIS — D1801 Hemangioma of skin and subcutaneous tissue: Secondary | ICD-10-CM | POA: Diagnosis not present

## 2022-03-04 DIAGNOSIS — D2271 Melanocytic nevi of right lower limb, including hip: Secondary | ICD-10-CM | POA: Diagnosis not present

## 2022-03-04 DIAGNOSIS — D225 Melanocytic nevi of trunk: Secondary | ICD-10-CM | POA: Diagnosis not present

## 2022-03-04 DIAGNOSIS — D2272 Melanocytic nevi of left lower limb, including hip: Secondary | ICD-10-CM | POA: Diagnosis not present

## 2022-03-04 DIAGNOSIS — D2262 Melanocytic nevi of left upper limb, including shoulder: Secondary | ICD-10-CM | POA: Diagnosis not present

## 2022-03-04 DIAGNOSIS — L430 Hypertrophic lichen planus: Secondary | ICD-10-CM | POA: Diagnosis not present

## 2022-03-04 DIAGNOSIS — D2261 Melanocytic nevi of right upper limb, including shoulder: Secondary | ICD-10-CM | POA: Diagnosis not present

## 2022-03-04 DIAGNOSIS — L821 Other seborrheic keratosis: Secondary | ICD-10-CM | POA: Diagnosis not present

## 2022-03-04 DIAGNOSIS — D485 Neoplasm of uncertain behavior of skin: Secondary | ICD-10-CM | POA: Diagnosis not present

## 2022-03-04 DIAGNOSIS — D2371 Other benign neoplasm of skin of right lower limb, including hip: Secondary | ICD-10-CM | POA: Diagnosis not present

## 2022-03-10 DIAGNOSIS — M9902 Segmental and somatic dysfunction of thoracic region: Secondary | ICD-10-CM | POA: Diagnosis not present

## 2022-03-10 DIAGNOSIS — M9901 Segmental and somatic dysfunction of cervical region: Secondary | ICD-10-CM | POA: Diagnosis not present

## 2022-03-10 DIAGNOSIS — M62830 Muscle spasm of back: Secondary | ICD-10-CM | POA: Diagnosis not present

## 2022-03-10 DIAGNOSIS — M9903 Segmental and somatic dysfunction of lumbar region: Secondary | ICD-10-CM | POA: Diagnosis not present

## 2022-03-17 DIAGNOSIS — M25551 Pain in right hip: Secondary | ICD-10-CM | POA: Diagnosis not present

## 2022-03-31 ENCOUNTER — Other Ambulatory Visit: Payer: Self-pay | Admitting: Family Medicine

## 2022-03-31 DIAGNOSIS — Z1231 Encounter for screening mammogram for malignant neoplasm of breast: Secondary | ICD-10-CM

## 2022-03-31 DIAGNOSIS — M47816 Spondylosis without myelopathy or radiculopathy, lumbar region: Secondary | ICD-10-CM | POA: Diagnosis not present

## 2022-04-01 DIAGNOSIS — M9901 Segmental and somatic dysfunction of cervical region: Secondary | ICD-10-CM | POA: Diagnosis not present

## 2022-04-01 DIAGNOSIS — M62830 Muscle spasm of back: Secondary | ICD-10-CM | POA: Diagnosis not present

## 2022-04-01 DIAGNOSIS — M9902 Segmental and somatic dysfunction of thoracic region: Secondary | ICD-10-CM | POA: Diagnosis not present

## 2022-04-01 DIAGNOSIS — M9903 Segmental and somatic dysfunction of lumbar region: Secondary | ICD-10-CM | POA: Diagnosis not present

## 2022-04-06 ENCOUNTER — Other Ambulatory Visit: Payer: Self-pay | Admitting: Family Medicine

## 2022-04-14 DIAGNOSIS — M9903 Segmental and somatic dysfunction of lumbar region: Secondary | ICD-10-CM | POA: Diagnosis not present

## 2022-04-14 DIAGNOSIS — M9902 Segmental and somatic dysfunction of thoracic region: Secondary | ICD-10-CM | POA: Diagnosis not present

## 2022-04-14 DIAGNOSIS — M62830 Muscle spasm of back: Secondary | ICD-10-CM | POA: Diagnosis not present

## 2022-04-14 DIAGNOSIS — M9901 Segmental and somatic dysfunction of cervical region: Secondary | ICD-10-CM | POA: Diagnosis not present

## 2022-04-27 DIAGNOSIS — M47816 Spondylosis without myelopathy or radiculopathy, lumbar region: Secondary | ICD-10-CM | POA: Diagnosis not present

## 2022-04-30 ENCOUNTER — Encounter: Payer: Self-pay | Admitting: Family Medicine

## 2022-05-05 DIAGNOSIS — M62830 Muscle spasm of back: Secondary | ICD-10-CM | POA: Diagnosis not present

## 2022-05-05 DIAGNOSIS — M9902 Segmental and somatic dysfunction of thoracic region: Secondary | ICD-10-CM | POA: Diagnosis not present

## 2022-05-05 DIAGNOSIS — M9903 Segmental and somatic dysfunction of lumbar region: Secondary | ICD-10-CM | POA: Diagnosis not present

## 2022-05-05 DIAGNOSIS — M9901 Segmental and somatic dysfunction of cervical region: Secondary | ICD-10-CM | POA: Diagnosis not present

## 2022-05-06 DIAGNOSIS — Z1231 Encounter for screening mammogram for malignant neoplasm of breast: Secondary | ICD-10-CM

## 2022-05-13 DIAGNOSIS — M47816 Spondylosis without myelopathy or radiculopathy, lumbar region: Secondary | ICD-10-CM | POA: Diagnosis not present

## 2022-05-22 ENCOUNTER — Encounter: Payer: Self-pay | Admitting: Family Medicine

## 2022-05-25 ENCOUNTER — Encounter: Payer: Self-pay | Admitting: Family Medicine

## 2022-05-26 DIAGNOSIS — M62830 Muscle spasm of back: Secondary | ICD-10-CM | POA: Diagnosis not present

## 2022-05-26 DIAGNOSIS — M9901 Segmental and somatic dysfunction of cervical region: Secondary | ICD-10-CM | POA: Diagnosis not present

## 2022-05-26 DIAGNOSIS — M9903 Segmental and somatic dysfunction of lumbar region: Secondary | ICD-10-CM | POA: Diagnosis not present

## 2022-05-26 DIAGNOSIS — M9902 Segmental and somatic dysfunction of thoracic region: Secondary | ICD-10-CM | POA: Diagnosis not present

## 2022-05-28 ENCOUNTER — Ambulatory Visit
Admission: RE | Admit: 2022-05-28 | Discharge: 2022-05-28 | Disposition: A | Discharge status: Home / Self Care | Payer: Medicare PPO | Source: Ambulatory Visit | Attending: Family Medicine | Admitting: Family Medicine

## 2022-05-28 DIAGNOSIS — Z1231 Encounter for screening mammogram for malignant neoplasm of breast: Secondary | ICD-10-CM

## 2022-06-04 DIAGNOSIS — M47816 Spondylosis without myelopathy or radiculopathy, lumbar region: Secondary | ICD-10-CM | POA: Diagnosis not present

## 2022-06-09 DIAGNOSIS — M9903 Segmental and somatic dysfunction of lumbar region: Secondary | ICD-10-CM | POA: Diagnosis not present

## 2022-06-09 DIAGNOSIS — M9902 Segmental and somatic dysfunction of thoracic region: Secondary | ICD-10-CM | POA: Diagnosis not present

## 2022-06-09 DIAGNOSIS — M9901 Segmental and somatic dysfunction of cervical region: Secondary | ICD-10-CM | POA: Diagnosis not present

## 2022-06-09 DIAGNOSIS — M62830 Muscle spasm of back: Secondary | ICD-10-CM | POA: Diagnosis not present

## 2022-06-10 ENCOUNTER — Ambulatory Visit: Payer: Medicare PPO

## 2022-06-10 ENCOUNTER — Ambulatory Visit: Payer: Medicare PPO | Admitting: Family

## 2022-06-10 ENCOUNTER — Encounter: Payer: Self-pay | Admitting: Family

## 2022-06-10 VITALS — BP 162/73 | HR 61 | Temp 97.6°F | Ht 61.0 in | Wt 132.2 lb

## 2022-06-10 DIAGNOSIS — M5136 Other intervertebral disc degeneration, lumbar region: Secondary | ICD-10-CM | POA: Diagnosis not present

## 2022-06-10 DIAGNOSIS — I10 Essential (primary) hypertension: Secondary | ICD-10-CM | POA: Diagnosis not present

## 2022-06-10 DIAGNOSIS — F411 Generalized anxiety disorder: Secondary | ICD-10-CM

## 2022-06-10 MED ORDER — LOSARTAN POTASSIUM 100 MG PO TABS: 100.0000 mg | ORAL_TABLET | Freq: Every day | ORAL | 1 refills | Status: DC

## 2022-06-10 NOTE — Patient Instructions (Signed)
Hypertension, Adult High blood pressure (hypertension) is when the force of blood pumping through the arteries is too strong. The arteries are the blood vessels that carry blood from the heart throughout the body. Hypertension forces the heart to work harder to pump blood and may cause arteries to become narrow or stiff. Untreated or uncontrolled hypertension can lead to a heart attack, heart failure, a stroke, kidney disease, and other problems. A blood pressure reading consists of a higher number over a lower number. Ideally, your blood pressure should be below 120/80. The first ("top") number is called the systolic pressure. It is a measure of the pressure in your arteries as your heart beats. The second ("bottom") number is called the diastolic pressure. It is a measure of the pressure in your arteries as the heart relaxes. What are the causes? The exact cause of this condition is not known. There are some conditions that result in high blood pressure. What increases the risk? Certain factors may make you more likely to develop high blood pressure. Some of these risk factors are under your control, including: Smoking. Not getting enough exercise or physical activity. Being overweight. Having too much fat, sugar, calories, or salt (sodium) in your diet. Drinking too much alcohol. Other risk factors include: Having a personal history of heart disease, diabetes, high cholesterol, or kidney disease. Stress. Having a family history of high blood pressure and high cholesterol. Having obstructive sleep apnea. Age. The risk increases with age. What are the signs or symptoms? High blood pressure may not cause symptoms. Very high blood pressure (hypertensive crisis) may cause: Headache. Fast or irregular heartbeats (palpitations). Shortness of breath. Nosebleed. Nausea and vomiting. Vision changes. Severe chest pain, dizziness, and seizures. How is this diagnosed? This condition is diagnosed by  measuring your blood pressure while you are seated, with your arm resting on a flat surface, your legs uncrossed, and your feet flat on the floor. The cuff of the blood pressure monitor will be placed directly against the skin of your upper arm at the level of your heart. Blood pressure should be measured at least twice using the same arm. Certain conditions can cause a difference in blood pressure between your right and left arms. If you have a high blood pressure reading during one visit or you have normal blood pressure with other risk factors, you may be asked to: Return on a different day to have your blood pressure checked again. Monitor your blood pressure at home for 1 week or longer. If you are diagnosed with hypertension, you may have other blood or imaging tests to help your health care provider understand your overall risk for other conditions. How is this treated? This condition is treated by making healthy lifestyle changes, such as eating healthy foods, exercising more, and reducing your alcohol intake. You may be referred for counseling on a healthy diet and physical activity. Your health care provider may prescribe medicine if lifestyle changes are not enough to get your blood pressure under control and if: Your systolic blood pressure is above 130. Your diastolic blood pressure is above 80. Your personal target blood pressure may vary depending on your medical conditions, your age, and other factors. Follow these instructions at home: Eating and drinking  Eat a diet that is high in fiber and potassium, and low in sodium, added sugar, and fat. An example of this eating plan is called the DASH diet. DASH stands for Dietary Approaches to Stop Hypertension. To eat this way: Eat   plenty of fresh fruits and vegetables. Try to fill one half of your plate at each meal with fruits and vegetables. Eat whole grains, such as whole-wheat pasta, brown rice, or whole-grain bread. Fill about one  fourth of your plate with whole grains. Eat or drink low-fat dairy products, such as skim milk or low-fat yogurt. Avoid fatty cuts of meat, processed or cured meats, and poultry with skin. Fill about one fourth of your plate with lean proteins, such as fish, chicken without skin, beans, eggs, or tofu. Avoid pre-made and processed foods. These tend to be higher in sodium, added sugar, and fat. Reduce your daily sodium intake. Many people with hypertension should eat less than 1,500 mg of sodium a day. Do not drink alcohol if: Your health care provider tells you not to drink. You are pregnant, may be pregnant, or are planning to become pregnant. If you drink alcohol: Limit how much you have to: 0-1 drink a day for women. 0-2 drinks a day for men. Know how much alcohol is in your drink. In the [place] one drink equals one 12 oz bottle of beer (355 mL), one 5 oz glass of wine (148 mL), or one 1 oz glass of hard liquor (44 mL). Lifestyle  Work with your health care provider to maintain a healthy body weight or to lose weight. Ask what an ideal weight is for you. Get at least 30 minutes of exercise that causes your heart to beat faster (aerobic exercise) most days of the week. Activities may include walking, swimming, or biking. Include exercise to strengthen your muscles (resistance exercise), such as Pilates or lifting weights, as part of your weekly exercise routine. Try to do these types of exercises for 30 minutes at least 3 days a week. Do not use any products that contain nicotine or tobacco. These products include cigarettes, chewing tobacco, and vaping devices, such as e-cigarettes. If you need help quitting, ask your health care provider. Monitor your blood pressure at home as told by your health care provider. Keep all follow-up visits. This is important. Medicines Take over-the-counter and prescription medicines only as told by your health care provider. Follow directions carefully. Blood  pressure medicines must be taken as prescribed. Do not skip doses of blood pressure medicine. Doing this puts you at risk for problems and can make the medicine less effective. Ask your health care provider about side effects or reactions to medicines that you should watch for. Contact a health care provider if you: Think you are having a reaction to a medicine you are taking. Have headaches that keep coming back (recurring). Feel dizzy. Have swelling in your ankles. Have trouble with your vision. Get help right away if you: Develop a severe headache or confusion. Have unusual weakness or numbness. Feel faint. Have severe pain in your chest or abdomen. Vomit repeatedly. Have trouble breathing. These symptoms may be an emergency. Get help right away. Call 911. Do not wait to see if the symptoms will go away. Do not drive yourself to the hospital. Summary Hypertension is when the force of blood pumping through your arteries is too strong. If this condition is not controlled, it may put you at risk for serious complications. Your personal target blood pressure may vary depending on your medical conditions, your age, and other factors. For most people, a normal blood pressure is less than 120/80. Hypertension is treated with lifestyle changes, medicines, or a combination of both. Lifestyle changes include losing weight, eating a healthy,   low-sodium diet, exercising more, and limiting alcohol. This information is not intended to replace advice given to you by your health care provider. Make sure you discuss any questions you have with your health care provider. Document Revised: 05/26/2021 Document Reviewed: 05/26/2021 Elsevier Patient Education  2023 Elsevier Inc.  

## 2022-06-10 NOTE — Progress Notes (Signed)
Subjective:    Patient ID: Marissa Perez, female    DOB: 09/06/1945, 76 y.o.   MRN: [id number]  Chief Complaint  Patient presents with   Hypertension   Pt presents to the office today with elevated BP. She reports she has been taking her BP at home and her BP has been 157/72. She is currently taking losartan 50 mg daily.   She reports her anxiety has been increased. She also reports increased lumbar back pain. She is followed by Ortho.  Hypertension This is a chronic problem. The current episode started more than 1 year ago. The problem has been waxing and waning since onset. The problem is uncontrolled. Associated symptoms include anxiety and malaise/fatigue. Pertinent negatives include no headaches, peripheral edema or shortness of breath. Risk factors for coronary artery disease include dyslipidemia, obesity and sedentary lifestyle. Past treatments include angiotensin blockers. The current treatment provides no improvement.  Anxiety Presents for follow-up visit. Symptoms include excessive worry, irritability, nervous/anxious behavior and restlessness. Patient reports no depressed mood or shortness of breath. Symptoms occur most days. The severity of symptoms is moderate.    Back Pain This is a chronic problem. The current episode started more than 1 year ago. The problem occurs intermittently. The problem has been waxing and waning since onset. The pain is present in the lumbar spine. The quality of the pain is described as aching. The pain is at a severity of 4/10. The pain is moderate. The symptoms are aggravated by bending and twisting. Pertinent negatives include no headaches. Risk factors include obesity. The treatment provided mild relief.      Review of Systems  Constitutional:  Positive for irritability and malaise/fatigue.  Respiratory:  Negative for shortness of breath.   Musculoskeletal:  Positive for back pain.  Neurological:  Negative for headaches.   Psychiatric/Behavioral:  The patient is nervous/anxious.   All other systems reviewed and are negative.      Objective:   Physical Exam Vitals reviewed.  Constitutional:      General: She is not in acute distress.    Appearance: She is well-developed.  HENT:     Head: Normocephalic and atraumatic.     Right Ear: Tympanic membrane normal.     Left Ear: Tympanic membrane normal.  Eyes:     Pupils: Pupils are equal, round, and reactive to light.  Neck:     Thyroid: No thyromegaly.  Cardiovascular:     Rate and Rhythm: Normal rate and regular rhythm.     Heart sounds: Normal heart sounds. No murmur heard. Pulmonary:     Effort: Pulmonary effort is normal. No respiratory distress.     Breath sounds: Normal breath sounds. No wheezing.  Abdominal:     General: Bowel sounds are normal. There is no distension.     Palpations: Abdomen is soft.     Tenderness: There is no abdominal tenderness.  Musculoskeletal:        General: No tenderness. Normal range of motion.     Cervical back: Normal range of motion and neck supple.  Skin:    General: Skin is warm and dry.  Neurological:     Mental Status: She is alert and oriented to person, place, and time.     Cranial Nerves: No cranial nerve deficit.     Deep Tendon Reflexes: Reflexes are normal and symmetric.  Psychiatric:        Behavior: Behavior normal.        Thought Content: Thought content  normal.        Judgment: Judgment normal.       BP (!) 162/73   Pulse 61   Temp 97.6 F (36.4 C) (Temporal)   Ht '5\' 1"'$  (1.549 m)   Wt 132 lb 3.2 oz (60 kg)   SpO2 99%   BMI 24.98 kg/m      Assessment & Plan:  Marissa Perez comes in today with chief complaint of Hypertension   Diagnosis and orders addressed:  1. Degeneration of lumbar intervertebral disc Keep Follow with Ortho   2. Primary hypertension Will increase losartan to 100 mg from 50 mg  -Dash diet information given -Exercise encouraged - Stress Management   -Continue current meds -RTO in 2-4 weeks  - losartan (COZAAR) 100 MG tablet; Take 1 tablet (100 mg total) by mouth daily.  Dispense: 90 tablet; Refill: 1  3. GAD (generalized anxiety disorder) Stress management    Follow up plan: 2-4 weeks    Evelina Dun, FNP

## 2022-06-21 DIAGNOSIS — M62830 Muscle spasm of back: Secondary | ICD-10-CM | POA: Diagnosis not present

## 2022-06-21 DIAGNOSIS — M5136 Other intervertebral disc degeneration, lumbar region: Secondary | ICD-10-CM | POA: Diagnosis not present

## 2022-06-21 DIAGNOSIS — M9903 Segmental and somatic dysfunction of lumbar region: Secondary | ICD-10-CM | POA: Diagnosis not present

## 2022-06-21 DIAGNOSIS — M47816 Spondylosis without myelopathy or radiculopathy, lumbar region: Secondary | ICD-10-CM | POA: Diagnosis not present

## 2022-06-21 DIAGNOSIS — M9901 Segmental and somatic dysfunction of cervical region: Secondary | ICD-10-CM | POA: Diagnosis not present

## 2022-06-21 DIAGNOSIS — M9902 Segmental and somatic dysfunction of thoracic region: Secondary | ICD-10-CM | POA: Diagnosis not present

## 2022-06-22 ENCOUNTER — Encounter: Payer: Self-pay | Admitting: Family Medicine

## 2022-07-05 ENCOUNTER — Ambulatory Visit: Payer: Medicare PPO | Admitting: Family

## 2022-07-05 ENCOUNTER — Encounter: Payer: Self-pay | Admitting: Family

## 2022-07-05 ENCOUNTER — Other Ambulatory Visit: Payer: Self-pay | Admitting: Family Medicine

## 2022-07-05 VITALS — BP 130/58 | HR 53 | Temp 97.8°F | Ht 61.0 in | Wt 133.0 lb

## 2022-07-05 DIAGNOSIS — I10 Essential (primary) hypertension: Secondary | ICD-10-CM

## 2022-07-05 NOTE — Progress Notes (Signed)
   Subjective:    Patient ID: Marissa Perez, female    DOB: 31-May-1946, 76 y.o.   MRN: [id number]  Chief Complaint  Patient presents with   Hypertension   PT presents to the office today recheck HTN. We increased her losartan to 100 mg from 50 mg. Her BP has improved. Hypertension This is a chronic problem. The current episode started more than 1 year ago. The problem has been waxing and waning since onset. The problem is controlled. Pertinent negatives include no malaise/fatigue, peripheral edema or shortness of breath. Risk factors for coronary artery disease include dyslipidemia and sedentary lifestyle. Past treatments include angiotensin blockers. The current treatment provides moderate improvement.      Review of Systems  Constitutional:  Negative for malaise/fatigue.  Respiratory:  Negative for shortness of breath.   All other systems reviewed and are negative.      Objective:   Physical Exam Vitals reviewed.  Constitutional:      General: She is not in acute distress.    Appearance: She is well-developed.  HENT:     Head: Normocephalic and atraumatic.  Eyes:     Pupils: Pupils are equal, round, and reactive to light.  Neck:     Thyroid: No thyromegaly.  Cardiovascular:     Rate and Rhythm: Normal rate and regular rhythm.     Heart sounds: Normal heart sounds. No murmur heard. Pulmonary:     Effort: Pulmonary effort is normal. No respiratory distress.     Breath sounds: Normal breath sounds. No wheezing.  Abdominal:     General: Bowel sounds are normal. There is no distension.     Palpations: Abdomen is soft.     Tenderness: There is no abdominal tenderness.  Musculoskeletal:        General: No tenderness. Normal range of motion.     Cervical back: Normal range of motion and neck supple.  Skin:    General: Skin is warm and dry.  Neurological:     Mental Status: She is alert and oriented to person, place, and time.     Cranial Nerves: No cranial nerve deficit.      Deep Tendon Reflexes: Reflexes are normal and symmetric.  Psychiatric:        Behavior: Behavior normal.        Thought Content: Thought content normal.        Judgment: Judgment normal.       BP (!) 143/67   Pulse (!) 53   Temp 97.8 F (36.6 C) (Temporal)   Ht _0  (1.549 m)   Wt 133 lb (60.3 kg)   SpO2 100%   BMI 25.13 kg/m      Assessment & Plan:   Marissa Perez comes in today with chief complaint of Hypertension   Diagnosis and orders addressed:  1. Primary hypertension -Dash diet information given -Exercise encouraged - Stress Management  -Continue current meds -Keep follow up with PCP - BMP8+EGFR   Evelina Dun, FNP

## 2022-07-05 NOTE — Patient Instructions (Signed)
Hypertension, Adult High blood pressure (hypertension) is when the force of blood pumping through the arteries is too strong. The arteries are the blood vessels that carry blood from the heart throughout the body. Hypertension forces the heart to work harder to pump blood and may cause arteries to become narrow or stiff. Untreated or uncontrolled hypertension can lead to a heart attack, heart failure, a stroke, kidney disease, and other problems. A blood pressure reading consists of a higher number over a lower number. Ideally, your blood pressure should be below 120/80. The first ("top") number is called the systolic pressure. It is a measure of the pressure in your arteries as your heart beats. The second ("bottom") number is called the diastolic pressure. It is a measure of the pressure in your arteries as the heart relaxes. What are the causes? The exact cause of this condition is not known. There are some conditions that result in high blood pressure. What increases the risk? Certain factors may make you more likely to develop high blood pressure. Some of these risk factors are under your control, including: Smoking. Not getting enough exercise or physical activity. Being overweight. Having too much fat, sugar, calories, or salt (sodium) in your diet. Drinking too much alcohol. Other risk factors include: Having a personal history of heart disease, diabetes, high cholesterol, or kidney disease. Stress. Having a family history of high blood pressure and high cholesterol. Having obstructive sleep apnea. Age. The risk increases with age. What are the signs or symptoms? High blood pressure may not cause symptoms. Very high blood pressure (hypertensive crisis) may cause: Headache. Fast or irregular heartbeats (palpitations). Shortness of breath. Nosebleed. Nausea and vomiting. Vision changes. Severe chest pain, dizziness, and seizures. How is this diagnosed? This condition is diagnosed by  measuring your blood pressure while you are seated, with your arm resting on a flat surface, your legs uncrossed, and your feet flat on the floor. The cuff of the blood pressure monitor will be placed directly against the skin of your upper arm at the level of your heart. Blood pressure should be measured at least twice using the same arm. Certain conditions can cause a difference in blood pressure between your right and left arms. If you have a high blood pressure reading during one visit or you have normal blood pressure with other risk factors, you may be asked to: Return on a different day to have your blood pressure checked again. Monitor your blood pressure at home for 1 week or longer. If you are diagnosed with hypertension, you may have other blood or imaging tests to help your health care provider understand your overall risk for other conditions. How is this treated? This condition is treated by making healthy lifestyle changes, such as eating healthy foods, exercising more, and reducing your alcohol intake. You may be referred for counseling on a healthy diet and physical activity. Your health care provider may prescribe medicine if lifestyle changes are not enough to get your blood pressure under control and if: Your systolic blood pressure is above 130. Your diastolic blood pressure is above 80. Your personal target blood pressure may vary depending on your medical conditions, your age, and other factors. Follow these instructions at home: Eating and drinking  Eat a diet that is high in fiber and potassium, and low in sodium, added sugar, and fat. An example of this eating plan is called the DASH diet. DASH stands for Dietary Approaches to Stop Hypertension. To eat this way: Eat   plenty of fresh fruits and vegetables. Try to fill one half of your plate at each meal with fruits and vegetables. Eat whole grains, such as whole-wheat pasta, brown rice, or whole-grain bread. Fill about one  fourth of your plate with whole grains. Eat or drink low-fat dairy products, such as skim milk or low-fat yogurt. Avoid fatty cuts of meat, processed or cured meats, and poultry with skin. Fill about one fourth of your plate with lean proteins, such as fish, chicken without skin, beans, eggs, or tofu. Avoid pre-made and processed foods. These tend to be higher in sodium, added sugar, and fat. Reduce your daily sodium intake. Many people with hypertension should eat less than 1,500 mg of sodium a day. Do not drink alcohol if: Your health care provider tells you not to drink. You are pregnant, may be pregnant, or are planning to become pregnant. If you drink alcohol: Limit how much you have to: 0-1 drink a day for women. 0-2 drinks a day for men. Know how much alcohol is in your drink. In the [place] one drink equals one 12 oz bottle of beer (355 mL), one 5 oz glass of wine (148 mL), or one 1 oz glass of hard liquor (44 mL). Lifestyle  Work with your health care provider to maintain a healthy body weight or to lose weight. Ask what an ideal weight is for you. Get at least 30 minutes of exercise that causes your heart to beat faster (aerobic exercise) most days of the week. Activities may include walking, swimming, or biking. Include exercise to strengthen your muscles (resistance exercise), such as Pilates or lifting weights, as part of your weekly exercise routine. Try to do these types of exercises for 30 minutes at least 3 days a week. Do not use any products that contain nicotine or tobacco. These products include cigarettes, chewing tobacco, and vaping devices, such as e-cigarettes. If you need help quitting, ask your health care provider. Monitor your blood pressure at home as told by your health care provider. Keep all follow-up visits. This is important. Medicines Take over-the-counter and prescription medicines only as told by your health care provider. Follow directions carefully. Blood  pressure medicines must be taken as prescribed. Do not skip doses of blood pressure medicine. Doing this puts you at risk for problems and can make the medicine less effective. Ask your health care provider about side effects or reactions to medicines that you should watch for. Contact a health care provider if you: Think you are having a reaction to a medicine you are taking. Have headaches that keep coming back (recurring). Feel dizzy. Have swelling in your ankles. Have trouble with your vision. Get help right away if you: Develop a severe headache or confusion. Have unusual weakness or numbness. Feel faint. Have severe pain in your chest or abdomen. Vomit repeatedly. Have trouble breathing. These symptoms may be an emergency. Get help right away. Call 911. Do not wait to see if the symptoms will go away. Do not drive yourself to the hospital. Summary Hypertension is when the force of blood pumping through your arteries is too strong. If this condition is not controlled, it may put you at risk for serious complications. Your personal target blood pressure may vary depending on your medical conditions, your age, and other factors. For most people, a normal blood pressure is less than 120/80. Hypertension is treated with lifestyle changes, medicines, or a combination of both. Lifestyle changes include losing weight, eating a healthy,   low-sodium diet, exercising more, and limiting alcohol. This information is not intended to replace advice given to you by your health care provider. Make sure you discuss any questions you have with your health care provider. Document Revised: 05/26/2021 Document Reviewed: 05/26/2021 Elsevier Patient Education  2023 Elsevier Inc.  

## 2022-07-06 LAB — BMP8+EGFR
BUN/Creatinine Ratio: 21 (ref 12–28)
BUN: 14 mg/dL (ref 8–27)
CO2: 22 mmol/L (ref 20–29)
Calcium: 9.7 mg/dL (ref 8.7–10.3)
Chloride: 102 mmol/L (ref 96–106)
Creatinine, Ser: 0.67 mg/dL (ref 0.57–1.00)
Glucose: 93 mg/dL (ref 70–99)
Potassium: 5.2 mmol/L (ref 3.5–5.2)
Sodium: 141 mmol/L (ref 134–144)
eGFR: 91 mL/min/{1.73_m2} (ref >59)

## 2022-07-08 DIAGNOSIS — M9902 Segmental and somatic dysfunction of thoracic region: Secondary | ICD-10-CM | POA: Diagnosis not present

## 2022-07-08 DIAGNOSIS — M9901 Segmental and somatic dysfunction of cervical region: Secondary | ICD-10-CM | POA: Diagnosis not present

## 2022-07-08 DIAGNOSIS — M9903 Segmental and somatic dysfunction of lumbar region: Secondary | ICD-10-CM | POA: Diagnosis not present

## 2022-07-08 DIAGNOSIS — M62830 Muscle spasm of back: Secondary | ICD-10-CM | POA: Diagnosis not present

## 2022-07-15 DIAGNOSIS — M5416 Radiculopathy, lumbar region: Secondary | ICD-10-CM | POA: Diagnosis not present

## 2022-07-15 DIAGNOSIS — M5136 Other intervertebral disc degeneration, lumbar region: Secondary | ICD-10-CM | POA: Diagnosis not present

## 2022-07-15 DIAGNOSIS — M47816 Spondylosis without myelopathy or radiculopathy, lumbar region: Secondary | ICD-10-CM | POA: Diagnosis not present

## 2022-07-15 DIAGNOSIS — K219 Gastro-esophageal reflux disease without esophagitis: Secondary | ICD-10-CM | POA: Diagnosis not present

## 2022-07-21 ENCOUNTER — Other Ambulatory Visit: Payer: Self-pay

## 2022-07-21 ENCOUNTER — Encounter: Payer: Self-pay | Admitting: Physical Therapy

## 2022-07-21 ENCOUNTER — Ambulatory Visit: Payer: Medicare PPO | Attending: Family Medicine | Admitting: Physical Therapy

## 2022-07-21 DIAGNOSIS — M62830 Muscle spasm of back: Secondary | ICD-10-CM | POA: Diagnosis not present

## 2022-07-21 DIAGNOSIS — M5459 Other low back pain: Secondary | ICD-10-CM | POA: Diagnosis not present

## 2022-07-21 DIAGNOSIS — R293 Abnormal posture: Secondary | ICD-10-CM | POA: Insufficient documentation

## 2022-07-21 NOTE — Therapy (Addendum)
OUTPATIENT PHYSICAL THERAPY THORACOLUMBAR EVALUATION   Patient Name: Marissa Perez MRN: [id number] DOB:12-30-1945, 76 y.o., female Today's Date: 07/21/2022  END OF SESSION:  PT End of Session - 07/21/22 1230     Visit Number 1    Number of Visits 12    Date for PT Re-Evaluation 08/18/22    Authorization Type FOTO AT LEAST EVERY 5TH VISIT.  PROGRESS NOTE AT 10TH VISIT.  KX MODIFIER AFTER 15 VISITS.    PT Start Time 1030    PT Stop Time 1122    PT Time Calculation (min) 52 min    Activity Tolerance Patient tolerated treatment well    Behavior During Therapy WFL for tasks assessed/performed             Past Medical History:  Diagnosis Date   Acid reflux    Allergy    Arthritis    Diverticulosis    GERD (gastroesophageal reflux disease)    History of ETT 7/10   abnormal    History of stomach ulcers 2008   positive h. pylori   Hyperlipidemia    Hypertension    Hypothyroidism    IBS (irritable bowel syndrome)    Interstitial cystitis    Skin cancer    basal and squamous   Past Surgical History:  Procedure Laterality Date   CESAREAN SECTION  1972   MOHS SURGERY     TONSILLECTOMY  1951   TOTAL ABDOMINAL HYSTERECTOMY W/ BILATERAL SALPINGOOPHORECTOMY  1990   Dr. Tamala Julian / fibroids    Patient Active Problem List   Diagnosis Date Noted   Osteoarthritis of first carpometacarpal joint of left hand 01/15/2022   Osteoarthritis of first carpometacarpal joint of right hand 01/15/2022   Pain in right knee 08/22/2019   Osteoarthritis of multiple joints 01/25/2019   Lumbar spondylosis 12/28/2017   Degeneration of lumbar intervertebral disc 12/13/2017   Aortic atherosclerosis ([place]) 09/22/2017   Family history of breast cancer 07/05/2016   Skin cancer 07/05/2016   Vitamin D deficiency 07/05/2016   Hypothyroidism 12/11/2012   Hyperlipidemia 12/11/2012   Hypertension 12/11/2012   GERD (gastroesophageal reflux disease) 12/11/2012   REFERRING PROVIDER: Benedetto Goad  NP  REFERRING DIAG: Lumbar spondylosis with myelopathy  Rationale for Evaluation and Treatment: Rehabilitation  THERAPY DIAG:  Other low back pain  Abnormal posture  Muscle spasm of back  ONSET DATE: Ongoing.  SUBJECTIVE:                                                                                                                                                                                           SUBJECTIVE STATEMENT: The patient presents  to the clinic with chronic low back pain with referral into right buttock region.  Her pain has worsened over the last 6-8 months.  She had an RFA procedure on 06/04/22 was helpful.  Her pain is about a 3 today but can rise to a 6-7/10 with prolonged standing, walking long distances and bending.  Her pain is described as an ache, sharp and stabbing.  PERTINENT HISTORY:  H/o LBP, HTN hypothyroidism, OP.  PAIN:  Are you having pain? Yes: NPRS scale: 3/10 Pain location: Right low back. Pain description: As above. Aggravating factors: As above. Relieving factors: Ice sometimes.  PRECAUTIONS: OP  WEIGHT BEARING RESTRICTIONS: No  FALLS:  Has patient fallen in last 6 months? No  LIVING ENVIRONMENT: Lives with: lives with their spouse Lives in: House/apartment Has following equipment at home: None  OCCUPATION: Retired.  PLOF: Independent  PATIENT GOALS: Not have pain like she is having and be able to do more.  NEXT MD VISIT:   OBJECTIVE:  POSTURE: posterior pelvic tilt, decreased lumbar lordosis.  PALPATION: Tender to palpation over right SIJ and upper gluteal region.  LUMBAR ROM:   Active lumbar flexion demonstrates a 25% deficit and extension is limited to 10 degrees.   LOWER EXTREMITY MMT:    Normal LE strength.  LUMBAR SPECIAL TESTS:  SLR test (-), equal leg lengths, (+) FABER on right, (-) RT hip Scour test.  LE DTR's 1+/4+.  GAIT: The patient walks in some spinal flexion.  TODAY'S TREATMENT:                                                                                                                               DATE: IFC at 80-150 Hz on 40% scan x 20 minutes to patient's right SIJ region.  Patient tolerated treatment without complaint with normal modality response following removal of modality.    ASSESSMENT:  CLINICAL IMPRESSION: The patient presents to [facility]PP[facility] with c/o chronic right-sided low back pain that has worsened over the last 6-8 months.  She will have pain going into the right buttock.  Her posture is remarkable for a posterior pelvic tilt and decreased lumbar lordosis.  She is tender to palpation over her right SIJ and upper gluteal musculature.  Prolonged standing and especially increase her pain.  She demonstrates a (+) right FABER test.  Her pain has negatively affected her ability to perform ADL's.  Patient will benefit from skilled physical therapy intervention to address pain and deficits.  OBJECTIVE IMPAIRMENTS: decreased activity tolerance, decreased ROM, increased muscle spasms, postural dysfunction, and pain.   ACTIVITY LIMITATIONS: carrying, lifting, bending, standing, locomotion level, and caring for others  PARTICIPATION LIMITATIONS: laundry  PERSONAL FACTORS: Time since onset of injury/illness/exacerbation are also affecting patient's functional outcome.   REHAB POTENTIAL: Good  CLINICAL DECISION MAKING: Stable/uncomplicated  EVALUATION COMPLEXITY: Low   GOALS:  LONG TERM GOALS: Target date: 08/18/22  Ind with a HEP. Goal status: INITIAL  2.  Perform ADL's with pain not > 3/10. Goal status: INITIAL  3.  Stand 20 minutes with pain not > 3/10. Goal status: INITIAL  4.  Walk a community distance with pain not > 3/10. Goal status: INITIAL  PLAN:  PT FREQUENCY: 3x/week  PT DURATION: 4 weeks  PLANNED INTERVENTIONS: Therapeutic exercises, Therapeutic activity, Patient/Family education, Self Care, Dry Needling, Electrical stimulation, Cryotherapy, Moist  heat, Ultrasound, and Manual therapy.  PLAN FOR NEXT SESSION: FOTO...[name] KitchenCombo e'stim, [place], dry needling, core exercise progression, body mechanics training, spinal protection techniques.   Wynelle Dreier, [place], PT 07/21/2022, 12:43 PM

## 2022-07-22 DIAGNOSIS — M62830 Muscle spasm of back: Secondary | ICD-10-CM | POA: Diagnosis not present

## 2022-07-22 DIAGNOSIS — M9902 Segmental and somatic dysfunction of thoracic region: Secondary | ICD-10-CM | POA: Diagnosis not present

## 2022-07-22 DIAGNOSIS — M9903 Segmental and somatic dysfunction of lumbar region: Secondary | ICD-10-CM | POA: Diagnosis not present

## 2022-07-22 DIAGNOSIS — M9901 Segmental and somatic dysfunction of cervical region: Secondary | ICD-10-CM | POA: Diagnosis not present

## 2022-07-28 ENCOUNTER — Ambulatory Visit: Payer: Medicare PPO | Admitting: Physical Therapy

## 2022-07-28 ENCOUNTER — Encounter: Payer: Self-pay | Admitting: Physical Therapy

## 2022-07-28 DIAGNOSIS — M5459 Other low back pain: Secondary | ICD-10-CM

## 2022-07-28 DIAGNOSIS — R293 Abnormal posture: Secondary | ICD-10-CM

## 2022-07-28 DIAGNOSIS — M62830 Muscle spasm of back: Secondary | ICD-10-CM | POA: Diagnosis not present

## 2022-07-28 NOTE — Therapy (Signed)
OUTPATIENT PHYSICAL THERAPY THORACOLUMBAR EVALUATION   Patient Name: Marissa Perez MRN: [id number] DOB:04-17-1946, 76 y.o., female Today's Date: 07/28/2022  END OF SESSION:  PT End of Session - 07/28/22 1116     Visit Number 2    Number of Visits 12    Date for PT Re-Evaluation 08/18/22    Authorization Type FOTO AT LEAST EVERY 5TH VISIT.  PROGRESS NOTE AT 10TH VISIT.  KX MODIFIER AFTER 15 VISITS.    PT Start Time 1116    PT Stop Time 1203    PT Time Calculation (min) 47 min    Activity Tolerance Patient tolerated treatment well    Behavior During Therapy WFL for tasks assessed/performed            Past Medical History:  Diagnosis Date   Acid reflux    Allergy    Arthritis    Diverticulosis    GERD (gastroesophageal reflux disease)    History of ETT 7/10   abnormal    History of stomach ulcers 2008   positive h. pylori   Hyperlipidemia    Hypertension    Hypothyroidism    IBS (irritable bowel syndrome)    Interstitial cystitis    Skin cancer    basal and squamous   Past Surgical History:  Procedure Laterality Date   CESAREAN SECTION  1972   MOHS SURGERY     TONSILLECTOMY  1951   TOTAL ABDOMINAL HYSTERECTOMY W/ BILATERAL SALPINGOOPHORECTOMY  1990   Dr. Tamala Julian / fibroids    Patient Active Problem List   Diagnosis Date Noted   Osteoarthritis of first carpometacarpal joint of left hand 01/15/2022   Osteoarthritis of first carpometacarpal joint of right hand 01/15/2022   Pain in right knee 08/22/2019   Osteoarthritis of multiple joints 01/25/2019   Lumbar spondylosis 12/28/2017   Degeneration of lumbar intervertebral disc 12/13/2017   Aortic atherosclerosis ([place]) 09/22/2017   Family history of breast cancer 07/05/2016   Skin cancer 07/05/2016   Vitamin D deficiency 07/05/2016   Hypothyroidism 12/11/2012   Hyperlipidemia 12/11/2012   Hypertension 12/11/2012   GERD (gastroesophageal reflux disease) 12/11/2012   REFERRING PROVIDER: Benedetto Goad  NP  REFERRING DIAG: Lumbar spondylosis with myelopathy  Rationale for Evaluation and Treatment: Rehabilitation  THERAPY DIAG:  Other low back pain  Abnormal posture  Muscle spasm of back  ONSET DATE: Ongoing.  SUBJECTIVE:                                                                                                                                                                                           SUBJECTIVE STATEMENT: Reports that she was  okay until she jumped up too fast earlier.  PERTINENT HISTORY:  H/o LBP, HTN hypothyroidism, OP.  PAIN:  Are you having pain? Yes: NPRS scale: 3/10 Pain location: Right low back. Pain description: As above. Aggravating factors: As above. Relieving factors: Ice sometimes.  PRECAUTIONS: OP  PATIENT GOALS: Not have pain like she is having and be able to do more.  NEXT MD VISIT:   OBJECTIVE: TODAY'S TREATMENT:                                                                                                                              DATE: 12/27  Modalities  Date:  Unattended Estim: Lumbar, IFC, 15 mins, Pain Combo: Lumbar, 1.5 w/cm2, 100%, 1 mhz, 10 mins, Pain  Manual Therapy Soft Tissue Mobilization: L glute lumbar, to reduce tone and pain    ASSESSMENT:  CLINICAL IMPRESSION: Patient presented in clinic with reports of mild L lumbar/hip pain after standing too quickly. Patient tender to palpation of the proximal R glute with mild to moderate tone of the R lumbar paraspinals/ QL as well as glute. Normal modalities response noted following removal of the modalities.  OBJECTIVE IMPAIRMENTS: decreased activity tolerance, decreased ROM, increased muscle spasms, postural dysfunction, and pain.   ACTIVITY LIMITATIONS: carrying, lifting, bending, standing, locomotion level, and caring for others  PARTICIPATION LIMITATIONS: laundry  PERSONAL FACTORS: Time since onset of injury/illness/exacerbation are also affecting patient's  functional outcome.   REHAB POTENTIAL: Good  CLINICAL DECISION MAKING: Stable/uncomplicated  EVALUATION COMPLEXITY: Low  GOALS:  LONG TERM GOALS: Target date: 08/18/22  Ind with a HEP. Goal status: INITIAL  2.  Perform ADL's with pain not > 3/10. Goal status: INITIAL  3.  Stand 20 minutes with pain not > 3/10. Goal status: INITIAL  4.  Walk a community distance with pain not > 3/10. Goal status: INITIAL  PLAN:  PT FREQUENCY: 3x/week  PT DURATION: 4 weeks  PLANNED INTERVENTIONS: Therapeutic exercises, Therapeutic activity, Patient/Family education, Self Care, Dry Needling, Electrical stimulation, Cryotherapy, Moist heat, Ultrasound, and Manual therapy.  PLAN FOR NEXT SESSION: Combo e'stim, [place], dry needling, core exercise progression, body mechanics training, spinal protection techniques.  Standley Brooking, PTA 07/28/2022, 12:22 PM

## 2022-08-04 DIAGNOSIS — M62830 Muscle spasm of back: Secondary | ICD-10-CM | POA: Diagnosis not present

## 2022-08-04 DIAGNOSIS — M9901 Segmental and somatic dysfunction of cervical region: Secondary | ICD-10-CM | POA: Diagnosis not present

## 2022-08-04 DIAGNOSIS — M9902 Segmental and somatic dysfunction of thoracic region: Secondary | ICD-10-CM | POA: Diagnosis not present

## 2022-08-04 DIAGNOSIS — M9903 Segmental and somatic dysfunction of lumbar region: Secondary | ICD-10-CM | POA: Diagnosis not present

## 2022-08-05 ENCOUNTER — Ambulatory Visit: Payer: Medicare PPO | Attending: Family Medicine | Admitting: Physical Therapy

## 2022-08-05 ENCOUNTER — Encounter: Payer: Self-pay | Admitting: Physical Therapy

## 2022-08-05 DIAGNOSIS — M5459 Other low back pain: Secondary | ICD-10-CM | POA: Diagnosis not present

## 2022-08-05 DIAGNOSIS — R293 Abnormal posture: Secondary | ICD-10-CM | POA: Insufficient documentation

## 2022-08-05 DIAGNOSIS — M62830 Muscle spasm of back: Secondary | ICD-10-CM | POA: Insufficient documentation

## 2022-08-05 NOTE — Therapy (Signed)
OUTPATIENT PHYSICAL THERAPY THORACOLUMBAR EVALUATION   Patient Name: Marissa Perez MRN: [id number] DOB:1945-10-01, 77 y.o., female Today's Date: 08/05/2022  END OF SESSION:  PT End of Session - 08/05/22 1028     Visit Number 3    Number of Visits 12    Date for PT Re-Evaluation 08/18/22    Authorization Type FOTO AT LEAST EVERY 5TH VISIT.  PROGRESS NOTE AT 10TH VISIT.  KX MODIFIER AFTER 15 VISITS.    PT Start Time 1032    PT Stop Time 1120    PT Time Calculation (min) 48 min    Activity Tolerance Patient tolerated treatment well    Behavior During Therapy WFL for tasks assessed/performed            Past Medical History:  Diagnosis Date   Acid reflux    Allergy    Arthritis    Diverticulosis    GERD (gastroesophageal reflux disease)    History of ETT 7/10   abnormal    History of stomach ulcers 2008   positive h. pylori   Hyperlipidemia    Hypertension    Hypothyroidism    IBS (irritable bowel syndrome)    Interstitial cystitis    Skin cancer    basal and squamous   Past Surgical History:  Procedure Laterality Date   CESAREAN SECTION  1972   MOHS SURGERY     TONSILLECTOMY  1951   TOTAL ABDOMINAL HYSTERECTOMY W/ BILATERAL SALPINGOOPHORECTOMY  1990   Dr. Tamala Julian / fibroids    Patient Active Problem List   Diagnosis Date Noted   Osteoarthritis of first carpometacarpal joint of left hand 01/15/2022   Osteoarthritis of first carpometacarpal joint of right hand 01/15/2022   Pain in right knee 08/22/2019   Osteoarthritis of multiple joints 01/25/2019   Lumbar spondylosis 12/28/2017   Degeneration of lumbar intervertebral disc 12/13/2017   Aortic atherosclerosis ([place]) 09/22/2017   Family history of breast cancer 07/05/2016   Skin cancer 07/05/2016   Vitamin D deficiency 07/05/2016   Hypothyroidism 12/11/2012   Hyperlipidemia 12/11/2012   Hypertension 12/11/2012   GERD (gastroesophageal reflux disease) 12/11/2012   REFERRING PROVIDER: Benedetto Goad  NP  REFERRING DIAG: Lumbar spondylosis with myelopathy  Rationale for Evaluation and Treatment: Rehabilitation  THERAPY DIAG:  Other low back pain  Abnormal posture  Muscle spasm of back  ONSET DATE: Ongoing.  SUBJECTIVE:                                                                                                                                                                                           SUBJECTIVE STATEMENT: Reports improvement but pain  since starting to come to PT. Walked about 1 mile today so far.  PERTINENT HISTORY:  H/o LBP, HTN hypothyroidism, OP.  PAIN:  Are you having pain? Yes: NPRS scale: 3-4/10 Pain location: Right low back. Pain description: As above. Aggravating factors: As above. Relieving factors: Ice sometimes.  PRECAUTIONS: OP  PATIENT GOALS: Not have pain like she is having and be able to do more.  NEXT MD VISIT:   OBJECTIVE: TODAY'S TREATMENT:                                                                                                                              DATE: 08/05/22                                    EXERCISE LOG  Exercise Repetitions and Resistance Comments  Figure 4 stretch 4x30 sec   SKTC 3x30 sec   Bridge 15 reps 3 sec    Supine clam Red x20 reps        Blank cell = exercise not performed today   Modalities  Date:  Unattended Estim: Lumbar, IFC, 15 mins, Pain Combo: Lumbar, 1.5 w/cm2, 100%, 1 mhz, 10 mins, Pain  ASSESSMENT:  CLINICAL IMPRESSION: Patient presented in clinic with reports of some improvement but still having some pain. Patient guided through light stretching especially figure 4 from possible piriformis involvement. Patient demonstrated facial grimacing with SL <> SL bed mobility. Normal modalities response noted following removal of the modalities.  OBJECTIVE IMPAIRMENTS: decreased activity tolerance, decreased ROM, increased muscle spasms, postural dysfunction, and pain.   ACTIVITY  LIMITATIONS: carrying, lifting, bending, standing, locomotion level, and caring for others  PARTICIPATION LIMITATIONS: laundry  PERSONAL FACTORS: Time since onset of injury/illness/exacerbation are also affecting patient's functional outcome.   REHAB POTENTIAL: Good  CLINICAL DECISION MAKING: Stable/uncomplicated  EVALUATION COMPLEXITY: Low  GOALS:  LONG TERM GOALS: Target date: 08/18/22  Ind with a HEP. Goal status: INITIAL  2.  Perform ADL's with pain not > 3/10. Goal status: INITIAL  3.  Stand 20 minutes with pain not > 3/10. Goal status: INITIAL  4.  Walk a community distance with pain not > 3/10. Goal status: INITIAL  PLAN:  PT FREQUENCY: 3x/week  PT DURATION: 4 weeks  PLANNED INTERVENTIONS: Therapeutic exercises, Therapeutic activity, Patient/Family education, Self Care, Dry Needling, Electrical stimulation, Cryotherapy, Moist heat, Ultrasound, and Manual therapy.  PLAN FOR NEXT SESSION: Combo e'stim, [place], dry needling, core exercise progression, body mechanics training, spinal protection techniques.  Standley Brooking, PTA 08/05/2022, 12:06 PM

## 2022-08-09 ENCOUNTER — Ambulatory Visit: Payer: Medicare PPO

## 2022-08-09 DIAGNOSIS — M5459 Other low back pain: Secondary | ICD-10-CM | POA: Diagnosis not present

## 2022-08-09 DIAGNOSIS — M62830 Muscle spasm of back: Secondary | ICD-10-CM

## 2022-08-09 DIAGNOSIS — R293 Abnormal posture: Secondary | ICD-10-CM

## 2022-08-09 NOTE — Therapy (Signed)
OUTPATIENT PHYSICAL THERAPY THORACOLUMBAR TREATMENT   Patient Name: Marissa Perez MRN: [id number] DOB:1945-11-02, 77 y.o., female Today's Date: 08/09/2022  END OF SESSION:  PT End of Session - 08/09/22 1101     Visit Number 4    Number of Visits 12    Date for PT Re-Evaluation 08/18/22    Authorization Type FOTO AT LEAST EVERY 5TH VISIT.  PROGRESS NOTE AT 10TH VISIT.  KX MODIFIER AFTER 15 VISITS.    PT Start Time 1030    PT Stop Time 1122    PT Time Calculation (min) 52 min    Activity Tolerance Patient tolerated treatment well    Behavior During Therapy WFL for tasks assessed/performed            Past Medical History:  Diagnosis Date   Acid reflux    Allergy    Arthritis    Diverticulosis    GERD (gastroesophageal reflux disease)    History of ETT 7/10   abnormal    History of stomach ulcers 2008   positive h. pylori   Hyperlipidemia    Hypertension    Hypothyroidism    IBS (irritable bowel syndrome)    Interstitial cystitis    Skin cancer    basal and squamous   Past Surgical History:  Procedure Laterality Date   CESAREAN SECTION  1972   MOHS SURGERY     TONSILLECTOMY  1951   TOTAL ABDOMINAL HYSTERECTOMY W/ BILATERAL SALPINGOOPHORECTOMY  1990   Dr. Tamala Julian / fibroids    Patient Active Problem List   Diagnosis Date Noted   Osteoarthritis of first carpometacarpal joint of left hand 01/15/2022   Osteoarthritis of first carpometacarpal joint of right hand 01/15/2022   Pain in right knee 08/22/2019   Osteoarthritis of multiple joints 01/25/2019   Lumbar spondylosis 12/28/2017   Degeneration of lumbar intervertebral disc 12/13/2017   Aortic atherosclerosis ([place]) 09/22/2017   Family history of breast cancer 07/05/2016   Skin cancer 07/05/2016   Vitamin D deficiency 07/05/2016   Hypothyroidism 12/11/2012   Hyperlipidemia 12/11/2012   Hypertension 12/11/2012   GERD (gastroesophageal reflux disease) 12/11/2012   REFERRING PROVIDER: Benedetto Goad  NP  REFERRING DIAG: Lumbar spondylosis with myelopathy  Rationale for Evaluation and Treatment: Rehabilitation  THERAPY DIAG:  Other low back pain  Abnormal posture  Muscle spasm of back  ONSET DATE: Ongoing.  SUBJECTIVE:                                                                                                                                                                                           SUBJECTIVE STATEMENT: Pt reports difficulty with  SKTC due to hernia, but reports that pain is getting better.  Walked over a mile this morning.  PERTINENT HISTORY:  H/o LBP, HTN hypothyroidism, OP.  PAIN:  Are you having pain? Yes: NPRS scale: 33/10 Pain location: Right low back. Pain description: As above. Aggravating factors: As above. Relieving factors: Ice sometimes.  PRECAUTIONS: OP  PATIENT GOALS: Not have pain like she is having and be able to do more.  NEXT MD VISIT:   OBJECTIVE: TODAY'S TREATMENT:                                                                                                                              DATE: 08/09/22  Manual Therapy Soft Tissue Mobilization: Right lower back, STW/M to right QL, lumbar paraspinals, and upper glute to decrease pain and tone with pt in left side-lying with pillow between her knees   Modalities  Date:  Unattended Estim: Lumbar, IFC, 15 mins, Pain Combo: Lumbar, 1.5 w/cm2, 100%, 1 mhz, 10 mins, Pain  ASSESSMENT:  CLINICAL IMPRESSION: Pt arrives for today's treatment session reporting 3/10 right lower back pain. Pt reports performing exercises and stretches at home and requests STW and modalities while at facility.  Pt responds well to all manual therapy.  Normal responses to all modalities noted upon removal.  Pt reported 1/10 low back pain at completion of today's treatment session.   OBJECTIVE IMPAIRMENTS: decreased activity tolerance, decreased ROM, increased muscle spasms, postural dysfunction, and pain.    ACTIVITY LIMITATIONS: carrying, lifting, bending, standing, locomotion level, and caring for others  PARTICIPATION LIMITATIONS: laundry  PERSONAL FACTORS: Time since onset of injury/illness/exacerbation are also affecting patient's functional outcome.   REHAB POTENTIAL: Good  CLINICAL DECISION MAKING: Stable/uncomplicated  EVALUATION COMPLEXITY: Low  GOALS:  LONG TERM GOALS: Target date: 08/18/22  Ind with a HEP. Goal status: INITIAL  2.  Perform ADL's with pain not > 3/10. Goal status: INITIAL  3.  Stand 20 minutes with pain not > 3/10. Goal status: INITIAL  4.  Walk a community distance with pain not > 3/10. Goal status: INITIAL  PLAN:  PT FREQUENCY: 3x/week  PT DURATION: 4 weeks  PLANNED INTERVENTIONS: Therapeutic exercises, Therapeutic activity, Patient/Family education, Self Care, Dry Needling, Electrical stimulation, Cryotherapy, Moist heat, Ultrasound, and Manual therapy.  PLAN FOR NEXT SESSION: Combo e'stim, [place], dry needling, core exercise progression, body mechanics training, spinal protection techniques.  Kathrynn Ducking, PTA 08/09/2022, 11:26 AM

## 2022-08-13 ENCOUNTER — Ambulatory Visit: Payer: Medicare PPO | Admitting: Physical Therapy

## 2022-08-13 DIAGNOSIS — M5459 Other low back pain: Secondary | ICD-10-CM

## 2022-08-13 DIAGNOSIS — R293 Abnormal posture: Secondary | ICD-10-CM | POA: Diagnosis not present

## 2022-08-13 DIAGNOSIS — M62830 Muscle spasm of back: Secondary | ICD-10-CM | POA: Diagnosis not present

## 2022-08-13 NOTE — Therapy (Signed)
OUTPATIENT PHYSICAL THERAPY THORACOLUMBAR TREATMENT   Patient Name: Marissa Perez MRN: [id number] DOB:February 01, 1946, 77 y.o., female Today's Date: 08/13/2022  END OF SESSION:  PT End of Session - 08/13/22 1108     Visit Number 5    Number of Visits 12    Date for PT Re-Evaluation 08/18/22    Authorization Type FOTO AT LEAST EVERY 5TH VISIT.  PROGRESS NOTE AT 10TH VISIT.  KX MODIFIER AFTER 15 VISITS.    PT Start Time 1030    PT Stop Time 1120    PT Time Calculation (min) 50 min    Activity Tolerance Patient tolerated treatment well    Behavior During Therapy WFL for tasks assessed/performed            Past Medical History:  Diagnosis Date   Acid reflux    Allergy    Arthritis    Diverticulosis    GERD (gastroesophageal reflux disease)    History of ETT 7/10   abnormal    History of stomach ulcers 2008   positive h. pylori   Hyperlipidemia    Hypertension    Hypothyroidism    IBS (irritable bowel syndrome)    Interstitial cystitis    Skin cancer    basal and squamous   Past Surgical History:  Procedure Laterality Date   CESAREAN SECTION  1972   MOHS SURGERY     TONSILLECTOMY  1951   TOTAL ABDOMINAL HYSTERECTOMY W/ BILATERAL SALPINGOOPHORECTOMY  1990   Dr. Tamala Julian / fibroids    Patient Active Problem List   Diagnosis Date Noted   Osteoarthritis of first carpometacarpal joint of left hand 01/15/2022   Osteoarthritis of first carpometacarpal joint of right hand 01/15/2022   Pain in right knee 08/22/2019   Osteoarthritis of multiple joints 01/25/2019   Lumbar spondylosis 12/28/2017   Degeneration of lumbar intervertebral disc 12/13/2017   Aortic atherosclerosis ([place]) 09/22/2017   Family history of breast cancer 07/05/2016   Skin cancer 07/05/2016   Vitamin D deficiency 07/05/2016   Hypothyroidism 12/11/2012   Hyperlipidemia 12/11/2012   Hypertension 12/11/2012   GERD (gastroesophageal reflux disease) 12/11/2012   REFERRING PROVIDER: Benedetto Goad  NP  REFERRING DIAG: Lumbar spondylosis with myelopathy  Rationale for Evaluation and Treatment: Rehabilitation  THERAPY DIAG:  Other low back pain  Abnormal posture  Muscle spasm of back  ONSET DATE: Ongoing.  SUBJECTIVE:                                                                                                                                                                                           SUBJECTIVE STATEMENT: Getting better.  Also  had a massage this week. PERTINENT HISTORY:  H/o LBP, HTN hypothyroidism, OP.  PAIN:  Are you having pain? Yes: NPRS scale: 2/10 Pain location: Right low back. Pain description: As above. Aggravating factors: As above. Relieving factors: Ice sometimes.  PRECAUTIONS: OP  PATIENT GOALS: Not have pain like she is having and be able to do more.  NEXT MD VISIT:   OBJECTIVE: TODAY'S TREATMENT:                                                                                                                              DATE: 08/13/22.   Nustep level 3 x 8 minutes  Manual Therapy Soft Tissue Mobilization: Right lower back, STW/M to right QL, lumbar paraspinals, and upper glute and piriformis to decrease pain and tone with pt in left side-lying with pillow between her knees  x 7 minutes.   Unattended Estim: Lumbar, IFC, 15 mins, Pain Combo: Lumbar, 1.5 w/cm2, 100%, 1 mhz, 8 mins, Pain  ASSESSMENT:  CLINICAL IMPRESSION: Pt arrives for today's treatment session reporting 2/10 right lower back pain.  She is pleased with her progress thus far.  Pt responds well to all manual therapy.  Normal responses to all modalities noted upon removal.  Low pain post treatment.    OBJECTIVE IMPAIRMENTS: decreased activity tolerance, decreased ROM, increased muscle spasms, postural dysfunction, and pain.   ACTIVITY LIMITATIONS: carrying, lifting, bending, standing, locomotion level, and caring for others  PARTICIPATION LIMITATIONS:  laundry  PERSONAL FACTORS: Time since onset of injury/illness/exacerbation are also affecting patient's functional outcome.   REHAB POTENTIAL: Good  CLINICAL DECISION MAKING: Stable/uncomplicated  EVALUATION COMPLEXITY: Low  GOALS:  LONG TERM GOALS: Target date: 08/18/22  Ind with a HEP. Goal status: INITIAL  2.  Perform ADL's with pain not > 3/10. Goal status: INITIAL  3.  Stand 20 minutes with pain not > 3/10. Goal status: INITIAL  4.  Walk a community distance with pain not > 3/10. Goal status: INITIAL  PLAN:  PT FREQUENCY: 3x/week  PT DURATION: 4 weeks  PLANNED INTERVENTIONS: Therapeutic exercises, Therapeutic activity, Patient/Family education, Self Care, Dry Needling, Electrical stimulation, Cryotherapy, Moist heat, Ultrasound, and Manual therapy.  PLAN FOR NEXT SESSION: Combo e'stim, [place], dry needling, core exercise progression, body mechanics training, spinal protection techniques.  Sir Mallis, [place], PT 08/13/2022, 11:29 AM

## 2022-08-16 ENCOUNTER — Ambulatory Visit: Payer: Medicare PPO

## 2022-08-16 DIAGNOSIS — R293 Abnormal posture: Secondary | ICD-10-CM

## 2022-08-16 DIAGNOSIS — M62830 Muscle spasm of back: Secondary | ICD-10-CM

## 2022-08-16 DIAGNOSIS — M5459 Other low back pain: Secondary | ICD-10-CM | POA: Diagnosis not present

## 2022-08-16 NOTE — Therapy (Signed)
OUTPATIENT PHYSICAL THERAPY THORACOLUMBAR TREATMENT   Patient Name: Marissa Perez MRN: [id number] DOB:[birth year], 77 y.o., female Today's Date: 08/16/2022  END OF SESSION:  PT End of Session - 08/16/22 1033     Visit Number 6    Number of Visits 12    Date for PT Re-Evaluation 08/18/22    Authorization Type FOTO AT LEAST EVERY 5TH VISIT.  PROGRESS NOTE AT 10TH VISIT.  KX MODIFIER AFTER 15 VISITS.    PT Start Time 1030    Activity Tolerance Patient tolerated treatment well    Behavior During Therapy WFL for tasks assessed/performed            Past Medical History:  Diagnosis Date   Acid reflux    Allergy    Arthritis    Diverticulosis    GERD (gastroesophageal reflux disease)    History of ETT 7/10   abnormal    History of stomach ulcers 2008   positive h. pylori   Hyperlipidemia    Hypertension    Hypothyroidism    IBS (irritable bowel syndrome)    Interstitial cystitis    Skin cancer    basal and squamous   Past Surgical History:  Procedure Laterality Date   CESAREAN SECTION  1972   MOHS SURGERY     TONSILLECTOMY  1951   TOTAL ABDOMINAL HYSTERECTOMY W/ BILATERAL SALPINGOOPHORECTOMY  1990   Dr. Tamala Julian / fibroids    Patient Active Problem List   Diagnosis Date Noted   Osteoarthritis of first carpometacarpal joint of left hand 01/15/2022   Osteoarthritis of first carpometacarpal joint of right hand 01/15/2022   Pain in right knee 08/22/2019   Osteoarthritis of multiple joints 01/25/2019   Lumbar spondylosis 12/28/2017   Degeneration of lumbar intervertebral disc 12/13/2017   Aortic atherosclerosis ([place]) 09/22/2017   Family history of breast cancer 07/05/2016   Skin cancer 07/05/2016   Vitamin D deficiency 07/05/2016   Hypothyroidism 12/11/2012   Hyperlipidemia 12/11/2012   Hypertension 12/11/2012   GERD (gastroesophageal reflux disease) 12/11/2012   REFERRING PROVIDER: Benedetto Goad NP  REFERRING DIAG: Lumbar spondylosis with  myelopathy  Rationale for Evaluation and Treatment: Rehabilitation  THERAPY DIAG:  Other low back pain  Abnormal posture  Muscle spasm of back  ONSET DATE: Ongoing.  SUBJECTIVE:                                                                                                                                                                                           SUBJECTIVE STATEMENT: Pt walked mile and half this morning without issue.  PERTINENT HISTORY:  H/o LBP, HTN hypothyroidism, OP.  PAIN:  Are you having pain? Yes: NPRS scale: 1/10 Pain location: Right low back. Pain description: As above. Aggravating factors: As above. Relieving factors: Ice sometimes.  PRECAUTIONS: OP  PATIENT GOALS: Not have pain like she is having and be able to do more.  NEXT MD VISIT:   OBJECTIVE: TODAY'S TREATMENT:                                                                                                                              DATE: 08/16/22.                                      EXERCISE LOG  Exercise Repetitions and Resistance Comments  Nustep Lvl 3 x 10 mins        Blank cell = exercise not performed today   Manual Therapy Soft Tissue Mobilization: Right lower back, STW/M to right QL, lumbar paraspinals, and upper glute and piriformis to decrease pain and tone with pt in left side-lying with pillow between her knees    Unattended Estim: Lumbar, IFC, 15 mins, Pain Combo: Lumbar, 1.5 w/cm2, 100%, 1 mhz, 8 mins, Pain  ASSESSMENT:  CLINICAL IMPRESSION: Pt arrives for today's treatment session reporting 1/10 low back pain.  Pt able to tolerate increased time on Nustep today with minimal fatigue.  Good results from STW/M to right lumbar paraspinals, QL, and upper glute to decrease pain and tone.  Normal responses to all modalities noted upon completion.  Pt denied any pain upon completion of today's treatment session.   OBJECTIVE IMPAIRMENTS: decreased activity tolerance,  decreased ROM, increased muscle spasms, postural dysfunction, and pain.   ACTIVITY LIMITATIONS: carrying, lifting, bending, standing, locomotion level, and caring for others  PARTICIPATION LIMITATIONS: laundry  PERSONAL FACTORS: Time since onset of injury/illness/exacerbation are also affecting patient's functional outcome.   REHAB POTENTIAL: Good  CLINICAL DECISION MAKING: Stable/uncomplicated  EVALUATION COMPLEXITY: Low  GOALS:  LONG TERM GOALS: Target date: 08/18/22  Ind with a HEP. Goal status: MET  2.  Perform ADL's with pain not > 3/10.  Progress: 1/15: most days Goal status: IN PROGRESS  3.  Stand 20 minutes with pain not > 3/10. Progress: 1/15: 8-10 mins prior to 3/10 Goal status: IN PROGRESS  4.  Walk a community distance with pain not > 3/10. Progress: 1/15: depends on the day or activity level Goal status: IN PROGRESS  PLAN:  PT FREQUENCY: 3x/week  PT DURATION: 4 weeks  PLANNED INTERVENTIONS: Therapeutic exercises, Therapeutic activity, Patient/Family education, Self Care, Dry Needling, Electrical stimulation, Cryotherapy, Moist heat, Ultrasound, and Manual therapy.  PLAN FOR NEXT SESSION: Combo e'stim, [place], dry needling, core exercise progression, body mechanics training, spinal protection techniques.  Kathrynn Ducking, PTA 08/16/2022, 11:30 AM

## 2022-08-18 ENCOUNTER — Ambulatory Visit: Payer: Medicare PPO | Admitting: Family Medicine

## 2022-08-18 ENCOUNTER — Encounter: Payer: Self-pay | Admitting: Family Medicine

## 2022-08-18 VITALS — BP 123/56 | HR 55 | Temp 97.9°F | Ht 61.0 in | Wt 135.4 lb

## 2022-08-18 DIAGNOSIS — E559 Vitamin D deficiency, unspecified: Secondary | ICD-10-CM

## 2022-08-18 DIAGNOSIS — I10 Essential (primary) hypertension: Secondary | ICD-10-CM | POA: Diagnosis not present

## 2022-08-18 DIAGNOSIS — E034 Atrophy of thyroid (acquired): Secondary | ICD-10-CM | POA: Diagnosis not present

## 2022-08-18 DIAGNOSIS — E782 Mixed hyperlipidemia: Secondary | ICD-10-CM | POA: Diagnosis not present

## 2022-08-18 DIAGNOSIS — K219 Gastro-esophageal reflux disease without esophagitis: Secondary | ICD-10-CM | POA: Diagnosis not present

## 2022-08-18 DIAGNOSIS — I700 Atherosclerosis of aorta: Secondary | ICD-10-CM | POA: Diagnosis not present

## 2022-08-18 DIAGNOSIS — M62830 Muscle spasm of back: Secondary | ICD-10-CM | POA: Diagnosis not present

## 2022-08-18 DIAGNOSIS — K5904 Chronic idiopathic constipation: Secondary | ICD-10-CM

## 2022-08-18 DIAGNOSIS — M9903 Segmental and somatic dysfunction of lumbar region: Secondary | ICD-10-CM | POA: Diagnosis not present

## 2022-08-18 DIAGNOSIS — M9901 Segmental and somatic dysfunction of cervical region: Secondary | ICD-10-CM | POA: Diagnosis not present

## 2022-08-18 DIAGNOSIS — M85832 Other specified disorders of bone density and structure, left forearm: Secondary | ICD-10-CM

## 2022-08-18 DIAGNOSIS — M9902 Segmental and somatic dysfunction of thoracic region: Secondary | ICD-10-CM | POA: Diagnosis not present

## 2022-08-18 MED ORDER — EZETIMIBE 10 MG PO TABS: 10.0000 mg | ORAL_TABLET | Freq: Every day | ORAL | 3 refills | Status: DC

## 2022-08-18 MED ORDER — OMEPRAZOLE 20 MG PO CPDR: DELAYED_RELEASE_CAPSULE | ORAL | 3 refills | Status: DC

## 2022-08-18 MED ORDER — LEVOTHYROXINE SODIUM 50 MCG PO TABS: ORAL_TABLET | ORAL | 3 refills | Status: DC

## 2022-08-18 MED ORDER — ICOSAPENT ETHYL 1 G PO CAPS: ORAL_CAPSULE | ORAL | 3 refills | Status: DC

## 2022-08-18 MED ORDER — ATORVASTATIN CALCIUM 20 MG PO TABS: 20.0000 mg | ORAL_TABLET | Freq: Every day | ORAL | 3 refills | Status: DC

## 2022-08-18 MED ORDER — LUBIPROSTONE 24 MCG PO CAPS: 24.0000 ug | ORAL_CAPSULE | Freq: Two times a day (BID) | ORAL | 1 refills | Status: DC

## 2022-08-18 MED ORDER — LOSARTAN POTASSIUM 100 MG PO TABS: 100.0000 mg | ORAL_TABLET | Freq: Every day | ORAL | 1 refills | Status: DC

## 2022-08-18 NOTE — Patient Instructions (Signed)
Ok to try Amitiza INSTEAD of linzess. Technically, indicated for twice daily but can try once daily if OVERstimulates the gut.

## 2022-08-18 NOTE — Progress Notes (Signed)
Subjective: CC: Follow-up hypothyroidism, constipation PCP: [name], DO CWC:Marissa Perez is a 77 y.o. female presenting to clinic today for:  1.  Hypothyroidism Patient is compliant with her thyroid replacement.  No reports of tremor, heart palpitations.  She does report chronic constipation and this is relieved by the Linzess 72 mcg daily but she notes that she also ends up having watery diarrhea at times.  No blood in stool.  No appreciable belly pains, nausea or vomiting.  2.  Hypertension with hyperlipidemia Compliant with Cozaar, Lipitor, Zetia and Vascepa.  No chest pain, shortness of breath   ROS: Per HPI  Allergies  Allergen Reactions   Codeine     Headache     Femring [Estradiol] Rash   Cephalexin Rash   Erythromycin Other (See Comments)    Gi  Upset     Penicillins Rash   Past Medical History:  Diagnosis Date   Acid reflux    Allergy    Arthritis    Diverticulosis    GERD (gastroesophageal reflux disease)    History of ETT 7/10   abnormal    History of stomach ulcers 2008   positive h. pylori   Hyperlipidemia    Hypertension    Hypothyroidism    IBS (irritable bowel syndrome)    Interstitial cystitis    Skin cancer    basal and squamous    Current Outpatient Medications:    Calcium Citrate-Vitamin D (CALCIUM CITRATE + D3 MAXIMUM) 315-250 MG-UNIT TABS, Take 1 tablet by mouth 2 (two) times daily. (Patient taking differently: Take 1 tablet by mouth 2 (two) times daily. With magnesium), Disp: 120 tablet, Rfl:    Cholecalciferol (VITAMIN D PO), Take 5,000 Units by mouth daily., Disp: , Rfl:    clobetasol (TEMOVATE) 0.05 % external solution, Apply 1 application topically daily as needed. , Disp: , Rfl:    Cyanocobalamin (B-12) 1000 MCG SUBL, Place under the tongue., Disp: , Rfl:    desonide (DESOWEN) 0.05 % cream, Apply topically 2 (two) times daily. X1 week, Disp: 30 g, Rfl: 0   diclofenac Sodium (VOLTAREN) 1 % GEL, Apply 4 g topically 4  (four) times daily., Disp: , Rfl:    famotidine (PEPCID) 20 MG tablet, Take 1 tablet (20 mg total) by mouth daily. X2-4 weeks, Disp: 30 tablet, Rfl: 0   gabapentin (NEURONTIN) 100 MG capsule, TAKE 1 CAPSULE IN THE MORNING, AND 2 CAPSULES AT BEDTIME, Disp: 270 capsule, Rfl: 3   Glucosamine-Chondroit-Vit C-Mn (GLUCOSAMINE CHONDR 1500 COMPLX PO), Take by mouth., Disp: , Rfl:    lubiprostone (AMITIZA) 24 MCG capsule, Take 1 capsule (24 mcg total) by mouth 2 (two) times daily with a meal., Disp: 180 capsule, Rfl: 1   meloxicam (MOBIC) 15 MG tablet, TAKE ONE TABLET DAILY AS NEEDED FOR PAIN, Disp: 90 tablet, Rfl: 0   Multiple Vitamin (MULTI-VITAMIN PO), Take by mouth daily., Disp: , Rfl:    Probiotic Product (PROBIOTIC DAILY PO), Take by mouth., Disp: , Rfl:    atorvastatin (LIPITOR) 20 MG tablet, Take 1 tablet (20 mg total) by mouth daily., Disp: 90 tablet, Rfl: 3   ezetimibe (ZETIA) 10 MG tablet, Take 1 tablet (10 mg total) by mouth daily., Disp: 90 tablet, Rfl: 3   icosapent Ethyl (VASCEPA) 1 g capsule, TAKE (2) CAPSULES TWICE DAILY., Disp: 360 capsule, Rfl: 3   levothyroxine (SYNTHROID) 50 MCG tablet, Take 1/2 tablet on Mon, Wed, Fri and 1 tablet daily Tues and Thursday, Disp: 90 tablet,  Rfl: 3   losartan (COZAAR) 100 MG tablet, Take 1 tablet (100 mg total) by mouth daily., Disp: 90 tablet, Rfl: 1   omeprazole (PRILOSEC) 20 MG capsule, TAKE 1 CAPSULE 2 TIMES A DAY BEFORE A MEAL, Disp: 180 capsule, Rfl: 3  Current Facility-Administered Medications:    methylPREDNISolone acetate (DEPO-MEDROL) injection 40 mg, 40 mg, Intramuscular, Once, [name], DO Social History   Socioeconomic History   Marital status: Married    Spouse name: [name]"   Number of children: 1   Years of education: 16   Highest education level: Bachelor's degree (e.g., BA, AB, BS)  Occupational History   Occupation: Retired    Fish farm manager: Engineer, water    Comment: Teacher  Tobacco Use   Smoking status:  Never   Smokeless tobacco: Never  Vaping Use   Vaping Use: Never used  Substance and Sexual Activity   Alcohol use: Yes    Alcohol/week: 7.0 standard drinks of alcohol    Types: 7 Glasses of wine per week    Comment: 1 daily   Drug use: No   Sexual activity: Never  Other Topics Concern   Not on file  Social History Narrative   Lives home with her husband "[name]" Their son lives in [place]. One grandchild   Social Determinants of Health   Financial Resource Strain: Low Risk  (01/29/2022)   Overall Financial Resource Strain (CARDIA)    Difficulty of Paying Living Expenses: Not hard at all  Food Insecurity: No Food Insecurity (01/29/2022)   Hunger Vital Sign    Worried About Running Out of Food in the Last Year: Never true    Ran Out of Food in the Last Year: Never true  Transportation Needs: No Transportation Needs (01/29/2022)   PRAPARE - Hydrologist (Medical): No    Lack of Transportation (Non-Medical): No  Physical Activity: Insufficiently Active (01/29/2022)   Exercise Vital Sign    Days of Exercise per Week: 7 days    Minutes of Exercise per Session: 20 min  Stress: No Stress Concern Present (01/29/2022)   [place]    Feeling of Stress : Only a little  Social Connections: Socially Integrated (01/29/2022)   Social Connection and Isolation Panel [NHANES]    Frequency of Communication with Friends and Family: More than three times a week    Frequency of Social Gatherings with Friends and Family: More than three times a week    Attends Religious Services: More than 4 times per year    Active Member of [employer] or Organizations: Yes    Attends Music therapist: More than 4 times per year    Marital Status: Married  Human resources officer Violence: Not At Risk (01/29/2022)   Humiliation, Afraid, Rape, and Kick questionnaire    Fear of Current or Ex-Partner: No    Emotionally  Abused: No    Physically Abused: No    Sexually Abused: No   Family History  Problem Relation Age of Onset   Diabetes Father    Heart disease Father    Heart attack Father 15   Breast cancer Mother    Breast cancer Paternal Grandmother        Aunt also    Breast cancer Other        Family History   Breast cancer Maternal Aunt    Breast cancer Paternal Aunt    Colon cancer Neg Hx  Esophageal cancer Neg Hx    Rectal cancer Neg Hx    Stomach cancer Neg Hx     Objective: Office vital signs reviewed. BP (!) 123/56   Pulse (!) 55   Temp 97.9 F (36.6 C)   Ht '5\' 1"'$  (1.549 m)   Wt 135 lb 6.4 oz (61.4 kg)   SpO2 98%   BMI 25.58 kg/m   Physical Examination:  General: Awake, alert, well nourished, No acute distress HEENT: No lymphadenopathy or appreciable goiter.  No exophthalmos Cardio: regular rate and rhythm, S1S2 heard, no murmurs appreciated Pulm: clear to auscultation bilaterally, no wheezes, rhonchi or rales; normal work of breathing on room air Neuro: No tremor  Assessment/ Plan: 77 y.o. female   Chronic idiopathic constipation - Plan: lubiprostone (AMITIZA) 24 MCG capsule  Hypothyroidism due to acquired atrophy of thyroid - Plan: TSH, T4, Free, levothyroxine (SYNTHROID) 50 MCG tablet  Mixed hyperlipidemia - Plan: atorvastatin (LIPITOR) 20 MG tablet, ezetimibe (ZETIA) 10 MG tablet, icosapent Ethyl (VASCEPA) 1 g capsule  Essential hypertension - Plan: CMP14+EGFR, Lipid Panel, losartan (COZAAR) 100 MG tablet  Aortic atherosclerosis (HCC) - Plan: CMP14+EGFR, CBC, Lipid Panel, atorvastatin (LIPITOR) 20 MG tablet, ezetimibe (ZETIA) 10 MG tablet, icosapent Ethyl (VASCEPA) 1 g capsule  Vitamin D deficiency - Plan: VITAMIN D 25 Hydroxy (Vit-D Deficiency, Fractures)  Gastroesophageal reflux disease without esophagitis - Plan: omeprazole (PRILOSEC) 20 MG capsule  Osteopenia of left forearm - Plan: DG [facility] DEXA, CBC, VITAMIN D 25 Hydroxy (Vit-D Deficiency,  Fractures)  Since she is having some diarrheal symptoms with Linzess we can try Amitiza and perhaps at once daily rather than twice daily.  Discussed possibly Trulance as well but I believe her insurance did not cover previously  Check thyroid levels along with fasting labs later this week.  Synthroid renewed  Continue multi med treatment for hyperlipidemia.  Blood pressure is controlled upon recheck.  Continue current regimen  Check vitamin D level, repeat DEXA scan to follow-up osteopenia of the left forearm noted in 2022  Orders Placed This Encounter  Procedures   DG [facility] DEXA    Standing Status:   Future    Standing Expiration Date:   08/19/2023    Order Specific Question:   Reason for Exam (SYMPTOM  OR DIAGNOSIS REQUIRED)    Answer:   screen osteoporosis   TSH    Standing Status:   Future    Standing Expiration Date:   08/19/2023   T4, Free    Standing Status:   Future    Standing Expiration Date:   08/19/2023   CMP14+EGFR    Standing Status:   Future    Standing Expiration Date:   08/19/2023   CBC    Standing Status:   Future    Standing Expiration Date:   08/19/2023   Lipid Panel    Standing Status:   Future    Standing Expiration Date:   08/19/2023   VITAMIN D 25 Hydroxy (Vit-D Deficiency, Fractures)    Standing Status:   Future    Standing Expiration Date:   08/19/2023   Meds ordered this encounter  Medications   atorvastatin (LIPITOR) 20 MG tablet    Sig: Take 1 tablet (20 mg total) by mouth daily.    Dispense:  90 tablet    Refill:  3   ezetimibe (ZETIA) 10 MG tablet    Sig: Take 1 tablet (10 mg total) by mouth daily.    Dispense:  90 tablet  Refill:  3   icosapent Ethyl (VASCEPA) 1 g capsule    Sig: TAKE (2) CAPSULES TWICE DAILY.    Dispense:  360 capsule    Refill:  3   omeprazole (PRILOSEC) 20 MG capsule    Sig: TAKE 1 CAPSULE 2 TIMES A DAY BEFORE A MEAL    Dispense:  180 capsule    Refill:  3   levothyroxine (SYNTHROID) 50 MCG tablet    Sig: Take 1/2  tablet on Mon, Wed, Fri and 1 tablet daily Tues and Thursday    Dispense:  90 tablet    Refill:  3   losartan (COZAAR) 100 MG tablet    Sig: Take 1 tablet (100 mg total) by mouth daily.    Dispense:  90 tablet    Refill:  1   lubiprostone (AMITIZA) 24 MCG capsule    Sig: Take 1 capsule (24 mcg total) by mouth 2 (two) times daily with a meal.    Dispense:  180 capsule    Refill:  [place], DO [place] [phone]

## 2022-08-19 ENCOUNTER — Ambulatory Visit: Payer: Medicare PPO | Admitting: Physical Therapy

## 2022-08-19 ENCOUNTER — Encounter: Payer: Self-pay | Admitting: Physical Therapy

## 2022-08-19 ENCOUNTER — Other Ambulatory Visit: Payer: Medicare PPO

## 2022-08-19 DIAGNOSIS — R293 Abnormal posture: Secondary | ICD-10-CM | POA: Diagnosis not present

## 2022-08-19 DIAGNOSIS — I10 Essential (primary) hypertension: Secondary | ICD-10-CM

## 2022-08-19 DIAGNOSIS — I700 Atherosclerosis of aorta: Secondary | ICD-10-CM | POA: Diagnosis not present

## 2022-08-19 DIAGNOSIS — M85832 Other specified disorders of bone density and structure, left forearm: Secondary | ICD-10-CM

## 2022-08-19 DIAGNOSIS — E034 Atrophy of thyroid (acquired): Secondary | ICD-10-CM | POA: Diagnosis not present

## 2022-08-19 DIAGNOSIS — H25813 Combined forms of age-related cataract, bilateral: Secondary | ICD-10-CM | POA: Diagnosis not present

## 2022-08-19 DIAGNOSIS — E559 Vitamin D deficiency, unspecified: Secondary | ICD-10-CM

## 2022-08-19 DIAGNOSIS — H02834 Dermatochalasis of left upper eyelid: Secondary | ICD-10-CM | POA: Diagnosis not present

## 2022-08-19 DIAGNOSIS — M62830 Muscle spasm of back: Secondary | ICD-10-CM | POA: Diagnosis not present

## 2022-08-19 DIAGNOSIS — D3131 Benign neoplasm of right choroid: Secondary | ICD-10-CM | POA: Diagnosis not present

## 2022-08-19 DIAGNOSIS — M5459 Other low back pain: Secondary | ICD-10-CM | POA: Diagnosis not present

## 2022-08-19 DIAGNOSIS — H02831 Dermatochalasis of right upper eyelid: Secondary | ICD-10-CM | POA: Diagnosis not present

## 2022-08-19 NOTE — Therapy (Signed)
OUTPATIENT PHYSICAL THERAPY THORACOLUMBAR TREATMENT   Patient Name: Marissa Perez MRN: [id number] DOB:[birth year], 77 y.o., female Today's Date: 08/19/2022  END OF SESSION:  PT End of Session - 08/19/22 0947     Visit Number 7    Number of Visits 12    Date for PT Re-Evaluation 08/18/22    Authorization Type FOTO AT LEAST EVERY 5TH VISIT.  PROGRESS NOTE AT 10TH VISIT.  KX MODIFIER AFTER 15 VISITS.    PT Start Time [phone]    PT Stop Time 1033    PT Time Calculation (min) 46 min    Activity Tolerance Patient tolerated treatment well    Behavior During Therapy WFL for tasks assessed/performed            Past Medical History:  Diagnosis Date   Acid reflux    Allergy    Arthritis    Diverticulosis    GERD (gastroesophageal reflux disease)    History of ETT 7/10   abnormal    History of stomach ulcers 2008   positive h. pylori   Hyperlipidemia    Hypertension    Hypothyroidism    IBS (irritable bowel syndrome)    Interstitial cystitis    Skin cancer    basal and squamous   Past Surgical History:  Procedure Laterality Date   CESAREAN SECTION  1972   MOHS SURGERY     TONSILLECTOMY  1951   TOTAL ABDOMINAL HYSTERECTOMY W/ BILATERAL SALPINGOOPHORECTOMY  1990   Dr. Tamala Julian / fibroids    Patient Active Problem List   Diagnosis Date Noted   Osteoarthritis of first carpometacarpal joint of left hand 01/15/2022   Osteoarthritis of first carpometacarpal joint of right hand 01/15/2022   Pain in right knee 08/22/2019   Osteoarthritis of multiple joints 01/25/2019   Lumbar spondylosis 12/28/2017   Degeneration of lumbar intervertebral disc 12/13/2017   Aortic atherosclerosis ([place]) 09/22/2017   Family history of breast cancer 07/05/2016   Skin cancer 07/05/2016   Vitamin D deficiency 07/05/2016   Hypothyroidism 12/11/2012   Hyperlipidemia 12/11/2012   Hypertension 12/11/2012   GERD (gastroesophageal reflux disease) 12/11/2012   REFERRING PROVIDER: Benedetto Goad  NP  REFERRING DIAG: Lumbar spondylosis with myelopathy  Rationale for Evaluation and Treatment: Rehabilitation  THERAPY DIAG:  Other low back pain  Abnormal posture  Muscle spasm of back  ONSET DATE: Ongoing.  SUBJECTIVE:                                                                                                                                                                                           SUBJECTIVE STATEMENT: Doing better with soreness  while walking. More sore after HEP stretches yesterday. Went to Dr. Andres Labrum for adjustment yesterday. Goes every two weeks.   PERTINENT HISTORY:  H/o LBP, HTN hypothyroidism, OP.  PAIN:  Are you having pain? Yes: NPRS scale: 1/10 Pain location: Right low back. Pain description: As above. Aggravating factors: As above. Relieving factors: Ice sometimes.  PRECAUTIONS: OP  PATIENT GOALS: Not have pain like she is having and be able to do more.  NEXT MD VISIT:   OBJECTIVE: TODAY'S TREATMENT:                                                                                                                              DATE: 08/16/22.                      Manual Therapy Soft Tissue Mobilization: Right lower back, STW/M to right upper glute and piriformis to decrease pain and tone with pt in left side-lying with pillow between her knees    Unattended Estim: Lumbar, IFC, 15 mins, Pain Combo: Lumbar, 1.5 w/cm2, 100%, 1 mhz, 10 mins, Pain  ASSESSMENT:  CLINICAL IMPRESSION: Patient presented in clinic with reports of mild pain in R SI region. Patient also sees a massage therapist who has commented on tone of the R glute/ low back area. Patient denied any tenderness to manual therapy. Continues with HEP stretching with comments of soreness afterwards. Normal modalities response noted following removal of the modalities.  OBJECTIVE IMPAIRMENTS: decreased activity tolerance, decreased ROM, increased muscle spasms, postural  dysfunction, and pain.   ACTIVITY LIMITATIONS: carrying, lifting, bending, standing, locomotion level, and caring for others  PARTICIPATION LIMITATIONS: laundry  PERSONAL FACTORS: Time since onset of injury/illness/exacerbation are also affecting patient's functional outcome.   REHAB POTENTIAL: Good  CLINICAL DECISION MAKING: Stable/uncomplicated  EVALUATION COMPLEXITY: Low  GOALS:  LONG TERM GOALS: Target date: 08/18/22  Ind with a HEP. Goal status: MET  2.  Perform ADL's with pain not > 3/10.  Progress: 1/15: most days Goal status: IN PROGRESS  3.  Stand 20 minutes with pain not > 3/10. Progress: 1/15: 8-10 mins prior to 3/10 Goal status: IN PROGRESS  4.  Walk a community distance with pain not > 3/10. Progress: 1/15: depends on the day or activity level Goal status: IN PROGRESS  PLAN:  PT FREQUENCY: 3x/week  PT DURATION: 4 weeks  PLANNED INTERVENTIONS: Therapeutic exercises, Therapeutic activity, Patient/Family education, Self Care, Dry Needling, Electrical stimulation, Cryotherapy, Moist heat, Ultrasound, and Manual therapy.  PLAN FOR NEXT SESSION: Combo e'stim, [place], dry needling, core exercise progression, body mechanics training, spinal protection techniques.  Standley Brooking, PTA 08/19/2022, 10:41 AM

## 2022-08-20 ENCOUNTER — Other Ambulatory Visit: Payer: Self-pay

## 2022-08-20 LAB — CMP14+EGFR
ALT: 37 IU/L — ABNORMAL HIGH (ref 0–32)
AST: 29 IU/L (ref 0–40)
Albumin/Globulin Ratio: 1.9 (ref 1.2–2.2)
Albumin: 4.6 g/dL (ref 3.8–4.8)
Alkaline Phosphatase: 90 IU/L (ref 44–121)
BUN/Creatinine Ratio: 15 (ref 12–28)
BUN: 10 mg/dL (ref 8–27)
Bilirubin Total: 0.3 mg/dL (ref 0.0–1.2)
CO2: 25 mmol/L (ref 20–29)
Calcium: 9.7 mg/dL (ref 8.7–10.3)
Chloride: 98 mmol/L (ref 96–106)
Creatinine, Ser: 0.65 mg/dL (ref 0.57–1.00)
Globulin, Total: 2.4 g/dL (ref 1.5–4.5)
Glucose: 96 mg/dL (ref 70–99)
Potassium: 4.8 mmol/L (ref 3.5–5.2)
Sodium: 138 mmol/L (ref 134–144)
Total Protein: 7 g/dL (ref 6.0–8.5)
eGFR: 91 mL/min/{1.73_m2} (ref >59)

## 2022-08-20 LAB — CBC
Hematocrit: 40 % (ref 34.0–46.6)
Hemoglobin: 13.3 g/dL (ref 11.1–15.9)
MCH: 30.9 pg (ref 26.6–33.0)
MCHC: 33.3 g/dL (ref 31.5–35.7)
MCV: 93 fL (ref 79–97)
Platelets: 317 10*3/uL (ref 150–450)
RBC: 4.31 x10E6/uL (ref 3.77–5.28)
RDW: 12.2 % (ref 11.7–15.4)
WBC: 4.5 10*3/uL (ref 3.4–10.8)

## 2022-08-20 LAB — LIPID PANEL
Chol/HDL Ratio: 2.4 ratio (ref 0.0–4.4)
Cholesterol, Total: 171 mg/dL (ref 100–199)
HDL: 70 mg/dL (ref >39)
LDL Chol Calc (NIH): 81 mg/dL (ref 0–99)
Triglycerides: 117 mg/dL (ref 0–149)
VLDL Cholesterol Cal: 20 mg/dL (ref 5–40)

## 2022-08-20 LAB — VITAMIN D 25 HYDROXY (VIT D DEFICIENCY, FRACTURES): Vit D, 25-Hydroxy: 40.3 ng/mL (ref 30.0–100.0)

## 2022-08-20 LAB — TSH: TSH: 6.08 u[IU]/mL — ABNORMAL HIGH (ref 0.450–4.500)

## 2022-08-20 LAB — T4, FREE: Free T4: 1.3 ng/dL (ref 0.82–1.77)

## 2022-08-22 ENCOUNTER — Other Ambulatory Visit: Payer: Self-pay

## 2022-08-22 ENCOUNTER — Encounter: Payer: Self-pay | Admitting: Family Medicine

## 2022-08-22 NOTE — Progress Notes (Signed)
Zinc 50 mg was added to patient chart.

## 2022-08-30 ENCOUNTER — Ambulatory Visit: Payer: Medicare PPO

## 2022-08-30 DIAGNOSIS — M62830 Muscle spasm of back: Secondary | ICD-10-CM | POA: Diagnosis not present

## 2022-08-30 DIAGNOSIS — R293 Abnormal posture: Secondary | ICD-10-CM

## 2022-08-30 DIAGNOSIS — M5459 Other low back pain: Secondary | ICD-10-CM

## 2022-08-30 NOTE — Therapy (Signed)
OUTPATIENT PHYSICAL THERAPY THORACOLUMBAR TREATMENT   Patient Name: Marissa Perez MRN: [id number] DOB:28-Jul-1946, 77 y.o., female Today's Date: 08/30/2022  END OF SESSION:  PT End of Session - 08/30/22 1041     Visit Number 8    Number of Visits 12    Date for PT Re-Evaluation 08/18/22    Authorization Type FOTO AT LEAST EVERY 5TH VISIT.  PROGRESS NOTE AT 10TH VISIT.  KX MODIFIER AFTER 15 VISITS.    PT Start Time 1030    PT Stop Time 1034    PT Time Calculation (min) 4 min    Activity Tolerance Patient tolerated treatment well    Behavior During Therapy WFL for tasks assessed/performed            Past Medical History:  Diagnosis Date   Acid reflux    Allergy    Arthritis    Diverticulosis    GERD (gastroesophageal reflux disease)    History of ETT 7/10   abnormal    History of stomach ulcers 2008   positive h. pylori   Hyperlipidemia    Hypertension    Hypothyroidism    IBS (irritable bowel syndrome)    Interstitial cystitis    Skin cancer    basal and squamous   Past Surgical History:  Procedure Laterality Date   CESAREAN SECTION  1972   MOHS SURGERY     TONSILLECTOMY  1951   TOTAL ABDOMINAL HYSTERECTOMY W/ BILATERAL SALPINGOOPHORECTOMY  1990   Dr. Tamala Julian / fibroids    Patient Active Problem List   Diagnosis Date Noted   Osteoarthritis of first carpometacarpal joint of left hand 01/15/2022   Osteoarthritis of first carpometacarpal joint of right hand 01/15/2022   Pain in right knee 08/22/2019   Osteoarthritis of multiple joints 01/25/2019   Lumbar spondylosis 12/28/2017   Degeneration of lumbar intervertebral disc 12/13/2017   Aortic atherosclerosis ([place]) 09/22/2017   Family history of breast cancer 07/05/2016   Skin cancer 07/05/2016   Vitamin D deficiency 07/05/2016   Hypothyroidism 12/11/2012   Hyperlipidemia 12/11/2012   Hypertension 12/11/2012   GERD (gastroesophageal reflux disease) 12/11/2012   REFERRING PROVIDER: Benedetto Goad  NP  REFERRING DIAG: Lumbar spondylosis with myelopathy  Rationale for Evaluation and Treatment: Rehabilitation  THERAPY DIAG:  Other low back pain  Abnormal posture  Muscle spasm of back  ONSET DATE: Ongoing.  SUBJECTIVE:                                                                                                                                                                                           SUBJECTIVE STATEMENT: Pt reports minimal pain  today.  PERTINENT HISTORY:  H/o LBP, HTN hypothyroidism, OP.  PAIN:  Are you having pain? Yes: NPRS scale: 1-2/10 Pain location: Right low back. Pain description: As above. Aggravating factors: As above. Relieving factors: Ice sometimes.  PRECAUTIONS: OP  PATIENT GOALS: Not have pain like she is having and be able to do more.  NEXT MD VISIT:   OBJECTIVE: TODAY'S TREATMENT:                                                                                                                              DATE: 08/30/22.                                      EXERCISE LOG  Exercise Repetitions and Resistance Comments  NuStep  Lvl 4 x 15 mins        Blank cell = exercise not performed today                  Manual Therapy Soft Tissue Mobilization: Right lower back, STW/M to right upper glute and piriformis to decrease pain and tone with pt in left side-lying with pillow between her knees    Unattended Estim: Lumbar, IFC, 15 mins, Pain Combo: Lumbar, 1.5 w/cm2, 100%, 1 mhz, 10 mins, Pain  ASSESSMENT:  CLINICAL IMPRESSION: Pt arrives for today's treatment session reporting 1-2/10 low back pain.  Pt able to tolerate increased resistance on Nustep today with minimal discomfort.  STW/M performed to right low back, upper glute, and piriformis to decrease pain and tone with good results.  Normal responses to all modalities noted upon removal.  Pt denied any pain at completion of today's treatment session.  OBJECTIVE IMPAIRMENTS:  decreased activity tolerance, decreased ROM, increased muscle spasms, postural dysfunction, and pain.   ACTIVITY LIMITATIONS: carrying, lifting, bending, standing, locomotion level, and caring for others  PARTICIPATION LIMITATIONS: laundry  PERSONAL FACTORS: Time since onset of injury/illness/exacerbation are also affecting patient's functional outcome.   REHAB POTENTIAL: Good  CLINICAL DECISION MAKING: Stable/uncomplicated  EVALUATION COMPLEXITY: Low  GOALS:  LONG TERM GOALS: Target date: 08/18/22  Ind with a HEP. Goal status: MET  2.  Perform ADL's with pain not > 3/10.  Progress: 1/15: most days Goal status: IN PROGRESS  3.  Stand 20 minutes with pain not > 3/10. Progress: 1/15: 8-10 mins prior to 3/10 Goal status: IN PROGRESS  4.  Walk a community distance with pain not > 3/10. Progress: 1/15: depends on the day or activity level Goal status: IN PROGRESS  PLAN:  PT FREQUENCY: 3x/week  PT DURATION: 4 weeks  PLANNED INTERVENTIONS: Therapeutic exercises, Therapeutic activity, Patient/Family education, Self Care, Dry Needling, Electrical stimulation, Cryotherapy, Moist heat, Ultrasound, and Manual therapy.  PLAN FOR NEXT SESSION: Combo e'stim, [place], dry needling, core exercise progression, body mechanics training, spinal protection techniques.  Kathrynn Ducking, PTA 08/30/2022, 11:40 AM

## 2022-09-01 DIAGNOSIS — M9902 Segmental and somatic dysfunction of thoracic region: Secondary | ICD-10-CM | POA: Diagnosis not present

## 2022-09-01 DIAGNOSIS — M9903 Segmental and somatic dysfunction of lumbar region: Secondary | ICD-10-CM | POA: Diagnosis not present

## 2022-09-01 DIAGNOSIS — M62830 Muscle spasm of back: Secondary | ICD-10-CM | POA: Diagnosis not present

## 2022-09-01 DIAGNOSIS — M9901 Segmental and somatic dysfunction of cervical region: Secondary | ICD-10-CM | POA: Diagnosis not present

## 2022-09-03 ENCOUNTER — Ambulatory Visit: Payer: Medicare PPO | Attending: Family Medicine | Admitting: Physical Therapy

## 2022-09-03 ENCOUNTER — Encounter: Payer: Self-pay | Admitting: Physical Therapy

## 2022-09-03 DIAGNOSIS — R293 Abnormal posture: Secondary | ICD-10-CM | POA: Diagnosis not present

## 2022-09-03 DIAGNOSIS — M62830 Muscle spasm of back: Secondary | ICD-10-CM | POA: Diagnosis not present

## 2022-09-03 DIAGNOSIS — M5459 Other low back pain: Secondary | ICD-10-CM | POA: Insufficient documentation

## 2022-09-03 NOTE — Therapy (Signed)
OUTPATIENT PHYSICAL THERAPY THORACOLUMBAR TREATMENT   Patient Name: Marissa Perez MRN: [id number] DOB:11/29/1945, 77 y.o., female Today's Date: 09/03/2022  END OF SESSION:  PT End of Session - 09/03/22 1110     Visit Number 9    Number of Visits 12    Date for PT Re-Evaluation 08/18/22    Authorization Type FOTO AT LEAST EVERY 5TH VISIT.  PROGRESS NOTE AT 10TH VISIT.  KX MODIFIER AFTER 15 VISITS.    PT Start Time 1032    Activity Tolerance Patient tolerated treatment well    Behavior During Therapy WFL for tasks assessed/performed             Past Medical History:  Diagnosis Date   Acid reflux    Allergy    Arthritis    Diverticulosis    GERD (gastroesophageal reflux disease)    History of ETT 7/10   abnormal    History of stomach ulcers 2008   positive h. pylori   Hyperlipidemia    Hypertension    Hypothyroidism    IBS (irritable bowel syndrome)    Interstitial cystitis    Skin cancer    basal and squamous   Past Surgical History:  Procedure Laterality Date   CESAREAN SECTION  1972   MOHS SURGERY     TONSILLECTOMY  1951   TOTAL ABDOMINAL HYSTERECTOMY W/ BILATERAL SALPINGOOPHORECTOMY  1990   Dr. Tamala Julian / fibroids    Patient Active Problem List   Diagnosis Date Noted   Osteoarthritis of first carpometacarpal joint of left hand 01/15/2022   Osteoarthritis of first carpometacarpal joint of right hand 01/15/2022   Pain in right knee 08/22/2019   Osteoarthritis of multiple joints 01/25/2019   Lumbar spondylosis 12/28/2017   Degeneration of lumbar intervertebral disc 12/13/2017   Aortic atherosclerosis ([place]) 09/22/2017   Family history of breast cancer 07/05/2016   Skin cancer 07/05/2016   Vitamin D deficiency 07/05/2016   Hypothyroidism 12/11/2012   Hyperlipidemia 12/11/2012   Hypertension 12/11/2012   GERD (gastroesophageal reflux disease) 12/11/2012   REFERRING PROVIDER: Benedetto Goad NP  REFERRING DIAG: Lumbar spondylosis with  myelopathy  Rationale for Evaluation and Treatment: Rehabilitation  THERAPY DIAG:  Other low back pain  Abnormal posture  Muscle spasm of back  ONSET DATE: Ongoing.  SUBJECTIVE:                                                                                                                                                                                           SUBJECTIVE STATEMENT: Pt reports minimal pain today.  PERTINENT HISTORY:  H/o LBP, HTN hypothyroidism, OP.  PAIN:  Are you  having pain? Yes: NPRS scale: 2/10 Pain location: Right low back. Pain description: As above. Aggravating factors: As above. Relieving factors: Ice sometimes.  PRECAUTIONS: OP  PATIENT GOALS: Not have pain like she is having and be able to do more.  NEXT MD VISIT:   OBJECTIVE: TODAY'S TREATMENT:                                                                                                                              DATE: 08/30/22.                                      EXERCISE LOG  Exercise Repetitions and Resistance Comments  NuStep  Lvl 4 x 15 mins       Trigger Point Dry-Needling  Treatment instructions: Expect mild to moderate muscle soreness. S/S of pneumothorax if dry needled over a lung field, and to seek immediate medical attention should they occur. Patient verbalized understanding of these instructions and education.  Patient Consent Given: Yes Education handout provided: Yes Muscles treated: Right TFL/Glut med.        Manual Therapy Soft Tissue Mobilization: Right lower back, STW/M to right upper glute and piriformis to decrease pain and tone with pt in left side-lying with pillow between her knees x 13 minutes.   IFC at 80-150 Hz on 40% scan x 20 minutes with HMP.  ASSESSMENT:  CLINICAL IMPRESSION: Patient is pleased with her progress.  She tolerated dry needling without complaint to her right TFL and glut med.  Her pain has been consistently lower.  OBJECTIVE  IMPAIRMENTS: decreased activity tolerance, decreased ROM, increased muscle spasms, postural dysfunction, and pain.   ACTIVITY LIMITATIONS: carrying, lifting, bending, standing, locomotion level, and caring for others  PARTICIPATION LIMITATIONS: laundry  PERSONAL FACTORS: Time since onset of injury/illness/exacerbation are also affecting patient's functional outcome.   REHAB POTENTIAL: Good  CLINICAL DECISION MAKING: Stable/uncomplicated  EVALUATION COMPLEXITY: Low  GOALS:  LONG TERM GOALS: Target date: 08/18/22  Ind with a HEP. Goal status: MET  2.  Perform ADL's with pain not > 3/10.  Progress: 1/15: most days Goal status: IN PROGRESS  3.  Stand 20 minutes with pain not > 3/10. Progress: 1/15: 8-10 mins prior to 3/10 Goal status: IN PROGRESS  4.  Walk a community distance with pain not > 3/10. Progress: 1/15: depends on the day or activity level Goal status: IN PROGRESS  PLAN:  PT FREQUENCY: 3x/week  PT DURATION: 4 weeks  PLANNED INTERVENTIONS: Therapeutic exercises, Therapeutic activity, Patient/Family education, Self Care, Dry Needling, Electrical stimulation, Cryotherapy, Moist heat, Ultrasound, and Manual therapy.  PLAN FOR NEXT SESSION: Combo e'stim, [place], dry needling, core exercise progression, body mechanics training, spinal protection techniques.  Wally Behan, [place], PT 09/03/2022, 11:31 AM

## 2022-09-06 ENCOUNTER — Ambulatory Visit: Payer: Medicare PPO

## 2022-09-06 ENCOUNTER — Ambulatory Visit (INDEPENDENT_AMBULATORY_CARE_PROVIDER_SITE_OTHER): Payer: Medicare PPO

## 2022-09-06 DIAGNOSIS — Z780 Asymptomatic menopausal state: Secondary | ICD-10-CM | POA: Diagnosis not present

## 2022-09-06 DIAGNOSIS — R293 Abnormal posture: Secondary | ICD-10-CM | POA: Diagnosis not present

## 2022-09-06 DIAGNOSIS — M62830 Muscle spasm of back: Secondary | ICD-10-CM

## 2022-09-06 DIAGNOSIS — M8588 Other specified disorders of bone density and structure, other site: Secondary | ICD-10-CM

## 2022-09-06 DIAGNOSIS — M5459 Other low back pain: Secondary | ICD-10-CM

## 2022-09-06 DIAGNOSIS — M85832 Other specified disorders of bone density and structure, left forearm: Secondary | ICD-10-CM

## 2022-09-06 NOTE — Therapy (Addendum)
OUTPATIENT PHYSICAL THERAPY THORACOLUMBAR TREATMENT   Patient Name: Marissa Perez MRN: [id number] DOB:1945-12-05, 77 y.o., female Today's Date: 09/06/2022  END OF SESSION:  PT End of Session - 09/06/22 1037     Visit Number 10    Number of Visits 12    Date for PT Re-Evaluation 08/18/22    Authorization Type FOTO AT LEAST EVERY 5TH VISIT.  PROGRESS NOTE AT 10TH VISIT.  KX MODIFIER AFTER 15 VISITS.    PT Start Time 1031    Activity Tolerance Patient tolerated treatment well    Behavior During Therapy WFL for tasks assessed/performed             Past Medical History:  Diagnosis Date   Acid reflux    Allergy    Arthritis    Diverticulosis    GERD (gastroesophageal reflux disease)    History of ETT 7/10   abnormal    History of stomach ulcers 2008   positive h. pylori   Hyperlipidemia    Hypertension    Hypothyroidism    IBS (irritable bowel syndrome)    Interstitial cystitis    Skin cancer    basal and squamous   Past Surgical History:  Procedure Laterality Date   CESAREAN SECTION  1972   MOHS SURGERY     TONSILLECTOMY  1951   TOTAL ABDOMINAL HYSTERECTOMY W/ BILATERAL SALPINGOOPHORECTOMY  1990   Dr. Tamala Julian / fibroids    Patient Active Problem List   Diagnosis Date Noted   Osteoarthritis of first carpometacarpal joint of left hand 01/15/2022   Osteoarthritis of first carpometacarpal joint of right hand 01/15/2022   Pain in right knee 08/22/2019   Osteoarthritis of multiple joints 01/25/2019   Lumbar spondylosis 12/28/2017   Degeneration of lumbar intervertebral disc 12/13/2017   Aortic atherosclerosis ([place]) 09/22/2017   Family history of breast cancer 07/05/2016   Skin cancer 07/05/2016   Vitamin D deficiency 07/05/2016   Hypothyroidism 12/11/2012   Hyperlipidemia 12/11/2012   Hypertension 12/11/2012   GERD (gastroesophageal reflux disease) 12/11/2012   REFERRING PROVIDER: Benedetto Goad NP  REFERRING DIAG: Lumbar spondylosis with  myelopathy  Rationale for Evaluation and Treatment: Rehabilitation  THERAPY DIAG:  Other low back pain  Abnormal posture  Muscle spasm of back  ONSET DATE: Ongoing.  SUBJECTIVE:                                                                                                                                                                                           SUBJECTIVE STATEMENT: Pt reports mild increase in pain today.  Pt reports having stomach issues this morning and attributes some pain  to that.  Found dry needling effective.  PERTINENT HISTORY:  H/o LBP, HTN hypothyroidism, OP.  PAIN:  Are you having pain? Yes: NPRS scale: 3-4/10 Pain location: Right low back. Pain description: As above. Aggravating factors: As above. Relieving factors: Ice sometimes.  PRECAUTIONS: OP  PATIENT GOALS: Not have pain like she is having and be able to do more.  NEXT MD VISIT:   OBJECTIVE: TODAY'S TREATMENT:                                                                                                                              DATE: 09/06/22.                                      EXERCISE LOG  Exercise Repetitions and Resistance Comments  NuStep  Lvl 4 x 15 mins       Trigger Point Dry-Needling  Treatment instructions: Expect mild to moderate muscle soreness. S/S of pneumothorax if dry needled over a lung field, and to seek immediate medical attention should they occur. Patient verbalized understanding of these instructions and education.  Patient Consent Given: Yes Education handout provided: Yes Muscles treated: Right TFL/Glut med.  Marissa Perez MPT  09/06/22        Manual Therapy Soft Tissue Mobilization: Right lower back, STW/M to right upper glute and piriformis to decrease pain and tone with pt in left side-lying with pillow between her knees  Modalities  Date:  Unattended Estim: Lumbar, IFC 80-150 Hz , 15 mins, Pain and Tone Ultrasound: Lumbar, 1.5 w/cm2 , 8  mins, Pain   ASSESSMENT:  CLINICAL IMPRESSION: Pt arrives for today's treatment session reporting 3-4/10 low back pain.  Pt able to increase FOTO score to 56 today and reports that since beginning therapy she is having more good days than bad days.  Pt is making progress towards her goals, but her pain is directly related to the activities she performs that day.  STW/M performed to right piriformis, lumbar paraspinals, and QL to decrease pain and tone.  Normal responses to all modalities noted upon removal.  Pt reported 1/10 low back pain at completion of today's treatment session.   OBJECTIVE IMPAIRMENTS: decreased activity tolerance, decreased ROM, increased muscle spasms, postural dysfunction, and pain.   ACTIVITY LIMITATIONS: carrying, lifting, bending, standing, locomotion level, and caring for others  PARTICIPATION LIMITATIONS: laundry  PERSONAL FACTORS: Time since onset of injury/illness/exacerbation are also affecting patient's functional outcome.   REHAB POTENTIAL: Good  CLINICAL DECISION MAKING: Stable/uncomplicated  EVALUATION COMPLEXITY: Low  GOALS:  LONG TERM GOALS: Target date: 08/18/22  Ind with a HEP. Goal status: MET  2.  Perform ADL's with pain not > 3/10.  Progress: 1/15: most days Goal status: IN PROGRESS  3.  Stand 20 minutes with pain not > 3/10. Progress: 1/15: 8-10 mins prior to 3/10; 2/5: 15 mins Goal  status: IN PROGRESS  4.  Walk a community distance with pain not > 3/10. Progress: 1/15: depends on the day or activity level Goal status: IN PROGRESS  PLAN:  PT FREQUENCY: 3x/week  PT DURATION: 4 weeks  PLANNED INTERVENTIONS: Therapeutic exercises, Therapeutic activity, Patient/Family education, Self Care, Dry Needling, Electrical stimulation, Cryotherapy, Moist heat, Ultrasound, and Manual therapy.  PLAN FOR NEXT SESSION: Combo e'stim, [place], dry needling, core exercise progression, body mechanics training, spinal protection techniques.  Marissa Perez, PTA 09/06/2022, 11:35 AM   Progress Note Reporting Period 07/21/22 to 09/06/22  See note below for Objective Data and Assessment of Progress/Goals. Excellent progress toward goals and very good response to dry needling.  Patient's pain is been consistently lowered with more good than bad days now.    Marissa Perez MPT

## 2022-09-10 ENCOUNTER — Ambulatory Visit: Payer: Medicare PPO | Admitting: Physical Therapy

## 2022-09-10 DIAGNOSIS — M62830 Muscle spasm of back: Secondary | ICD-10-CM

## 2022-09-10 DIAGNOSIS — R293 Abnormal posture: Secondary | ICD-10-CM | POA: Diagnosis not present

## 2022-09-10 DIAGNOSIS — M5459 Other low back pain: Secondary | ICD-10-CM

## 2022-09-10 NOTE — Therapy (Signed)
OUTPATIENT PHYSICAL THERAPY THORACOLUMBAR TREATMENT   Patient Name: Marissa Perez MRN: [id number] DOB:02/28/1946, 77 y.o., female Today's Date: 09/10/2022  END OF SESSION:  PT End of Session - 09/10/22 1211     Visit Number 11    Number of Visits 12    Date for PT Re-Evaluation 08/18/22    Authorization Type FOTO AT LEAST EVERY 5TH VISIT.  PROGRESS NOTE AT 10TH VISIT.  KX MODIFIER AFTER 15 VISITS.    PT Start Time [phone]    PT Stop Time 1042    PT Time Calculation (min) 56 min    Activity Tolerance Patient tolerated treatment well    Behavior During Therapy WFL for tasks assessed/performed             Past Medical History:  Diagnosis Date   Acid reflux    Allergy    Arthritis    Diverticulosis    GERD (gastroesophageal reflux disease)    History of ETT 7/10   abnormal    History of stomach ulcers 2008   positive h. pylori   Hyperlipidemia    Hypertension    Hypothyroidism    IBS (irritable bowel syndrome)    Interstitial cystitis    Skin cancer    basal and squamous   Past Surgical History:  Procedure Laterality Date   CESAREAN SECTION  1972   MOHS SURGERY     TONSILLECTOMY  1951   TOTAL ABDOMINAL HYSTERECTOMY W/ BILATERAL SALPINGOOPHORECTOMY  1990   Dr. Tamala Julian / fibroids    Patient Active Problem List   Diagnosis Date Noted   Osteoarthritis of first carpometacarpal joint of left hand 01/15/2022   Osteoarthritis of first carpometacarpal joint of right hand 01/15/2022   Pain in right knee 08/22/2019   Osteoarthritis of multiple joints 01/25/2019   Lumbar spondylosis 12/28/2017   Degeneration of lumbar intervertebral disc 12/13/2017   Aortic atherosclerosis ([place]) 09/22/2017   Family history of breast cancer 07/05/2016   Skin cancer 07/05/2016   Vitamin D deficiency 07/05/2016   Hypothyroidism 12/11/2012   Hyperlipidemia 12/11/2012   Hypertension 12/11/2012   GERD (gastroesophageal reflux disease) 12/11/2012   REFERRING PROVIDER: Benedetto Goad  NP  REFERRING DIAG: Lumbar spondylosis with myelopathy  Rationale for Evaluation and Treatment: Rehabilitation  THERAPY DIAG:  Other low back pain  Abnormal posture  Muscle spasm of back  ONSET DATE: Ongoing.  SUBJECTIVE:                                                                                                                                                                                           SUBJECTIVE STATEMENT: Patient's pain at  a "solid 4" today which she feels is related to getting up from a seated position and moving too quickly.  PERTINENT HISTORY:  H/o LBP, HTN hypothyroidism, OP.  PAIN:  Are you having pain? Yes: NPRS scale: 4/10 Pain location: Right low back. Pain description: As above. Aggravating factors: As above. Relieving factors: Ice sometimes.  PRECAUTIONS: OP  PATIENT GOALS: Not have pain like she is having and be able to do more.  NEXT MD VISIT:   OBJECTIVE: TODAY'S TREATMENT:                                                                                                                              DATE: 09/10/22.                                      EXERCISE LOG  Exercise Repetitions and Resistance Comments  NuStep  Lvl 3 x 10 mins       Left sdly position with folded pillow between knees for comfort:  Combo e'stim/US at 1.50 W/CM2 x 10 minutes to right SIJ/gluteal region f/b STW/M x 10 minutes f/b HMP and IFC at 80-150 Hz on 40% scan x 20 minutes with patient in supine with wedge under knees.   ASSESSMENT:  CLINICAL IMPRESSION: Patient with flare-up today which thinks is likely related to getting up from a seated position and moving too quickly.  Tender to palpation over right Piriformis region.  Patient tolerated treatment without complaint with normal modality response following removal of modality.     OBJECTIVE IMPAIRMENTS: decreased activity tolerance, decreased ROM, increased muscle spasms, postural dysfunction, and pain.    ACTIVITY LIMITATIONS: carrying, lifting, bending, standing, locomotion level, and caring for others  PARTICIPATION LIMITATIONS: laundry  PERSONAL FACTORS: Time since onset of injury/illness/exacerbation are also affecting patient's functional outcome.   REHAB POTENTIAL: Good  CLINICAL DECISION MAKING: Stable/uncomplicated  EVALUATION COMPLEXITY: Low  GOALS:  LONG TERM GOALS: Target date: 08/18/22  Ind with a HEP. Goal status: MET  2.  Perform ADL's with pain not > 3/10.  Progress: 1/15: most days Goal status: IN PROGRESS  3.  Stand 20 minutes with pain not > 3/10. Progress: 1/15: 8-10 mins prior to 3/10; 2/5: 15 mins Goal status: IN PROGRESS  4.  Walk a community distance with pain not > 3/10. Progress: 1/15: depends on the day or activity level Goal status: IN PROGRESS  PLAN:  PT FREQUENCY: 3x/week  PT DURATION: 4 weeks  PLANNED INTERVENTIONS: Therapeutic exercises, Therapeutic activity, Patient/Family education, Self Care, Dry Needling, Electrical stimulation, Cryotherapy, Moist heat, Ultrasound, and Manual therapy.  PLAN FOR NEXT SESSION: Combo e'stim, [place], dry needling, core exercise progression, body mechanics training, spinal protection techniques.  Dandy Lazaro, [place], PT 09/10/2022, 12:32 PM   Progress Note Reporting Period 07/21/22 to 09/06/22  See note below for Objective Data and Assessment of Progress/Goals. Excellent progress  toward goals and very good response to dry needling.  Patient's pain is been consistently lowered with more good than bad days now.    Mali Avari Gelles MPT

## 2022-09-14 ENCOUNTER — Encounter: Payer: Self-pay | Admitting: *Deleted

## 2022-09-14 ENCOUNTER — Ambulatory Visit: Payer: Medicare PPO | Admitting: *Deleted

## 2022-09-14 DIAGNOSIS — M5459 Other low back pain: Secondary | ICD-10-CM | POA: Diagnosis not present

## 2022-09-14 DIAGNOSIS — M62830 Muscle spasm of back: Secondary | ICD-10-CM | POA: Diagnosis not present

## 2022-09-14 DIAGNOSIS — R293 Abnormal posture: Secondary | ICD-10-CM

## 2022-09-14 NOTE — Therapy (Signed)
OUTPATIENT PHYSICAL THERAPY THORACOLUMBAR TREATMENT   Patient Name: Marissa Perez MRN: [id number] DOB:1946/07/11, 77 y.o., female Today's Date: 09/14/2022  END OF SESSION:  PT End of Session - 09/14/22 1345     Visit Number 12    Number of Visits 12    Date for PT Re-Evaluation 08/18/22    Authorization Type FOTO AT LEAST EVERY 5TH VISIT.  PROGRESS NOTE AT 10TH VISIT.  KX MODIFIER AFTER 15 VISITS.    PT Start Time [id number]    PT Stop Time 1435    PT Time Calculation (min) 50 min    Equipment Utilized During Treatment Cervical collar             Past Medical History:  Diagnosis Date   Acid reflux    Allergy    Arthritis    Diverticulosis    GERD (gastroesophageal reflux disease)    History of ETT 7/10   abnormal    History of stomach ulcers 2008   positive h. pylori   Hyperlipidemia    Hypertension    Hypothyroidism    IBS (irritable bowel syndrome)    Interstitial cystitis    Skin cancer    basal and squamous   Past Surgical History:  Procedure Laterality Date   CESAREAN SECTION  1972   MOHS SURGERY     TONSILLECTOMY  1951   TOTAL ABDOMINAL HYSTERECTOMY W/ BILATERAL SALPINGOOPHORECTOMY  1990   Dr. Tamala Julian / fibroids    Patient Active Problem List   Diagnosis Date Noted   Osteoarthritis of first carpometacarpal joint of left hand 01/15/2022   Osteoarthritis of first carpometacarpal joint of right hand 01/15/2022   Pain in right knee 08/22/2019   Osteoarthritis of multiple joints 01/25/2019   Lumbar spondylosis 12/28/2017   Degeneration of lumbar intervertebral disc 12/13/2017   Aortic atherosclerosis ([place]) 09/22/2017   Family history of breast cancer 07/05/2016   Skin cancer 07/05/2016   Vitamin D deficiency 07/05/2016   Hypothyroidism 12/11/2012   Hyperlipidemia 12/11/2012   Hypertension 12/11/2012   GERD (gastroesophageal reflux disease) 12/11/2012   REFERRING PROVIDER: Benedetto Goad NP  REFERRING DIAG: Lumbar spondylosis with  myelopathy  Rationale for Evaluation and Treatment: Rehabilitation  THERAPY DIAG:  Other low back pain  Abnormal posture  Muscle spasm of back  ONSET DATE: Ongoing.  SUBJECTIVE:                                                                                                                                                                                           SUBJECTIVE STATEMENT: Patient's pain at at 4/10 today.  PERTINENT HISTORY:  H/o LBP, HTN  hypothyroidism, OP.  PAIN:  Are you having pain? Yes: NPRS scale: 4/10 Pain location: Right low back. Pain description: As above. Aggravating factors: As above. Relieving factors: Ice sometimes.  PRECAUTIONS: OP  PATIENT GOALS: Not have pain like she is having and be able to do more.  NEXT MD VISIT:   OBJECTIVE: TODAY'S TREATMENT:                                                                                                                              DATE: 09/10/22.                                      EXERCISE LOG  Exercise Repetitions and Resistance Comments  NuStep  Lvl 3 x 15 mins   Bridges x10   Dying Bug X6 hold 10 secs each side   AB bracing x 5 hold 5    BODY MECHANICS: Discussed  movement patterns for ADL's and ways to modify to decrease pain triggers.     HMP and IFC at 80-150 Hz on 40% scan x 15 minutes with patient in supine with wedge under knees.   ASSESSMENT:  CLINICAL IMPRESSION: Recert. Pt arrived today doing some better after last Rx. Today, Rx focused on movement patterns for ADL's AND TRANSITIONAL MOVEMENTS TO DECREASE PAIN TRIGGERS AS WELL AS CORE ACTIVATION EXS. IFC END OF SESSION TOLERATED WELL    OBJECTIVE IMPAIRMENTS: decreased activity tolerance, decreased ROM, increased muscle spasms, postural dysfunction, and pain.   ACTIVITY LIMITATIONS: carrying, lifting, bending, standing, locomotion level, and caring for others  PARTICIPATION LIMITATIONS: laundry  PERSONAL FACTORS: Time since  onset of injury/illness/exacerbation are also affecting patient's functional outcome.   REHAB POTENTIAL: Good  CLINICAL DECISION MAKING: Stable/uncomplicated  EVALUATION COMPLEXITY: Low  GOALS:  LONG TERM GOALS: Target date: 08/18/22  Ind with a HEP. Goal status: MET  2.  Perform ADL's with pain not > 3/10.  Progress: 1/15: most days Goal status: IN PROGRESS  3.  Stand 20 minutes with pain not > 3/10. Progress: 1/15: 8-10 mins prior to 3/10; 2/5: 15 mins Goal status: IN PROGRESS  4.  Walk a community distance with pain not > 3/10. Progress: 1/15: depends on the day or activity level Goal status: IN PROGRESS  PLAN:  PT FREQUENCY: 3x/week  PT DURATION: 4 weeks  PLANNED INTERVENTIONS: Therapeutic exercises, Therapeutic activity, Patient/Family education, Self Care, Dry Needling, Electrical stimulation, Cryotherapy, Moist heat, Ultrasound, and Manual therapy.  PLAN FOR NEXT SESSION: Combo e'stim, [place], dry needling, core exercise progression, body mechanics training, spinal protection techniques.  Maryjayne Kleven,CHRIS, PTA 09/14/2022, 3:28 PM   Progress Note Reporting Period 07/21/22 to 09/06/22  See note below for Objective Data and Assessment of Progress/Goals. Excellent progress toward goals and very good response to dry needling.  Patient's pain is been consistently lowered with more good than bad days now.  Mali Applegate MPT

## 2022-09-15 DIAGNOSIS — M9903 Segmental and somatic dysfunction of lumbar region: Secondary | ICD-10-CM | POA: Diagnosis not present

## 2022-09-15 DIAGNOSIS — M62830 Muscle spasm of back: Secondary | ICD-10-CM | POA: Diagnosis not present

## 2022-09-15 DIAGNOSIS — M9902 Segmental and somatic dysfunction of thoracic region: Secondary | ICD-10-CM | POA: Diagnosis not present

## 2022-09-15 DIAGNOSIS — M9901 Segmental and somatic dysfunction of cervical region: Secondary | ICD-10-CM | POA: Diagnosis not present

## 2022-09-22 NOTE — Addendum Note (Signed)
Addended by: Cranston Koors, [place] W on: 09/22/2022 01:42 PM   Modules accepted: Orders

## 2022-09-23 ENCOUNTER — Encounter: Payer: Self-pay | Admitting: *Deleted

## 2022-09-23 ENCOUNTER — Ambulatory Visit: Payer: Medicare PPO | Admitting: *Deleted

## 2022-09-23 DIAGNOSIS — M62830 Muscle spasm of back: Secondary | ICD-10-CM | POA: Diagnosis not present

## 2022-09-23 DIAGNOSIS — R293 Abnormal posture: Secondary | ICD-10-CM | POA: Diagnosis not present

## 2022-09-23 DIAGNOSIS — M5459 Other low back pain: Secondary | ICD-10-CM | POA: Diagnosis not present

## 2022-09-23 NOTE — Therapy (Signed)
OUTPATIENT PHYSICAL THERAPY THORACOLUMBAR TREATMENT   Patient Name: Marissa Perez MRN: [id number] DOB:[birth year], 77 y.o., female Today's Date: 09/23/2022  END OF SESSION:  PT End of Session - 09/23/22 1038     Visit Number 13    Number of Visits 18    Date for PT Re-Evaluation 08/18/22    Authorization Type FOTO AT LEAST EVERY 5TH VISIT.  PROGRESS NOTE AT 10TH VISIT.  KX MODIFIER AFTER 15 VISITS.    PT Start Time 1030    PT Stop Time 1120    PT Time Calculation (min) 50 min             Past Medical History:  Diagnosis Date   Acid reflux    Allergy    Arthritis    Diverticulosis    GERD (gastroesophageal reflux disease)    History of ETT 7/10   abnormal    History of stomach ulcers 2008   positive h. pylori   Hyperlipidemia    Hypertension    Hypothyroidism    IBS (irritable bowel syndrome)    Interstitial cystitis    Skin cancer    basal and squamous   Past Surgical History:  Procedure Laterality Date   CESAREAN SECTION  1972   MOHS SURGERY     TONSILLECTOMY  1951   TOTAL ABDOMINAL HYSTERECTOMY W/ BILATERAL SALPINGOOPHORECTOMY  1990   Dr. Tamala Julian / fibroids    Patient Active Problem List   Diagnosis Date Noted   Osteoarthritis of first carpometacarpal joint of left hand 01/15/2022   Osteoarthritis of first carpometacarpal joint of right hand 01/15/2022   Pain in right knee 08/22/2019   Osteoarthritis of multiple joints 01/25/2019   Lumbar spondylosis 12/28/2017   Degeneration of lumbar intervertebral disc 12/13/2017   Aortic atherosclerosis ([place]) 09/22/2017   Family history of breast cancer 07/05/2016   Skin cancer 07/05/2016   Vitamin D deficiency 07/05/2016   Hypothyroidism 12/11/2012   Hyperlipidemia 12/11/2012   Hypertension 12/11/2012   GERD (gastroesophageal reflux disease) 12/11/2012   REFERRING PROVIDER: Benedetto Goad NP  REFERRING DIAG: Lumbar spondylosis with myelopathy  Rationale for Evaluation and Treatment:  Rehabilitation  THERAPY DIAG:  Other low back pain  Abnormal posture  Muscle spasm of back  ONSET DATE: Ongoing.  SUBJECTIVE:                                                                                                                                                                                           SUBJECTIVE STATEMENT: Patient's pain at at 4/10 today.  PERTINENT HISTORY:  H/o LBP, HTN hypothyroidism, OP.  PAIN:  Are you having pain?  Yes: NPRS scale: 4/10 Pain location: Right low back. Pain description: As above. Aggravating factors: As above. Relieving factors: Ice sometimes.  PRECAUTIONS: OP  PATIENT GOALS: Not have pain like she is having and be able to do more.  NEXT MD VISIT:   OBJECTIVE: TODAY'S TREATMENT:                                                                                                                              DATE:                                                                    09/23/22.                                      EXERCISE LOG  Exercise Repetitions and Resistance Comments  NuStep  Lvl 3 x 15 mins   Bridges    Dying Bug    AB bracing  x 5 hold 5   Standing bird-dog Arm raise x6 hold, leg raise x 6 hold 5 each   BODY MECHANICS: Discussed  and reviewed movement patterns for ADL's and ways to modify to decrease pain triggers.     HMP and IFC at 80-150 Hz on 40% scan x 15 minutes with patient in supine with wedge under knees.   ASSESSMENT:  CLINICAL IMPRESSION: Pt arrived today doing fairly well and reports doing okay after last visit and has been implementing movement modifications with ADL's. Standing modified bird- dog arm and then leg raise was added to HEP and tolerated well. IFC to LB end of session.   OBJECTIVE IMPAIRMENTS: decreased activity tolerance, decreased ROM, increased muscle spasms, postural dysfunction, and pain.   ACTIVITY LIMITATIONS: carrying, lifting, bending, standing, locomotion level, and  caring for others  PARTICIPATION LIMITATIONS: laundry  PERSONAL FACTORS: Time since onset of injury/illness/exacerbation are also affecting patient's functional outcome.   REHAB POTENTIAL: Good  CLINICAL DECISION MAKING: Stable/uncomplicated  EVALUATION COMPLEXITY: Low  GOALS:  LONG TERM GOALS: Target date: 08/18/22  Ind with a HEP. Goal status: MET  2.  Perform ADL's with pain not > 3/10.  Progress: 1/15: most days Goal status: IN PROGRESS  3.  Stand 20 minutes with pain not > 3/10. Progress: 1/15: 8-10 mins prior to 3/10; 2/5: 15 mins Goal status: IN PROGRESS  4.  Walk a community distance with pain not > 3/10. Progress: 1/15: depends on the day or activity level Goal status: IN PROGRESS  PLAN:  PT FREQUENCY: 3x/week  PT DURATION: 4 weeks  PLANNED INTERVENTIONS: Therapeutic exercises, Therapeutic activity, Patient/Family education, Self Care, Dry Needling, Electrical stimulation, Cryotherapy, Moist heat,  Ultrasound, and Manual therapy.  PLAN FOR NEXT SESSION: Combo e'stim, [place], dry needling, core exercise progression, body mechanics training, spinal protection techniques.  Michiah Mudry,CHRIS, PTA 09/23/2022, 12:06 PM

## 2022-09-30 ENCOUNTER — Encounter: Payer: Self-pay | Admitting: *Deleted

## 2022-09-30 ENCOUNTER — Ambulatory Visit: Payer: Medicare PPO | Admitting: *Deleted

## 2022-09-30 DIAGNOSIS — M62830 Muscle spasm of back: Secondary | ICD-10-CM

## 2022-09-30 DIAGNOSIS — R293 Abnormal posture: Secondary | ICD-10-CM

## 2022-09-30 DIAGNOSIS — M9901 Segmental and somatic dysfunction of cervical region: Secondary | ICD-10-CM | POA: Diagnosis not present

## 2022-09-30 DIAGNOSIS — M5459 Other low back pain: Secondary | ICD-10-CM

## 2022-09-30 DIAGNOSIS — M9902 Segmental and somatic dysfunction of thoracic region: Secondary | ICD-10-CM | POA: Diagnosis not present

## 2022-09-30 DIAGNOSIS — M9903 Segmental and somatic dysfunction of lumbar region: Secondary | ICD-10-CM | POA: Diagnosis not present

## 2022-09-30 NOTE — Therapy (Signed)
OUTPATIENT PHYSICAL THERAPY THORACOLUMBAR TREATMENT   Patient Name: Marissa Perez MRN: [id number] DOB:1946/07/26, 77 y.o., female Today's Date: 09/30/2022  END OF SESSION:  PT End of Session - 09/30/22 1033     Visit Number 14    Number of Visits 18    Date for PT Re-Evaluation 09/15/22    Authorization Type FOTO AT LEAST EVERY 5TH VISIT.  PROGRESS NOTE AT 10TH VISIT.  KX MODIFIER AFTER 15 VISITS.    PT Start Time 1030    PT Stop Time 1120    PT Time Calculation (min) 50 min             Past Medical History:  Diagnosis Date   Acid reflux    Allergy    Arthritis    Diverticulosis    GERD (gastroesophageal reflux disease)    History of ETT 7/10   abnormal    History of stomach ulcers 2008   positive h. pylori   Hyperlipidemia    Hypertension    Hypothyroidism    IBS (irritable bowel syndrome)    Interstitial cystitis    Skin cancer    basal and squamous   Past Surgical History:  Procedure Laterality Date   CESAREAN SECTION  1972   MOHS SURGERY     TONSILLECTOMY  1951   TOTAL ABDOMINAL HYSTERECTOMY W/ BILATERAL SALPINGOOPHORECTOMY  1990   Dr. Tamala Julian / fibroids    Patient Active Problem List   Diagnosis Date Noted   Osteoarthritis of first carpometacarpal joint of left hand 01/15/2022   Osteoarthritis of first carpometacarpal joint of right hand 01/15/2022   Pain in right knee 08/22/2019   Osteoarthritis of multiple joints 01/25/2019   Lumbar spondylosis 12/28/2017   Degeneration of lumbar intervertebral disc 12/13/2017   Aortic atherosclerosis ([place]) 09/22/2017   Family history of breast cancer 07/05/2016   Skin cancer 07/05/2016   Vitamin D deficiency 07/05/2016   Hypothyroidism 12/11/2012   Hyperlipidemia 12/11/2012   Hypertension 12/11/2012   GERD (gastroesophageal reflux disease) 12/11/2012   REFERRING PROVIDER: Benedetto Goad NP  REFERRING DIAG: Lumbar spondylosis with myelopathy  Rationale for Evaluation and Treatment:  Rehabilitation  THERAPY DIAG:  Other low back pain  Abnormal posture  Muscle spasm of back  ONSET DATE: Ongoing.  SUBJECTIVE:                                                                                                                                                                                           SUBJECTIVE STATEMENT: Patient's pain at at 3/10 today. After riding a long time yesterday my RT leg really hurt and was hard to move .  PERTINENT HISTORY:  H/o LBP, HTN hypothyroidism, OP.  PAIN:  Are you having pain? Yes: NPRS scale: 4/10 Pain location: Right low back. Pain description: As above. Aggravating factors: As above. Relieving factors: Ice sometimes.  PRECAUTIONS: OP  PATIENT GOALS: Not have pain like she is having and be able to do more.  NEXT MD VISIT:   OBJECTIVE: TODAY'S TREATMENT:                                                                                                                              DATE:                                                                    09/30/22.                                      EXERCISE LOG  Exercise Repetitions and Resistance Comments  NuStep  Lvl 3 x 15 mins arms 7 seat 6   Seated  Dying bug X6 each side hold 3-5 secs Sheet given      AB bracing  x 5 hold 5   Standing bird-dog Arm raise / leg raise x 6 hold 5 each   BODY MECHANICS:      HMP and IFC at 80-150 Hz on 40% scan x 15 minutes with patient in supine with wedge under knees.   ASSESSMENT:  CLINICAL IMPRESSION: Pt arrived today doing fairly well and reports having some increased RT LE pain after driving for 2.5 hrs and not stopping . Rx focused again on core strengthening and mobility. She did well with seated dying bug and standing bird-dog. IFC to LB paras end of session tolerated well.     OBJECTIVE IMPAIRMENTS: decreased activity tolerance, decreased ROM, increased muscle spasms, postural dysfunction, and pain.   ACTIVITY  LIMITATIONS: carrying, lifting, bending, standing, locomotion level, and caring for others  PARTICIPATION LIMITATIONS: laundry  PERSONAL FACTORS: Time since onset of injury/illness/exacerbation are also affecting patient's functional outcome.   REHAB POTENTIAL: Good  CLINICAL DECISION MAKING: Stable/uncomplicated  EVALUATION COMPLEXITY: Low  GOALS:  LONG TERM GOALS: Target date: 08/18/22  Ind with a HEP. Goal status: MET  2.  Perform ADL's with pain not > 3/10.  Progress: 1/15: most days Goal status: IN PROGRESS  3.  Stand 20 minutes with pain not > 3/10. Progress: 1/15: 8-10 mins prior to 3/10; 2/5: 15 mins Goal status: IN PROGRESS  4.  Walk a community distance with pain not > 3/10. Progress: 1/15: depends on the day or activity level Goal status: IN PROGRESS  PLAN:  PT FREQUENCY: 3x/week  PT DURATION:  4 weeks  PLANNED INTERVENTIONS: Therapeutic exercises, Therapeutic activity, Patient/Family education, Self Care, Dry Needling, Electrical stimulation, Cryotherapy, Moist heat, Ultrasound, and Manual therapy.  PLAN FOR NEXT SESSION: Combo e'stim, [place], dry needling, core exercise progression, body mechanics training, spinal protection techniques.  Katrese Shell,CHRIS, PTA 09/30/2022, 5:49 PM

## 2022-10-01 ENCOUNTER — Other Ambulatory Visit: Payer: Self-pay | Admitting: Family Medicine

## 2022-10-01 ENCOUNTER — Ambulatory Visit: Payer: Medicare PPO | Admitting: Gastroenterology

## 2022-10-01 ENCOUNTER — Encounter: Payer: Self-pay | Admitting: Gastroenterology

## 2022-10-01 VITALS — BP 130/92 | HR 48 | Ht 62.0 in | Wt 137.5 lb

## 2022-10-01 DIAGNOSIS — R7989 Other specified abnormal findings of blood chemistry: Secondary | ICD-10-CM

## 2022-10-01 DIAGNOSIS — K5900 Constipation, unspecified: Secondary | ICD-10-CM

## 2022-10-01 NOTE — Patient Instructions (Addendum)
Follow up as needed  __________________________________________  If your blood pressure at your visit was 140/90 or greater, please contact your primary care physician to follow up on this.  _______________________________________________________  If you are age 77 or older, your body mass index should be between 23-30. Your Body mass index is 25.15 kg/m. If this is out of the aforementioned range listed, please consider follow up with your Primary Care Provider.  If you are age 69 or younger, your body mass index should be between 19-25. Your Body mass index is 25.15 kg/m. If this is out of the aformentioned range listed, please consider follow up with your Primary Care Provider.   ________________________________________________________  The [facility] providers would like to encourage you to use [facility] to communicate with providers for non-urgent requests or questions.  Due to long hold times on the telephone, sending your provider a message by [facility] may be a faster and more efficient way to get a response.  Please allow 48 business hours for a response.  Please remember that this is for non-urgent requests.  _______________________________________________________  Low FODMAP Diet: (Fermentable Oligosaccharides, Disaccharides, Monosaccharides, and Polyols) These are short chain carbohydrates and sugar alcohols that are poorly absorbed by the body, resulting in multiple abdominal symptoms, including changes in bowel habits, abdominal pain/discomfort, bloating, abdominal distension, gas, etc.

## 2022-10-01 NOTE — Progress Notes (Signed)
Chief Complaint:    Elevated liver enzymes, constipation  Endoscopic History: - 10/10/2013 colonoscopy with mild diverticulosis in the sigmoid colon otherwise normal. Repeat recommended in 10 years.  - 09/19/2020: EGD (for epigastric pain, heartburn): Mildly irregular Z-line with single small island of salmon-colored mucosa (path: Chronic inflammation without metaplasia), Gastric inlet patch, mild non-H. pylori gastritis.  Fundic gland polyps.  Nodular peptic duodenitis  HPI:     Patient is a 77 y.o. female presenting [facility] for evaluation of elevated liver enzymes and constipation/change in bowel habits.  Was having constipation, and started Linzess 72 mcg with improvement but stopped due to diarrhea (took for a few months). Changed to Amitiza, and not as efficacious. Now back to dietary mods alone- increased water, fiber foods, prunes with overall improvement.  Will have constipation now occasionally.   Additionally, she is concerned about recent labs showing very mildly elevated ALT at 37.  Labs reviewed, and has been similarly mildly elevated at times since at least 2014.  Otherwise normal remainder of CMP along with normal CBC.     Latest Ref Rng & Units 08/19/2022    8:05 AM 07/05/2022   12:12 PM 02/17/2022   10:36 AM  CMP  Glucose 70 - 99 mg/dL 96  93  88   BUN 8 - 27 mg/dL '10  14  7   '$ Creatinine 0.57 - 1.00 mg/dL 0.65  0.67  0.61   Sodium 134 - 144 mmol/L 138  141  131   Potassium 3.5 - 5.2 mmol/L 4.8  5.2  4.7   Chloride 96 - 106 mmol/L 98  102  93   CO2 20 - 29 mmol/L '25  22  22   '$ Calcium 8.7 - 10.3 mg/dL 9.7  9.7  9.5   Total Protein 6.0 - 8.5 g/dL 7.0   7.2   Total Bilirubin 0.0 - 1.2 mg/dL 0.3   0.5   Alkaline Phos 44 - 121 IU/L 90   68   AST 0 - 40 IU/L 29   33   ALT 0 - 32 IU/L 37   34      Review of systems:     No chest pain, no SOB, no fevers, no urinary sx   Past Medical History:  Diagnosis Date   Acid reflux    Allergy    Arthritis     Diverticulosis    GERD (gastroesophageal reflux disease)    History of ETT 7/10   abnormal    History of stomach ulcers 2008   positive h. pylori   Hyperlipidemia    Hypertension    Hypothyroidism    IBS (irritable bowel syndrome)    Interstitial cystitis    Skin cancer    basal and squamous    Patient's surgical history, family medical history, social history, medications and allergies were all reviewed in Epic    Current Outpatient Medications  Medication Sig Dispense Refill   atorvastatin (LIPITOR) 20 MG tablet Take 1 tablet (20 mg total) by mouth daily. 90 tablet 3   Calcium Citrate-Vitamin D (CALCIUM CITRATE + D3 MAXIMUM) 315-250 MG-UNIT TABS Take 1 tablet by mouth 2 (two) times daily. (Patient taking differently: Take 1 tablet by mouth 2 (two) times daily. With magnesium) 120 tablet    Cholecalciferol (VITAMIN D PO) Take 5,000 Units by mouth daily.     clobetasol (TEMOVATE) 0.05 % external solution Apply 1 application topically daily as needed.      Cyanocobalamin (B-12)  [place] under the tongue.     desonide (DESOWEN) 0.05 % cream Apply topically 2 (two) times daily. X1 week 30 g 0   diclofenac Sodium (VOLTAREN) 1 % GEL Apply 4 g topically 4 (four) times daily.     ezetimibe (ZETIA) 10 MG tablet Take 1 tablet (10 mg total) by mouth daily. 90 tablet 3   gabapentin (NEURONTIN) 100 MG capsule TAKE 1 CAPSULE IN THE MORNING, AND 2 CAPSULES AT BEDTIME 270 capsule 3   Glucosamine-Chondroit-Vit C-Mn (GLUCOSAMINE CHONDR 1500 COMPLX PO) Take by mouth.     icosapent Ethyl (VASCEPA) 1 g capsule TAKE (2) CAPSULES TWICE DAILY. 360 capsule 3   levothyroxine (SYNTHROID) 50 MCG tablet Take 1/2 tablet on Mon, Wed, Fri and 1 tablet daily Tues and Thursday 90 tablet 3   losartan (COZAAR) 100 MG tablet Take 1 tablet (100 mg total) by mouth daily. 90 tablet 1   meloxicam (MOBIC) 15 MG tablet TAKE ONE TABLET DAILY AS NEEDED FOR PAIN 90 tablet 0   Multiple Vitamin (MULTI-VITAMIN PO)  Take by mouth daily.     omeprazole (PRILOSEC) 20 MG capsule TAKE 1 CAPSULE 2 TIMES A DAY BEFORE A MEAL 180 capsule 3   Probiotic Product (PROBIOTIC DAILY PO) Take by mouth.     zinc gluconate 50 MG tablet Take 50 mg by mouth daily.     famotidine (PEPCID) 20 MG tablet Take 1 tablet (20 mg total) by mouth daily. X2-4 weeks 30 tablet 0   lubiprostone (AMITIZA) 24 MCG capsule Take 1 capsule (24 mcg total) by mouth 2 (two) times daily with a meal. (Patient not taking: Reported on 10/01/2022) 180 capsule 1   Current Facility-Administered Medications  Medication Dose Route Frequency Provider Last Rate Last Admin   methylPREDNISolone acetate (DEPO-MEDROL) injection 40 mg  40 mg Intramuscular Once Gottschalk, Ashly M, DO        Physical Exam:     BP (!) 130/92   Pulse (!) 48   Ht '5\' 2"'$  (1.575 m)   Wt 137 lb 8 oz (62.4 kg)   BMI 25.15 kg/m   GENERAL:  Pleasant female in NAD PSYCH: : Cooperative, normal affect  NEURO: Alert and oriented x 3, no focal neurologic deficits   IMPRESSION and PLAN:    1) Elevated ALT Isolated very mild elevation in ALT (37) with otherwise normal liver enzymes, albumin, bilirubin, PLT, renal function.  This has had similar transient mild elevations since at least 2014, and is not a concerning finding. - Reassurance provided  2) Constipation - Continue hydration with at least 64 ounces of water daily - Continue high-fiber diet - Trial low FODMAP diet - Ok to use MiraLAX on demand -Does not want to retrial medication such as Linzess, Amitiza, or trial new medication such as Trulance, etc.   RTC prn           Lavena Bullion ,DO, FACG 10/01/2022, 11:18 AM

## 2022-10-04 ENCOUNTER — Encounter: Payer: Self-pay | Admitting: *Deleted

## 2022-10-04 ENCOUNTER — Ambulatory Visit: Payer: Medicare PPO | Attending: Family Medicine | Admitting: *Deleted

## 2022-10-04 DIAGNOSIS — M5459 Other low back pain: Secondary | ICD-10-CM | POA: Diagnosis not present

## 2022-10-04 DIAGNOSIS — M62830 Muscle spasm of back: Secondary | ICD-10-CM | POA: Insufficient documentation

## 2022-10-04 DIAGNOSIS — R293 Abnormal posture: Secondary | ICD-10-CM | POA: Diagnosis not present

## 2022-10-04 NOTE — Therapy (Signed)
OUTPATIENT PHYSICAL THERAPY THORACOLUMBAR TREATMENT   Patient Name: Marissa Perez MRN: [id number] DOB:1945-11-19, 77 y.o., female Today's Date: 09/30/2022  END OF SESSION:  PT End of Session - 09/30/22 1033     Visit Number 14    Number of Visits 18    Date for PT Re-Evaluation 09/15/22    Authorization Type FOTO AT LEAST EVERY 5TH VISIT.  PROGRESS NOTE AT 10TH VISIT.  KX MODIFIER AFTER 15 VISITS.    PT Start Time 1030    PT Stop Time 1120    PT Time Calculation (min) 50 min             Past Medical History:  Diagnosis Date   Acid reflux    Allergy    Arthritis    Diverticulosis    GERD (gastroesophageal reflux disease)    History of ETT 7/10   abnormal    History of stomach ulcers 2008   positive h. pylori   Hyperlipidemia    Hypertension    Hypothyroidism    IBS (irritable bowel syndrome)    Interstitial cystitis    Skin cancer    basal and squamous   Past Surgical History:  Procedure Laterality Date   CESAREAN SECTION  1972   MOHS SURGERY     TONSILLECTOMY  1951   TOTAL ABDOMINAL HYSTERECTOMY W/ BILATERAL SALPINGOOPHORECTOMY  1990   Dr. Tamala Julian / fibroids    Patient Active Problem List   Diagnosis Date Noted   Osteoarthritis of first carpometacarpal joint of left hand 01/15/2022   Osteoarthritis of first carpometacarpal joint of right hand 01/15/2022   Pain in right knee 08/22/2019   Osteoarthritis of multiple joints 01/25/2019   Lumbar spondylosis 12/28/2017   Degeneration of lumbar intervertebral disc 12/13/2017   Aortic atherosclerosis ([place]) 09/22/2017   Family history of breast cancer 07/05/2016   Skin cancer 07/05/2016   Vitamin D deficiency 07/05/2016   Hypothyroidism 12/11/2012   Hyperlipidemia 12/11/2012   Hypertension 12/11/2012   GERD (gastroesophageal reflux disease) 12/11/2012   REFERRING PROVIDER: Benedetto Goad NP  REFERRING DIAG: Lumbar spondylosis with myelopathy  Rationale for Evaluation and Treatment:  Rehabilitation  THERAPY DIAG:  Other low back pain  Abnormal posture  Muscle spasm of back  ONSET DATE: Ongoing.  SUBJECTIVE:                                                                                                                                                                                           SUBJECTIVE STATEMENT: Patient's pain at at 4/10 today. Doing okay today  PERTINENT HISTORY:  H/o LBP, HTN hypothyroidism, OP.  PAIN:  Are  you having pain? Yes: NPRS scale: 4/10 Pain location: Right low back. Pain description: As above. Aggravating factors: As above. Relieving factors: Ice sometimes.  PRECAUTIONS: OP  PATIENT GOALS: Not have pain like she is having and be able to do more.  NEXT MD VISIT:   OBJECTIVE: TODAY'S TREATMENT:                                                                                                                              DATE:                                                                    10/04/22.                                      EXERCISE LOG  Exercise Repetitions and Resistance Comments  NuStep  Lvl 3 x 15 mins arms 7 seat 6   Hooklying  Dying bug X6 each side hold 5 secs Seated at home  Draw-in TA activation x 10 hold 5 secs in sitting as well as hooklying   Bridges x 10    Piriformis stretching X 5 hold 10 secs   AB bracing  x 5 hold 5   Standing bird-dog        BODY MECHANICS:    HMP and IFC at 80-150 Hz on 40% scan x 15 minutes with patient in supine with wedge under knees.   ASSESSMENT:  CLINICAL IMPRESSION: Pt arrived today doing fairly well and reports doing okay after last RX and has been doing HEP intermittently throughout the day as tolerated. Rx focused on TA activation and some lying down core exs today. Pt performs some seated core exs as well as floor depending on how she is feeling if she can get in the floor or not.    OBJECTIVE IMPAIRMENTS: decreased activity tolerance, decreased ROM,  increased muscle spasms, postural dysfunction, and pain.   ACTIVITY LIMITATIONS: carrying, lifting, bending, standing, locomotion level, and caring for others  PARTICIPATION LIMITATIONS: laundry  PERSONAL FACTORS: Time since onset of injury/illness/exacerbation are also affecting patient's functional outcome.   REHAB POTENTIAL: Good  CLINICAL DECISION MAKING: Stable/uncomplicated  EVALUATION COMPLEXITY: Low  GOALS:  LONG TERM GOALS: Target date: 08/18/22  Ind with a HEP. Goal status: MET  2.  Perform ADL's with pain not > 3/10.  Progress: 1/15: most days Goal status: IN PROGRESS  3.  Stand 20 minutes with pain not > 3/10. Progress: 1/15: 8-10 mins prior to 3/10; 2/5: 15 mins Goal status: IN PROGRESS  4.  Walk a community distance with pain not > 3/10. Progress: 1/15: depends  on the day or activity level Goal status: IN PROGRESS  PLAN:  PT FREQUENCY: 3x/week  PT DURATION: 4 weeks  PLANNED INTERVENTIONS: Therapeutic exercises, Therapeutic activity, Patient/Family education, Self Care, Dry Needling, Electrical stimulation, Cryotherapy, Moist heat, Ultrasound, and Manual therapy.  PLAN FOR NEXT SESSION: Combo e'stim, [place], dry needling, core exercise progression, body mechanics training, spinal protection techniques.  Makinzi Prieur,CHRIS, PTA 09/30/2022, 5:49 PM

## 2022-10-08 ENCOUNTER — Ambulatory Visit: Payer: Medicare PPO | Admitting: Physical Therapy

## 2022-10-08 DIAGNOSIS — M62830 Muscle spasm of back: Secondary | ICD-10-CM

## 2022-10-08 DIAGNOSIS — M5459 Other low back pain: Secondary | ICD-10-CM

## 2022-10-08 DIAGNOSIS — R293 Abnormal posture: Secondary | ICD-10-CM

## 2022-10-08 NOTE — Therapy (Signed)
OUTPATIENT PHYSICAL THERAPY THORACOLUMBAR TREATMENT   Patient Name: Marissa Perez MRN: [id number] DOB:[birth year], 77 y.o., female Today's Date: 10/08/2022  END OF SESSION:  PT End of Session - 10/08/22 1232     Visit Number 16    Number of Visits 18    Date for PT Re-Evaluation 10/15/22    Authorization Type FOTO AT LEAST EVERY 5TH VISIT.  PROGRESS NOTE AT 10TH VISIT.  KX MODIFIER AFTER 15 VISITS.    PT Start Time 1030    PT Stop Time 1123    PT Time Calculation (min) 53 min    Activity Tolerance Patient tolerated treatment well    Behavior During Therapy WFL for tasks assessed/performed             Past Medical History:  Diagnosis Date   Acid reflux    Allergy    Arthritis    Diverticulosis    GERD (gastroesophageal reflux disease)    History of ETT 7/10   abnormal    History of stomach ulcers 2008   positive h. pylori   Hyperlipidemia    Hypertension    Hypothyroidism    IBS (irritable bowel syndrome)    Interstitial cystitis    Skin cancer    basal and squamous   Past Surgical History:  Procedure Laterality Date   CESAREAN SECTION  1972   MOHS SURGERY     TONSILLECTOMY  1951   TOTAL ABDOMINAL HYSTERECTOMY W/ BILATERAL SALPINGOOPHORECTOMY  1990   Dr. Tamala Julian / fibroids    Patient Active Problem List   Diagnosis Date Noted   Osteoarthritis of first carpometacarpal joint of left hand 01/15/2022   Osteoarthritis of first carpometacarpal joint of right hand 01/15/2022   Pain in right knee 08/22/2019   Osteoarthritis of multiple joints 01/25/2019   Lumbar spondylosis 12/28/2017   Degeneration of lumbar intervertebral disc 12/13/2017   Aortic atherosclerosis ([place]) 09/22/2017   Family history of breast cancer 07/05/2016   Skin cancer 07/05/2016   Vitamin D deficiency 07/05/2016   Hypothyroidism 12/11/2012   Hyperlipidemia 12/11/2012   Hypertension 12/11/2012   GERD (gastroesophageal reflux disease) 12/11/2012   REFERRING PROVIDER: Benedetto Goad  NP  REFERRING DIAG: Lumbar spondylosis with myelopathy  Rationale for Evaluation and Treatment: Rehabilitation  THERAPY DIAG:  Other low back pain  Abnormal posture  Muscle spasm of back  ONSET DATE: Ongoing.  SUBJECTIVE:                                                                                                                                                                                           SUBJECTIVE STATEMENT: Patient's pain at  at 1/10 today.  PERTINENT HISTORY:  H/o LBP, HTN hypothyroidism, OP.  PAIN:  Are you having pain? Yes: NPRS scale: 1/10 Pain location: Right low back. Pain description: As above. Aggravating factors: As above. Relieving factors: Ice sometimes.  PRECAUTIONS: OP  PATIENT GOALS: Not have pain like she is having and be able to do more.  NEXT MD VISIT:   OBJECTIVE: TODAY'S TREATMENT:                                                                                                                              DATE:                                                                    10/08/22.                                      EXERCISE LOG  Exercise Repetitions and Resistance Comments  NuStep  Lvl 3 x 15 mins arms 7 seat 6   STW/M x 8 minutes to right SIJ ligaments f/b   HMP and Pre-mod at 80-150 Hz x 20 minutes with patient in supine with wedge under knees.   ASSESSMENT:  CLINICAL IMPRESSION: Patient doing very well with a low pain-level upon presentation to the clinic.  Mild tenderness over right SIJ ligaments with good response to STW/M.   OBJECTIVE IMPAIRMENTS: decreased activity tolerance, decreased ROM, increased muscle spasms, postural dysfunction, and pain.   ACTIVITY LIMITATIONS: carrying, lifting, bending, standing, locomotion level, and caring for others  PARTICIPATION LIMITATIONS: laundry  PERSONAL FACTORS: Time since onset of injury/illness/exacerbation are also affecting patient's functional outcome.   REHAB  POTENTIAL: Good  CLINICAL DECISION MAKING: Stable/uncomplicated  EVALUATION COMPLEXITY: Low  GOALS:  LONG TERM GOALS: Target date: 08/18/22  Ind with a HEP. Goal status: MET  2.  Perform ADL's with pain not > 3/10.  Progress: 1/15: most days Goal status: IN PROGRESS  3.  Stand 20 minutes with pain not > 3/10. Progress: 1/15: 8-10 mins prior to 3/10; 2/5: 15 mins Goal status: IN PROGRESS  4.  Walk a community distance with pain not > 3/10. Progress: 1/15: depends on the day or activity level Goal status: IN PROGRESS  PLAN:  PT FREQUENCY: 3x/week  PT DURATION: 4 weeks  PLANNED INTERVENTIONS: Therapeutic exercises, Therapeutic activity, Patient/Family education, Self Care, Dry Needling, Electrical stimulation, Cryotherapy, Moist heat, Ultrasound, and Manual therapy.  PLAN FOR NEXT SESSION: Combo e'stim, [place], dry needling, core exercise progression, body mechanics training, spinal protection techniques.  Elpidia Karn, [place], PT 10/08/2022, 12:37 PM

## 2022-10-12 ENCOUNTER — Encounter: Payer: Self-pay | Admitting: Family Medicine

## 2022-10-13 ENCOUNTER — Ambulatory Visit: Payer: Medicare PPO | Admitting: Physical Therapy

## 2022-10-13 DIAGNOSIS — M5459 Other low back pain: Secondary | ICD-10-CM

## 2022-10-13 DIAGNOSIS — R293 Abnormal posture: Secondary | ICD-10-CM | POA: Diagnosis not present

## 2022-10-13 DIAGNOSIS — M62830 Muscle spasm of back: Secondary | ICD-10-CM

## 2022-10-13 NOTE — Therapy (Addendum)
OUTPATIENT PHYSICAL THERAPY THORACOLUMBAR TREATMENT   Patient Name: Marissa Perez MRN: [id number] DOB:03-05-1946, 77 y.o., female Today's Date: 10/13/2022  END OF SESSION:  PT End of Session - 10/13/22 1234     Visit Number 17    Number of Visits 18    Date for PT Re-Evaluation 10/15/22    Authorization Type FOTO AT LEAST EVERY 5TH VISIT.  PROGRESS NOTE AT 10TH VISIT.  KX MODIFIER AFTER 15 VISITS.    PT Start Time 0945    PT Stop Time 1031    PT Time Calculation (min) 46 min    Activity Tolerance Patient tolerated treatment well    Behavior During Therapy WFL for tasks assessed/performed             Past Medical History:  Diagnosis Date   Acid reflux    Allergy    Arthritis    Diverticulosis    GERD (gastroesophageal reflux disease)    History of ETT 7/10   abnormal    History of stomach ulcers 2008   positive h. pylori   Hyperlipidemia    Hypertension    Hypothyroidism    IBS (irritable bowel syndrome)    Interstitial cystitis    Skin cancer    basal and squamous   Past Surgical History:  Procedure Laterality Date   CESAREAN SECTION  1972   MOHS SURGERY     TONSILLECTOMY  1951   TOTAL ABDOMINAL HYSTERECTOMY W/ BILATERAL SALPINGOOPHORECTOMY  1990   Dr. Tamala Julian / fibroids    Patient Active Problem List   Diagnosis Date Noted   Osteoarthritis of first carpometacarpal joint of left hand 01/15/2022   Osteoarthritis of first carpometacarpal joint of right hand 01/15/2022   Pain in right knee 08/22/2019   Osteoarthritis of multiple joints 01/25/2019   Lumbar spondylosis 12/28/2017   Degeneration of lumbar intervertebral disc 12/13/2017   Aortic atherosclerosis ([place]) 09/22/2017   Family history of breast cancer 07/05/2016   Skin cancer 07/05/2016   Vitamin D deficiency 07/05/2016   Hypothyroidism 12/11/2012   Hyperlipidemia 12/11/2012   Hypertension 12/11/2012   GERD (gastroesophageal reflux disease) 12/11/2012   REFERRING PROVIDER: Benedetto Goad  NP  REFERRING DIAG: Lumbar spondylosis with myelopathy  Rationale for Evaluation and Treatment: Rehabilitation  THERAPY DIAG:  Other low back pain  Abnormal posture  Muscle spasm of back  ONSET DATE: Ongoing.  SUBJECTIVE:                                                                                                                                                                                           SUBJECTIVE STATEMENT: Patient's pain at  at 3/10 today.  PERTINENT HISTORY:  H/o LBP, HTN hypothyroidism, OP.  PAIN:  Are you having pain? Yes: NPRS scale: 3/10 Pain location: Right low back. Pain description: As above. Aggravating factors: As above. Relieving factors: Ice sometimes.  PRECAUTIONS: OP  PATIENT GOALS: Not have pain like she is having and be able to do more.  NEXT MD VISIT:   OBJECTIVE: TODAY'S TREATMENT:                                                                                                                              DATE:                                                                    10/13/22.                                      EXERCISE LOG  Exercise Repetitions and Resistance Comments  NuStep  Lvl 3 x 15 mins    STW/M x 8 minutes to right SIJ ligaments f/b   HMP and Pre-mod at 80-150 Hz x 12 minutes with patient in supine with wedge under knees.   ASSESSMENT:  CLINICAL IMPRESSION: Doing very well overall since starting PT.  She will be out of town and upon returning complete her last visit.  OBJECTIVE IMPAIRMENTS: decreased activity tolerance, decreased ROM, increased muscle spasms, postural dysfunction, and pain.   ACTIVITY LIMITATIONS: carrying, lifting, bending, standing, locomotion level, and caring for others  PARTICIPATION LIMITATIONS: laundry  PERSONAL FACTORS: Time since onset of injury/illness/exacerbation are also affecting patient's functional outcome.   REHAB POTENTIAL: Good  CLINICAL DECISION MAKING:  Stable/uncomplicated  EVALUATION COMPLEXITY: Low  GOALS:  LONG TERM GOALS: Target date: 08/18/22  Ind with a HEP. Goal status: MET  2.  Perform ADL's with pain not > 3/10.  Progress: 1/15: most days Goal status: IN PROGRESS  3.  Stand 20 minutes with pain not > 3/10. Progress: 1/15: 8-10 mins prior to 3/10; 2/5: 15 mins Goal status: IN PROGRESS  4.  Walk a community distance with pain not > 3/10. Progress: 1/15: depends on the day or activity level Goal status: IN PROGRESS  PLAN:  PT FREQUENCY: 3x/week  PT DURATION: 4 weeks  PLANNED INTERVENTIONS: Therapeutic exercises, Therapeutic activity, Patient/Family education, Self Care, Dry Needling, Electrical stimulation, Cryotherapy, Moist heat, Ultrasound, and Manual therapy.  PLAN FOR NEXT SESSION: Combo e'stim, [place], dry needling, core exercise progression, body mechanics training, spinal protection techniques.  Tatijana Bierly, [place], PT 10/13/2022, 12:35 PM

## 2022-10-14 DIAGNOSIS — M9903 Segmental and somatic dysfunction of lumbar region: Secondary | ICD-10-CM | POA: Diagnosis not present

## 2022-10-14 DIAGNOSIS — M62830 Muscle spasm of back: Secondary | ICD-10-CM | POA: Diagnosis not present

## 2022-10-14 DIAGNOSIS — M9901 Segmental and somatic dysfunction of cervical region: Secondary | ICD-10-CM | POA: Diagnosis not present

## 2022-10-14 DIAGNOSIS — M9902 Segmental and somatic dysfunction of thoracic region: Secondary | ICD-10-CM | POA: Diagnosis not present

## 2022-10-27 ENCOUNTER — Encounter: Payer: Self-pay | Admitting: Physical Therapy

## 2022-10-27 ENCOUNTER — Ambulatory Visit: Payer: Medicare PPO | Admitting: Physical Therapy

## 2022-10-27 DIAGNOSIS — M62830 Muscle spasm of back: Secondary | ICD-10-CM

## 2022-10-27 DIAGNOSIS — M5459 Other low back pain: Secondary | ICD-10-CM | POA: Diagnosis not present

## 2022-10-27 DIAGNOSIS — R293 Abnormal posture: Secondary | ICD-10-CM

## 2022-10-27 NOTE — Therapy (Addendum)
OUTPATIENT PHYSICAL THERAPY THORACOLUMBAR TREATMENT   Patient Name: Marissa Perez MRN: [id number] DOB:[birth year], 77 y.o., female Today's Date: 10/27/2022  END OF SESSION:  PT End of Session - 10/27/22 1132     Visit Number 18    Number of Visits 18    Date for PT Re-Evaluation 10/15/22    Authorization Type FOTO AT LEAST EVERY 5TH VISIT.  PROGRESS NOTE AT 10TH VISIT.  KX MODIFIER AFTER 15 VISITS.    PT Start Time 1119    PT Stop Time 1204    PT Time Calculation (min) 45 min    Activity Tolerance Patient tolerated treatment well    Behavior During Therapy WFL for tasks assessed/performed             Past Medical History:  Diagnosis Date   Acid reflux    Allergy    Arthritis    Diverticulosis    GERD (gastroesophageal reflux disease)    History of ETT 7/10   abnormal    History of stomach ulcers 2008   positive h. pylori   Hyperlipidemia    Hypertension    Hypothyroidism    IBS (irritable bowel syndrome)    Interstitial cystitis    Skin cancer    basal and squamous   Past Surgical History:  Procedure Laterality Date   CESAREAN SECTION  1972   MOHS SURGERY     TONSILLECTOMY  1951   TOTAL ABDOMINAL HYSTERECTOMY W/ BILATERAL SALPINGOOPHORECTOMY  1990   Dr. Tamala Julian / fibroids    Patient Active Problem List   Diagnosis Date Noted   Osteoarthritis of first carpometacarpal joint of left hand 01/15/2022   Osteoarthritis of first carpometacarpal joint of right hand 01/15/2022   Pain in right knee 08/22/2019   Osteoarthritis of multiple joints 01/25/2019   Lumbar spondylosis 12/28/2017   Degeneration of lumbar intervertebral disc 12/13/2017   Aortic atherosclerosis ([place]) 09/22/2017   Family history of breast cancer 07/05/2016   Skin cancer 07/05/2016   Vitamin D deficiency 07/05/2016   Hypothyroidism 12/11/2012   Hyperlipidemia 12/11/2012   Hypertension 12/11/2012   GERD (gastroesophageal reflux disease) 12/11/2012   REFERRING PROVIDER: Benedetto Goad  NP  REFERRING DIAG: Lumbar spondylosis with myelopathy  Rationale for Evaluation and Treatment: Rehabilitation  THERAPY DIAG:  Other low back pain  Abnormal posture  Muscle spasm of back  ONSET DATE: Ongoing.  SUBJECTIVE:                                                                                                                                                                                           SUBJECTIVE STATEMENT: Had more pain  when she got up this morning from bed but has gotten better since she moved around.  PERTINENT HISTORY:  H/o LBP, HTN hypothyroidism, OP.  PAIN: Are you having pain? No.  PRECAUTIONS: OP  PATIENT GOALS: Not have pain like she is having and be able to do more.  NEXT MD VISIT:   OBJECTIVE: TODAY'S TREATMENT:                                                                                                                              DATE:    10/27/22.                                      EXERCISE LOG  Exercise Repetitions and Resistance Comments  NuStep  Lvl 3 x 15 mins     Manual Therapy Soft Tissue Mobilization: B low back/SI, reduce pain    Modalities  Date: 10/27/2022 Unattended Estim: Lumbar, Pre-Mod, 10 mins, Pain Combo: Lumbar, 1.5 w/cm2, 100%, 1 mhz, 10 mins, Pain Hot Pack: Lumbar, 10 mins, Pain  ASSESSMENT:  CLINICAL IMPRESSION:   Patient presented in clinic with no current pain. Patient states that she still has pain some days and may not have a full day without pain but can state that she has more good days than bad days with pain. Patient able to tolerate mild activity but if pain spikes, she has to be more cautious. FOTO score 58% at DC. Normal modalities response noted following removal of the modalities. Patient has met all goals.  OBJECTIVE IMPAIRMENTS: decreased activity tolerance, decreased ROM, increased muscle spasms, postural dysfunction, and pain.   ACTIVITY LIMITATIONS: carrying, lifting, bending, standing,  locomotion level, and caring for others  PARTICIPATION LIMITATIONS: laundry  PERSONAL FACTORS: Time since onset of injury/illness/exacerbation are also affecting patient's functional outcome.   REHAB POTENTIAL: Good  CLINICAL DECISION MAKING: Stable/uncomplicated  EVALUATION COMPLEXITY: Low  GOALS:  LONG TERM GOALS: Target date: 08/18/22  Ind with a HEP. Goal status: MET  2.  Perform ADL's with pain not > 3/10.  Progress: 1/15: most days Goal status: MET  3.  Stand 20 minutes with pain not > 3/10. Progress: 1/15: 8-10 mins prior to 3/10; 2/5: 15 mins Goal status: PARTIALLY MET ("SOMETIMES")  4.  Walk a community distance with pain not > 3/10. Progress: 1/15: depends on the day or activity level Goal status: MET  PLAN:  PT FREQUENCY: 3x/week  PT DURATION: 4 weeks  PLANNED INTERVENTIONS: Therapeutic exercises, Therapeutic activity, Patient/Family education, Self Care, Dry Needling, Electrical stimulation, Cryotherapy, Moist heat, Ultrasound, and Manual therapy.  PLAN FOR NEXT SESSION: DC  Standley Brooking, PTA 10/27/2022, 12:09 PM  PHYSICAL THERAPY DISCHARGE SUMMARY  Visits from Start of Care: 18.  Current functional level related to goals / functional outcomes: See above.   Remaining deficits: All goals met.   Education / Equipment: HEP.  Patient agrees to discharge. Patient goals were met. Patient is being discharged due to meeting the stated rehab goals.    Mali Applegate MPT

## 2022-10-28 DIAGNOSIS — M9901 Segmental and somatic dysfunction of cervical region: Secondary | ICD-10-CM | POA: Diagnosis not present

## 2022-10-28 DIAGNOSIS — M62830 Muscle spasm of back: Secondary | ICD-10-CM | POA: Diagnosis not present

## 2022-10-28 DIAGNOSIS — M9902 Segmental and somatic dysfunction of thoracic region: Secondary | ICD-10-CM | POA: Diagnosis not present

## 2022-10-28 DIAGNOSIS — M9903 Segmental and somatic dysfunction of lumbar region: Secondary | ICD-10-CM | POA: Diagnosis not present

## 2022-11-10 DIAGNOSIS — M9901 Segmental and somatic dysfunction of cervical region: Secondary | ICD-10-CM | POA: Diagnosis not present

## 2022-11-10 DIAGNOSIS — M9903 Segmental and somatic dysfunction of lumbar region: Secondary | ICD-10-CM | POA: Diagnosis not present

## 2022-11-10 DIAGNOSIS — M9902 Segmental and somatic dysfunction of thoracic region: Secondary | ICD-10-CM | POA: Diagnosis not present

## 2022-11-10 DIAGNOSIS — M62830 Muscle spasm of back: Secondary | ICD-10-CM | POA: Diagnosis not present

## 2022-11-24 DIAGNOSIS — M9901 Segmental and somatic dysfunction of cervical region: Secondary | ICD-10-CM | POA: Diagnosis not present

## 2022-11-24 DIAGNOSIS — M62830 Muscle spasm of back: Secondary | ICD-10-CM | POA: Diagnosis not present

## 2022-11-24 DIAGNOSIS — M9902 Segmental and somatic dysfunction of thoracic region: Secondary | ICD-10-CM | POA: Diagnosis not present

## 2022-11-24 DIAGNOSIS — M9903 Segmental and somatic dysfunction of lumbar region: Secondary | ICD-10-CM | POA: Diagnosis not present

## 2022-12-09 DIAGNOSIS — M62830 Muscle spasm of back: Secondary | ICD-10-CM | POA: Diagnosis not present

## 2022-12-09 DIAGNOSIS — M9902 Segmental and somatic dysfunction of thoracic region: Secondary | ICD-10-CM | POA: Diagnosis not present

## 2022-12-09 DIAGNOSIS — M9903 Segmental and somatic dysfunction of lumbar region: Secondary | ICD-10-CM | POA: Diagnosis not present

## 2022-12-09 DIAGNOSIS — M9901 Segmental and somatic dysfunction of cervical region: Secondary | ICD-10-CM | POA: Diagnosis not present

## 2022-12-23 DIAGNOSIS — M62830 Muscle spasm of back: Secondary | ICD-10-CM | POA: Diagnosis not present

## 2022-12-23 DIAGNOSIS — M9901 Segmental and somatic dysfunction of cervical region: Secondary | ICD-10-CM | POA: Diagnosis not present

## 2022-12-23 DIAGNOSIS — M9903 Segmental and somatic dysfunction of lumbar region: Secondary | ICD-10-CM | POA: Diagnosis not present

## 2022-12-23 DIAGNOSIS — M9902 Segmental and somatic dysfunction of thoracic region: Secondary | ICD-10-CM | POA: Diagnosis not present

## 2023-01-06 DIAGNOSIS — M9902 Segmental and somatic dysfunction of thoracic region: Secondary | ICD-10-CM | POA: Diagnosis not present

## 2023-01-06 DIAGNOSIS — M62830 Muscle spasm of back: Secondary | ICD-10-CM | POA: Diagnosis not present

## 2023-01-06 DIAGNOSIS — M9903 Segmental and somatic dysfunction of lumbar region: Secondary | ICD-10-CM | POA: Diagnosis not present

## 2023-01-06 DIAGNOSIS — M9901 Segmental and somatic dysfunction of cervical region: Secondary | ICD-10-CM | POA: Diagnosis not present

## 2023-01-13 DIAGNOSIS — M62830 Muscle spasm of back: Secondary | ICD-10-CM | POA: Diagnosis not present

## 2023-01-13 DIAGNOSIS — M9901 Segmental and somatic dysfunction of cervical region: Secondary | ICD-10-CM | POA: Diagnosis not present

## 2023-01-13 DIAGNOSIS — M9903 Segmental and somatic dysfunction of lumbar region: Secondary | ICD-10-CM | POA: Diagnosis not present

## 2023-01-13 DIAGNOSIS — M9902 Segmental and somatic dysfunction of thoracic region: Secondary | ICD-10-CM | POA: Diagnosis not present

## 2023-01-27 DIAGNOSIS — M9902 Segmental and somatic dysfunction of thoracic region: Secondary | ICD-10-CM | POA: Diagnosis not present

## 2023-01-27 DIAGNOSIS — M62830 Muscle spasm of back: Secondary | ICD-10-CM | POA: Diagnosis not present

## 2023-01-27 DIAGNOSIS — M9901 Segmental and somatic dysfunction of cervical region: Secondary | ICD-10-CM | POA: Diagnosis not present

## 2023-01-27 DIAGNOSIS — M9903 Segmental and somatic dysfunction of lumbar region: Secondary | ICD-10-CM | POA: Diagnosis not present

## 2023-02-04 ENCOUNTER — Ambulatory Visit (INDEPENDENT_AMBULATORY_CARE_PROVIDER_SITE_OTHER): Payer: Medicare PPO

## 2023-02-04 VITALS — Ht 61.0 in | Wt 134.0 lb

## 2023-02-04 DIAGNOSIS — Z0000 Encounter for general adult medical examination without abnormal findings: Secondary | ICD-10-CM

## 2023-02-04 NOTE — Progress Notes (Signed)
Subjective:   Marissa Perez is a 76 y.o. female who presents for Medicare Annual (Subsequent) preventive examination.  Visit Complete: Virtual  I connected with  Marissa Perez on 02/04/23 by a audio enabled telemedicine application and verified that I am speaking with the correct person using two identifiers.  Patient Location: Home  Provider Location: Home Office  I discussed the limitations of evaluation and management by telemedicine. The patient expressed understanding and agreed to proceed.  Patient Medicare AWV questionnaire was completed by the patient on 02/04/2023; I have confirmed that all information answered by patient is correct and no changes since this date.  Review of Systems           Objective:    Today's Vitals   02/04/23 1332  Weight: 134 lb (60.8 kg)  Height: 5\' 1"  (1.549 m)   Body mass index is 25.32 kg/m.     02/04/2023    1:35 PM 07/21/2022   12:42 PM 01/29/2022    1:21 PM 01/28/2020    1:18 PM 01/12/2019   10:02 AM 01/10/2018    8:22 AM 01/03/2017    9:06 AM  Advanced Directives  Does Patient Have a Medical Advance Directive? Yes Yes Yes Yes Yes Yes Yes  Type of Estate agent of [place];Living will  Healthcare Power of [place];Living will Healthcare Power of [place];Living will Healthcare Power of [place];Living will Healthcare Power of [place];Living will Healthcare Power of [place];Living will  Does patient want to make changes to medical advance directive? No - Patient declined   No - Patient declined No - Patient declined    Copy of Healthcare Power of Attorney in Chart? Yes - validated most recent copy scanned in chart (See row information)  Yes - validated most recent copy scanned in chart (See row information) No - copy requested Yes - validated most recent copy scanned in chart (See row information) No - copy requested No - copy requested    Current Medications (verified) Outpatient Encounter Medications as of  02/04/2023  Medication Sig   atorvastatin (LIPITOR) 20 MG tablet Take 1 tablet (20 mg total) by mouth daily.   Calcium Citrate-Vitamin D (CALCIUM CITRATE + D3 MAXIMUM) 315-250 MG-UNIT TABS Take 1 tablet by mouth 2 (two) times daily. (Patient taking differently: Take 1 tablet by mouth 2 (two) times daily. With magnesium)   Cholecalciferol (VITAMIN D PO) Take 5,000 Units by mouth daily.   clobetasol (TEMOVATE) 0.05 % external solution Apply 1 application topically daily as needed.    Cyanocobalamin (B-12) 1000 MCG SUBL Place under the tongue.   desonide (DESOWEN) 0.05 % cream Apply topically 2 (two) times daily. X1 week   diclofenac Sodium (VOLTAREN) 1 % GEL Apply 4 g topically 4 (four) times daily.   ezetimibe (ZETIA) 10 MG tablet Take 1 tablet (10 mg total) by mouth daily.   gabapentin (NEURONTIN) 100 MG capsule TAKE 1 CAPSULE EVERY MORNING AND 2 CAPSULES AT BEDTIME   Glucosamine-Chondroit-Vit C-Mn (GLUCOSAMINE CHONDR 1500 COMPLX PO) Take by mouth.   icosapent Ethyl (VASCEPA) 1 g capsule TAKE (2) CAPSULES TWICE DAILY.   levothyroxine (SYNTHROID) 50 MCG tablet Take 1/2 tablet on Mon, Wed, Fri and 1 tablet daily Tues and Thursday   losartan (COZAAR) 100 MG tablet Take 1 tablet (100 mg total) by mouth daily.   meloxicam (MOBIC) 15 MG tablet TAKE ONE TABLET DAILY AS NEEDED FOR PAIN   Multiple Vitamin (MULTI-VITAMIN PO) Take by mouth daily.   omeprazole (PRILOSEC) 20  MG capsule TAKE 1 CAPSULE 2 TIMES A DAY BEFORE A MEAL   Probiotic Product (PROBIOTIC DAILY PO) Take by mouth.   zinc gluconate 50 MG tablet Take 50 mg by mouth daily.   famotidine (PEPCID) 20 MG tablet Take 1 tablet (20 mg total) by mouth daily. X2-4 weeks   lubiprostone (AMITIZA) 24 MCG capsule Take 1 capsule (24 mcg total) by mouth 2 (two) times daily with a meal.   Facility-Administered Encounter Medications as of 02/04/2023  Medication   methylPREDNISolone acetate (DEPO-MEDROL) injection 40 mg    Allergies (verified) Codeine,  Femring [estradiol], Cephalexin, Erythromycin, and Penicillins   History: Past Medical History:  Diagnosis Date   Acid reflux    Allergy    Arthritis    Diverticulosis    GERD (gastroesophageal reflux disease)    History of ETT 7/10   abnormal    History of stomach ulcers 2008   positive h. pylori   Hyperlipidemia    Hypertension    Hypothyroidism    IBS (irritable bowel syndrome)    Interstitial cystitis    Skin cancer    basal and squamous   Past Surgical History:  Procedure Laterality Date   CESAREAN SECTION  1972   MOHS SURGERY     TONSILLECTOMY  1951   TOTAL ABDOMINAL HYSTERECTOMY W/ BILATERAL SALPINGOOPHORECTOMY  1990   Dr. Katrinka Blazing / fibroids    Family History  Problem Relation Age of Onset   Diabetes Father    Heart disease Father    Heart attack Father 36   Breast cancer Mother    Breast cancer Paternal Grandmother        Aunt also    Breast cancer Other        Family History   Breast cancer Maternal Aunt    Breast cancer Paternal Aunt    Colon cancer Neg Hx    Esophageal cancer Neg Hx    Rectal cancer Neg Hx    Stomach cancer Neg Hx    Social History   Socioeconomic History   Marital status: Married    Spouse name: Marissa Perez"   Number of children: 1   Years of education: 16   Highest education level: Bachelor's degree (e.g., BA, AB, BS)  Occupational History   Occupation: Retired    Associate Professor: Tour manager    Comment: Teacher  Tobacco Use   Smoking status: Never   Smokeless tobacco: Never  Vaping Use   Vaping Use: Never used  Substance and Sexual Activity   Alcohol use: Yes    Alcohol/week: 7.0 standard drinks of alcohol    Types: 7 Glasses of wine per week    Comment: 1 daily   Drug use: No   Sexual activity: Never  Other Topics Concern   Not on file  Social History Narrative   Lives home with her husband "Marissa Perez" Their son lives in [place]. One grandchild   Social Determinants of Health   Financial Resource Strain: Low Risk   (02/04/2023)   Overall Financial Resource Strain (CARDIA)    Difficulty of Paying Living Expenses: Not hard at all  Food Insecurity: No Food Insecurity (02/04/2023)   Hunger Vital Sign    Worried About Running Out of Food in the Last Year: Never true    Ran Out of Food in the Last Year: Never true  Transportation Needs: No Transportation Needs (02/04/2023)   PRAPARE - Administrator, Civil Service (Medical): No    Lack of Transportation (  Non-Medical): No  Physical Activity: Sufficiently Active (02/04/2023)   Exercise Vital Sign    Days of Exercise per Week: 7 days    Minutes of Exercise per Session: 30 min  Stress: No Stress Concern Present (02/04/2023)   [employer] of Occupational Health - Occupational Stress Questionnaire    Feeling of Stress : Not at all  Social Connections: Moderately Integrated (02/04/2023)   Social Connection and Isolation Panel [NHANES]    Frequency of Communication with Friends and Family: More than three times a week    Frequency of Social Gatherings with Friends and Family: More than three times a week    Attends Religious Services: More than 4 times per year    Active Member of [employer] or Organizations: No    Attends Engineer, structural: Never    Marital Status: Married    Tobacco Counseling Counseling given: Not Answered   Clinical Intake:  Pre-visit preparation completed: Yes  Pain : No/denies pain     Nutritional Risks: None Diabetes: No  How often do you need to have someone help you when you read instructions, pamphlets, or other written materials from your doctor or pharmacy?: 1 - Never  Interpreter Needed?: No  Information entered by :: Marissa Ora, LPN   Activities of Daily Living    02/02/2023   11:18 AM  In your present state of health, do you have any difficulty performing the following activities:  Hearing? 0  Vision? 0  Difficulty concentrating or making decisions? 0  Walking or climbing stairs? 0   Dressing or bathing? 0  Doing errands, shopping? 0  Preparing Food and eating ? N  Using the Toilet? N  In the past six months, have you accidently leaked urine? N  Do you have problems with loss of bowel control? N  Managing your Medications? N  Managing your Finances? N  Housekeeping or managing your Housekeeping? N    Patient Care Team: Raliegh Ip, DO as PCP - General (Family Medicine) Cherlyn Roberts, MD as Consulting Physician (Dermatology) Louis Meckel, MD (Inactive) as Consulting Physician (Gastroenterology) Olivia Canter, MD as Consulting Physician (Ophthalmology) Shellia Cleverly, DO as Consulting Physician (Gastroenterology) Moody Bruins as Physician Assistant (Chiropractic Medicine)  Indicate any recent Medical Services you may have received from other than [facility] providers in the past year (date may be approximate).     Assessment:   This is a routine wellness examination for [place].  Hearing/Vision screen Vision Screening - Comments:: Wears rx glasses - up to date with routine eye exams with  Dr.Scott Groat   Dietary issues and exercise activities discussed:     Goals Addressed             This Visit's Progress    Exercise 150 min/wk Moderate Activity   On track    Walking daily       Depression Screen    02/04/2023    1:35 PM 08/18/2022   11:19 AM 07/05/2022   11:35 AM 06/10/2022   12:24 PM 02/17/2022    9:44 AM 01/29/2022    1:20 PM 08/14/2021    9:09 AM  PHQ 2/9 Scores  PHQ - 2 Score 0 0 0 0 0 0 0  PHQ- 9 Score    0       Fall Risk    02/04/2023    1:33 PM 02/02/2023   11:18 AM 08/18/2022   11:19 AM 07/05/2022   11:35 AM 06/10/2022  12:24 PM  Fall Risk   Falls in the past year? 0 0 0 0 0  Number falls in past yr: 0      Injury with Fall? 0      Risk for fall due to : No Fall Risks      Follow up Falls prevention discussed        MEDICARE RISK AT HOME:  Medicare Risk at Home - 02/04/23 1333     Any stairs in or  around the home? Yes    If so, are there any without handrails? No    Home free of loose throw rugs in walkways, pet beds, electrical cords, etc? Yes    Adequate lighting in your home to reduce risk of falls? Yes    Life alert? No    Use of a cane, walker or w/c? No    Grab bars in the bathroom? Yes    Shower chair or bench in shower? Yes    Elevated toilet seat or a handicapped toilet? Yes             TIMED UP AND GO:  Was the test performed?  No    Cognitive Function:    01/10/2018   10:56 AM 01/03/2017   11:32 AM 12/12/2015   12:41 PM  MMSE - Mini Mental State Exam  Orientation to time 5 5 5   Orientation to Place 5 5 5   Registration 3 3 3   Attention/ Calculation 5 5 5   Recall 3 3 3   Language- name 2 objects 2 2 2   Language- repeat 1 1 1   Language- follow 3 step command 3 3 3   Language- read & follow direction 1 1 1   Write a sentence 1 1 1   Copy design 1 1 1   Total score 30 30 30         02/04/2023    1:36 PM 01/29/2022    1:21 PM 01/28/2020    1:22 PM 01/12/2019    9:45 AM  6CIT Screen  What Year? 0 points 0 points 0 points 0 points  What month? 0 points 0 points 0 points 0 points  What time? 0 points 0 points 0 points 0 points  Count back from 20 0 points 0 points 0 points 0 points  Months in reverse 0 points 0 points 0 points 0 points  Repeat phrase 0 points 0 points 0 points 0 points  Total Score 0 points 0 points 0 points 0 points    Immunizations Immunization History  Administered Date(s) Administered   Fluad Quad(high Dose 65+) 05/04/2019, 05/28/2020, 05/06/2021, 05/22/2022   Influenza Split 05/21/2013, 05/22/2022   Influenza, High Dose Seasonal PF 05/10/2017, 05/08/2018   Influenza, Quadrivalent, Recombinant, Inj, Pf 05/06/2016   Influenza,inj,Quad PF,6+ Mos 05/02/2015   Influenza,inj,quad, With Preservative 05/10/2014, 05/02/2017   Moderna Covid-19 Vaccine Bivalent Booster 56yrs & up 06/30/2021   Moderna Sars-Covid-2 Vaccination 03/03/2021    PFIZER(Purple Top)SARS-COV-2 Vaccination 08/22/2019, 09/10/2019, 05/28/2020, 06/22/2022   Pneumococcal Conjugate-13 08/29/2013   Pneumococcal Polysaccharide-23 01/31/2011   Rsv, Bivalent, Protein Subunit Rsvpref,pf Verdis Frederickson) 05/22/2022   Td 02/10/2021   Tdap 02/03/2011   Zoster Recombinant(Shingrix) 01/03/2017, 04/11/2017    TDAP status: Up to date  Flu Vaccine status: Up to date  Pneumococcal vaccine status: Up to date  Covid-19 vaccine status: Completed vaccines  Qualifies for Shingles Vaccine? Yes   Zostavax completed Yes   Shingrix Completed?: Yes  Screening Tests Health Maintenance  Topic Date Due   COVID-19 Vaccine (7 -  2023-24 season) 08/17/2022   INFLUENZA VACCINE  03/03/2023   MAMMOGRAM  05/29/2023   Medicare Annual Wellness (AWV)  02/04/2024   DEXA SCAN  09/06/2024   DTaP/Tdap/Td (3 - Td or Tdap) 02/11/2031   Pneumonia Vaccine 30+ Years old  Completed   Hepatitis C Screening  Completed   Zoster Vaccines- Shingrix  Completed   HPV VACCINES  Aged Out   Colonoscopy  Discontinued    Health Maintenance  Health Maintenance Due  Topic Date Due   COVID-19 Vaccine (7 - 2023-24 season) 08/17/2022    Colorectal cancer screening: No longer required.   Mammogram status: No longer required due to age .  Bone Density status: Completed 09/06/2022. Results reflect: Bone density results: OSTEOPOROSIS. Repeat every 2 years.  Lung Cancer Screening: (Low Dose CT Chest recommended if Age 24-80 years, 20 pack-year currently smoking OR have quit w/in 15years.) does not qualify.   Lung Cancer Screening Referral: n/a  Additional Screening:  Hepatitis C Screening: does not qualify; Completed 11/25/2016  Vision Screening: Recommended annual ophthalmology exams for early detection of glaucoma and other disorders of the eye. Is the patient up to date with their annual eye exam?  Yes  Who is the provider or what is the name of the office in which the patient attends annual eye  exams? Dr.groat  If pt is not established with a provider, would they like to be referred to a provider to establish care? No .   Dental Screening: Recommended annual dental exams for proper oral hygiene    Community Resource Referral / Chronic Care Management: CRR required this visit?  No   CCM required this visit?  No     Plan:     I have personally reviewed and noted the following in the patient's chart:   Medical and social history Use of alcohol, tobacco or illicit drugs  Current medications and supplements including opioid prescriptions. Patient is currently taking opioid prescriptions. Information provided to patient regarding non-opioid alternatives. Patient advised to discuss non-opioid treatment plan with their provider. Functional ability and status Nutritional status Physical activity Advanced directives List of other physicians Hospitalizations, surgeries, and ER visits in previous 12 months Vitals Screenings to include cognitive, depression, and falls Referrals and appointments  In addition, I have reviewed and discussed with patient certain preventive protocols, quality metrics, and best practice recommendations. A written personalized care plan for preventive services as well as general preventive health recommendations were provided to patient.     Lorrene Reid, LPN   08/07/1094   After Visit Summary: (MyChart) Due to this being a telephonic visit, the after visit summary with patients personalized plan was offered to patient via MyChart   Nurse Notes: none

## 2023-02-04 NOTE — Patient Instructions (Signed)
Marissa Perez , Thank you for taking time to come for your Medicare Wellness Visit. I appreciate your ongoing commitment to your health goals. Please review the following plan we discussed and let me know if I can assist you in the future.   These are the goals we discussed:  Goals      Exercise 150 min/wk Moderate Activity     Walking daily     Patient Stated     01/29/2022 AWV Goal: Fall Prevention  Over the next year, patient will decrease their risk for falls by: Using assistive devices, such as a cane or walker, as needed Identifying fall risks within their home and correcting them by: Removing throw rugs Adding handrails to stairs or ramps Removing clutter and keeping a clear pathway throughout the home Increasing light, especially at night Adding shower handles/bars Raising toilet seat Identifying potential personal risk factors for falls: Medication side effects Incontinence/urgency Vestibular dysfunction Hearing loss Musculoskeletal disorders Neurological disorders Orthostatic hypotension          This is a list of the screening recommended for you and due dates:  Health Maintenance  Topic Date Due   COVID-19 Vaccine (7 - 2023-24 season) 08/17/2022   Flu Shot  03/03/2023   Mammogram  05/29/2023   Medicare Annual Wellness Visit  02/04/2024   DEXA scan (bone density measurement)  09/06/2024   DTaP/Tdap/Td vaccine (3 - Td or Tdap) 02/11/2031   Pneumonia Vaccine  Completed   Hepatitis C Screening  Completed   Zoster (Shingles) Vaccine  Completed   HPV Vaccine  Aged Out   Colon Cancer Screening  Discontinued    Advanced directives: In Chart   Conditions/risks identified: Aim for 30 minutes of exercise or brisk walking, 6-8 glasses of water, and 5 servings of fruits and vegetables each day.   Next appointment: Follow up in one year for your annual wellness visit    Preventive Care 65 Years and Older, Female Preventive care refers to lifestyle choices and  visits with your health care provider that can promote health and wellness. What does preventive care include? A yearly physical exam. This is also called an annual well check. Dental exams once or twice a year. Routine eye exams. Ask your health care provider how often you should have your eyes checked. Personal lifestyle choices, including: Daily care of your teeth and gums. Regular physical activity. Eating a healthy diet. Avoiding tobacco and drug use. Limiting alcohol use. Practicing safe sex. Taking low-dose aspirin every day. Taking vitamin and mineral supplements as recommended by your health care provider. What happens during an annual well check? The services and screenings done by your health care provider during your annual well check will depend on your age, overall health, lifestyle risk factors, and family history of disease. Counseling  Your health care provider may ask you questions about your: Alcohol use. Tobacco use. Drug use. Emotional well-being. Home and relationship well-being. Sexual activity. Eating habits. History of falls. Memory and ability to understand (cognition). Work and work Astronomer. Reproductive health. Screening  You may have the following tests or measurements: Height, weight, and BMI. Blood pressure. Lipid and cholesterol levels. These may be checked every 5 years, or more frequently if you are over 91 years old. Skin check. Lung cancer screening. You may have this screening every year starting at age 92 if you have a 30-pack-year history of smoking and currently smoke or have quit within the past 15 years. Fecal occult blood test (FOBT) of  the stool. You may have this test every year starting at age 4. Flexible sigmoidoscopy or colonoscopy. You may have a sigmoidoscopy every 5 years or a colonoscopy every 10 years starting at age 100. Hepatitis C blood test. Hepatitis B blood test. Sexually transmitted disease (STD)  testing. Diabetes screening. This is done by checking your blood sugar (glucose) after you have not eaten for a while (fasting). You may have this done every 1-3 years. Bone density scan. This is done to screen for osteoporosis. You may have this done starting at age 4. Mammogram. This may be done every 1-2 years. Talk to your health care provider about how often you should have regular mammograms. Talk with your health care provider about your test results, treatment options, and if necessary, the need for more tests. Vaccines  Your health care provider may recommend certain vaccines, such as: Influenza vaccine. This is recommended every year. Tetanus, diphtheria, and acellular pertussis (Tdap, Td) vaccine. You may need a Td booster every 10 years. Zoster vaccine. You may need this after age 55. Pneumococcal 13-valent conjugate (PCV13) vaccine. One dose is recommended after age 55. Pneumococcal polysaccharide (PPSV23) vaccine. One dose is recommended after age 42. Talk to your health care provider about which screenings and vaccines you need and how often you need them. This information is not intended to replace advice given to you by your health care provider. Make sure you discuss any questions you have with your health care provider. Document Released: 08/15/2015 Document Revised: 04/07/2016 Document Reviewed: 05/20/2015 Elsevier Interactive Patient Education  2017 [employer].  Fall Prevention in the Home Falls can cause injuries. They can happen to people of all ages. There are many things you can do to make your home safe and to help prevent falls. What can I do on the outside of my home? Regularly fix the edges of walkways and driveways and fix any cracks. Remove anything that might make you trip as you walk through a door, such as a raised step or threshold. Trim any bushes or trees on the path to your home. Use bright outdoor lighting. Clear any walking paths of anything that  might make someone trip, such as rocks or tools. Regularly check to see if handrails are loose or broken. Make sure that both sides of any steps have handrails. Any raised decks and porches should have guardrails on the edges. Have any leaves, snow, or ice cleared regularly. Use sand or salt on walking paths during winter. Clean up any spills in your garage right away. This includes oil or grease spills. What can I do in the bathroom? Use night lights. Install grab bars by the toilet and in the tub and shower. Do not use towel bars as grab bars. Use non-skid mats or decals in the tub or shower. If you need to sit down in the shower, use a plastic, non-slip stool. Keep the floor dry. Clean up any water that spills on the floor as soon as it happens. Remove soap buildup in the tub or shower regularly. Attach bath mats securely with double-sided non-slip rug tape. Do not have throw rugs and other things on the floor that can make you trip. What can I do in the bedroom? Use night lights. Make sure that you have a light by your bed that is easy to reach. Do not use any sheets or blankets that are too big for your bed. They should not hang down onto the floor. Have a firm chair  that has side arms. You can use this for support while you get dressed. Do not have throw rugs and other things on the floor that can make you trip. What can I do in the kitchen? Clean up any spills right away. Avoid walking on wet floors. Keep items that you use a lot in easy-to-reach places. If you need to reach something above you, use a strong step stool that has a grab bar. Keep electrical cords out of the way. Do not use floor polish or wax that makes floors slippery. If you must use wax, use non-skid floor wax. Do not have throw rugs and other things on the floor that can make you trip. What can I do with my stairs? Do not leave any items on the stairs. Make sure that there are handrails on both sides of the  stairs and use them. Fix handrails that are broken or loose. Make sure that handrails are as long as the stairways. Check any carpeting to make sure that it is firmly attached to the stairs. Fix any carpet that is loose or worn. Avoid having throw rugs at the top or bottom of the stairs. If you do have throw rugs, attach them to the floor with carpet tape. Make sure that you have a light switch at the top of the stairs and the bottom of the stairs. If you do not have them, ask someone to add them for you. What else can I do to help prevent falls? Wear shoes that: Do not have high heels. Have rubber bottoms. Are comfortable and fit you well. Are closed at the toe. Do not wear sandals. If you use a stepladder: Make sure that it is fully opened. Do not climb a closed stepladder. Make sure that both sides of the stepladder are locked into place. Ask someone to hold it for you, if possible. Clearly mark and make sure that you can see: Any grab bars or handrails. First and last steps. Where the edge of each step is. Use tools that help you move around (mobility aids) if they are needed. These include: Canes. Walkers. Scooters. Crutches. Turn on the lights when you go into a dark area. Replace any light bulbs as soon as they burn out. Set up your furniture so you have a clear path. Avoid moving your furniture around. If any of your floors are uneven, fix them. If there are any pets around you, be aware of where they are. Review your medicines with your doctor. Some medicines can make you feel dizzy. This can increase your chance of falling. Ask your doctor what other things that you can do to help prevent falls. This information is not intended to replace advice given to you by your health care provider. Make sure you discuss any questions you have with your health care provider. Document Released: 05/15/2009 Document Revised: 12/25/2015 Document Reviewed: 08/23/2014 Elsevier Interactive  Patient Education  2017 [employer].

## 2023-02-09 DIAGNOSIS — M9902 Segmental and somatic dysfunction of thoracic region: Secondary | ICD-10-CM | POA: Diagnosis not present

## 2023-02-09 DIAGNOSIS — M9903 Segmental and somatic dysfunction of lumbar region: Secondary | ICD-10-CM | POA: Diagnosis not present

## 2023-02-09 DIAGNOSIS — M62830 Muscle spasm of back: Secondary | ICD-10-CM | POA: Diagnosis not present

## 2023-02-09 DIAGNOSIS — M9901 Segmental and somatic dysfunction of cervical region: Secondary | ICD-10-CM | POA: Diagnosis not present

## 2023-02-15 ENCOUNTER — Other Ambulatory Visit: Payer: Medicare PPO

## 2023-02-15 ENCOUNTER — Other Ambulatory Visit: Payer: Self-pay

## 2023-02-15 DIAGNOSIS — E034 Atrophy of thyroid (acquired): Secondary | ICD-10-CM

## 2023-02-16 ENCOUNTER — Ambulatory Visit (INDEPENDENT_AMBULATORY_CARE_PROVIDER_SITE_OTHER): Payer: Medicare PPO | Admitting: Family Medicine

## 2023-02-16 ENCOUNTER — Encounter: Payer: Self-pay | Admitting: Family Medicine

## 2023-02-16 VITALS — BP 98/58 | HR 70 | Temp 98.3°F | Ht 61.0 in | Wt 136.0 lb

## 2023-02-16 DIAGNOSIS — Z0001 Encounter for general adult medical examination with abnormal findings: Secondary | ICD-10-CM

## 2023-02-16 DIAGNOSIS — E034 Atrophy of thyroid (acquired): Secondary | ICD-10-CM

## 2023-02-16 DIAGNOSIS — K219 Gastro-esophageal reflux disease without esophagitis: Secondary | ICD-10-CM | POA: Diagnosis not present

## 2023-02-16 DIAGNOSIS — E559 Vitamin D deficiency, unspecified: Secondary | ICD-10-CM | POA: Diagnosis not present

## 2023-02-16 DIAGNOSIS — I10 Essential (primary) hypertension: Secondary | ICD-10-CM

## 2023-02-16 DIAGNOSIS — M85832 Other specified disorders of bone density and structure, left forearm: Secondary | ICD-10-CM

## 2023-02-16 DIAGNOSIS — E782 Mixed hyperlipidemia: Secondary | ICD-10-CM | POA: Diagnosis not present

## 2023-02-16 DIAGNOSIS — I700 Atherosclerosis of aorta: Secondary | ICD-10-CM

## 2023-02-16 DIAGNOSIS — Z0000 Encounter for general adult medical examination without abnormal findings: Secondary | ICD-10-CM

## 2023-02-16 LAB — THYROID PANEL WITH TSH
Free Thyroxine Index: 1.8 (ref 1.2–4.9)
T3 Uptake Ratio: 28 % (ref 24–39)
T4, Total: 6.3 ug/dL (ref 4.5–12.0)
TSH: 3.27 u[IU]/mL (ref 0.450–4.500)

## 2023-02-16 LAB — T4, FREE: Free T4: 1.28 ng/dL (ref 0.82–1.77)

## 2023-02-16 NOTE — Patient Instructions (Signed)
Preventive Care 65 Years and Older, Female Preventive care refers to lifestyle choices and visits with your health care provider that can promote health and wellness. Preventive care visits are also called wellness exams. What can I expect for my preventive care visit? Counseling Your health care provider may ask you questions about your: Medical history, including: Past medical problems. Family medical history. Pregnancy and menstrual history. History of falls. Current health, including: Memory and ability to understand (cognition). Emotional well-being. Home life and relationship well-being. Sexual activity and sexual health. Lifestyle, including: Alcohol, nicotine or tobacco, and drug use. Access to firearms. Diet, exercise, and sleep habits. Work and work environment. Sunscreen use. Safety issues such as seatbelt and bike helmet use. Physical exam Your health care provider will check your: Height and weight. These may be used to calculate your BMI (body mass index). BMI is a measurement that tells if you are at a healthy weight. Waist circumference. This measures the distance around your waistline. This measurement also tells if you are at a healthy weight and may help predict your risk of certain diseases, such as type 2 diabetes and high blood pressure. Heart rate and blood pressure. Body temperature. Skin for abnormal spots. What immunizations do I need?  Vaccines are usually given at various ages, according to a schedule. Your health care provider will recommend vaccines for you based on your age, medical history, and lifestyle or other factors, such as travel or where you work. What tests do I need? Screening Your health care provider may recommend screening tests for certain conditions. This may include: Lipid and cholesterol levels. Hepatitis C test. Hepatitis B test. HIV (human immunodeficiency virus) test. STI (sexually transmitted infection) testing, if you are at  risk. Lung cancer screening. Colorectal cancer screening. Diabetes screening. This is done by checking your blood sugar (glucose) after you have not eaten for a while (fasting). Mammogram. Talk with your health care provider about how often you should have regular mammograms. BRCA-related cancer screening. This may be done if you have a family history of breast, ovarian, tubal, or peritoneal cancers. Bone density scan. This is done to screen for osteoporosis. Talk with your health care provider about your test results, treatment options, and if necessary, the need for more tests. Follow these instructions at home: Eating and drinking  Eat a diet that includes fresh fruits and vegetables, whole grains, lean protein, and low-fat dairy products. Limit your intake of foods with high amounts of sugar, saturated fats, and salt. Take vitamin and mineral supplements as recommended by your health care provider. Do not drink alcohol if your health care provider tells you not to drink. If you drink alcohol: Limit how much you have to 0-1 drink a day. Know how much alcohol is in your drink. In the [place] one drink equals one 12 oz bottle of beer (355 mL), one 5 oz glass of wine (148 mL), or one 1 oz glass of hard liquor (44 mL). Lifestyle Brush your teeth every morning and night with fluoride toothpaste. Floss one time each day. Exercise for at least 30 minutes 5 or more days each week. Do not use any products that contain nicotine or tobacco. These products include cigarettes, chewing tobacco, and vaping devices, such as e-cigarettes. If you need help quitting, ask your health care provider. Do not use drugs. If you are sexually active, practice safe sex. Use a condom or other form of protection in order to prevent STIs. Take aspirin only as told by   your health care provider. Make sure that you understand how much to take and what form to take. Work with your health care provider to find out whether it  is safe and beneficial for you to take aspirin daily. Ask your health care provider if you need to take a cholesterol-lowering medicine (statin). Find healthy ways to manage stress, such as: Meditation, yoga, or listening to music. Journaling. Talking to a trusted person. Spending time with friends and family. Minimize exposure to UV radiation to reduce your risk of skin cancer. Safety Always wear your seat belt while driving or riding in a vehicle. Do not drive: If you have been drinking alcohol. Do not ride with someone who has been drinking. When you are tired or distracted. While texting. If you have been using any mind-altering substances or drugs. Wear a helmet and other protective equipment during sports activities. If you have firearms in your house, make sure you follow all gun safety procedures. What's next? Visit your health care provider once a year for an annual wellness visit. Ask your health care provider how often you should have your eyes and teeth checked. Stay up to date on all vaccines. This information is not intended to replace advice given to you by your health care provider. Make sure you discuss any questions you have with your health care provider. Document Revised: 01/14/2021 Document Reviewed: 01/14/2021 Elsevier Patient Education  2024 [employer]  

## 2023-02-16 NOTE — Progress Notes (Signed)
Marissa Perez is a 78 y.o. female presents to office today for annual physical exam examination.    Concerns today include: 1.  She voices no concerns today.  She will be following up with Dr. Johna Roles soon with regards to a spot on the right side of her scalp.  She is currently compliant with all medications as prescribed by him and myself.  Would like refills on all.  Occupation: retired, Marital status: married, Substance use: none Health Maintenance Due  Topic Date Due   COVID-19 Vaccine (7 - 2023-24 season) 08/17/2022   Refills needed today: all Immunizations needed: Immunization History  Administered Date(s) Administered   Fluad Quad(high Dose 65+) 05/04/2019, 05/28/2020, 05/06/2021, 05/22/2022   Influenza Split 05/21/2013, 05/22/2022   Influenza, High Dose Seasonal PF 05/10/2017, 05/08/2018   Influenza, Quadrivalent, Recombinant, Inj, Pf 05/06/2016   Influenza,inj,Quad PF,6+ Mos 05/02/2015   Influenza,inj,quad, With Preservative 05/10/2014, 05/02/2017   Moderna Covid-19 Vaccine Bivalent Booster 17yrs & up 06/30/2021   Moderna Sars-Covid-2 Vaccination 03/03/2021   PFIZER(Purple Top)SARS-COV-2 Vaccination 08/22/2019, 09/10/2019, 05/28/2020, 06/22/2022   Pneumococcal Conjugate-13 08/29/2013   Pneumococcal Polysaccharide-23 01/31/2011   Rsv, Bivalent, Protein Subunit Rsvpref,pf Verdis Frederickson) 05/22/2022   Td 02/10/2021   Tdap 02/03/2011   Zoster Recombinant(Shingrix) 01/03/2017, 04/11/2017     Past Medical History:  Diagnosis Date   Acid reflux    Allergy    Arthritis    Diverticulosis    GERD (gastroesophageal reflux disease)    History of ETT 7/10   abnormal    History of stomach ulcers 2008   positive h. pylori   Hyperlipidemia    Hypertension    Hypothyroidism    IBS (irritable bowel syndrome)    Interstitial cystitis    Skin cancer    basal and squamous   Social History   Socioeconomic History   Marital status: Married    Spouse name: Earnestine Leys"    Number of children: 1   Years of education: 16   Highest education level: Bachelor's degree (e.g., BA, AB, BS)  Occupational History   Occupation: Retired    Associate Professor: Tour manager    Comment: Teacher  Tobacco Use   Smoking status: Never   Smokeless tobacco: Never  Vaping Use   Vaping status: Never Used  Substance and Sexual Activity   Alcohol use: Yes    Alcohol/week: 7.0 standard drinks of alcohol    Types: 7 Glasses of wine per week    Comment: 1 daily   Drug use: No   Sexual activity: Never  Other Topics Concern   Not on file  Social History Narrative   Lives home with her husband "Tammy Sours" Their son lives in [place]. One grandchild   Social Determinants of Health   Financial Resource Strain: Low Risk  (02/04/2023)   Overall Financial Resource Strain (CARDIA)    Difficulty of Paying Living Expenses: Not hard at all  Food Insecurity: No Food Insecurity (02/04/2023)   Hunger Vital Sign    Worried About Running Out of Food in the Last Year: Never true    Ran Out of Food in the Last Year: Never true  Transportation Needs: No Transportation Needs (02/04/2023)   PRAPARE - Administrator, Civil Service (Medical): No    Lack of Transportation (Non-Medical): No  Physical Activity: Sufficiently Active (02/04/2023)   Exercise Vital Sign    Days of Exercise per Week: 7 days    Minutes of Exercise per Session: 30 min  Stress: No  Stress Concern Present (02/04/2023)   [employer] of Occupational Health - Occupational Stress Questionnaire    Feeling of Stress : Not at all  Social Connections: Moderately Integrated (02/04/2023)   Social Connection and Isolation Panel [NHANES]    Frequency of Communication with Friends and Family: More than three times a week    Frequency of Social Gatherings with Friends and Family: More than three times a week    Attends Religious Services: More than 4 times per year    Active Member of [employer] or Organizations: No    Attends Tax inspector Meetings: Never    Marital Status: Married  Catering manager Violence: Not At Risk (01/29/2022)   Humiliation, Afraid, Rape, and Kick questionnaire    Fear of Current or Ex-Partner: No    Emotionally Abused: No    Physically Abused: No    Sexually Abused: No   Past Surgical History:  Procedure Laterality Date   CESAREAN SECTION  1972   MOHS SURGERY     TONSILLECTOMY  1951   TOTAL ABDOMINAL HYSTERECTOMY W/ BILATERAL SALPINGOOPHORECTOMY  1990   Dr. Katrinka Blazing / fibroids    Family History  Problem Relation Age of Onset   Diabetes Father    Heart disease Father    Heart attack Father 13   Breast cancer Mother    Breast cancer Paternal Grandmother        Aunt also    Breast cancer Other        Family History   Breast cancer Maternal Aunt    Breast cancer Paternal Aunt    Colon cancer Neg Hx    Esophageal cancer Neg Hx    Rectal cancer Neg Hx    Stomach cancer Neg Hx     Current Outpatient Medications:    atorvastatin (LIPITOR) 20 MG tablet, Take 1 tablet (20 mg total) by mouth daily., Disp: 90 tablet, Rfl: 3   Calcium Citrate-Vitamin D (CALCIUM CITRATE + D3 MAXIMUM) 315-250 MG-UNIT TABS, Take 1 tablet by mouth 2 (two) times daily. (Patient taking differently: Take 1 tablet by mouth 2 (two) times daily. With magnesium), Disp: 120 tablet, Rfl:    Cholecalciferol (VITAMIN D PO), Take 5,000 Units by mouth daily., Disp: , Rfl:    clobetasol (TEMOVATE) 0.05 % external solution, Apply 1 application topically daily as needed. , Disp: , Rfl:    Cyanocobalamin (B-12) 1000 MCG SUBL, Place under the tongue., Disp: , Rfl:    desonide (DESOWEN) 0.05 % cream, Apply topically 2 (two) times daily. X1 week, Disp: 30 g, Rfl: 0   diclofenac Sodium (VOLTAREN) 1 % GEL, Apply 4 g topically 4 (four) times daily., Disp: , Rfl:    ezetimibe (ZETIA) 10 MG tablet, Take 1 tablet (10 mg total) by mouth daily., Disp: 90 tablet, Rfl: 3   famotidine (PEPCID) 20 MG tablet, Take 1 tablet (20 mg total) by  mouth daily. X2-4 weeks, Disp: 30 tablet, Rfl: 0   gabapentin (NEURONTIN) 100 MG capsule, TAKE 1 CAPSULE EVERY MORNING AND 2 CAPSULES AT BEDTIME, Disp: 270 capsule, Rfl: 1   Glucosamine-Chondroit-Vit C-Mn (GLUCOSAMINE CHONDR 1500 COMPLX PO), Take by mouth., Disp: , Rfl:    icosapent Ethyl (VASCEPA) 1 g capsule, TAKE (2) CAPSULES TWICE DAILY., Disp: 360 capsule, Rfl: 3   levothyroxine (SYNTHROID) 50 MCG tablet, Take 1/2 tablet on Mon, Wed, Fri and 1 tablet daily Tues and Thursday, Disp: 90 tablet, Rfl: 3   losartan (COZAAR) 100 MG tablet, Take 1 tablet (100 mg  total) by mouth daily., Disp: 90 tablet, Rfl: 1   lubiprostone (AMITIZA) 24 MCG capsule, Take 1 capsule (24 mcg total) by mouth 2 (two) times daily with a meal., Disp: 180 capsule, Rfl: 1   meloxicam (MOBIC) 15 MG tablet, TAKE ONE TABLET DAILY AS NEEDED FOR PAIN, Disp: 90 tablet, Rfl: 0   Multiple Vitamin (MULTI-VITAMIN PO), Take by mouth daily., Disp: , Rfl:    omeprazole (PRILOSEC) 20 MG capsule, TAKE 1 CAPSULE 2 TIMES A DAY BEFORE A MEAL, Disp: 180 capsule, Rfl: 3   Probiotic Product (PROBIOTIC DAILY PO), Take by mouth., Disp: , Rfl:    zinc gluconate 50 MG tablet, Take 50 mg by mouth daily., Disp: , Rfl:   Current Facility-Administered Medications:    methylPREDNISolone acetate (DEPO-MEDROL) injection 40 mg, 40 mg, Intramuscular, Once, Kaylum Shrum M, DO  Allergies  Allergen Reactions   Codeine     Headache     Femring [Estradiol] Rash   Cephalexin Rash   Erythromycin Other (See Comments)    Gi  Upset     Penicillins Rash     ROS: Review of Systems Pertinent items noted in HPI and remainder of comprehensive ROS otherwise negative.    Physical exam BP (!) 98/58   Pulse 70   Temp 98.3 F (36.8 C)   Ht 5\' 1"  (1.549 m)   Wt 136 lb (61.7 kg)   SpO2 98%   BMI 25.70 kg/m  General appearance: alert, cooperative, appears stated age, and no distress Head: atraumatic, alopecia present.  She has postinflammatory  hyperpigmentation along the parietal side of the head on the right as well as a small patch of raised, hyperemic skin along the right temporal parietal region of the head Eyes: negative findings: lids and lashes normal, conjunctivae and sclerae normal, corneas clear, and pupils equal, round, reactive to light and accomodation Ears: R normal TM and external ear canal.  Left TM was obscured by cerumen.  After irrigation she has about 30% occlusion of the TM due to dried skin in the external auditory canal affecting the upper third of the canal.  The visualized portion of the TM appeared normal Nose: Nares normal. Septum midline. Mucosa normal. No drainage or sinus tenderness. Throat: lips, mucosa, and tongue normal; teeth and gums normal Neck: no adenopathy, no carotid bruit, supple, symmetrical, trachea midline, and thyroid not enlarged, symmetric, no tenderness/mass/nodules Back: symmetric, no curvature. ROM normal. No CVA tenderness. Lungs: clear to auscultation bilaterally Heart: regular rate and rhythm, S1, S2 normal, no murmur, click, rub or gallop Abdomen: soft, non-tender; bowel sounds normal; no masses,  no organomegaly Extremities: extremities normal, atraumatic, no cyanosis or edema Pulses: 2+ and symmetric Skin:  Several seborrheic keratoses and pigmented nevi noted along the trunk and back.  She has area of patchy irregularity on the side of her head as above. Lymph nodes: Cervical, supraclavicular, and axillary nodes normal. Neurologic: Grossly normal Psych: Mood stable, speech normal, affect appropriate.  Very pleasant, interactive     02/04/2023    1:35 PM 08/18/2022   11:19 AM 07/05/2022   11:35 AM  Depression screen PHQ 2/9  Decreased Interest 0 0 0  Down, Depressed, Hopeless 0 0 0  PHQ - 2 Score 0 0 0  Difficult doing work/chores   Not difficult at all      08/18/2022   11:19 AM 02/17/2022    9:44 AM 04/08/2021    9:37 AM 02/10/2021   10:12 AM  GAD 7 : Generalized  Anxiety  Score  Nervous, Anxious, on Edge 0 0 0 0  Control/stop worrying 0 0 0 0  Worry too much - different things 0 0 0 0  Trouble relaxing 0 0 0 0  Restless 0 0 0 0  Easily annoyed or irritable 0 0 0 0  Afraid - awful might happen 0 0 0 0  Total GAD 7 Score 0 0 0 0  Anxiety Difficulty Not difficult at all Not difficult at all Not difficult at all      Assessment/ Plan: Ellard Artis here for annual physical exam.   Annual physical exam  Hypothyroidism due to acquired atrophy of thyroid - Plan: TSH, T4, free, levothyroxine (SYNTHROID) 50 MCG tablet  Mixed hyperlipidemia - Plan: Lipid panel, CMP14+EGFR, atorvastatin (LIPITOR) 20 MG tablet, ezetimibe (ZETIA) 10 MG tablet  Essential hypertension - Plan: CMP14+EGFR, losartan (COZAAR) 100 MG tablet  Aortic atherosclerosis (HCC) - Plan: Lipid panel, CMP14+EGFR, atorvastatin (LIPITOR) 20 MG tablet, ezetimibe (ZETIA) 10 MG tablet  Gastroesophageal reflux disease without esophagitis - Plan: CBC with Differential, omeprazole (PRILOSEC) 20 MG capsule  Vitamin D deficiency - Plan: VITAMIN D 25 Hydroxy (Vit-D Deficiency, Fractures)  Osteopenia of left forearm - Plan: VITAMIN D 25 Hydroxy (Vit-D Deficiency, Fractures)  Chronic medical issues are stable.  Fasting labs have been preordered in anticipation of January appointment.  All meds have been renewed for 1 year.  She will continue to follow-up with dermatology as scheduled for skin issues and surveillance.  Blood pressure somewhat on the borderline of low today so I wonder if perhaps we might need to reduce her losartan to 50 mg.  I am going to have her monitor her blood pressures at home and if persistently less than 110 over 60s, we will plan to reduce to 50 mg of losartan  Counseled on healthy lifestyle choices, including diet (rich in fruits, vegetables and lean meats and low in salt and simple carbohydrates) and exercise (at least 30 minutes of moderate physical activity daily).  Patient  to follow up 90m  Annett Boxwell M. Nadine Counts, DO

## 2023-02-17 ENCOUNTER — Encounter: Payer: Self-pay | Admitting: Family Medicine

## 2023-02-17 MED ORDER — LEVOTHYROXINE SODIUM 50 MCG PO TABS
ORAL_TABLET | ORAL | 3 refills | Status: DC
Start: 2023-02-17 — End: 2024-03-23

## 2023-02-17 MED ORDER — LOSARTAN POTASSIUM 100 MG PO TABS
100.0000 mg | ORAL_TABLET | Freq: Every day | ORAL | 3 refills | Status: DC
Start: 2023-02-17 — End: 2024-03-22

## 2023-02-17 MED ORDER — ATORVASTATIN CALCIUM 20 MG PO TABS
20.0000 mg | ORAL_TABLET | Freq: Every day | ORAL | 3 refills | Status: DC
Start: 2023-02-17 — End: 2023-08-19

## 2023-02-17 MED ORDER — OMEPRAZOLE 20 MG PO CPDR
20.0000 mg | DELAYED_RELEASE_CAPSULE | Freq: Two times a day (BID) | ORAL | 3 refills | Status: DC | PRN
Start: 2023-02-17 — End: 2024-03-22

## 2023-02-17 MED ORDER — MELOXICAM 15 MG PO TABS: 15.0000 mg | ORAL_TABLET | Freq: Every day | ORAL | 0 refills | Status: DC | PRN

## 2023-02-17 MED ORDER — EZETIMIBE 10 MG PO TABS
10.0000 mg | ORAL_TABLET | Freq: Every day | ORAL | 3 refills | Status: DC
Start: 2023-02-17 — End: 2023-08-19

## 2023-02-23 DIAGNOSIS — M9901 Segmental and somatic dysfunction of cervical region: Secondary | ICD-10-CM | POA: Diagnosis not present

## 2023-02-23 DIAGNOSIS — M9902 Segmental and somatic dysfunction of thoracic region: Secondary | ICD-10-CM | POA: Diagnosis not present

## 2023-02-23 DIAGNOSIS — M9903 Segmental and somatic dysfunction of lumbar region: Secondary | ICD-10-CM | POA: Diagnosis not present

## 2023-02-23 DIAGNOSIS — M62830 Muscle spasm of back: Secondary | ICD-10-CM | POA: Diagnosis not present

## 2023-03-07 DIAGNOSIS — D225 Melanocytic nevi of trunk: Secondary | ICD-10-CM | POA: Diagnosis not present

## 2023-03-07 DIAGNOSIS — L570 Actinic keratosis: Secondary | ICD-10-CM | POA: Diagnosis not present

## 2023-03-07 DIAGNOSIS — L578 Other skin changes due to chronic exposure to nonionizing radiation: Secondary | ICD-10-CM | POA: Diagnosis not present

## 2023-03-07 DIAGNOSIS — L821 Other seborrheic keratosis: Secondary | ICD-10-CM | POA: Diagnosis not present

## 2023-03-07 DIAGNOSIS — L814 Other melanin hyperpigmentation: Secondary | ICD-10-CM | POA: Diagnosis not present

## 2023-03-10 DIAGNOSIS — M9902 Segmental and somatic dysfunction of thoracic region: Secondary | ICD-10-CM | POA: Diagnosis not present

## 2023-03-10 DIAGNOSIS — M62830 Muscle spasm of back: Secondary | ICD-10-CM | POA: Diagnosis not present

## 2023-03-10 DIAGNOSIS — M9903 Segmental and somatic dysfunction of lumbar region: Secondary | ICD-10-CM | POA: Diagnosis not present

## 2023-03-10 DIAGNOSIS — M9901 Segmental and somatic dysfunction of cervical region: Secondary | ICD-10-CM | POA: Diagnosis not present

## 2023-03-23 DIAGNOSIS — M9903 Segmental and somatic dysfunction of lumbar region: Secondary | ICD-10-CM | POA: Diagnosis not present

## 2023-03-23 DIAGNOSIS — M9902 Segmental and somatic dysfunction of thoracic region: Secondary | ICD-10-CM | POA: Diagnosis not present

## 2023-03-23 DIAGNOSIS — M9901 Segmental and somatic dysfunction of cervical region: Secondary | ICD-10-CM | POA: Diagnosis not present

## 2023-03-23 DIAGNOSIS — M62830 Muscle spasm of back: Secondary | ICD-10-CM | POA: Diagnosis not present

## 2023-03-26 ENCOUNTER — Other Ambulatory Visit: Payer: Self-pay | Admitting: Family Medicine

## 2023-04-06 DIAGNOSIS — M62830 Muscle spasm of back: Secondary | ICD-10-CM | POA: Diagnosis not present

## 2023-04-06 DIAGNOSIS — M9903 Segmental and somatic dysfunction of lumbar region: Secondary | ICD-10-CM | POA: Diagnosis not present

## 2023-04-06 DIAGNOSIS — M9902 Segmental and somatic dysfunction of thoracic region: Secondary | ICD-10-CM | POA: Diagnosis not present

## 2023-04-06 DIAGNOSIS — M9901 Segmental and somatic dysfunction of cervical region: Secondary | ICD-10-CM | POA: Diagnosis not present

## 2023-04-21 DIAGNOSIS — M9901 Segmental and somatic dysfunction of cervical region: Secondary | ICD-10-CM | POA: Diagnosis not present

## 2023-04-21 DIAGNOSIS — M9902 Segmental and somatic dysfunction of thoracic region: Secondary | ICD-10-CM | POA: Diagnosis not present

## 2023-04-21 DIAGNOSIS — M9903 Segmental and somatic dysfunction of lumbar region: Secondary | ICD-10-CM | POA: Diagnosis not present

## 2023-04-21 DIAGNOSIS — M62830 Muscle spasm of back: Secondary | ICD-10-CM | POA: Diagnosis not present

## 2023-05-04 ENCOUNTER — Other Ambulatory Visit: Payer: Self-pay | Admitting: Family Medicine

## 2023-05-04 DIAGNOSIS — Z1231 Encounter for screening mammogram for malignant neoplasm of breast: Secondary | ICD-10-CM

## 2023-05-05 DIAGNOSIS — M62830 Muscle spasm of back: Secondary | ICD-10-CM | POA: Diagnosis not present

## 2023-05-05 DIAGNOSIS — M9903 Segmental and somatic dysfunction of lumbar region: Secondary | ICD-10-CM | POA: Diagnosis not present

## 2023-05-05 DIAGNOSIS — M9902 Segmental and somatic dysfunction of thoracic region: Secondary | ICD-10-CM | POA: Diagnosis not present

## 2023-05-05 DIAGNOSIS — M9901 Segmental and somatic dysfunction of cervical region: Secondary | ICD-10-CM | POA: Diagnosis not present

## 2023-05-10 ENCOUNTER — Encounter: Payer: Self-pay | Admitting: Family Medicine

## 2023-05-16 DIAGNOSIS — M9903 Segmental and somatic dysfunction of lumbar region: Secondary | ICD-10-CM | POA: Diagnosis not present

## 2023-05-16 DIAGNOSIS — M62830 Muscle spasm of back: Secondary | ICD-10-CM | POA: Diagnosis not present

## 2023-05-16 DIAGNOSIS — M9901 Segmental and somatic dysfunction of cervical region: Secondary | ICD-10-CM | POA: Diagnosis not present

## 2023-05-16 DIAGNOSIS — M9902 Segmental and somatic dysfunction of thoracic region: Secondary | ICD-10-CM | POA: Diagnosis not present

## 2023-06-02 DIAGNOSIS — M9902 Segmental and somatic dysfunction of thoracic region: Secondary | ICD-10-CM | POA: Diagnosis not present

## 2023-06-02 DIAGNOSIS — M9903 Segmental and somatic dysfunction of lumbar region: Secondary | ICD-10-CM | POA: Diagnosis not present

## 2023-06-02 DIAGNOSIS — M62830 Muscle spasm of back: Secondary | ICD-10-CM | POA: Diagnosis not present

## 2023-06-02 DIAGNOSIS — M9901 Segmental and somatic dysfunction of cervical region: Secondary | ICD-10-CM | POA: Diagnosis not present

## 2023-06-09 ENCOUNTER — Other Ambulatory Visit: Payer: Self-pay | Admitting: Family Medicine

## 2023-06-09 ENCOUNTER — Ambulatory Visit
Admission: RE | Admit: 2023-06-09 | Discharge: 2023-06-09 | Disposition: A | Discharge status: Home / Self Care | Payer: Medicare PPO | Source: Ambulatory Visit | Attending: Family Medicine | Admitting: Family Medicine

## 2023-06-09 DIAGNOSIS — Z1231 Encounter for screening mammogram for malignant neoplasm of breast: Secondary | ICD-10-CM | POA: Diagnosis not present

## 2023-06-15 ENCOUNTER — Ambulatory Visit: Payer: Medicare PPO | Admitting: Family Medicine

## 2023-06-15 ENCOUNTER — Telehealth: Payer: Self-pay | Admitting: Family Medicine

## 2023-06-15 VITALS — BP 144/79 | HR 70 | Temp 98.3°F | Ht 61.0 in | Wt 140.0 lb

## 2023-06-15 DIAGNOSIS — H6122 Impacted cerumen, left ear: Secondary | ICD-10-CM

## 2023-06-15 DIAGNOSIS — H60332 Swimmer's ear, left ear: Secondary | ICD-10-CM | POA: Diagnosis not present

## 2023-06-15 MED ORDER — NEOMYCIN-POLYMYXIN-HC 1 % OT SOLN
3.0000 [drp] | Freq: Four times a day (QID) | OTIC | 0 refills | Status: AC
Start: 2023-06-15 — End: 2023-06-22

## 2023-06-15 NOTE — Telephone Encounter (Signed)
Appointment was scheduled.

## 2023-06-15 NOTE — Progress Notes (Signed)
BP (!) 144/79   Pulse 70   Temp 98.3 F (36.8 C)   Ht 5\' 1"  (1.549 m)   Wt 140 lb (63.5 kg)   SpO2 97%   BMI 26.45 kg/m    Subjective:   Patient ID: Marissa Perez, female    DOB: Oct 13, 1945, 77 y.o.   MRN: [id number]  HPI: Marissa Perez is a 77 y.o. female presenting on 06/15/2023 for Ear Fullness   HPI Ear congestion Patient is coming in today complaining of ear congestion (both ears collected congested and stuffed up and she thinks is due to allergies and tried some rattling that did not seem to help.  She did take some Tylenol sinus which may be helped a little bit.  She denies any nasal congestions or sinus pressure or drainage or cough or fever or chills.  She is is worried about it because she is about to go to the beach next week and does not want to get sick out towards the beach.  She denies any sick contacts that she knows of.  She denies any fevers or chills or shortness of breath or wheezing.  She denies any significant pain with that but just feels plugged up.  She tried to wash it at home with an over-the-counter treatment as well.  Relevant past medical, surgical, family and social history reviewed and updated as indicated. Interim medical history since our last visit reviewed. Allergies and medications reviewed and updated.  Review of Systems  Constitutional:  Negative for chills and fever.  HENT:  Positive for congestion and hearing loss. Negative for ear discharge, ear pain, postnasal drip, rhinorrhea, sinus pressure, sneezing and sore throat.   Eyes:  Negative for pain, redness and visual disturbance.  Respiratory:  Negative for cough, chest tightness and shortness of breath.   Cardiovascular:  Negative for chest pain and leg swelling.  Genitourinary:  Negative for difficulty urinating and dysuria.  Musculoskeletal:  Negative for back pain and gait problem.  Skin:  Negative for rash.  Neurological:  Negative for light-headedness and headaches.   Psychiatric/Behavioral:  Negative for agitation and behavioral problems.   All other systems reviewed and are negative.   Per HPI unless specifically indicated above   Allergies as of 06/15/2023       Reactions   Codeine    Headache    Femring [estradiol] Rash   Cephalexin Rash   Erythromycin Other (See Comments)   Gi  Upset    Penicillins Rash        Medication List        Accurate as of June 15, 2023  4:08 PM. If you have any questions, ask your nurse or doctor.          atorvastatin 20 MG tablet Commonly known as: LIPITOR Take 1 tablet (20 mg total) by mouth daily.   B-12 1000 MCG Subl Place under the tongue.   Calcium Citrate-Vitamin D 315-250 MG-UNIT Tabs Commonly known as: Calcium Citrate + D3 Maximum Take 1 tablet by mouth 2 (two) times daily. What changed: additional instructions   clobetasol 0.05 % external solution Commonly known as: TEMOVATE Apply 1 application topically daily as needed.   desonide 0.05 % cream Commonly known as: DesOwen Apply topically 2 (two) times daily. X1 week   diclofenac Sodium 1 % Gel Commonly known as: VOLTAREN Apply 4 g topically 4 (four) times daily.   ezetimibe 10 MG tablet Commonly known as: ZETIA Take 1 tablet (10 mg total)  by mouth daily.   gabapentin 100 MG capsule Commonly known as: NEURONTIN TAKE 1 CAPSULE EVERY MORNING AND 2 CAPSULES AT BEDTIME   GLUCOSAMINE CHONDR 1500 COMPLX PO Take by mouth.   icosapent Ethyl 1 g capsule Commonly known as: Vascepa TAKE (2) CAPSULES TWICE DAILY.   levothyroxine 50 MCG tablet Commonly known as: SYNTHROID Take 1/2 tablet on Mon, Wed, Fri and 1 tablet daily Tues and Thursday   losartan 100 MG tablet Commonly known as: COZAAR Take 1 tablet (100 mg total) by mouth daily.   meloxicam 15 MG tablet Commonly known as: MOBIC TAKE ONE TABLET DAILY AS NEEDED FOR PAIN   MULTI-VITAMIN PO Take by mouth daily.   omeprazole 20 MG capsule Commonly known as:  PRILOSEC Take 1 capsule (20 mg total) by mouth 2 (two) times daily as needed.   PROBIOTIC DAILY PO Take by mouth.   VITAMIN D PO Take 5,000 Units by mouth daily.   zinc gluconate 50 MG tablet Take 50 mg by mouth daily.         Objective:   BP (!) 144/79   Pulse 70   Temp 98.3 F (36.8 C)   Ht 5\' 1"  (1.549 m)   Wt 140 lb (63.5 kg)   SpO2 97%   BMI 26.45 kg/m   Wt Readings from Last 3 Encounters:  06/15/23 140 lb (63.5 kg)  02/16/23 136 lb (61.7 kg)  02/04/23 134 lb (60.8 kg)    Physical Exam Vitals and nursing note reviewed.  Constitutional:      General: She is not in acute distress.    Appearance: She is well-developed. She is not diaphoretic.  HENT:     Right Ear: No middle ear effusion. There is no impacted cerumen. No foreign body. Tympanic membrane is not injected or scarred.     Left Ear:  No middle ear effusion. There is impacted cerumen. No foreign body. Tympanic membrane is not injected or scarred.  Eyes:     Conjunctiva/sclera: Conjunctivae normal.  Musculoskeletal:        General: No tenderness. Normal range of motion.  Skin:    General: Skin is warm and dry.     Findings: No rash.  Neurological:     Mental Status: She is alert and oriented to person, place, and time.     Coordination: Coordination normal.  Psychiatric:        Behavior: Behavior normal.     Nurse to lavage, patient tolerated well.  Able to clear.  Assessment & Plan:   Problem List Items Addressed This Visit   None Visit Diagnoses     Hearing loss due to cerumen impaction, left    -  Primary   Acute swimmer's ear of left side           After cleaning ears out, there is some erythema in the ear canal and will give some drops for it.  Also recommended Flonase on top of her antihistamine to help with the congestion as well. Follow up plan: Return if symptoms worsen or fail to improve.  Counseling provided for all of the vaccine components No orders of the defined  types were placed in this encounter.   Arville Care, MD [facility] 06/15/2023, 4:08 PM

## 2023-06-15 NOTE — Telephone Encounter (Signed)
Copied from CRM [id number]. Topic: Appointments - Appointment Scheduling >> Jun 15, 2023  8:42 AM Larwance Sachs wrote: Patient/patient representative is calling to schedule an appointment. There is no availability until January and patient would like to be checked out today for ear infection like symptoms anytime after 11:30  Call patient back at [phone]

## 2023-07-25 ENCOUNTER — Other Ambulatory Visit: Payer: Self-pay | Admitting: Family Medicine

## 2023-07-25 DIAGNOSIS — E782 Mixed hyperlipidemia: Secondary | ICD-10-CM

## 2023-07-25 DIAGNOSIS — I700 Atherosclerosis of aorta: Secondary | ICD-10-CM

## 2023-08-03 DIAGNOSIS — R102 Pelvic and perineal pain: Secondary | ICD-10-CM | POA: Diagnosis not present

## 2023-08-11 DIAGNOSIS — D3131 Benign neoplasm of right choroid: Secondary | ICD-10-CM | POA: Diagnosis not present

## 2023-08-11 DIAGNOSIS — H25811 Combined forms of age-related cataract, right eye: Secondary | ICD-10-CM | POA: Diagnosis not present

## 2023-08-11 DIAGNOSIS — H353131 Nonexudative age-related macular degeneration, bilateral, early dry stage: Secondary | ICD-10-CM | POA: Diagnosis not present

## 2023-08-11 DIAGNOSIS — H04123 Dry eye syndrome of bilateral lacrimal glands: Secondary | ICD-10-CM | POA: Diagnosis not present

## 2023-08-11 DIAGNOSIS — H02831 Dermatochalasis of right upper eyelid: Secondary | ICD-10-CM | POA: Diagnosis not present

## 2023-08-11 DIAGNOSIS — H02834 Dermatochalasis of left upper eyelid: Secondary | ICD-10-CM | POA: Diagnosis not present

## 2023-08-15 DIAGNOSIS — M9902 Segmental and somatic dysfunction of thoracic region: Secondary | ICD-10-CM | POA: Diagnosis not present

## 2023-08-15 DIAGNOSIS — M62830 Muscle spasm of back: Secondary | ICD-10-CM | POA: Diagnosis not present

## 2023-08-15 DIAGNOSIS — M9901 Segmental and somatic dysfunction of cervical region: Secondary | ICD-10-CM | POA: Diagnosis not present

## 2023-08-15 DIAGNOSIS — M9903 Segmental and somatic dysfunction of lumbar region: Secondary | ICD-10-CM | POA: Diagnosis not present

## 2023-08-16 ENCOUNTER — Other Ambulatory Visit: Payer: Self-pay | Admitting: Family Medicine

## 2023-08-16 DIAGNOSIS — I700 Atherosclerosis of aorta: Secondary | ICD-10-CM

## 2023-08-16 DIAGNOSIS — E782 Mixed hyperlipidemia: Secondary | ICD-10-CM

## 2023-08-17 ENCOUNTER — Other Ambulatory Visit: Payer: Medicare PPO

## 2023-08-18 ENCOUNTER — Other Ambulatory Visit: Payer: Medicare PPO

## 2023-08-18 DIAGNOSIS — I10 Essential (primary) hypertension: Secondary | ICD-10-CM | POA: Diagnosis not present

## 2023-08-18 DIAGNOSIS — E782 Mixed hyperlipidemia: Secondary | ICD-10-CM | POA: Diagnosis not present

## 2023-08-18 DIAGNOSIS — K219 Gastro-esophageal reflux disease without esophagitis: Secondary | ICD-10-CM | POA: Diagnosis not present

## 2023-08-18 DIAGNOSIS — E034 Atrophy of thyroid (acquired): Secondary | ICD-10-CM

## 2023-08-18 DIAGNOSIS — E559 Vitamin D deficiency, unspecified: Secondary | ICD-10-CM

## 2023-08-18 DIAGNOSIS — I700 Atherosclerosis of aorta: Secondary | ICD-10-CM | POA: Diagnosis not present

## 2023-08-18 DIAGNOSIS — M85832 Other specified disorders of bone density and structure, left forearm: Secondary | ICD-10-CM

## 2023-08-19 ENCOUNTER — Ambulatory Visit: Payer: Medicare PPO | Admitting: Family Medicine

## 2023-08-19 ENCOUNTER — Ambulatory Visit (INDEPENDENT_AMBULATORY_CARE_PROVIDER_SITE_OTHER): Payer: Medicare PPO

## 2023-08-19 ENCOUNTER — Encounter: Payer: Self-pay | Admitting: Family Medicine

## 2023-08-19 VITALS — BP 153/73 | HR 76 | Temp 98.3°F | Ht 61.0 in | Wt 139.0 lb

## 2023-08-19 DIAGNOSIS — E782 Mixed hyperlipidemia: Secondary | ICD-10-CM | POA: Diagnosis not present

## 2023-08-19 DIAGNOSIS — E034 Atrophy of thyroid (acquired): Secondary | ICD-10-CM | POA: Diagnosis not present

## 2023-08-19 DIAGNOSIS — I10 Essential (primary) hypertension: Secondary | ICD-10-CM | POA: Diagnosis not present

## 2023-08-19 DIAGNOSIS — I700 Atherosclerosis of aorta: Secondary | ICD-10-CM | POA: Diagnosis not present

## 2023-08-19 DIAGNOSIS — M25551 Pain in right hip: Secondary | ICD-10-CM

## 2023-08-19 DIAGNOSIS — M160 Bilateral primary osteoarthritis of hip: Secondary | ICD-10-CM | POA: Diagnosis not present

## 2023-08-19 LAB — CMP14+EGFR
ALT: 39 [IU]/L — ABNORMAL HIGH (ref 0–32)
AST: 31 [IU]/L (ref 0–40)
Albumin: 4.8 g/dL (ref 3.8–4.8)
Alkaline Phosphatase: 92 [IU]/L (ref 44–121)
BUN/Creatinine Ratio: 18 (ref 12–28)
BUN: 11 mg/dL (ref 8–27)
Bilirubin Total: 0.4 mg/dL (ref 0.0–1.2)
CO2: 23 mmol/L (ref 20–29)
Calcium: 9.5 mg/dL (ref 8.7–10.3)
Chloride: 97 mmol/L (ref 96–106)
Creatinine, Ser: 0.61 mg/dL (ref 0.57–1.00)
Globulin, Total: 2.5 g/dL (ref 1.5–4.5)
Glucose: 95 mg/dL (ref 70–99)
Potassium: 4.6 mmol/L (ref 3.5–5.2)
Sodium: 138 mmol/L (ref 134–144)
Total Protein: 7.3 g/dL (ref 6.0–8.5)
eGFR: 92 mL/min/{1.73_m2} (ref >59)

## 2023-08-19 LAB — CBC WITH DIFFERENTIAL/PLATELET
Basophils Absolute: 0.1 10*3/uL (ref 0.0–0.2)
Basos: 1 %
EOS (ABSOLUTE): 0.1 10*3/uL (ref 0.0–0.4)
Eos: 2 %
Hematocrit: 38.1 % (ref 34.0–46.6)
Hemoglobin: 12.8 g/dL (ref 11.1–15.9)
Immature Grans (Abs): 0 10*3/uL (ref 0.0–0.1)
Immature Granulocytes: 0 %
Lymphocytes Absolute: 1.5 10*3/uL (ref 0.7–3.1)
Lymphs: 37 %
MCH: 30.9 pg (ref 26.6–33.0)
MCHC: 33.6 g/dL (ref 31.5–35.7)
MCV: 92 fL (ref 79–97)
Monocytes Absolute: 0.3 10*3/uL (ref 0.1–0.9)
Monocytes: 8 %
Neutrophils Absolute: 2.2 10*3/uL (ref 1.4–7.0)
Neutrophils: 52 %
Platelets: 308 10*3/uL (ref 150–450)
RBC: 4.14 x10E6/uL (ref 3.77–5.28)
RDW: 12 % (ref 11.7–15.4)
WBC: 4.1 10*3/uL (ref 3.4–10.8)

## 2023-08-19 LAB — T4, FREE: Free T4: 1.25 ng/dL (ref 0.82–1.77)

## 2023-08-19 LAB — VITAMIN D 25 HYDROXY (VIT D DEFICIENCY, FRACTURES): Vit D, 25-Hydroxy: 41.9 ng/mL (ref 30.0–100.0)

## 2023-08-19 LAB — LIPID PANEL
Chol/HDL Ratio: 2.4 {ratio} (ref 0.0–4.4)
Cholesterol, Total: 154 mg/dL (ref 100–199)
HDL: 65 mg/dL (ref >39)
LDL Chol Calc (NIH): 70 mg/dL (ref 0–99)
Triglycerides: 103 mg/dL (ref 0–149)
VLDL Cholesterol Cal: 19 mg/dL (ref 5–40)

## 2023-08-19 LAB — TSH: TSH: 2.76 u[IU]/mL (ref 0.450–4.500)

## 2023-08-19 MED ORDER — EZETIMIBE 10 MG PO TABS: 10.0000 mg | ORAL_TABLET | Freq: Every day | ORAL | 3 refills | Status: DC

## 2023-08-19 MED ORDER — ICOSAPENT ETHYL 1 G PO CAPS: 2.0000 g | ORAL_CAPSULE | Freq: Two times a day (BID) | ORAL | 3 refills | Status: DC

## 2023-08-19 MED ORDER — ATORVASTATIN CALCIUM 20 MG PO TABS: 20.0000 mg | ORAL_TABLET | Freq: Every day | ORAL | 3 refills | Status: DC

## 2023-08-19 NOTE — Progress Notes (Signed)
Subjective: CC: Chronic follow-up PCP: Raliegh Ip, DO BJY:NWGNF R Kowalkowski is a 78 y.o. female presenting to clinic today for:  1.  Hypertension associated with hyperlipidemia Compliant with all medications.  However she notes that she only got about 30-day supply of her Vascepa recently.  She denies any chest pain, shortness of breath.  Had labs done recently which showed persistent elevation in LFT but this has been previously worked up by gastroenterology and felt to be benign  2.  Hypothyroidism Compliant with Synthroid as directed.  No reports of tremor, heart palpitations or changes in bowel habits.  She has had some increase stress related to her husband's health  3.  Eye drooping She reports that she is been having increased tearing of the right eye.  She is under the care of an ophthalmologist and she will be seeing them in a couple of weeks for cataract surgery.  She is wondering if perhaps she has a plugged tear duct that might need to be treated  4.  Right hip pain She reports that she has been having increasing right hip pain that radiates into the groin.  This was initially felt to be secondary to chronic low back issues by her orthopedist.  She saw Dr. Geryl Councilman a while back but has not seen him recently.  Pain is worse with ambulating up steps and turning.  ROS: Per HPI  Allergies  Allergen Reactions   Codeine     Headache     Femring [Estradiol] Rash   Cephalexin Rash   Erythromycin Other (See Comments)    Gi  Upset     Penicillins Rash   Past Medical History:  Diagnosis Date   Acid reflux    Allergy    Arthritis    Diverticulosis    GERD (gastroesophageal reflux disease)    History of ETT 7/10   abnormal    History of stomach ulcers 2008   positive h. pylori   Hyperlipidemia    Hypertension    Hypothyroidism    IBS (irritable bowel syndrome)    Interstitial cystitis    Skin cancer    basal and squamous    Current Outpatient Medications:     atorvastatin (LIPITOR) 20 MG tablet, Take 1 tablet (20 mg total) by mouth daily., Disp: 90 tablet, Rfl: 3   Calcium Citrate-Vitamin D (CALCIUM CITRATE + D3 MAXIMUM) 315-250 MG-UNIT TABS, Take 1 tablet by mouth 2 (two) times daily. (Patient taking differently: Take 1 tablet by mouth 2 (two) times daily. With magnesium), Disp: 120 tablet, Rfl:    Cholecalciferol (VITAMIN D PO), Take 5,000 Units by mouth daily., Disp: , Rfl:    clobetasol (TEMOVATE) 0.05 % external solution, Apply 1 application topically daily as needed. , Disp: , Rfl:    Cyanocobalamin (B-12) 1000 MCG SUBL, Place under the tongue., Disp: , Rfl:    desonide (DESOWEN) 0.05 % cream, Apply topically 2 (two) times daily. X1 week, Disp: 30 g, Rfl: 0   diclofenac Sodium (VOLTAREN) 1 % GEL, Apply 4 g topically 4 (four) times daily., Disp: , Rfl:    ezetimibe (ZETIA) 10 MG tablet, Take 1 tablet (10 mg total) by mouth daily., Disp: 90 tablet, Rfl: 3   gabapentin (NEURONTIN) 100 MG capsule, TAKE 1 CAPSULE EVERY MORNING AND 2 CAPSULES AT BEDTIME, Disp: 270 capsule, Rfl: 1   Glucosamine-Chondroit-Vit C-Mn (GLUCOSAMINE CHONDR 1500 COMPLX PO), Take by mouth., Disp: , Rfl:    icosapent Ethyl (VASCEPA) 1 g capsule, TAKE  TWO CAPSULES TWICE DAILY, Disp: 120 capsule, Rfl: 0   levothyroxine (SYNTHROID) 50 MCG tablet, Take 1/2 tablet on Mon, Wed, Fri and 1 tablet daily Tues and Thursday, Disp: 90 tablet, Rfl: 3   losartan (COZAAR) 100 MG tablet, Take 1 tablet (100 mg total) by mouth daily., Disp: 90 tablet, Rfl: 3   meloxicam (MOBIC) 15 MG tablet, TAKE ONE TABLET DAILY AS NEEDED FOR PAIN, Disp: 90 tablet, Rfl: 0   Multiple Vitamin (MULTI-VITAMIN PO), Take by mouth daily., Disp: , Rfl:    omeprazole (PRILOSEC) 20 MG capsule, Take 1 capsule (20 mg total) by mouth 2 (two) times daily as needed., Disp: 180 capsule, Rfl: 3   Probiotic Product (PROBIOTIC DAILY PO), Take by mouth., Disp: , Rfl:    zinc gluconate 50 MG tablet, Take 50 mg by mouth daily., Disp:  , Rfl:   Current Facility-Administered Medications:    methylPREDNISolone acetate (DEPO-MEDROL) injection 40 mg, 40 mg, Intramuscular, Once, Raliegh Ip, DO Social History   Socioeconomic History   Marital status: Married    Spouse name: Earnestine Leys"   Number of children: 1   Years of education: 16   Highest education level: Bachelor's degree (e.g., BA, AB, BS)  Occupational History   Occupation: Retired    Associate Professor: Tour manager    Comment: Teacher  Tobacco Use   Smoking status: Never   Smokeless tobacco: Never  Vaping Use   Vaping status: Never Used  Substance and Sexual Activity   Alcohol use: Yes    Alcohol/week: 7.0 standard drinks of alcohol    Types: 7 Glasses of wine per week    Comment: 1 daily   Drug use: No   Sexual activity: Never  Other Topics Concern   Not on file  Social History Narrative   Lives home with her husband "Tammy Sours" Their son lives in [place]. One grandchild   Social Drivers of Corporate investment banker Strain: Low Risk  (08/17/2023)   Overall Financial Resource Strain (CARDIA)    Difficulty of Paying Living Expenses: Not hard at all  Food Insecurity: No Food Insecurity (08/17/2023)   Hunger Vital Sign    Worried About Running Out of Food in the Last Year: Never true    Ran Out of Food in the Last Year: Never true  Transportation Needs: No Transportation Needs (08/17/2023)   PRAPARE - Administrator, Civil Service (Medical): No    Lack of Transportation (Non-Medical): No  Physical Activity: Sufficiently Active (08/17/2023)   Exercise Vital Sign    Days of Exercise per Week: 6 days    Minutes of Exercise per Session: 30 min  Stress: No Stress Concern Present (08/17/2023)   [employer] of Occupational Health - Occupational Stress Questionnaire    Feeling of Stress : Not at all  Social Connections: Socially Integrated (08/17/2023)   Social Connection and Isolation Panel [NHANES]    Frequency of Communication with  Friends and Family: More than three times a week    Frequency of Social Gatherings with Friends and Family: Twice a week    Attends Religious Services: More than 4 times per year    Active Member of [employer] or Organizations: Yes    Attends Banker Meetings: More than 4 times per year    Marital Status: Married  Catering manager Violence: Not At Risk (01/29/2022)   Humiliation, Afraid, Rape, and Kick questionnaire    Fear of Current or Ex-Partner: No    Emotionally  Abused: No    Physically Abused: No    Sexually Abused: No   Family History  Problem Relation Age of Onset   Diabetes Father    Heart disease Father    Heart attack Father 10   Breast cancer Mother    Breast cancer Paternal Grandmother        Aunt also    Breast cancer Other        Family History   Breast cancer Maternal Aunt    Breast cancer Paternal Aunt    Colon cancer Neg Hx    Esophageal cancer Neg Hx    Rectal cancer Neg Hx    Stomach cancer Neg Hx     Objective: Office vital signs reviewed. BP (!) 153/73   Pulse 76   Temp 98.3 F (36.8 C)   Ht 5\' 1"  (1.549 m)   SpO2 99%   BMI 26.45 kg/m   Physical Examination:  General: Awake, alert, well nourished, No acute distress HEENT: sclera white, MMM.  Increased tear production is appreciated on the right. Cardio: regular rate and rhythm, S1S2 heard, no murmurs appreciated Pulm: clear to auscultation bilaterally, no wheezes, rhonchi or rales; normal work of breathing on room air GI: soft, non-tender, non-distended, bowel sounds present x4, no hepatomegaly, no splenomegaly, no masses MSK: The independently with fairly normal gait and station     08/19/2023   11:01 AM 06/15/2023    3:48 PM 06/15/2023    3:47 PM  Depression screen PHQ 2/9  Decreased Interest 0 0 0  Down, Depressed, Hopeless 0 0 0  PHQ - 2 Score 0 0 0  Altered sleeping 0 0   Tired, decreased energy 0 0   Change in appetite 0 0   Feeling bad or failure about yourself  0 0    Trouble concentrating 0 0   Moving slowly or fidgety/restless 0 0   Suicidal thoughts 0 0   PHQ-9 Score 0 0   Difficult doing work/chores Not difficult at all Not difficult at all    No results found.   Assessment/ Plan: 78 y.o. female   Hypothyroidism due to acquired atrophy of thyroid - Plan: TSH + free T4  Aortic atherosclerosis (HCC) - Plan: icosapent Ethyl (VASCEPA) 1 g capsule, CMP14+EGFR, Lipid panel, atorvastatin (LIPITOR) 20 MG tablet, ezetimibe (ZETIA) 10 MG tablet  Mixed hyperlipidemia - Plan: icosapent Ethyl (VASCEPA) 1 g capsule, CMP14+EGFR, Lipid panel, atorvastatin (LIPITOR) 20 MG tablet, ezetimibe (ZETIA) 10 MG tablet  Primary hypertension - Plan: CMP14+EGFR  Right hip pain - Plan: DG Hip Unilat W OR W/O Pelvis 2-3 Views Right  Euthymic on exam.  We reviewed her labs which were unremarkable except for mild elevation LFT which has been chronic and stable for her  We reviewed her cholesterol levels which were appropriate.  Renewal of all medications have been sent  Her blood pressure was not at goal today.  I would like her to return for repeat blood pressure at some point.  May need to consider advancing therapy.  Plain films of right hip obtained.  Will reach out to patient and CC to orthopedic doctor once available  Raliegh Ip, DO [facility] [phone]

## 2023-08-24 ENCOUNTER — Encounter: Payer: Self-pay | Admitting: Family Medicine

## 2023-08-24 DIAGNOSIS — M9902 Segmental and somatic dysfunction of thoracic region: Secondary | ICD-10-CM | POA: Diagnosis not present

## 2023-08-24 DIAGNOSIS — M9903 Segmental and somatic dysfunction of lumbar region: Secondary | ICD-10-CM | POA: Diagnosis not present

## 2023-08-24 DIAGNOSIS — M62830 Muscle spasm of back: Secondary | ICD-10-CM | POA: Diagnosis not present

## 2023-08-24 DIAGNOSIS — M9901 Segmental and somatic dysfunction of cervical region: Secondary | ICD-10-CM | POA: Diagnosis not present

## 2023-08-29 ENCOUNTER — Encounter: Payer: Self-pay | Admitting: Family Medicine

## 2023-08-31 DIAGNOSIS — M25551 Pain in right hip: Secondary | ICD-10-CM | POA: Diagnosis not present

## 2023-09-06 DIAGNOSIS — M62830 Muscle spasm of back: Secondary | ICD-10-CM | POA: Diagnosis not present

## 2023-09-06 DIAGNOSIS — M9903 Segmental and somatic dysfunction of lumbar region: Secondary | ICD-10-CM | POA: Diagnosis not present

## 2023-09-06 DIAGNOSIS — M9901 Segmental and somatic dysfunction of cervical region: Secondary | ICD-10-CM | POA: Diagnosis not present

## 2023-09-06 DIAGNOSIS — M9902 Segmental and somatic dysfunction of thoracic region: Secondary | ICD-10-CM | POA: Diagnosis not present

## 2023-09-09 DIAGNOSIS — H25811 Combined forms of age-related cataract, right eye: Secondary | ICD-10-CM | POA: Diagnosis not present

## 2023-09-17 ENCOUNTER — Other Ambulatory Visit: Payer: Self-pay | Admitting: Family Medicine

## 2023-09-19 DIAGNOSIS — M9902 Segmental and somatic dysfunction of thoracic region: Secondary | ICD-10-CM | POA: Diagnosis not present

## 2023-09-19 DIAGNOSIS — M9903 Segmental and somatic dysfunction of lumbar region: Secondary | ICD-10-CM | POA: Diagnosis not present

## 2023-09-19 DIAGNOSIS — M62830 Muscle spasm of back: Secondary | ICD-10-CM | POA: Diagnosis not present

## 2023-09-19 DIAGNOSIS — M9901 Segmental and somatic dysfunction of cervical region: Secondary | ICD-10-CM | POA: Diagnosis not present

## 2023-09-21 DIAGNOSIS — H2512 Age-related nuclear cataract, left eye: Secondary | ICD-10-CM | POA: Diagnosis not present

## 2023-09-23 ENCOUNTER — Encounter: Payer: Self-pay | Admitting: Family Medicine

## 2023-09-23 DIAGNOSIS — H25812 Combined forms of age-related cataract, left eye: Secondary | ICD-10-CM | POA: Diagnosis not present

## 2023-09-26 ENCOUNTER — Ambulatory Visit: Payer: Medicare PPO | Admitting: Family Medicine

## 2023-09-26 ENCOUNTER — Ambulatory Visit (HOSPITAL_COMMUNITY)
Admission: RE | Admit: 2023-09-26 | Discharge: 2023-09-26 | Disposition: A | Discharge status: Home / Self Care | Payer: Medicare PPO | Source: Ambulatory Visit | Attending: Family Medicine | Admitting: Family Medicine

## 2023-09-26 ENCOUNTER — Encounter: Payer: Self-pay | Admitting: Family Medicine

## 2023-09-26 VITALS — BP 179/79 | HR 60 | Temp 98.5°F | Ht 61.0 in | Wt 140.8 lb

## 2023-09-26 DIAGNOSIS — R1031 Right lower quadrant pain: Secondary | ICD-10-CM

## 2023-09-26 DIAGNOSIS — M1611 Unilateral primary osteoarthritis, right hip: Secondary | ICD-10-CM | POA: Diagnosis not present

## 2023-09-26 DIAGNOSIS — K429 Umbilical hernia without obstruction or gangrene: Secondary | ICD-10-CM | POA: Diagnosis not present

## 2023-09-26 DIAGNOSIS — Z90710 Acquired absence of both cervix and uterus: Secondary | ICD-10-CM | POA: Diagnosis not present

## 2023-09-26 MED ORDER — IOHEXOL 300 MG/ML  SOLN
100.0000 mL | Freq: Once | INTRAMUSCULAR | Status: AC | PRN
  Administered 2023-09-26: 100 mL via INTRAVENOUS

## 2023-09-26 MED ORDER — DICLOFENAC SODIUM 75 MG PO TBEC: 75.0000 mg | DELAYED_RELEASE_TABLET | Freq: Two times a day (BID) | ORAL | 0 refills | Status: DC | PRN

## 2023-09-26 MED ORDER — TRAMADOL HCL 50 MG PO TABS: 50.0000 mg | ORAL_TABLET | Freq: Three times a day (TID) | ORAL | 0 refills | Status: AC | PRN

## 2023-09-26 NOTE — Progress Notes (Addendum)
 Subjective: CC: Right lower quadrant abdominal pain PCP: Marissa Perez Marissa Perez is a 78 y.o. female presenting to clinic today for:  1.  Right lower quadrant abdominal pain Patient was seen back in January for same.  It is felt to be hip pain at that time and her x-rays did demonstrate osteoarthritis.  However she reports to me that the groin pain had worsened and it was now affecting more of the lower abdominal area rather than the hip itself.  She has had increasing difficulty walking, lifting the right lower extremity and she reports that sometimes extremely painful.  She did have a corticosteroid injection by orthopedics in that right hip which did give her a few days of temporary relief.  She denies any changes in bowel habits.  Suffers from chronic constipation that is intermittent.  No nausea, vomiting, blood in stool.  No measured fevers.  She still has her appendix.   ROS: Per HPI  Allergies  Allergen Reactions   Codeine     Headache     Femring [Estradiol] Rash   Cephalexin Rash   Erythromycin Other (See Comments)    Gi  Upset     Penicillins Rash   Past Medical History:  Diagnosis Date   Acid reflux    Allergy    Arthritis    Diverticulosis    GERD (gastroesophageal reflux disease)    History of ETT 7/10   abnormal    History of stomach ulcers 2008   positive h. pylori   Hyperlipidemia    Hypertension    Hypothyroidism    IBS (irritable bowel syndrome)    Interstitial cystitis    Skin cancer    basal and squamous    Current Outpatient Medications:    atorvastatin (LIPITOR) 20 MG tablet, Take 1 tablet (20 mg total) by mouth daily., Disp: 90 tablet, Rfl: 3   Calcium Citrate-Vitamin D (CALCIUM CITRATE + D3 MAXIMUM) 315-250 MG-UNIT TABS, Take 1 tablet by mouth 2 (two) times daily. (Patient taking differently: Take 1 tablet by mouth 2 (two) times daily. With magnesium), Disp: 120 tablet, Rfl:    Cholecalciferol (VITAMIN D PO), Take 5,000  Units by mouth daily., Disp: , Rfl:    clobetasol (TEMOVATE) 0.05 % external solution, Apply 1 application topically daily as needed. , Disp: , Rfl:    Cyanocobalamin (B-12) 1000 MCG SUBL, Place under the tongue., Disp: , Rfl:    desonide (DESOWEN) 0.05 % cream, Apply topically 2 (two) times daily. X1 week, Disp: 30 g, Rfl: 0   diclofenac Sodium (VOLTAREN) 1 % GEL, Apply 4 g topically 4 (four) times daily., Disp: , Rfl:    ezetimibe (ZETIA) 10 MG tablet, Take 1 tablet (10 mg total) by mouth daily., Disp: 90 tablet, Rfl: 3   gabapentin (NEURONTIN) 100 MG capsule, TAKE 1 CAPSULE EVERY MORNING AND 2 CAPSULES AT BEDTIME, Disp: 270 capsule, Rfl: 1   Glucosamine-Chondroit-Vit C-Mn (GLUCOSAMINE CHONDR 1500 COMPLX PO), Take by mouth., Disp: , Rfl:    icosapent Ethyl (VASCEPA) 1 g capsule, Take 2 capsules (2 g total) by mouth 2 (two) times daily., Disp: 360 capsule, Rfl: 3   levothyroxine (SYNTHROID) 50 MCG tablet, Take 1/2 tablet on Mon, Wed, Fri and 1 tablet daily Tues and Thursday, Disp: 90 tablet, Rfl: 3   losartan (COZAAR) 100 MG tablet, Take 1 tablet (100 mg total) by mouth daily., Disp: 90 tablet, Rfl: 3   meloxicam (MOBIC) 15 MG tablet, TAKE ONE TABLET DAILY  AS NEEDED FOR PAIN, Disp: 90 tablet, Rfl: 0   Multiple Vitamin (MULTI-VITAMIN PO), Take by mouth daily., Disp: , Rfl:    omeprazole (PRILOSEC) 20 MG capsule, Take 1 capsule (20 mg total) by mouth 2 (two) times daily as needed., Disp: 180 capsule, Rfl: 3   Probiotic Product (PROBIOTIC DAILY PO), Take by mouth., Disp: , Rfl:    zinc gluconate 50 MG tablet, Take 50 mg by mouth daily., Disp: , Rfl:  Social History   Socioeconomic History   Marital status: Married    Spouse name: Marissa Perez"   Number of children: 1   Years of education: 16   Highest education level: Bachelor's degree (e.g., BA, AB, BS)  Occupational History   Occupation: Retired    Associate Professor: Tour manager    Comment: Teacher  Tobacco Use   Smoking status: Never    Smokeless tobacco: Never  Vaping Use   Vaping status: Never Used  Substance and Sexual Activity   Alcohol use: Yes    Alcohol/week: 7.0 standard drinks of alcohol    Types: 7 Glasses of wine per week    Comment: 1 daily   Drug use: No   Sexual activity: Never  Other Topics Concern   Not on file  Social History Narrative   Lives home with her husband "Marissa Perez" Their son lives in [place]. One grandchild   Social Drivers of Corporate investment banker Strain: Low Risk  (08/17/2023)   Overall Financial Resource Strain (CARDIA)    Difficulty of Paying Living Expenses: Not hard at all  Food Insecurity: No Food Insecurity (08/17/2023)   Hunger Vital Sign    Worried About Running Out of Food in the Last Year: Never true    Ran Out of Food in the Last Year: Never true  Transportation Needs: No Transportation Needs (08/17/2023)   PRAPARE - Administrator, Civil Service (Medical): No    Lack of Transportation (Non-Medical): No  Physical Activity: Sufficiently Active (08/17/2023)   Exercise Vital Sign    Days of Exercise per Week: 6 days    Minutes of Exercise per Session: 30 min  Stress: No Stress Concern Present (08/17/2023)   [employer] of Occupational Health - Occupational Stress Questionnaire    Feeling of Stress : Not at all  Social Connections: Socially Integrated (08/17/2023)   Social Connection and Isolation Panel [NHANES]    Frequency of Communication with Friends and Family: More than three times a week    Frequency of Social Gatherings with Friends and Family: Twice a week    Attends Religious Services: More than 4 times per year    Active Member of [employer] or Organizations: Yes    Attends Engineer, structural: More than 4 times per year    Marital Status: Married  Catering manager Violence: Not At Risk (01/29/2022)   Humiliation, Afraid, Rape, and Kick questionnaire    Fear of Current or Ex-Partner: No    Emotionally Abused: No    Physically Abused: No     Sexually Abused: No   Family History  Problem Relation Age of Onset   Diabetes Father    Heart disease Father    Heart attack Father 52   Breast cancer Mother    Breast cancer Paternal Grandmother        Aunt also    Breast cancer Other        Family History   Breast cancer Maternal Aunt    Breast cancer  Paternal Aunt    Colon cancer Neg Hx    Esophageal cancer Neg Hx    Rectal cancer Neg Hx    Stomach cancer Neg Hx     Objective: Office vital signs reviewed. BP (!) 179/79   Pulse 60   Temp 98.5 F (36.9 C)   Ht 5\' 1"  (1.549 m)   Wt 140 lb 12.8 oz (63.9 kg)   SpO2 97%   BMI 26.60 kg/m   Physical Examination:  General: Awake, alert, well nourished, No acute distress GI: Flat, soft, tenderness to palpation to the right lower quadrant.  No guarding or rebound. MSK: Positive FADIR and positive FABER of the right hip.  Antalgic gait  CT ABDOMEN PELVIS W CONTRAST Result Date: 09/26/2023 CLINICAL DATA:  Right lower quadrant abdominal pain. EXAM: CT ABDOMEN AND PELVIS WITH CONTRAST TECHNIQUE: Multidetector CT imaging of the abdomen and pelvis was performed using the standard protocol following bolus administration of intravenous contrast. RADIATION DOSE REDUCTION: This exam was performed according to the departmental dose-optimization program which includes automated exposure control, adjustment of the mA and/or kV according to patient size and/or use of iterative reconstruction technique. CONTRAST:  OMNIPAQUE IOHEXOL 300 MG/ML  SOLN COMPARISON:  None FINDINGS: Lower chest: Unremarkable. Hepatobiliary: No focal liver abnormality is seen. No gallstones, gallbladder wall thickening, or biliary dilatation. Pancreas: Unremarkable. No pancreatic ductal dilatation or surrounding inflammatory changes. Spleen: Normal in size without focal abnormality. Adrenals/Urinary Tract: Adrenal glands are unremarkable. Kidneys are normal, without renal calculi, focal lesion, or hydronephrosis.  Bladder is unremarkable. Stomach/Bowel: Mildly prominent stool, most pronounced in the right colon, more coalescent in the region of the ileocecal valve, containing scattered small gas bubbles. Small appendix or appendectomy stump without evidence of appendicitis. Unremarkable stomach and small bowel. Vascular/Lymphatic: Atheromatous arterial calcifications without aneurysm. No enlarged lymph nodes. Reproductive: Status post hysterectomy. No adnexal masses. Other: Small umbilical/supraumbilical hernia containing fat. Musculoskeletal: Marked lumbar and lower thoracic spine degenerative changes with moderate dextroconvex thoracolumbar scoliosis. IMPRESSION: 1. No acute abnormality. 2. Mildly prominent stool, most pronounced in the right colon. 3. Small umbilical/supraumbilical hernia containing fat. 4. Marked lumbar and lower thoracic spine degenerative changes with moderate thoracolumbar scoliosis. Electronically Signed   By: Beckie Salts M.D.   On: 09/26/2023 15:22     Assessment/ Plan: 78 y.o. female   Right lower quadrant abdominal pain - Plan: CT ABDOMEN PELVIS W CONTRAST  Primary osteoarthritis of right hip  Cannot rule out intrapelvic pathology at this point given now radiation into the right lower abdomen.  She does have an intact appendix and I Perez question may be if this is presenting more as a positive obturator sign.  She has known osteoarthritic changes in the right hip and is status post corticosteroid injection with some temporary relief.  Physical exam positive for tenderness to palpation in the right lower quadrant as well as positive hip testing.  Stat CT abdomen pelvis with contrast to rule out any acute appendicitis or other peritoneal processes.  If negative, will need to see orthopedics again for further evaluation of that right hip   **addendum: Reviewed CT results with the patient.  No evidence of intra abdominal pathology except for some constipation.  I think again that this is  likely intra-articular pathology in that right hip.  I have sent over a different NSAID for the patient.  Advised against other NSAID use while taking.  Tramadol sent for as needed use.  She will reach out to her orthopedist  for next steps  The Narcotic Database has been reviewed.  There were no red flags.     Marissa Perez [facility] [phone]

## 2023-09-26 NOTE — Addendum Note (Signed)
 Addended by: Raliegh Ip on: 09/26/2023 03:47 PM   Modules accepted: Orders

## 2023-09-27 ENCOUNTER — Telehealth: Payer: Self-pay

## 2023-09-27 DIAGNOSIS — M85832 Other specified disorders of bone density and structure, left forearm: Secondary | ICD-10-CM

## 2023-09-27 MED ORDER — DENOSUMAB 60 MG/ML ~~LOC~~ SOSY: 60.0000 mg | PREFILLED_SYRINGE | Freq: Once | SUBCUTANEOUS | Status: DC

## 2023-09-27 NOTE — Telephone Encounter (Signed)
 Prolia ordered for PA team to review verification benefits

## 2023-09-27 NOTE — Telephone Encounter (Signed)
 Marissa Perez, the read on her last CT/ hip xrays just need to be printed and faxed to the provider that is requesting them. I will cc to Melissa to see if she is able to download images for them as well

## 2023-09-28 ENCOUNTER — Telehealth: Payer: Self-pay

## 2023-09-28 ENCOUNTER — Encounter: Payer: Self-pay | Admitting: Family Medicine

## 2023-09-28 DIAGNOSIS — M25551 Pain in right hip: Secondary | ICD-10-CM | POA: Diagnosis not present

## 2023-09-28 NOTE — Telephone Encounter (Signed)
 Prolia VOB initiated via [email or web address]

## 2023-09-29 ENCOUNTER — Other Ambulatory Visit (HOSPITAL_COMMUNITY): Payer: Self-pay

## 2023-09-29 NOTE — Telephone Encounter (Signed)
 [name] Kitchen

## 2023-09-29 NOTE — Telephone Encounter (Signed)
 Pharmacy Patient Advocate Encounter  Received notification from [facility] that Prior Authorization for PROLIA has been APPROVED from 09/30/23 to 08/01/24. Ran test claim, Copay is $64. This test claim was processed through [facility]- copay amounts may vary at other pharmacies due to pharmacy/plan contracts, or as the patient moves through the different stages of their insurance plan.   PA #/Case ID/Reference #: [id number]

## 2023-09-29 NOTE — Telephone Encounter (Signed)
 Pharmacy Patient Advocate Encounter   Received notification from  [facility] Portal that prior authorization for PROLIA is required/requested.   Insurance verification completed.   The patient is insured through [employer] .   Per test claim: PA required; PA submitted to above mentioned insurance via CoverMyMeds Key/confirmation #/EOC [id number] Status is pending

## 2023-09-29 NOTE — Telephone Encounter (Signed)
 Pt ready for scheduling for PROLIA on or after : 09/30/23  Option# 1: Buy/Bill (Office supplied medication)  Out-of-pocket cost due at time of clinic visit: $40  Number of injection/visits approved: 2  Primary: [employer] Prolia co-insurance: $40 Admin fee co-insurance: 0%  Secondary: --- Prolia co-insurance:  Admin fee co-insurance:   Medical Benefit Details: Date Benefits were checked: 09/28/23 Deductible: NO/ Coinsurance: $40/ Admin Fee: 0%  Prior Auth: APPROVED PA# [id number] Expiration Date: 09/30/23-08/01/24   # of doses approved: 2 ----------------------------------------------------------------------- Option# 2- Med Obtained from pharmacy:  Pharmacy benefit: Copay $64 (Paid to pharmacy) Admin Fee: 0% (Pay at clinic)  Prior Auth: N/A PA# Expiration Date:   # of doses approved:   If patient wants fill through the pharmacy benefit please send prescription to: [employer], and include estimated need by date in rx notes. Pharmacy will ship medication directly to the office.  Patient NOT eligible for Prolia Copay Card. Copay Card can make patient's cost as little as $25. Link to apply: [email or web address]://[email or web address]  ** This summary of benefits is an estimation of the patient's out-of-pocket cost. Exact cost may very based on individual plan coverage.

## 2023-10-01 HISTORY — PX: EYE SURGERY: SHX253

## 2023-10-06 ENCOUNTER — Encounter: Payer: Self-pay | Admitting: Family Medicine

## 2023-10-06 DIAGNOSIS — M1611 Unilateral primary osteoarthritis, right hip: Secondary | ICD-10-CM

## 2023-10-06 DIAGNOSIS — I10 Essential (primary) hypertension: Secondary | ICD-10-CM

## 2023-10-07 MED ORDER — DICLOFENAC SODIUM 75 MG PO TBEC: 75.0000 mg | DELAYED_RELEASE_TABLET | Freq: Two times a day (BID) | ORAL | 3 refills | Status: DC | PRN

## 2023-10-07 MED ORDER — AMLODIPINE BESYLATE 5 MG PO TABS: 5.0000 mg | ORAL_TABLET | Freq: Every day | ORAL | 3 refills | Status: DC

## 2023-10-09 ENCOUNTER — Encounter: Payer: Self-pay | Admitting: Family Medicine

## 2023-10-10 ENCOUNTER — Ambulatory Visit: Payer: Medicare PPO

## 2023-10-10 NOTE — Telephone Encounter (Signed)
 Please don't forget to follow up

## 2023-10-18 ENCOUNTER — Ambulatory Visit: Payer: Medicare PPO | Admitting: Family Medicine

## 2023-10-18 DIAGNOSIS — M7551 Bursitis of right shoulder: Secondary | ICD-10-CM | POA: Diagnosis not present

## 2023-10-18 NOTE — Progress Notes (Signed)
 Patient comes in today to get her BP check. Her BP was taken in her left arm and was 136/73 and Heart rate 86. She had taken bother her amlodipine and losartan this morning.

## 2023-10-25 ENCOUNTER — Encounter: Payer: Self-pay | Admitting: Family Medicine

## 2023-10-25 ENCOUNTER — Ambulatory Visit: Admitting: Nurse Practitioner

## 2023-11-06 NOTE — Progress Notes (Signed)
 COVID Vaccine received:  []  No [x]  Yes Date of any COVID positive Test in last 90 days:  PCP - Delynn Flavin, DO Cardiologist -   Chest x-ray - 08-13-2020   2v  Epic EKG -  01-12-2017 Epic  will repeat  Stress Test -  ECHO -  Cardiac Cath -   PCR screen: [x]  Ordered & Completed []   No Order but Needs PROFEND     []   N/A for this surgery  Surgery Plan:  []  Ambulatory   [x]  Outpatient in bed  []  Admit Anesthesia:    []  General  []  Spinal  [x]   Choice []   MAC  Pacemaker / ICD device [x]  No []  Yes   Spinal Cord Stimulator:[x]  No []  Yes       History of Sleep Apnea? [x]  No []  Yes   CPAP used?- [x]  No []  Yes    Does the patient monitor blood sugar?   [x]  N/A   []  No []  Yes  Patient has: [x]  NO Hx DM   []  Pre-DM   []  DM1  []   DM2  Blood Thinner / Instructions: None Aspirin Instructions:  None  ERAS Protocol Ordered: []  No  [x]  Yes PRE-SURGERY [x]  ENSURE  []  G2   Patient is to be NPO after: 0800  Dental hx: []  Dentures:  []  N/A      []  Bridge or Partial:                   []  Loose or Damaged teeth:   Comments: Patient was given the 5 CHG shower / bath instructions for THA  surgery along with 2 bottles of the CHG soap. Patient will start this on: 11-12-23 All questions were asked and answered, Patient voiced understanding of this process.   Activity level: Patient is able / unable to climb a flight of stairs without difficulty; []  No CP  []  No SOB, but would have ___   Patient can / can not perform ADLs without assistance.   Anesthesia review: HTN, GERD,   Patient denies shortness of breath, fever, cough and chest pain at PAT appointment.  Patient verbalized understanding and agreement to the Pre-Surgical Instructions that were given to them at this PAT appointment. Patient was also educated of the need to review these PAT instructions again prior to her surgery.I reviewed the appropriate phone numbers to call if they have any and questions or concerns.

## 2023-11-06 NOTE — Patient Instructions (Signed)
 SURGICAL WAITING ROOM VISITATION Patients having surgery or a procedure may have no more than 2 support people in the waiting area - these visitors may rotate in the visitor waiting room.   If the patient needs to stay at the hospital during part of their recovery, the visitor guidelines for inpatient rooms apply.  PRE-OP VISITATION  Pre-op nurse will coordinate an appropriate time for 1 support person to accompany the patient in pre-op.  This support person may not rotate.  This visitor will be contacted when the time is appropriate for the visitor to come back in the pre-op area.  Please refer to the [facility] website for the visitor guidelines for Inpatients (after your surgery is over and you are in a regular room).  You are not required to quarantine at this time prior to your surgery. However, you must do this: Hand Hygiene often Do NOT share personal items Notify your provider if you are in close contact with someone who has COVID or you develop fever 100.4 or greater, new onset of sneezing, cough, sore throat, shortness of breath or body aches.  If you test positive for Covid or have been in contact with anyone that has tested positive in the last 10 days please notify you surgeon.    Your procedure is scheduled on:  [facility]  November 16, 2023  Report to [facility] Main Entrance: [address] entrance where the [employer] is available.   Report to admitting at: 08:30 AM  Call this number if you have any questions or problems the morning of surgery [phone]  Do not eat food after Midnight the night prior to your surgery/procedure.  After Midnight you may have the following liquids until   08:00 AM DAY OF SURGERY  Clear Liquid Diet Water Black Coffee (sugar ok, NO MILK/CREAM OR CREAMERS)  Tea (sugar ok, NO MILK/CREAM OR CREAMERS) regular and decaf                             Plain Jell-O  with no fruit (NO RED)                                           Fruit ices  (not with fruit pulp, NO RED)                                     Popsicles (NO RED)                                                                  Juice: NO CITRUS JUICES: only apple, WHITE grape, WHITE cranberry Sports drinks like Gatorade or Powerade (NO RED)                   The day of surgery:  Drink ONE (1) Pre-Surgery Clear Ensure at  08:00 AM the morning of surgery. Drink in one sitting. Do not sip.  This drink was given to you during your hospital pre-op appointment visit. Nothing else to drink after completing the Pre-Surgery  Clear Ensure : No candy, chewing gum or throat lozenges.    FOLLOW ANY ADDITIONAL PRE OP INSTRUCTIONS YOU RECEIVED FROM YOUR SURGEON'S OFFICE!!!   Oral Hygiene is also important to reduce your risk of infection.        Remember - BRUSH YOUR TEETH THE MORNING OF SURGERY WITH YOUR REGULAR TOOTHPASTE  Do NOT smoke after Midnight the night before surgery.  STOP TAKING all Vitamins, Herbs and supplements 1 week before your surgery.   Take ONLY these medicines the morning of surgery with A SIP OF WATER: omeprazole, levothyroxine, amlodipine, gabapentin and Tramadol if needed for pain.  You may use your Eye Drops if needed.                   You may not have any metal on your body including hair pins, jewelry, and body piercing  Do not wear make-up, lotions, powders, perfumes  or deodorant  Do not wear nail polish including gel and S&S, artificial / acrylic nails, or any other type of covering on natural nails including finger and toenails. If you have artificial nails, gel coating, etc., that needs to be removed by a nail salon, Please have this removed prior to surgery. Not doing so may mean that your surgery could be cancelled or delayed if the Surgeon or anesthesia staff feels like they are unable to monitor you safely.   Do not shave 48 hours prior to surgery to avoid nicks in your skin which may contribute to postoperative infections.   Contacts,  Hearing Aids, dentures or bridgework may not be worn into surgery. DENTURES WILL BE REMOVED PRIOR TO SURGERY PLEASE DO NOT APPLY "Poly grip" OR ADHESIVES!!!  You may bring a small overnight bag with you on the day of surgery, only pack items that are not valuable. [facility] IS NOT RESPONSIBLE   FOR VALUABLES THAT ARE LOST OR STOLEN.   Do not bring your home medications to the hospital. The Pharmacy will dispense medications listed on your medication list to you during your admission in the Hospital.  Special Instructions: Bring a copy of your healthcare power of attorney and living will documents the day of surgery, if you wish to have them scanned into your [facility]- EPIC  Please read over the following fact sheets you were given: IF YOU HAVE QUESTIONS ABOUT YOUR PRE-OP INSTRUCTIONS, PLEASE CALL [phone].     Pre-operative 5 CHG Bath Instructions   You can play a key role in reducing the risk of infection after surgery. Your skin needs to be as free of germs as possible. You can reduce the number of germs on your skin by washing with CHG (chlorhexidine gluconate) soap before surgery. CHG is an antiseptic soap that kills germs and continues to kill germs even after washing.   DO NOT use if you have an allergy to chlorhexidine/CHG or antibacterial soaps. If your skin becomes reddened or irritated, stop using the CHG and notify one of our RNs at [phone]  Please shower with the CHG soap starting 4 days before surgery using the following schedule: START SHOWERS ON   SATURDAY  November 12, 2023  Please keep in mind the following:  DO NOT shave, including legs and underarms, starting the day of your first shower.   You may shave your face at any point before/day of surgery.   Place clean sheets on  your bed the day you start using CHG soap. Use a clean washcloth (not used since being washed) for each shower. DO NOT sleep with pets once you start using the CHG.   CHG Shower Instructions:  If you choose to wash your hair and private area, wash first with your normal shampoo/soap.  After you use shampoo/soap, rinse your hair and body thoroughly to remove shampoo/soap residue.  Turn the water OFF and apply about 3 tablespoons (45 ml) of CHG soap to a CLEAN washcloth.  Apply CHG soap ONLY FROM YOUR NECK DOWN TO YOUR TOES (washing for 3-5 minutes)  DO NOT use CHG soap on face, private areas, open wounds, or sores.  Pay special attention to the area where your surgery is being performed.  If you are having back surgery, having someone wash your back for you may be helpful.  Wait 2 minutes after CHG soap is applied, then you may rinse off the CHG soap.  Pat dry with a clean towel  Put on clean clothes/pajamas   If you choose to wear lotion, please use ONLY the CHG-compatible lotions on the back of this paper.     Additional instructions for the day of surgery: DO NOT APPLY any lotions, deodorants, cologne, or perfumes.   Put on clean/comfortable clothes.  Brush your teeth.  Ask your nurse before applying any prescription medications to the skin.      CHG Compatible Lotions   Aveeno Moisturizing lotion  Cetaphil Moisturizing Cream  Cetaphil Moisturizing Lotion  Clairol Herbal Essence Moisturizing Lotion, Dry Skin  Clairol Herbal Essence Moisturizing Lotion, Extra Dry Skin  Clairol Herbal Essence Moisturizing Lotion, Normal Skin  Curel Age Defying Therapeutic Moisturizing Lotion with Alpha Hydroxy  Curel Extreme Care Body Lotion  Curel Soothing Hands Moisturizing Hand Lotion  Curel Therapeutic Moisturizing Cream, Fragrance-Free  Curel Therapeutic Moisturizing Lotion, Fragrance-Free  Curel Therapeutic Moisturizing Lotion, Original Formula  Eucerin Daily Replenishing Lotion   Eucerin Dry Skin Therapy Plus Alpha Hydroxy Crme  Eucerin Dry Skin Therapy Plus Alpha Hydroxy Lotion  Eucerin Original Crme  Eucerin Original Lotion  Eucerin Plus Crme Eucerin Plus Lotion  Eucerin TriLipid Replenishing Lotion  Keri Anti-Bacterial Hand Lotion  Keri Deep Conditioning Original Lotion Dry Skin Formula Softly Scented  Keri Deep Conditioning Original Lotion, Fragrance Free Sensitive Skin Formula  Keri Lotion Fast Absorbing Fragrance Free Sensitive Skin Formula  Keri Lotion Fast Absorbing Softly Scented Dry Skin Formula  Keri Original Lotion  Keri Skin Renewal Lotion Keri Silky Smooth Lotion  Keri Silky Smooth Sensitive Skin Lotion  Nivea Body Creamy Conditioning Oil  Nivea Body Extra Enriched Lotion  Nivea Body Original Lotion  Nivea Body Sheer Moisturizing Lotion Nivea Crme  Nivea Skin Firming Lotion  NutraDerm 30 Skin Lotion  NutraDerm Skin Lotion  NutraDerm Therapeutic Skin Cream  NutraDerm Therapeutic Skin Lotion  ProShield Protective Hand Cream  Provon moisturizing lotion   FAILURE TO FOLLOW THESE INSTRUCTIONS MAY RESULT IN THE CANCELLATION OF YOUR SURGERY  PATIENT SIGNATURE_________________________________  NURSE SIGNATURE__________________________________  ________________________________________________________________________        [facility]    An incentive spirometer is a tool that can help keep your lungs clear and active. This tool measures how well you are filling your lungs with each breath.  Taking long deep breaths may help reverse or decrease the chance of developing breathing (pulmonary) problems (especially infection) following: A long period of time when you are unable to move or be active. BEFORE THE PROCEDURE  If the spirometer includes an indicator to show your best effort, your nurse or respiratory therapist will set it to a desired goal. If possible, sit up straight or lean slightly forward. Try not to slouch. Hold  the incentive spirometer in an upright position. INSTRUCTIONS FOR USE  Sit on the edge of your bed if possible, or sit up as far as you can in bed or on a chair. Hold the incentive spirometer in an upright position. Breathe out normally. Place the mouthpiece in your mouth and seal your lips tightly around it. Breathe in slowly and as deeply as possible, raising the piston or the ball toward the top of the column. Hold your breath for 3-5 seconds or for as long as possible. Allow the piston or ball to fall to the bottom of the column. Remove the mouthpiece from your mouth and breathe out normally. Rest for a few seconds and repeat Steps 1 through 7 at least 10 times every 1-2 hours when you are awake. Take your time and take a few normal breaths between deep breaths. The spirometer may include an indicator to show your best effort. Use the indicator as a goal to work toward during each repetition. After each set of 10 deep breaths, practice coughing to be sure your lungs are clear. If you have an incision (the cut made at the time of surgery), support your incision when coughing by placing a pillow or rolled up towels firmly against it. Once you are able to get out of bed, walk around indoors and cough well. You may stop using the incentive spirometer when instructed by your caregiver.  RISKS AND COMPLICATIONS Take your time so you do not get dizzy or light-headed. If you are in pain, you may need to take or ask for pain medication before doing incentive spirometry. It is harder to take a deep breath if you are having pain. AFTER USE Rest and breathe slowly and easily. It can be helpful to keep track of a log of your progress. Your caregiver can provide you with a simple table to help with this. If you are using the spirometer at home, follow these instructions: SEEK MEDICAL CARE IF:  You are having difficultly using the spirometer. You have trouble using the spirometer as often as  instructed. Your pain medication is not giving enough relief while using the spirometer. You develop fever of 100.5 F (38.1 C) or higher.                                                                                                    SEEK IMMEDIATE MEDICAL CARE IF:  You cough up bloody sputum that had not been present before. You develop fever of 102 F (38.9 C) or greater. You develop worsening pain at or near the incision site. MAKE SURE YOU:  Understand these instructions. Will watch your  condition. Will get help right away if you are not doing well or get worse. Document Released: 11/29/2006 Document Revised: 10/11/2011 Document Reviewed: 01/30/2007 [facility] Patient Information 2014 [place], [place].          WHAT IS A BLOOD TRANSFUSION? Blood Transfusion Information  A transfusion is the replacement of blood or some of its parts. Blood is made up of multiple cells which provide different functions. Red blood cells carry oxygen and are used for blood loss replacement. White blood cells fight against infection. Platelets control bleeding. Plasma helps clot blood. Other blood products are available for specialized needs, such as hemophilia or other clotting disorders. BEFORE THE TRANSFUSION  Who gives blood for transfusions?  Healthy volunteers who are fully evaluated to make sure their blood is safe. This is blood bank blood. Transfusion therapy is the safest it has ever been in the practice of medicine. Before blood is taken from a donor, a complete history is taken to make sure that person has no history of diseases nor engages in risky social behavior (examples are intravenous drug use or sexual activity with multiple partners). The donor's travel history is screened to minimize risk of transmitting infections, such as malaria. The donated blood is tested for signs of infectious diseases, such as HIV and hepatitis. The blood is then tested to be sure it is compatible with  you in order to minimize the chance of a transfusion reaction. If you or a relative donates blood, this is often done in anticipation of surgery and is not appropriate for emergency situations. It takes many days to process the donated blood. RISKS AND COMPLICATIONS Although transfusion therapy is very safe and saves many lives, the main dangers of transfusion include:  Getting an infectious disease. Developing a transfusion reaction. This is an allergic reaction to something in the blood you were given. Every precaution is taken to prevent this. The decision to have a blood transfusion has been considered carefully by your caregiver before blood is given. Blood is not given unless the benefits outweigh the risks. AFTER THE TRANSFUSION Right after receiving a blood transfusion, you will usually feel much better and more energetic. This is especially true if your red blood cells have gotten low (anemic). The transfusion raises the level of the red blood cells which carry oxygen, and this usually causes an energy increase. The nurse administering the transfusion will monitor you carefully for complications. HOME CARE INSTRUCTIONS  No special instructions are needed after a transfusion. You may find your energy is better. Speak with your caregiver about any limitations on activity for underlying diseases you may have. SEEK MEDICAL CARE IF:  Your condition is not improving after your transfusion. You develop redness or irritation at the intravenous (IV) site. SEEK IMMEDIATE MEDICAL CARE IF:  Any of the following symptoms occur over the next 12 hours: Shaking chills. You have a temperature by mouth above 102 F (38.9 C), not controlled by medicine. Chest, back, or muscle pain. People around you feel you are not acting correctly or are confused. Shortness of breath or difficulty breathing. Dizziness and fainting. You get a rash or develop hives. You have a decrease in urine output. Your urine  turns a dark color or changes to pink, red, or brown. Any of the following symptoms occur over the next 10 days: You have a temperature by mouth above 102 F (38.9 C), not controlled by medicine. Shortness of breath. Weakness after normal activity. The white part of the eye turns  yellow (jaundice). You have a decrease in the amount of urine or are urinating less often. Your urine turns a dark color or changes to pink, red, or brown. Document Released: 07/16/2000 Document Revised: 10/11/2011 Document Reviewed: 03/04/2008 [facility] Patient Information 2014 [place], [place].  _______________________________________________________________________          If you would like to see a video about joint replacement:   [email or web address]

## 2023-11-07 ENCOUNTER — Encounter (HOSPITAL_COMMUNITY): Payer: Self-pay

## 2023-11-07 ENCOUNTER — Other Ambulatory Visit: Payer: Self-pay

## 2023-11-07 ENCOUNTER — Encounter (HOSPITAL_COMMUNITY)
Admission: RE | Admit: 2023-11-07 | Discharge: 2023-11-07 | Disposition: A | Discharge status: Home / Self Care | Source: Ambulatory Visit | Attending: Orthopedic Surgery | Admitting: Orthopedic Surgery

## 2023-11-07 VITALS — BP 130/58 | HR 68 | Temp 98.8°F | Resp 14 | Ht 61.0 in | Wt 131.0 lb

## 2023-11-07 DIAGNOSIS — Z01818 Encounter for other preprocedural examination: Secondary | ICD-10-CM | POA: Insufficient documentation

## 2023-11-07 DIAGNOSIS — I491 Atrial premature depolarization: Secondary | ICD-10-CM | POA: Diagnosis not present

## 2023-11-07 DIAGNOSIS — I10 Essential (primary) hypertension: Secondary | ICD-10-CM | POA: Insufficient documentation

## 2023-11-07 HISTORY — DX: Cardiac murmur, unspecified: R01.1

## 2023-11-07 LAB — CBC
HCT: 36.2 % (ref 36.0–46.0)
Hemoglobin: 11.9 g/dL — ABNORMAL LOW (ref 12.0–15.0)
MCH: 31.4 pg (ref 26.0–34.0)
MCHC: 32.9 g/dL (ref 30.0–36.0)
MCV: 95.5 fL (ref 80.0–100.0)
Platelets: 298 10*3/uL (ref 150–400)
RBC: 3.79 MIL/uL — ABNORMAL LOW (ref 3.87–5.11)
RDW: 13.2 % (ref 11.5–15.5)
WBC: 8 10*3/uL (ref 4.0–10.5)
nRBC: 0 % (ref 0.0–0.2)

## 2023-11-07 LAB — BASIC METABOLIC PANEL WITH GFR
Anion gap: 9 (ref 5–15)
BUN: 14 mg/dL (ref 8–23)
CO2: 25 mmol/L (ref 22–32)
Calcium: 9 mg/dL (ref 8.9–10.3)
Chloride: 94 mmol/L — ABNORMAL LOW (ref 98–111)
Creatinine, Ser: 0.61 mg/dL (ref 0.44–1.00)
GFR, Estimated: >60 mL/min (ref >60)
Glucose, Bld: 104 mg/dL — ABNORMAL HIGH (ref 70–99)
Potassium: 4.7 mmol/L (ref 3.5–5.1)
Sodium: 128 mmol/L — ABNORMAL LOW (ref 135–145)

## 2023-11-07 LAB — SURGICAL PCR SCREEN
MRSA, PCR: NEGATIVE
Staphylococcus aureus: NEGATIVE

## 2023-11-08 ENCOUNTER — Ambulatory Visit: Admitting: Family Medicine

## 2023-11-08 ENCOUNTER — Other Ambulatory Visit: Payer: Self-pay | Admitting: Family Medicine

## 2023-11-08 DIAGNOSIS — E871 Hypo-osmolality and hyponatremia: Secondary | ICD-10-CM

## 2023-11-08 DIAGNOSIS — D649 Anemia, unspecified: Secondary | ICD-10-CM

## 2023-11-08 NOTE — Progress Notes (Signed)
 Reviewed labs from preop clearance. Will order follow up. Possibly dilutional Please schedule for lab appt.  Orders Placed This Encounter  Procedures   Anemia Profile B    Standing Status:   Future    Expiration Date:   11/07/2024   Basic metabolic panel with GFR    Standing Status:   Future    Expiration Date:   11/07/2024

## 2023-11-09 ENCOUNTER — Telehealth: Payer: Self-pay | Admitting: Family Medicine

## 2023-11-09 NOTE — Telephone Encounter (Signed)
 Copied from CRM [id number]. Topic: General - Other >> Nov 09, 2023 10:26 AM Shelah Lewandowsky wrote: Reason for CRM: Lawson Fiscal with [facility], need medical clearance sent back before surgery on 4/16 Right hip replacement- [phone]

## 2023-11-09 NOTE — Telephone Encounter (Signed)
 Yes, [facility]elci faxed.

## 2023-11-10 ENCOUNTER — Telehealth: Payer: Self-pay

## 2023-11-10 NOTE — Telephone Encounter (Signed)
 This has been faxed and confirmed with the fax number on the form , which is the one provided by the pt   This is now the 4th time sending it over   Dr G or me will not be in office 04/11 I gave the copy with confirmation to covering providers nurse

## 2023-11-10 NOTE — Telephone Encounter (Signed)
 Copied from CRM [id number]. Topic: General - Other >> Nov 10, 2023  1:45 PM Alessandra Bevels wrote: Reason for CRM: Patient is calling to report that her surgical clearance form was not received by [facility]. Patient is calling upset And would like this to be completed soon. Please refax to [facility] [phone]-Fax

## 2023-11-10 NOTE — Anesthesia Preprocedure Evaluation (Addendum)
 Anesthesia Evaluation  Patient identified by MRN, date of birth, ID band Patient awake    Reviewed: Allergy & Precautions, NPO status , Patient's Chart, lab work & pertinent test results  Airway Mallampati: I  TM Distance: >3 FB Neck ROM: Full    Dental  (+) Dental Advisory Given, Teeth Intact   Pulmonary neg pulmonary ROS   Pulmonary exam normal breath sounds clear to auscultation       Cardiovascular hypertension, Normal cardiovascular exam+ Valvular Problems/Murmurs  Rhythm:Regular Rate:Normal     Neuro/Psych negative neurological ROS     GI/Hepatic Neg liver ROS,GERD  ,,  Endo/Other  Hypothyroidism    Renal/GU negative Renal ROS     Musculoskeletal  (+) Arthritis ,    Abdominal   Peds  Hematology negative hematology ROS (+)   Anesthesia Other Findings   Reproductive/Obstetrics                             Anesthesia Physical Anesthesia Plan  ASA: 3  Anesthesia Plan: Spinal   Post-op Pain Management: Ofirmev IV (intra-op)*   Induction: Intravenous  PONV Risk Score and Plan: 3 and Ondansetron, Dexamethasone, Treatment may vary due to age or medical condition, Propofol infusion and TIVA  Airway Management Planned: Natural Airway  Additional Equipment:   Intra-op Plan:   Post-operative Plan:   Informed Consent: I have reviewed the patients History and Physical, chart, labs and discussed the procedure including the risks, benefits and alternatives for the proposed anesthesia with the patient or authorized representative who has indicated his/her understanding and acceptance.     Dental advisory given  Plan Discussed with: CRNA  Anesthesia Plan Comments: (Moderate hyponatremia noted on preop labs with sodium 128, will get i-STAT on day of surgery.)        Anesthesia Quick Evaluation

## 2023-11-11 ENCOUNTER — Telehealth: Payer: Self-pay | Admitting: Family Medicine

## 2023-11-11 ENCOUNTER — Telehealth: Payer: Self-pay

## 2023-11-11 NOTE — Telephone Encounter (Signed)
 Copied from CRM [id number]. Topic: General - Other >> Nov 11, 2023  8:55 AM Georgeanna Harrison H wrote: Reason for CRM: [facility] called regarding the surgery clearance letter for [facility]. I read off the fax number that was given to the office and they stated they had no idea what that number went to.The correct fax number is [phone].

## 2023-11-11 NOTE — Telephone Encounter (Signed)
 Has been faxed to the correct number

## 2023-11-11 NOTE — Telephone Encounter (Signed)
 Copied from CRM [id number]. Topic: General - Other >> Nov 11, 2023  8:55 AM Georgeanna Harrison H wrote: Reason for CRM: [facility] called regarding the surgery clearance letter for [facility]. I read off the fax number that was given to the office and they stated they had no idea what that number went to.The correct fax number is [phone]. >> Nov 11, 2023  9:29 AM Bobbye Morton wrote: Lawson Fiscal - [facility] called in to ask if Office notes can be sent over with fax, only thing missing.

## 2023-11-11 NOTE — Telephone Encounter (Signed)
 Duplicate encounter. Already addressed. LS

## 2023-11-11 NOTE — Telephone Encounter (Signed)
 Addressed in a separate encounter. LS

## 2023-11-15 NOTE — H&P (Signed)
 TOTAL HIP ADMISSION H&P  Patient is admitted for right total hip arthroplasty.  Subjective:  Chief Complaint: Right hip pain  HPI: Marissa Perez, 78 y.o. female, has a history of pain and functional disability in the right hip due to arthritis and patient has failed non-surgical conservative treatments for greater than 12 weeks to include use of assistive devices and activity modification. Onset of symptoms was abrupt, starting 1 years ago with rapidlly worsening course since that time. The patient noted no past surgery on the right hip. Patient currently rates pain in the right hip at 9 out of 10 with activity. Patient has worsening of pain with activity and weight bearing and pain that interfers with activities of daily living. Patient has evidence of subchondral sclerosis, periarticular osteophytes, and joint space narrowing by imaging studies. This condition presents safety issues increasing the risk of falls. There is no current active infection.  Patient Active Problem List   Diagnosis Date Noted   Osteoarthritis of first carpometacarpal joint of left hand 01/15/2022   Osteoarthritis of first carpometacarpal joint of right hand 01/15/2022   Pain in right knee 08/22/2019   Osteoarthritis of multiple joints 01/25/2019   Lumbar spondylosis 12/28/2017   Degeneration of lumbar intervertebral disc 12/13/2017   Aortic atherosclerosis (HCC) 09/22/2017   Family history of breast cancer 07/05/2016   Skin cancer 07/05/2016   Vitamin D deficiency 07/05/2016   Hypothyroidism 12/11/2012   Hyperlipidemia 12/11/2012   Hypertension 12/11/2012   GERD (gastroesophageal reflux disease) 12/11/2012    Past Medical History:  Diagnosis Date   Acid reflux    Allergy    Arthritis    Diverticulosis    GERD (gastroesophageal reflux disease)    Heart murmur    as a child only   History of ETT 01/2009   abnormal    History of stomach ulcers 2008   positive h. pylori   Hyperlipidemia     Hypertension    Hypothyroidism    IBS (irritable bowel syndrome)    Interstitial cystitis    Skin cancer    basal and squamous    Past Surgical History:  Procedure Laterality Date   CESAREAN SECTION  08/02/1970   EYE SURGERY Bilateral 10/01/2023   cataract extraction w/ IOL   MOHS SURGERY  2005   scalp   TONSILLECTOMY  08/02/1949   TOTAL ABDOMINAL HYSTERECTOMY W/ BILATERAL SALPINGOOPHORECTOMY  08/02/1988   Dr. Felipe Horton / fibroids     Prior to Admission medications   Medication Sig Start Date End Date Taking? Authorizing Provider  amLODipine (NORVASC) 5 MG tablet Take 1 tablet (5 mg total) by mouth daily. With losartan for BP 10/07/23  Yes Gottschalk, Ashly M, DO  atorvastatin (LIPITOR) 20 MG tablet Take 1 tablet (20 mg total) by mouth daily. Patient taking differently: Take 20 mg by mouth every evening. 08/19/23  Yes Vicky Grange M, DO  Calcium-Magnesium (CAL/MAG PO) Take 1 capsule by mouth in the morning and at bedtime.  Calcium & Magnesium Mineral Complex 750 mg   Yes [provider]  Cholecalciferol (VITAMIN D-3) 125 MCG (5000 UT) TABS Take 5,000 Units by mouth in the morning.   Yes [provider]  clobetasol (TEMOVATE) 0.05 % external solution Apply 1 application  topically daily as needed (scalp irritation). 10/23/13  Yes [provider]  cyanocobalamin (VITAMIN B12) 1000 MCG tablet Take 10,000 mcg by mouth in the morning.   Yes [provider]  diclofenac (VOLTAREN) 75 MG EC tablet Take 1  tablet (75 mg total) by mouth 2 (two) times daily as needed for moderate pain (pain score 4-6). Patient taking differently: Take 75 mg by mouth in the morning and at bedtime. 10/07/23  Yes Vicky Grange M, DO  ezetimibe (ZETIA) 10 MG tablet Take 1 tablet (10 mg total) by mouth daily. Patient taking differently: Take 10 mg by mouth every evening. 08/19/23  Yes Gottschalk, Sharolyn Decant M, DO  gabapentin (NEURONTIN) 100 MG capsule TAKE 1 CAPSULE EVERY MORNING AND 2  CAPSULES AT BEDTIME 09/19/23  Yes Gottschalk, Ashly M, DO  Glucosamine-Chondroitin (COSAMIN DS PO) Take 1 tablet by mouth in the morning and at bedtime.   Yes [provider]  icosapent Ethyl (VASCEPA) 1 g capsule Take 2 capsules (2 g total) by mouth 2 (two) times daily. 08/19/23  Yes Vicky Grange M, DO  levothyroxine (SYNTHROID) 50 MCG tablet Take 1/2 tablet on Mon, Wed, Fri and 1 tablet daily Tues and Thursday 02/17/23  Yes Gottschalk, Ashly M, DO  losartan (COZAAR) 100 MG tablet Take 1 tablet (100 mg total) by mouth daily. 02/17/23  Yes Gottschalk, Sharolyn Decant M, DO  omeprazole (PRILOSEC) 20 MG capsule Take 1 capsule (20 mg total) by mouth 2 (two) times daily as needed. Patient taking differently: Take 20 mg by mouth in the morning and at bedtime. 02/17/23  Yes Vicky Grange M, DO  Probiotic Product (PROBIOTIC DAILY PO) Take 1 capsule by mouth at bedtime.   Yes [provider]  traMADol (ULTRAM) 50 MG tablet Take 50 mg by mouth every 8 (eight) hours as needed (pain.).   Yes [provider]  ZINC GLUCONATE PO Take 50 mg by mouth in the morning.   Yes [provider]  carboxymethylcellulose (REFRESH PLUS) 0.5 % SOLN 1-2 drops 3 (three) times daily as needed (dry/irritated eyes.).    [provider]  diclofenac Sodium (VOLTAREN) 1 % GEL Apply 4 g topically 4 (four) times daily as needed (pain.).    [provider]    Allergies  Allergen Reactions   Codeine     Headache     Femring [Estradiol] Rash   Cephalexin Rash   Erythromycin Other (See Comments)    Gi  Upset     Penicillins Rash    Social History   Socioeconomic History   Marital status: Married    Spouse name: Barnet Lias"   Number of children: 1   Years of education: 16   Highest education level: Bachelor's degree (e.g., BA, AB, BS)  Occupational History   Occupation: Retired    Associate Professor: Tour manager    Comment: Teacher  Tobacco Use   Smoking status: Never    Smokeless tobacco: Never  Vaping Use   Vaping status: Never Used  Substance and Sexual Activity   Alcohol use: Yes    Alcohol/week: 7.0 standard drinks of alcohol    Types: 7 Glasses of wine per week    Comment: 1 daily   Drug use: No   Sexual activity: Never  Other Topics Concern   Not on file  Social History Narrative   Lives home with her husband "[name]" Their son lives in [place]. One grandchild   Social Drivers of Corporate investment banker Strain: Low Risk  (08/17/2023)   Overall Financial Resource Strain (CARDIA)    Difficulty of Paying Living Expenses: Not hard at all  Food Insecurity: No Food Insecurity (08/17/2023)   Hunger Vital Sign    Worried About Running Out of Food in the  Last Year: Never true    Ran Out of Food in the Last Year: Never true  Transportation Needs: No Transportation Needs (08/17/2023)   PRAPARE - Administrator, Civil Service (Medical): No    Lack of Transportation (Non-Medical): No  Physical Activity: Sufficiently Active (08/17/2023)   Exercise Vital Sign    Days of Exercise per Week: 6 days    Minutes of Exercise per Session: 30 min  Stress: No Stress Concern Present (08/17/2023)   [employer] of Occupational Health - Occupational Stress Questionnaire    Feeling of Stress : Not at all  Social Connections: Socially Integrated (08/17/2023)   Social Connection and Isolation Panel [NHANES]    Frequency of Communication with Friends and Family: More than three times a week    Frequency of Social Gatherings with Friends and Family: Twice a week    Attends Religious Services: More than 4 times per year    Active Member of [employer] or Organizations: Yes    Attends Engineer, structural: More than 4 times per year    Marital Status: Married  Catering manager Violence: Not At Risk (01/29/2022)   Humiliation, Afraid, Rape, and Kick questionnaire    Fear of Current or Ex-Partner: No    Emotionally Abused: No    Physically Abused: No     Sexually Abused: No    Tobacco Use: Low Risk  (11/07/2023)   Patient History    Smoking Tobacco Use: Never    Smokeless Tobacco Use: Never    Passive Exposure: Not on file   Social History   Substance and Sexual Activity  Alcohol Use Yes   Alcohol/week: 7.0 standard drinks of alcohol   Types: 7 Glasses of wine per week   Comment: 1 daily    Family History  Problem Relation Age of Onset   Diabetes Father    Heart disease Father    Heart attack Father 74   Breast cancer Mother    Breast cancer Paternal Grandmother        Aunt also    Breast cancer Other        Family History   Breast cancer Maternal Aunt    Breast cancer Paternal Aunt    Colon cancer Neg Hx    Esophageal cancer Neg Hx    Rectal cancer Neg Hx    Stomach cancer Neg Hx     ROS   Objective:  Physical Exam: - Well-developed female in no apparent distress.  - Her left hip has normal range of motion with no discomfort.  - Her right hip can be flexed about 100 degrees with minimal internal or external rotation, only about 10 degrees of abduction.  - She has an antalgic gait pattern on her right.  - Her left hemipelvis is higher than her right while standing, but she has severe scoliosis towards the right, making the hemipelvis higher.   IMAGING:  Radiographs from January 2025 from the [facility] system demonstrate severe bone-on-bone arthritis in the right hip with subchondral cystic formation and marginal osteophytes. The left hip is normal.   Assessment/Plan:  End stage arthritis, right hip  The patient history, physical examination, clinical judgement of the provider and imaging studies are consistent with end stage degenerative joint disease of the right hip and total hip arthroplasty is deemed medically necessary. The treatment options including medical management, injection therapy, arthroscopy and arthroplasty were discussed at length. The risks and benefits of total hip arthroplasty were presented  and reviewed. The risks due to aseptic loosening, infection, stiffness, dislocation/subluxation, thromboembolic complications and other imponderables were discussed. The patient acknowledged the explanation, agreed to proceed with the plan and consent was signed. Patient is being admitted for inpatient treatment for surgery, pain control, PT, OT, prophylactic antibiotics, VTE prophylaxis, progressive ambulation and ADLs and discharge planning.The patient is planning to be discharged  home .   Patient's anticipated LOS is less than 2 midnights, meeting these requirements: - Younger than 41 - Lives within 1 hour of care - Has a competent adult at home to recover with post-op recover - NO history of  - Chronic pain requiring opiods  - Diabetes  - Coronary Artery Disease  - Heart failure  - Heart attack  - Stroke  - DVT/VTE  - Cardiac arrhythmia  - Respiratory Failure/COPD  - Renal failure  - Anemia  - Advanced Liver disease  Therapy Plans: HEP Disposition: Home with husband Planned DVT Prophylaxis: Aspirin 81mg  BID  DME Needed: None PCP: Ashly Gottschalk (clearance received) TXA: IV Allergies: cephalexin (hives), codeine (not effective, dizziness), erythromycin (diarrhea, rash), estradiol (rash), femring (itching), penicillin (fever and severe rash, childhood allergy) Anesthesia Concerns: None BMI: 25.7 Last HgbA1c: Not diabetic Pharmacy: [facility]   - Patient was instructed on what medications to stop prior to surgery. - Follow-up visit in 2 weeks with Dr. France Ina - Begin physical therapy following surgery - Pre-operative lab work as pre-surgical testing - Prescriptions will be provided in hospital at time of discharge  Angelo Kennedy, PA-C Orthopedic Surgery [facility] [place]

## 2023-11-16 ENCOUNTER — Ambulatory Visit (HOSPITAL_COMMUNITY)

## 2023-11-16 ENCOUNTER — Ambulatory Visit (HOSPITAL_COMMUNITY): Payer: Self-pay | Admitting: Physician Assistant

## 2023-11-16 ENCOUNTER — Observation Stay (HOSPITAL_COMMUNITY)
Admission: RE | Admit: 2023-11-16 | Discharge: 2023-11-17 | Disposition: A | Discharge status: Home / Self Care | Source: Ambulatory Visit | Attending: Orthopedic Surgery | Admitting: Orthopedic Surgery

## 2023-11-16 ENCOUNTER — Encounter (HOSPITAL_COMMUNITY): Payer: Self-pay | Admitting: Orthopedic Surgery

## 2023-11-16 ENCOUNTER — Encounter (HOSPITAL_COMMUNITY)
Admission: RE | Disposition: A | Discharge status: Home / Self Care | Payer: Self-pay | Source: Ambulatory Visit | Attending: Orthopedic Surgery

## 2023-11-16 ENCOUNTER — Ambulatory Visit (HOSPITAL_BASED_OUTPATIENT_CLINIC_OR_DEPARTMENT_OTHER): Payer: Self-pay | Admitting: Physician Assistant

## 2023-11-16 ENCOUNTER — Other Ambulatory Visit: Payer: Self-pay

## 2023-11-16 DIAGNOSIS — I10 Essential (primary) hypertension: Principal | ICD-10-CM | POA: Insufficient documentation

## 2023-11-16 DIAGNOSIS — Z79899 Other long term (current) drug therapy: Secondary | ICD-10-CM | POA: Insufficient documentation

## 2023-11-16 DIAGNOSIS — E039 Hypothyroidism, unspecified: Secondary | ICD-10-CM

## 2023-11-16 DIAGNOSIS — M1611 Unilateral primary osteoarthritis, right hip: Secondary | ICD-10-CM

## 2023-11-16 DIAGNOSIS — Z85828 Personal history of other malignant neoplasm of skin: Secondary | ICD-10-CM | POA: Insufficient documentation

## 2023-11-16 DIAGNOSIS — M169 Osteoarthritis of hip, unspecified: Secondary | ICD-10-CM | POA: Diagnosis present

## 2023-11-16 HISTORY — PX: TOTAL HIP ARTHROPLASTY: SHX124

## 2023-11-16 LAB — POCT I-STAT, CHEM 8
BUN: 14 mg/dL (ref 8–23)
Calcium, Ion: 1.19 mmol/L (ref 1.15–1.40)
Chloride: 101 mmol/L (ref 98–111)
Creatinine, Ser: 0.5 mg/dL (ref 0.44–1.00)
Glucose, Bld: 123 mg/dL — ABNORMAL HIGH (ref 70–99)
HCT: 34 % — ABNORMAL LOW (ref 36.0–46.0)
Hemoglobin: 11.6 g/dL — ABNORMAL LOW (ref 12.0–15.0)
Potassium: 3.7 mmol/L (ref 3.5–5.1)
Sodium: 136 mmol/L (ref 135–145)
TCO2: 25 mmol/L (ref 22–32)

## 2023-11-16 LAB — TYPE AND SCREEN
ABO/RH(D): A POS
Antibody Screen: NEGATIVE

## 2023-11-16 LAB — ABO/RH: ABO/RH(D): A POS

## 2023-11-16 SURGERY — ARTHROPLASTY, HIP, TOTAL, ANTERIOR APPROACH
Anesthesia: Spinal | Site: Hip | Laterality: Right

## 2023-11-16 MED ORDER — ACETAMINOPHEN 10 MG/ML IV SOLN
1000.0000 mg | Freq: Four times a day (QID) | INTRAVENOUS | Status: DC
  Administered 2023-11-16: 1000 mg via INTRAVENOUS
  Filled 2023-11-16: qty 100

## 2023-11-16 MED ORDER — BISACODYL 10 MG RE SUPP: 10.0000 mg | Freq: Every day | RECTAL | Status: DC | PRN

## 2023-11-16 MED ORDER — PROPOFOL 10 MG/ML IV BOLUS
INTRAVENOUS | Status: DC | PRN
  Administered 2023-11-16: 20 mg via INTRAVENOUS

## 2023-11-16 MED ORDER — DROPERIDOL 2.5 MG/ML IJ SOLN: 0.6250 mg | Freq: Once | INTRAMUSCULAR | Status: DC | PRN

## 2023-11-16 MED ORDER — LACTATED RINGERS IV SOLN: INTRAVENOUS | Status: DC

## 2023-11-16 MED ORDER — ONDANSETRON HCL 4 MG/2ML IJ SOLN
INTRAMUSCULAR | Status: AC
  Filled 2023-11-16: qty 2

## 2023-11-16 MED ORDER — PHENYLEPHRINE 80 MCG/ML (10ML) SYRINGE FOR IV PUSH (FOR BLOOD PRESSURE SUPPORT)
PREFILLED_SYRINGE | INTRAVENOUS | Status: DC | PRN
  Administered 2023-11-16: 160 ug via INTRAVENOUS
  Administered 2023-11-16 (×2): 80 ug via INTRAVENOUS

## 2023-11-16 MED ORDER — POLYETHYLENE GLYCOL 3350 17 G PO PACK: 17.0000 g | PACK | Freq: Every day | ORAL | Status: DC | PRN

## 2023-11-16 MED ORDER — PROPOFOL 1000 MG/100ML IV EMUL
INTRAVENOUS | Status: AC
  Filled 2023-11-16: qty 100

## 2023-11-16 MED ORDER — ASPIRIN 81 MG PO CHEW
81.0000 mg | CHEWABLE_TABLET | Freq: Two times a day (BID) | ORAL | Status: DC
  Administered 2023-11-17: 81 mg via ORAL
  Filled 2023-11-16: qty 1

## 2023-11-16 MED ORDER — TRANEXAMIC ACID-NACL 1000-0.7 MG/100ML-% IV SOLN
1000.0000 mg | INTRAVENOUS | Status: AC
  Administered 2023-11-16: 1000 mg via INTRAVENOUS
  Filled 2023-11-16: qty 100

## 2023-11-16 MED ORDER — BUPIVACAINE IN DEXTROSE 0.75-8.25 % IT SOLN
INTRATHECAL | Status: DC | PRN
  Administered 2023-11-16: 1.6 mL via INTRATHECAL

## 2023-11-16 MED ORDER — METHOCARBAMOL 500 MG PO TABS
500.0000 mg | ORAL_TABLET | Freq: Four times a day (QID) | ORAL | Status: DC | PRN
  Administered 2023-11-17: 500 mg via ORAL
  Filled 2023-11-16: qty 1

## 2023-11-16 MED ORDER — ICOSAPENT ETHYL 1 G PO CAPS
2.0000 g | ORAL_CAPSULE | Freq: Two times a day (BID) | ORAL | Status: DC
  Administered 2023-11-17: 2 g via ORAL
  Filled 2023-11-16 (×2): qty 2

## 2023-11-16 MED ORDER — VANCOMYCIN HCL IN DEXTROSE 1-5 GM/200ML-% IV SOLN
1000.0000 mg | Freq: Two times a day (BID) | INTRAVENOUS | Status: AC
  Administered 2023-11-16: 1000 mg via INTRAVENOUS
  Filled 2023-11-16: qty 200

## 2023-11-16 MED ORDER — ACETAMINOPHEN 325 MG PO TABS: 325.0000 mg | ORAL_TABLET | Freq: Four times a day (QID) | ORAL | Status: DC | PRN

## 2023-11-16 MED ORDER — MENTHOL 3 MG MT LOZG: 1.0000 | LOZENGE | OROMUCOSAL | Status: DC | PRN

## 2023-11-16 MED ORDER — PANTOPRAZOLE SODIUM 40 MG PO TBEC
40.0000 mg | DELAYED_RELEASE_TABLET | Freq: Every day | ORAL | Status: DC
  Administered 2023-11-17: 40 mg via ORAL
  Filled 2023-11-16: qty 1

## 2023-11-16 MED ORDER — MAGNESIUM CITRATE PO SOLN: 1.0000 | Freq: Once | ORAL | Status: DC | PRN

## 2023-11-16 MED ORDER — DEXAMETHASONE SODIUM PHOSPHATE 10 MG/ML IJ SOLN
8.0000 mg | Freq: Once | INTRAMUSCULAR | Status: AC
  Administered 2023-11-16: 8 mg via INTRAVENOUS

## 2023-11-16 MED ORDER — DOCUSATE SODIUM 100 MG PO CAPS
100.0000 mg | ORAL_CAPSULE | Freq: Two times a day (BID) | ORAL | Status: DC
  Administered 2023-11-16 – 2023-11-17 (×2): 100 mg via ORAL
  Filled 2023-11-16 (×2): qty 1

## 2023-11-16 MED ORDER — ONDANSETRON HCL 4 MG/2ML IJ SOLN: 4.0000 mg | Freq: Four times a day (QID) | INTRAMUSCULAR | Status: DC | PRN

## 2023-11-16 MED ORDER — METOCLOPRAMIDE HCL 5 MG/ML IJ SOLN: 5.0000 mg | Freq: Three times a day (TID) | INTRAMUSCULAR | Status: DC | PRN

## 2023-11-16 MED ORDER — GABAPENTIN 100 MG PO CAPS
100.0000 mg | ORAL_CAPSULE | Freq: Two times a day (BID) | ORAL | Status: DC
  Administered 2023-11-16 – 2023-11-17 (×2): 100 mg via ORAL
  Filled 2023-11-16 (×2): qty 1

## 2023-11-16 MED ORDER — 0.9 % SODIUM CHLORIDE (POUR BTL) OPTIME
TOPICAL | Status: DC | PRN
  Administered 2023-11-16: 1000 mL

## 2023-11-16 MED ORDER — METOCLOPRAMIDE HCL 5 MG PO TABS: 5.0000 mg | ORAL_TABLET | Freq: Three times a day (TID) | ORAL | Status: DC | PRN

## 2023-11-16 MED ORDER — HYDROMORPHONE HCL 1 MG/ML IJ SOLN: 0.5000 mg | INTRAMUSCULAR | Status: DC | PRN

## 2023-11-16 MED ORDER — LIDOCAINE HCL (PF) 2 % IJ SOLN
INTRAMUSCULAR | Status: AC
  Filled 2023-11-16: qty 5

## 2023-11-16 MED ORDER — LOSARTAN POTASSIUM 50 MG PO TABS
100.0000 mg | ORAL_TABLET | Freq: Every day | ORAL | Status: DC
  Administered 2023-11-17: 100 mg via ORAL
  Filled 2023-11-16: qty 2

## 2023-11-16 MED ORDER — DEXAMETHASONE SODIUM PHOSPHATE 10 MG/ML IJ SOLN
10.0000 mg | Freq: Once | INTRAMUSCULAR | Status: AC
  Administered 2023-11-17: 10 mg via INTRAVENOUS
  Filled 2023-11-16: qty 1

## 2023-11-16 MED ORDER — LEVOTHYROXINE SODIUM 50 MCG PO TABS
50.0000 ug | ORAL_TABLET | ORAL | Status: DC
  Administered 2023-11-17: 50 ug via ORAL
  Filled 2023-11-16: qty 1

## 2023-11-16 MED ORDER — SODIUM CHLORIDE 0.9 % IV SOLN: INTRAVENOUS | Status: DC

## 2023-11-16 MED ORDER — ORAL CARE MOUTH RINSE: 15.0000 mL | Freq: Once | OROMUCOSAL | Status: AC

## 2023-11-16 MED ORDER — HYDROMORPHONE HCL 1 MG/ML IJ SOLN
0.2500 mg | INTRAMUSCULAR | Status: DC | PRN
Start: 2023-11-16 — End: 2023-11-16

## 2023-11-16 MED ORDER — AMLODIPINE BESYLATE 5 MG PO TABS
5.0000 mg | ORAL_TABLET | Freq: Every day | ORAL | Status: DC
  Administered 2023-11-17: 5 mg via ORAL
  Filled 2023-11-16: qty 1

## 2023-11-16 MED ORDER — LEVOTHYROXINE SODIUM 25 MCG PO TABS
25.0000 ug | ORAL_TABLET | ORAL | Status: DC
Start: 2023-11-18 — End: 2023-11-17

## 2023-11-16 MED ORDER — VANCOMYCIN HCL IN DEXTROSE 1-5 GM/200ML-% IV SOLN
1000.0000 mg | INTRAVENOUS | Status: AC
  Administered 2023-11-16: 1000 mg via INTRAVENOUS
  Filled 2023-11-16: qty 200

## 2023-11-16 MED ORDER — HYDROMORPHONE HCL 2 MG PO TABS
1.0000 mg | ORAL_TABLET | ORAL | Status: DC | PRN
  Administered 2023-11-16 – 2023-11-17 (×5): 2 mg via ORAL
  Filled 2023-11-16 (×5): qty 1

## 2023-11-16 MED ORDER — CHLORHEXIDINE GLUCONATE 0.12 % MT SOLN
15.0000 mL | Freq: Once | OROMUCOSAL | Status: AC
  Administered 2023-11-16: 15 mL via OROMUCOSAL

## 2023-11-16 MED ORDER — DEXAMETHASONE SODIUM PHOSPHATE 10 MG/ML IJ SOLN
INTRAMUSCULAR | Status: AC
  Filled 2023-11-16: qty 1

## 2023-11-16 MED ORDER — BUPIVACAINE-EPINEPHRINE (PF) 0.25% -1:200000 IJ SOLN
INTRAMUSCULAR | Status: DC | PRN
  Administered 2023-11-16: 30 mL via PERINEURAL

## 2023-11-16 MED ORDER — TRAMADOL HCL 50 MG PO TABS
50.0000 mg | ORAL_TABLET | Freq: Four times a day (QID) | ORAL | Status: DC | PRN
  Administered 2023-11-16 – 2023-11-17 (×2): 100 mg via ORAL
  Filled 2023-11-16 (×2): qty 2

## 2023-11-16 MED ORDER — PROPOFOL 500 MG/50ML IV EMUL
INTRAVENOUS | Status: DC | PRN
  Administered 2023-11-16: 120 ug/kg/min via INTRAVENOUS

## 2023-11-16 MED ORDER — ONDANSETRON HCL 4 MG/2ML IJ SOLN
INTRAMUSCULAR | Status: DC | PRN
  Administered 2023-11-16: 4 mg via INTRAVENOUS

## 2023-11-16 MED ORDER — PHENOL 1.4 % MT LIQD: 1.0000 | OROMUCOSAL | Status: DC | PRN

## 2023-11-16 MED ORDER — POVIDONE-IODINE 10 % EX SWAB
2.0000 | Freq: Once | CUTANEOUS | Status: AC
  Administered 2023-11-16: 2 via TOPICAL

## 2023-11-16 MED ORDER — BUPIVACAINE-EPINEPHRINE (PF) 0.25% -1:200000 IJ SOLN
INTRAMUSCULAR | Status: AC
  Filled 2023-11-16: qty 30

## 2023-11-16 MED ORDER — ONDANSETRON HCL 4 MG PO TABS: 4.0000 mg | ORAL_TABLET | Freq: Four times a day (QID) | ORAL | Status: DC | PRN

## 2023-11-16 MED ORDER — METHOCARBAMOL 1000 MG/10ML IJ SOLN: 500.0000 mg | Freq: Four times a day (QID) | INTRAMUSCULAR | Status: DC | PRN

## 2023-11-16 MED ORDER — EZETIMIBE 10 MG PO TABS: 10.0000 mg | ORAL_TABLET | Freq: Every evening | ORAL | Status: DC

## 2023-11-16 MED ORDER — ATORVASTATIN CALCIUM 20 MG PO TABS: 20.0000 mg | ORAL_TABLET | Freq: Every evening | ORAL | Status: DC

## 2023-11-16 SURGICAL SUPPLY — 35 items
BAG ZIPLOCK 12X15 (MISCELLANEOUS) IMPLANT
BLADE SAG 18X100X1.27 (BLADE) ×2 IMPLANT
COVER PERINEAL POST (MISCELLANEOUS) ×2 IMPLANT
COVER SURGICAL LIGHT HANDLE (MISCELLANEOUS) ×2 IMPLANT
CUP ACETBLR 48 OD SECTOR II (Hips) IMPLANT
DERMABOND ADVANCED .7 DNX12 (GAUZE/BANDAGES/DRESSINGS) ×2 IMPLANT
DRAPE FOOT SWITCH (DRAPES) ×2 IMPLANT
DRAPE STERI IOBAN 125X83 (DRAPES) ×2 IMPLANT
DRAPE U-SHAPE 47X51 STRL (DRAPES) ×4 IMPLANT
DRSG AQUACEL AG ADV 3.5X10 (GAUZE/BANDAGES/DRESSINGS) ×2 IMPLANT
DURAPREP 26ML APPLICATOR (WOUND CARE) ×2 IMPLANT
ELECT REM PT RETURN 15FT ADLT (MISCELLANEOUS) ×2 IMPLANT
GLOVE BIO SURGEON STRL SZ7 (GLOVE) IMPLANT
GLOVE BIO SURGEON STRL SZ8 (GLOVE) ×2 IMPLANT
GLOVE BIOGEL PI IND STRL 7.0 (GLOVE) IMPLANT
GLOVE BIOGEL PI IND STRL 8 (GLOVE) ×2 IMPLANT
GOWN STRL REUS W/ TWL LRG LVL3 (GOWN DISPOSABLE) ×4 IMPLANT
HEAD FEM STD 28X+1.5 STRL (Hips) IMPLANT
HOLDER FOLEY CATH W/STRAP (MISCELLANEOUS) ×2 IMPLANT
KIT TURNOVER KIT A (KITS) IMPLANT
LINER MARATHON 28 48 (Hips) IMPLANT
MANIFOLD NEPTUNE II (INSTRUMENTS) ×2 IMPLANT
PACK ANTERIOR HIP CUSTOM (KITS) ×2 IMPLANT
PENCIL SMOKE EVACUATOR COATED (MISCELLANEOUS) ×2 IMPLANT
SPIKE FLUID TRANSFER (MISCELLANEOUS) ×2 IMPLANT
STEM FEM ACTIS STD SZ1 (Stem) IMPLANT
SUT ETHIBOND NAB CT1 #1 30IN (SUTURE) ×2 IMPLANT
SUT MNCRL AB 4-0 PS2 18 (SUTURE) ×2 IMPLANT
SUT STRATAFIX 0 PDS 27 VIOLET (SUTURE) ×1 IMPLANT
SUT VIC AB 2-0 CT1 TAPERPNT 27 (SUTURE) ×4 IMPLANT
SUTURE STRATFX 0 PDS 27 VIOLET (SUTURE) ×2 IMPLANT
TOWEL GREEN STERILE FF (TOWEL DISPOSABLE) ×2 IMPLANT
TRAY FOLEY MTR SLVR 14FR STAT (SET/KITS/TRAYS/PACK) IMPLANT
TRAY FOLEY MTR SLVR 16FR STAT (SET/KITS/TRAYS/PACK) ×2 IMPLANT
TUBE SUCTION HIGH CAP CLEAR NV (SUCTIONS) ×2 IMPLANT

## 2023-11-16 NOTE — Evaluation (Signed)
 Physical Therapy Evaluation Patient Details Name: Marissa Perez MRN: [id number] DOB: 11/05/1945 Today's Date: 11/16/2023  History of Present Illness  78 yo female s/p R DA THA on 11/16/23. PMH: HLD, HTN, interstitial cystitis, IBS  Clinical Impression  Pt is s/p THA resulting in the deficits listed below (see PT Problem List).  Pt amb 30' with RW and CGA; pt motivated, anticipate steady progress in acute setting.   Pt will benefit from acute skilled PT to increase their independence and safety with mobility to facilitate discharge.          If plan is discharge home, recommend the following: Assistance with cooking/housework;Help with stairs or ramp for entrance;A little help with bathing/dressing/bathroom   Can travel by private vehicle        Equipment Recommendations None recommended by PT  Recommendations for Other Services       Functional Status Assessment Patient has had a recent decline in their functional status and demonstrates the ability to make significant improvements in function in a reasonable and predictable amount of time.     Precautions / Restrictions Precautions Precautions: Fall Restrictions Weight Bearing Restrictions Per Provider Order: No Other Position/Activity Restrictions: WBAT      Mobility  Bed Mobility Overal bed mobility: Needs Assistance Bed Mobility: Supine to Sit     Supine to sit: Contact guard     General bed mobility comments: for safety    Transfers Overall transfer level: Needs assistance Equipment used: Rolling walker (2 wheels) Transfers: Sit to/from Stand Sit to Stand: Contact guard assist, Min assist           General transfer comment: cues for hand placement and RLE position    Ambulation/Gait Ambulation/Gait assistance: Contact guard assist Gait Distance (Feet): 30 Feet Assistive device: Rolling walker (2 wheels) Gait Pattern/deviations: Step-to pattern       General Gait Details: cues for RW position  and sequence  Stairs            Wheelchair Mobility     Tilt Bed    Modified Rankin (Stroke Patients Only)       Balance                                             Pertinent Vitals/Pain Pain Assessment Pain Assessment: 0-10 Pain Location: right hip Pain Descriptors / Indicators: Grimacing, Sore, Aching Pain Intervention(s): Limited activity within patient's tolerance, Monitored during session, Premedicated before session    Home Living Family/patient expects to be discharged to:: Private residence Living Arrangements: Spouse/significant other Available Help at Discharge: Family;Available 24 hours/day Type of Home: House Home Access: Stairs to enter Entrance Stairs-Rails: Right;Left Entrance Stairs-Number of Steps: 2 Alternate Level Stairs-Number of Steps: 2 Home Layout: Multi-level Home Equipment: Agricultural consultant (2 wheels);Rollator (4 wheels);Cane - single point;Grab bars - toilet;Shower seat - built in;Grab bars - tub/shower      Prior Function Prior Level of Function : Independent/Modified Independent             Mobility Comments: amb with RW ADLs Comments: ind     Extremity/Trunk Assessment   Upper Extremity Assessment Upper Extremity Assessment: Overall WFL for tasks assessed (reports "bad R arm")    Lower Extremity Assessment Lower Extremity Assessment: (P) RLE deficits/detail RLE Deficits / Details: ankle WFL, A/AAROM hip and knee grossly WFL, strength grossly 3/5  Communication   Communication Communication: No apparent difficulties    Cognition Arousal: Alert Behavior During Therapy: WFL for tasks assessed/performed   PT - Cognitive impairments: No apparent impairments                                 Cueing Cueing Techniques: Verbal cues     General Comments      Exercises Total Joint Exercises Ankle Circles/Pumps: AROM, Both, 10 reps   Assessment/Plan    PT Assessment Patient needs  continued PT services  PT Problem List Decreased strength;Decreased range of motion;Decreased activity tolerance;Decreased balance;Decreased mobility;Pain;Decreased knowledge of use of DME       PT Treatment Interventions DME instruction;Gait training;Functional mobility training;Stair training;Therapeutic activities;Therapeutic exercise;Patient/family education    PT Goals (Current goals can be found in the Care Plan section)  Acute Rehab PT Goals Patient Stated Goal: less hip pain PT Goal Formulation: With patient Time For Goal Achievement: 11/23/23 Potential to Achieve Goals: Good    Frequency 7X/week     Co-evaluation               AM-PAC PT "6 Clicks" Mobility  Outcome Measure Help needed turning from your back to your side while in a flat bed without using bedrails?: A Little Help needed moving from lying on your back to sitting on the side of a flat bed without using bedrails?: A Little Help needed moving to and from a bed to a chair (including a wheelchair)?: A Little Help needed standing up from a chair using your arms (e.g., wheelchair or bedside chair)?: A Little Help needed to walk in hospital room?: A Little Help needed climbing 3-5 steps with a railing? : A Lot 6 Click Score: 17    End of Session Equipment Utilized During Treatment: Gait belt Activity Tolerance: Patient tolerated treatment well Patient left: in chair;with chair alarm set;with family/visitor present Nurse Communication: Mobility status PT Visit Diagnosis: Other abnormalities of gait and mobility (R26.89);History of falling (Z91.81)    Time: 9147-8295 PT Time Calculation (min) (ACUTE ONLY): 17 min   Charges:   PT Evaluation $PT Eval Low Complexity: 1 Low   PT General Charges $$ ACUTE PT VISIT: 1 Visit         Tamitha Norell, PT  Acute Rehab Dept [facility]) [phone]  11/16/2023   [facility] 11/16/2023, 6:00 PM

## 2023-11-16 NOTE — Anesthesia Postprocedure Evaluation (Signed)
 Anesthesia Post Note  Patient: Marissa Perez  Procedure(s) Performed: ARTHROPLASTY, HIP, TOTAL, ANTERIOR APPROACH (Right: Hip)     Patient location during evaluation: PACU Anesthesia Type: Spinal Level of consciousness: awake and alert Pain management: pain level controlled Vital Signs Assessment: post-procedure vital signs reviewed and stable Respiratory status: spontaneous breathing Cardiovascular status: stable Anesthetic complications: no   No notable events documented.  Last Vitals:  Vitals:   11/16/23 1459 11/16/23 1725  BP: (!) 155/65 (!) 167/70  Pulse: 85 (!) 104  Resp: 12 18  Temp: 36.7 C 36.6 C  SpO2: 95% 100%    Last Pain:  Vitals:   11/16/23 1611  TempSrc:   PainSc: 2                  Gorman Laughter

## 2023-11-16 NOTE — Discharge Instructions (Signed)
Marissa Aluisio, MD Total Joint Specialist [facility] [address] [place], [place] [address] [phone]  ANTERIOR APPROACH TOTAL HIP REPLACEMENT POSTOPERATIVE DIRECTIONS     Hip Rehabilitation, Guidelines Following Surgery  The results of a hip operation are greatly improved after range of motion and muscle strengthening exercises. Follow all safety measures which are given to protect your hip. If any of these exercises cause increased pain or swelling in your joint, decrease the amount until you are comfortable again. Then slowly increase the exercises. Call your caregiver if you have problems or questions.   BLOOD CLOT PREVENTION Take an 81 mg Aspirin two times a day for three weeks following surgery. Then take an 81 mg Aspirin once a day for three weeks. Then discontinue Aspirin. You may resume your vitamins/supplements upon discharge from the hospital. Do not take any NSAIDs (Advil, Aleve, Ibuprofen, Meloxicam, etc.) until you are 3 weeks out from surgery  HOME CARE INSTRUCTIONS  Remove items at home which could result in a fall. This includes throw rugs or furniture in walking pathways.  ICE to the affected hip as frequently as 20-30 minutes an hour and then as needed for pain and swelling. Continue to use ice on the hip for pain and swelling from surgery. You may notice swelling that will progress down to the foot and ankle. This is normal after surgery. Elevate the leg when you are not up walking on it.   Continue to use the breathing machine which will help keep your temperature down.  It is common for your temperature to cycle up and down following surgery, especially at night when you are not up moving around and exerting yourself.  The breathing machine keeps your lungs expanded and your temperature down.  DIET You may resume your previous home diet once your are discharged from the hospital.  DRESSING / WOUND CARE / SHOWERING You have an  adhesive waterproof bandage over the incision. Leave this in place until your first follow-up appointment. Once you remove this you will not need to place another bandage.  You may begin showering 3 days following surgery, but do not submerge the incision under water.  ACTIVITY For the first 3-5 days, it is important to rest and keep the operative leg elevated. You should, as a general rule, rest for 50 minutes and walk/stretch for 10 minutes per hour. After 5 days, you may slowly increase activity as tolerated.  Perform the exercises you were provided twice a day for about 15-20 minutes each session. Begin these 2 days following surgery. Walk with your walker as instructed. Use the walker until you are comfortable transitioning to a cane. Walk with the cane in the opposite hand of the operative leg. You may discontinue the cane once you are comfortable and walking steadily. Avoid periods of inactivity such as sitting longer than an hour when not asleep. This helps prevent blood clots.  Do not drive a car for 6 weeks or until released by your surgeon.  Do not drive while taking narcotics.  TED HOSE STOCKINGS Wear the elastic stockings on both legs for three weeks following surgery during the day. You may remove them at night while sleeping.  WEIGHT BEARING Weight bearing as tolerated with assist device (walker, cane, etc) as directed, use it as long as suggested by your surgeon or therapist, typically at least 4-6 weeks.  POSTOPERATIVE CONSTIPATION PROTOCOL Constipation - defined medically as fewer than three stools per week and severe constipation as   less than one stool per week.  One of the most common issues patients have following surgery is constipation.  Even if you have a regular bowel pattern at home, your normal regimen is likely to be disrupted due to multiple reasons following surgery.  Combination of anesthesia, postoperative narcotics, change in appetite and fluid intake all can  affect your bowels.  In order to avoid complications following surgery, here are some recommendations in order to help you during your recovery period.  Colace (docusate) - Pick up an over-the-counter form of Colace or another stool softener and take twice a day as long as you are requiring postoperative pain medications.  Take with a full glass of water daily.  If you experience loose stools or diarrhea, hold the colace until you stool forms back up.  If your symptoms do not get better within 1 week or if they get worse, check with your doctor. Dulcolax (bisacodyl) - Pick up over-the-counter and take as directed by the product packaging as needed to assist with the movement of your bowels.  Take with a full glass of water.  Use this product as needed if not relieved by Colace only.  MiraLax (polyethylene glycol) - Pick up over-the-counter to have on hand.  MiraLax is a solution that will increase the amount of water in your bowels to assist with bowel movements.  Take as directed and can mix with a glass of water, juice, soda, coffee, or tea.  Take if you go more than two days without a movement.Do not use MiraLax more than once per day. Call your doctor if you are still constipated or irregular after using this medication for 7 days in a row.  If you continue to have problems with postoperative constipation, please contact the office for further assistance and recommendations.  If you experience "the worst abdominal pain ever" or develop nausea or vomiting, please contact the office immediatly for further recommendations for treatment.  ITCHING  If you experience itching with your medications, try taking only a single pain pill, or even half a pain pill at a time.  You can also use Benadryl over the counter for itching or also to help with sleep.   MEDICATIONS See your medication summary on the "After Visit Summary" that the nursing staff will review with you prior to discharge.  You may have some home  medications which will be placed on hold until you complete the course of blood thinner medication.  It is important for you to complete the blood thinner medication as prescribed by your surgeon.  Continue your approved medications as instructed at time of discharge.  PRECAUTIONS If you experience chest pain or shortness of breath - call 911 immediately for transfer to the hospital emergency department.  If you develop a fever greater that 101 F, purulent drainage from wound, increased redness or drainage from wound, foul odor from the wound/dressing, or calf pain - CONTACT YOUR SURGEON.                                                   FOLLOW-UP APPOINTMENTS Make sure you keep all of your appointments after your operation with your surgeon and caregivers. You should call the office at the above phone number and make an appointment for approximately two weeks after the date of your surgery or on the   date instructed by your surgeon outlined in the "After Visit Summary".  RANGE OF MOTION AND STRENGTHENING EXERCISES  These exercises are designed to help you keep full movement of your hip joint. Follow your caregiver's or physical therapist's instructions. Perform all exercises about fifteen times, three times per day or as directed. Exercise both hips, even if you have had only one joint replacement. These exercises can be done on a training (exercise) mat, on the floor, on a table or on a bed. Use whatever works the best and is most comfortable for you. Use music or television while you are exercising so that the exercises are a pleasant break in your day. This will make your life better with the exercises acting as a break in routine you can look forward to.  Lying on your back, slowly slide your foot toward your buttocks, raising your knee up off the floor. Then slowly slide your foot back down until your leg is straight again.  Lying on your back spread your legs as far apart as you can without causing  discomfort.  Lying on your side, raise your upper leg and foot straight up from the floor as far as is comfortable. Slowly lower the leg and repeat.  Lying on your back, tighten up the muscle in the front of your thigh (quadriceps muscles). You can do this by keeping your leg straight and trying to raise your heel off the floor. This helps strengthen the largest muscle supporting your knee.  Lying on your back, tighten up the muscles of your buttocks both with the legs straight and with the knee bent at a comfortable angle while keeping your heel on the floor.   POST-OPERATIVE OPIOID TAPER INSTRUCTIONS: It is important to wean off of your opioid medication as soon as possible. If you do not need pain medication after your surgery it is ok to stop day one. Opioids include: Codeine, Hydrocodone(Norco, Vicodin), Oxycodone(Percocet, oxycontin) and hydromorphone amongst others.  Long term and even short term use of opiods can cause: Increased pain response Dependence Constipation Depression Respiratory depression And more.  Withdrawal symptoms can include Flu like symptoms Nausea, vomiting And more Techniques to manage these symptoms Hydrate well Eat regular healthy meals Stay active Use relaxation techniques(deep breathing, meditating, yoga) Do Not substitute Alcohol to help with tapering If you have been on opioids for less than two weeks and do not have pain than it is ok to stop all together.  Plan to wean off of opioids This plan should start within one week post op of your joint replacement. Maintain the same interval or time between taking each dose and first decrease the dose.  Cut the total daily intake of opioids by one tablet each day Next start to increase the time between doses. The last dose that should be eliminated is the evening dose.   IF YOU ARE TRANSFERRED TO A SKILLED REHAB FACILITY If the patient is transferred to a skilled rehab facility following release from the  hospital, a list of the current medications will be sent to the facility for the patient to continue.  When discharged from the skilled rehab facility, please have the facility set up the patient's Home Health Physical Therapy prior to being released. Also, the skilled facility will be responsible for providing the patient with their medications at time of release from the facility to include their pain medication, the muscle relaxants, and their blood thinner medication. If the patient is still at the rehab facility   at time of the two week follow up appointment, the skilled rehab facility will also need to assist the patient in arranging follow up appointment in our office and any transportation needs.  MAKE SURE YOU:  Understand these instructions.  Get help right away if you are not doing well or get worse.    DENTAL ANTIBIOTICS:  In most cases prophylactic antibiotics for Dental procdeures after total joint surgery are not necessary.  Exceptions are as follows:  1. History of prior total joint infection  2. Severely immunocompromised (Organ Transplant, cancer chemotherapy, Rheumatoid biologic meds such as Humera)  3. Poorly controlled diabetes (A1C &gt; 8.0, blood glucose over 200)  If you have one of these conditions, contact your surgeon for an antibiotic prescription, prior to your dental procedure.    Pick up stool softner and laxative for home use following surgery while on pain medications. Do not submerge incision under water. Please use good hand washing techniques while changing dressing each day. May shower starting three days after surgery. Please use a clean towel to pat the incision dry following showers. Continue to use ice for pain and swelling after surgery. Do not use any lotions or creams on the incision until instructed by your surgeon.  

## 2023-11-16 NOTE — Care Plan (Signed)
 Ortho Bundle Case Management Note  Patient Details  Name: Marissa Perez MRN: [id number] Date of Birth: [birth year]  RT THA on 11/16/23  DCP: Home with husband DME: No needs, has RW PT: HEP                   DME Arranged:  N/A DME Agency:  NA  HH Arranged:    HH Agency:     Additional Comments: Please contact me with any questions of if this plan should need to change.  Kathlene Paradise, Case Manager [facility] [phone] ext [phone]   11/16/2023, 10:13 AM

## 2023-11-16 NOTE — Anesthesia Procedure Notes (Signed)
 Spinal  Patient location during procedure: OR Start time: 11/16/2023 10:51 AM End time: 11/16/2023 10:57 AM Reason for block: surgical anesthesia Staffing Performed: anesthesiologist  Anesthesiologist: Gorman Laughter, MD Performed by: Gorman Laughter, MD Authorized by: Gorman Laughter, MD   Preanesthetic Checklist Completed: patient identified, IV checked, site marked, risks and benefits discussed, surgical consent, monitors and equipment checked, pre-op evaluation and timeout performed Spinal Block Patient position: sitting Prep: DuraPrep and site prepped and draped Patient monitoring: heart rate, continuous pulse ox and blood pressure Approach: left paramedian Location: L3-4 Injection technique: single-shot Needle Needle type: Spinocan  Needle gauge: 25 G Needle length: 9 cm Additional Notes Expiration date of kit checked and confirmed. Patient tolerated procedure well, without complications.

## 2023-11-16 NOTE — Interval H&P Note (Signed)
 History and Physical Interval Note:  11/16/2023 8:40 AM  Marissa Perez  has presented today for surgery, with the diagnosis of Right Hip Osteoarthritis.  The various methods of treatment have been discussed with the patient and family. After consideration of risks, benefits and other options for treatment, the patient has consented to  Procedure(s): ARTHROPLASTY, HIP, TOTAL, ANTERIOR APPROACH (Right) as a surgical intervention.  The patient's history has been reviewed, patient examined, no change in status, stable for surgery.  I have reviewed the patient's chart and labs.  Questions were answered to the patient's satisfaction.     Samuel Crock Sansa Alkema

## 2023-11-16 NOTE — Anesthesia Procedure Notes (Signed)
 Procedure Name: MAC Date/Time: 11/16/2023 10:55 AM  Performed by: Ulanda Gambles, CRNAPre-anesthesia Checklist: Patient identified, Emergency Drugs available, Suction available and Patient being monitored Oxygen Delivery Method: Simple face mask

## 2023-11-16 NOTE — Op Note (Signed)
 OPERATIVE REPORT- TOTAL HIP ARTHROPLASTY   PREOPERATIVE DIAGNOSIS: Osteoarthritis of the Right hip.   POSTOPERATIVE DIAGNOSIS: Osteoarthritis of the Right  hip.   PROCEDURE: Right total hip arthroplasty, anterior approach.   SURGEON: Ollen Gross, MD   ASSISTANT: Arcola Jansky, PA-C  ANESTHESIA:  Spinal  ESTIMATED BLOOD LOSS:-100 mL    DRAINS: None  COMPLICATIONS: None   CONDITION: PACU - hemodynamically stable.   BRIEF CLINICAL NOTE: Marissa Perez is a 78 y.o. female who has advanced end-  stage arthritis of their Right  hip with progressively worsening pain and  dysfunction.The patient has failed nonoperative management and presents for  total hip arthroplasty.   PROCEDURE IN DETAIL: After successful administration of spinal  anesthetic, the traction boots for the [facility] bed were placed on both  feet and the patient was placed onto the [facility] bed, boots placed into the leg  holders. The Right hip was then isolated from the perineum with plastic  drapes and prepped and draped in the usual sterile fashion. ASIS and  greater trochanter were marked and a oblique incision was made, starting  at about 1 cm lateral and 2 cm distal to the ASIS and coursing towards  the anterior cortex of the femur. The skin was cut with a 10 blade  through subcutaneous tissue to the level of the fascia overlying the  tensor fascia lata muscle. The fascia was then incised in line with the  incision at the junction of the anterior third and posterior 2/3rd. The  muscle was teased off the fascia and then the interval between the TFL  and the rectus was developed. The Hohmann retractor was then placed at  the top of the femoral neck over the capsule. The vessels overlying the  capsule were cauterized and the fat on top of the capsule was removed.  A Hohmann retractor was then placed anterior underneath the rectus  femoris to give exposure to the entire anterior capsule. A T-shaped   capsulotomy was performed. The edges were tagged and the femoral head  was identified.       Osteophytes are removed off the superior acetabulum.  The femoral neck was then cut in situ with an oscillating saw. Traction  was then applied to the left lower extremity utilizing the [facility]  traction. The femoral head was then removed. Retractors were placed  around the acetabulum and then circumferential removal of the labrum was  performed. Osteophytes were also removed. Reaming starts at 45 mm to  medialize and  Increased in 2 mm increments to 47 mm. We reamed in  approximately 40 degrees of abduction, 20 degrees anteversion. A 48 mm  pinnacle acetabular shell was then impacted in anatomic position under  fluoroscopic guidance with excellent purchase. We did not need to place  any additional dome screws. A 28 mm neutral + 4 marathon liner was then  placed into the acetabular shell.       The femoral lift was then placed along the lateral aspect of the femur  just distal to the vastus ridge. The leg was  externally rotated and capsule  was stripped off the inferior aspect of the femoral neck down to the  level of the lesser trochanter, this was done with electrocautery. The femur was lifted after this was performed. The  leg was then placed in an extended and adducted position essentially delivering the femur. We also removed the capsule superiorly and the piriformis from the piriformis fossa to  gain excellent exposure of the  proximal femur. Rongeur was used to remove some cancellous bone to get  into the lateral portion of the proximal femur for placement of the  initial starter reamer. The starter broaches was placed  the starter broach  and was shown to go down the center of the canal. Broaching  with the Actis system was then performed starting at size 0  coursing  Up to size 1. A size 1 had excellent torsional and rotational  and axial stability. The trial standard offset neck was then  placed  with a 28 + 1.5 trial head. The hip was then reduced. We confirmed that  the stem was in the canal both on AP and lateral x-rays. It also has excellent sizing. The hip was reduced with outstanding stability through full extension and full external rotation.. AP pelvis was taken and the leg lengths were measured and found to be equal. Hip was then dislocated again and the femoral head and neck removed. The  femoral broach was removed. Size 1 Actis stem with a standard offset  neck was then impacted into the femur following native anteversion. Has  excellent purchase in the canal. Excellent torsional and rotational and  axial stability. It is confirmed to be in the canal on AP and lateral  fluoroscopic views. The 28 + 1.5 metal head was placed and the hip  reduced with outstanding stability. Again AP pelvis was taken and it  confirmed that the leg lengths were equal. The wound was then copiously  irrigated with saline solution and the capsule reattached and repaired  with Ethibond suture. 30 ml of .25% Bupivicaine was  injected into the capsule and into the edge of the tensor fascia lata as well as subcutaneous tissue. The fascia overlying the tensor fascia lata was then closed with a running #1 V-Loc. Subcu was closed with interrupted 2-0 Vicryl and subcuticular running 4-0 Monocryl. Incision was cleaned  and dried. Steri-Strips and a bulky sterile dressing applied. The patient was awakened and transported to  recovery in stable condition.        Please note that a surgical assistant was a medical necessity for this procedure to perform it in a safe and expeditious manner. Assistant was necessary to provide appropriate retraction of vital neurovascular structures and to prevent femoral fracture and allow for anatomic placement of the prosthesis.  Liliane Rei, M.D.

## 2023-11-16 NOTE — Plan of Care (Signed)
   Problem: Education: Goal: Knowledge of General Education information will improve Description: Including pain rating scale, medication(s)/side effects and non-pharmacologic comfort measures Outcome: Progressing   Problem: Activity: Goal: Risk for activity intolerance will decrease Outcome: Progressing   Problem: Pain Managment: Goal: General experience of comfort will improve and/or be controlled Outcome: Progressing

## 2023-11-16 NOTE — Transfer of Care (Signed)
 Immediate Anesthesia Transfer of Care Note  Patient: Marissa Perez  Procedure(s) Performed: ARTHROPLASTY, HIP, TOTAL, ANTERIOR APPROACH (Right: Hip)  Patient Location: PACU  Anesthesia Type:MAC and Spinal  Level of Consciousness: drowsy  Airway & Oxygen Therapy: Patient Spontanous Breathing and Patient connected to face mask oxygen  Post-op Assessment: Report given to RN and Post -op Vital signs reviewed and stable  Post vital signs: Reviewed and stable  Last Vitals:  Vitals Value Taken Time  BP 108/72 11/16/23 1231  Temp    Pulse 57 11/16/23 1231  Resp 17 11/16/23 1231  SpO2 100 % 11/16/23 1231    Last Pain:  Vitals:   11/16/23 0930  TempSrc:   PainSc: 0-No pain         Complications: No notable events documented.

## 2023-11-17 ENCOUNTER — Other Ambulatory Visit (HOSPITAL_COMMUNITY): Payer: Self-pay

## 2023-11-17 ENCOUNTER — Encounter (HOSPITAL_COMMUNITY): Payer: Self-pay | Admitting: Orthopedic Surgery

## 2023-11-17 DIAGNOSIS — E039 Hypothyroidism, unspecified: Secondary | ICD-10-CM | POA: Diagnosis not present

## 2023-11-17 DIAGNOSIS — M1611 Unilateral primary osteoarthritis, right hip: Secondary | ICD-10-CM | POA: Diagnosis not present

## 2023-11-17 DIAGNOSIS — I10 Essential (primary) hypertension: Secondary | ICD-10-CM | POA: Diagnosis not present

## 2023-11-17 DIAGNOSIS — Z85828 Personal history of other malignant neoplasm of skin: Secondary | ICD-10-CM | POA: Diagnosis not present

## 2023-11-17 DIAGNOSIS — Z79899 Other long term (current) drug therapy: Secondary | ICD-10-CM | POA: Diagnosis not present

## 2023-11-17 LAB — BASIC METABOLIC PANEL WITH GFR
Anion gap: 12 (ref 5–15)
BUN: 13 mg/dL (ref 8–23)
CO2: 20 mmol/L — ABNORMAL LOW (ref 22–32)
Calcium: 8.7 mg/dL — ABNORMAL LOW (ref 8.9–10.3)
Chloride: 101 mmol/L (ref 98–111)
Creatinine, Ser: 0.31 mg/dL — ABNORMAL LOW (ref 0.44–1.00)
GFR, Estimated: >60 mL/min (ref >60)
Glucose, Bld: 110 mg/dL — ABNORMAL HIGH (ref 70–99)
Potassium: 3.8 mmol/L (ref 3.5–5.1)
Sodium: 133 mmol/L — ABNORMAL LOW (ref 135–145)

## 2023-11-17 LAB — CBC
HCT: 32.8 % — ABNORMAL LOW (ref 36.0–46.0)
Hemoglobin: 11 g/dL — ABNORMAL LOW (ref 12.0–15.0)
MCH: 31.6 pg (ref 26.0–34.0)
MCHC: 33.5 g/dL (ref 30.0–36.0)
MCV: 94.3 fL (ref 80.0–100.0)
Platelets: 361 10*3/uL (ref 150–400)
RBC: 3.48 MIL/uL — ABNORMAL LOW (ref 3.87–5.11)
RDW: 13.4 % (ref 11.5–15.5)
WBC: 13.5 10*3/uL — ABNORMAL HIGH (ref 4.0–10.5)
nRBC: 0 % (ref 0.0–0.2)

## 2023-11-17 MED ORDER — ASPIRIN 81 MG PO CHEW: 81.0000 mg | CHEWABLE_TABLET | Freq: Two times a day (BID) | ORAL | 0 refills | Status: AC

## 2023-11-17 MED ORDER — METHOCARBAMOL 500 MG PO TABS: 500.0000 mg | ORAL_TABLET | Freq: Four times a day (QID) | ORAL | 0 refills | Status: DC | PRN

## 2023-11-17 MED ORDER — TRAMADOL HCL 50 MG PO TABS
50.0000 mg | ORAL_TABLET | Freq: Four times a day (QID) | ORAL | 0 refills | Status: DC | PRN
Start: 2023-11-17 — End: 2024-04-10

## 2023-11-17 MED ORDER — HYDROMORPHONE HCL 2 MG PO TABS
1.0000 mg | ORAL_TABLET | Freq: Four times a day (QID) | ORAL | 0 refills | Status: DC | PRN
  Filled 2023-11-17: qty 21, 6d supply, fill #0

## 2023-11-17 MED ORDER — HYDROMORPHONE HCL 2 MG PO TABS
1.0000 mg | ORAL_TABLET | Freq: Four times a day (QID) | ORAL | 0 refills | Status: DC | PRN
Start: 2023-11-17 — End: 2023-11-17

## 2023-11-17 MED ORDER — ONDANSETRON HCL 4 MG PO TABS: 4.0000 mg | ORAL_TABLET | Freq: Four times a day (QID) | ORAL | 0 refills | Status: DC | PRN

## 2023-11-17 NOTE — TOC Transition Note (Signed)
 Transition of Care [facility]) - Discharge Note   Patient Details  Name: Marissa Perez MRN: [id number] Date of Birth: 14-Jul-1946  Transition of Care [facility] CM/SW Contact:  Bari Leys, RN Phone Number: 11/17/2023, 10:49 AM   Clinical Narrative:   Met with patient at bedside to review dc therapy and home equipment needs, pt confirmed HEP, has RW, no home equipment needs. No TOC needs.     Final next level of care: Home/Self Care Barriers to Discharge: No Barriers Identified   Patient Goals and CMS Choice Patient states their goals for this hospitalization and ongoing recovery are:: return home          Discharge Placement                       Discharge Plan and Services Additional resources added to the After Visit Summary for                  DME Arranged: N/A DME Agency: NA                  Social Drivers of Health (SDOH) Interventions SDOH Screenings   Food Insecurity: Patient Declined (11/16/2023)  Housing: Patient Declined (11/16/2023)  Transportation Needs: Patient Declined (11/16/2023)  Utilities: Patient Declined (11/16/2023)  Alcohol Screen: Low Risk  (08/17/2023)  Depression (PHQ2-9): Low Risk  (09/26/2023)  Financial Resource Strain: Low Risk  (08/17/2023)  Physical Activity: Sufficiently Active (08/17/2023)  Social Connections: Patient Declined (11/16/2023)  Stress: No Stress Concern Present (08/17/2023)  Tobacco Use: Low Risk  (11/16/2023)     Readmission Risk Interventions     No data to display

## 2023-11-17 NOTE — Plan of Care (Signed)
   Problem: Education: Goal: Knowledge of General Education information will improve Description: Including pain rating scale, medication(s)/side effects and non-pharmacologic comfort measures Outcome: Progressing   Problem: Activity: Goal: Risk for activity intolerance will decrease Outcome: Progressing   Problem: Pain Managment: Goal: General experience of comfort will improve and/or be controlled Outcome: Progressing

## 2023-11-17 NOTE — Progress Notes (Signed)
   Subjective: 1 Day Post-Op Procedure(s) (LRB): ARTHROPLASTY, HIP, TOTAL, ANTERIOR APPROACH (Right) Patient reports pain as mild.   Patient seen in rounds by Dr. France Ina. Patient is well, and has had no acute complaints or problems. Denies chest pain or SOB. No issues overnight. Foley catheter removed this AM. We will continue therapy today, ambulated 30' yesterday.   Objective: Vital signs in last 24 hours: Temp:  [97.3 F (36.3 C)-98.5 F (36.9 C)] 97.5 F (36.4 C) (04/17 0544) Pulse Rate:  [57-104] 73 (04/17 0544) Resp:  [10-18] 17 (04/17 0544) BP: (89-167)/(41-88) 160/67 (04/17 0544) SpO2:  [95 %-100 %] 99 % (04/17 0544) Weight:  [59.4 kg] 59.4 kg (04/16 0900)  Intake/Output from previous day:  Intake/Output Summary (Last 24 hours) at 11/17/2023 0851 Last data filed at 11/17/2023 0647 Gross per 24 hour  Intake 2299.78 ml  Output 3825 ml  Net -1525.22 ml     Intake/Output this shift: No intake/output data recorded.  Labs: Recent Labs    11/16/23 0908 11/17/23 0337  HGB 11.6* 11.0*   Recent Labs    11/16/23 0908 11/17/23 0337  WBC  --  13.5*  RBC  --  3.48*  HCT 34.0* 32.8*  PLT  --  361   Recent Labs    11/16/23 0908 11/17/23 0337  NA 136 133*  K 3.7 3.8  CL 101 101  CO2  --  20*  BUN 14 13  CREATININE 0.50 0.31*  GLUCOSE 123* 110*  CALCIUM  --  8.7*   No results for input(s): "LABPT", "INR" in the last 72 hours.  Exam: General - Patient is Alert and Oriented Extremity - Neurologically intact Neurovascular intact Sensation intact distally Dorsiflexion/Plantar flexion intact Dressing - dressing C/D/I Motor Function - intact, moving foot and toes well on exam.   Past Medical History:  Diagnosis Date   Acid reflux    Allergy    Arthritis    Diverticulosis    GERD (gastroesophageal reflux disease)    Heart murmur    as a child only   History of ETT 01/2009   abnormal    History of stomach ulcers 2008   positive h. pylori    Hyperlipidemia    Hypertension    Hypothyroidism    IBS (irritable bowel syndrome)    Interstitial cystitis    Skin cancer    basal and squamous    Assessment/Plan: 1 Day Post-Op Procedure(s) (LRB): ARTHROPLASTY, HIP, TOTAL, ANTERIOR APPROACH (Right) Principal Problem:   OA (osteoarthritis) of hip Active Problems:   Primary osteoarthritis of right hip  Estimated body mass index is 24.75 kg/m as calculated from the following:   Height as of this encounter: 5\' 1"  (1.549 m).   Weight as of this encounter: 59.4 kg. Advance diet Up with therapy D/C IV fluids  DVT Prophylaxis - Aspirin Weight bearing as tolerated. Continue therapy.  Plan is to go Home after hospital stay. Plan for discharge with HEP later today if progresses with therapy and meeting goals. Follow-up in the office in 2 weeks.  The PDMP database was reviewed today prior to any opioid medications being prescribed to this patient.  Sharlynn Dear, PA-C Orthopedic Surgery [phone] 11/17/2023, 8:51 AM

## 2023-11-17 NOTE — Plan of Care (Signed)
  Problem: Education: Goal: Knowledge of General Education information will improve Description: Including pain rating scale, medication(s)/side effects and non-pharmacologic comfort measures Outcome: Progressing   Problem: Health Behavior/Discharge Planning: Goal: Ability to manage health-related needs will improve Outcome: Progressing   Problem: Clinical Measurements: Goal: Ability to maintain clinical measurements within normal limits will improve Outcome: Progressing Goal: Will remain free from infection Outcome: Progressing Goal: Diagnostic test results will improve Outcome: Progressing Goal: Respiratory complications will improve Outcome: Progressing Goal: Cardiovascular complication will be avoided Outcome: Progressing   Problem: Activity: Goal: Risk for activity intolerance will decrease Outcome: Progressing   Problem: Nutrition: Goal: Adequate nutrition will be maintained Outcome: Completed/Met   Problem: Coping: Goal: Level of anxiety will decrease Outcome: Progressing   Problem: Elimination: Goal: Will not experience complications related to bowel motility Outcome: Progressing Goal: Will not experience complications related to urinary retention Outcome: Progressing   Problem: Pain Managment: Goal: General experience of comfort will improve and/or be controlled Outcome: Progressing   Problem: Safety: Goal: Ability to remain free from injury will improve Outcome: Progressing   Problem: Skin Integrity: Goal: Risk for impaired skin integrity will decrease Outcome: Progressing   Problem: Education: Goal: Knowledge of the prescribed therapeutic regimen will improve Outcome: Progressing Goal: Individualized Educational Video(s) Outcome: Completed/Met   Problem: Activity: Goal: Ability to avoid complications of mobility impairment will improve Outcome: Progressing Goal: Ability to tolerate increased activity will improve Outcome: Progressing    Problem: Clinical Measurements: Goal: Postoperative complications will be avoided or minimized Outcome: Progressing   Problem: Pain Management: Goal: Pain level will decrease with appropriate interventions Outcome: Adequate for Discharge   Problem: Skin Integrity: Goal: Will show signs of wound healing Outcome: Progressing

## 2023-11-17 NOTE — Progress Notes (Signed)
 Physical Therapy Treatment Patient Details Name: Marissa Perez MRN: [id number] DOB: 1945-11-30 Today's Date: 11/17/2023   History of Present Illness 78 yo female s/p R DA THA on 11/16/23. PMH: HLD, HTN, interstitial cystitis, IBS    PT Comments  Pt ambulated again in hallway and performed LE with HEP handout.  Pt had no further questions and feels ready for d/c home today.     If plan is discharge home, recommend the following: Assistance with cooking/housework;Help with stairs or ramp for entrance   Can travel by private vehicle        Equipment Recommendations  None recommended by PT    Recommendations for Other Services       Precautions / Restrictions Precautions Precautions: Fall Restrictions Other Position/Activity Restrictions: WBAT     Mobility  Bed Mobility Overal bed mobility: Needs Assistance Bed Mobility: Supine to Sit     Supine to sit: Contact guard     General bed mobility comments: pt in recliner    Transfers Overall transfer level: Needs assistance Equipment used: Rolling walker (2 wheels) Transfers: Sit to/from Stand Sit to Stand: Contact guard assist, Supervision           General transfer comment: cues for hand placement and R LE position    Ambulation/Gait Ambulation/Gait assistance: Contact guard assist, Supervision Gait Distance (Feet): 100 Feet Assistive device: Rolling walker (2 wheels) Gait Pattern/deviations: Step-to pattern, Decreased stance time - right, Antalgic Gait velocity: decr     General Gait Details: verbal cues for sequence, RW positioning, posture, step length   Stairs Stairs: Yes Stairs assistance: Contact guard assist Stair Management: Step to pattern, Forwards, One rail Left Number of Stairs: 2 General stair comments: verbal cues for sequence and safety   Wheelchair Mobility     Tilt Bed    Modified Rankin (Stroke Patients Only)       Balance                                             Communication Communication Communication: No apparent difficulties  Cognition Arousal: Alert Behavior During Therapy: WFL for tasks assessed/performed   PT - Cognitive impairments: No apparent impairments                                Cueing Cueing Techniques: Verbal cues  Exercises Total Joint Exercises Ankle Circles/Pumps: AROM, Both, 10 reps Quad Sets: AROM, Both, 10 reps Heel Slides: AAROM, Right, 10 reps Hip ABduction/ADduction: AAROM, AROM, Supine, Standing, 10 reps, Right Long Arc Quad: AROM, Right, 10 reps, Seated Knee Flexion: AROM, Right, Standing, 10 reps Marching in Standing: AROM, Right, 10 reps, Standing Standing Hip Extension: AROM, Right, Standing, 10 reps    General Comments        Pertinent Vitals/Pain Pain Assessment Pain Assessment: 0-10 Pain Score: 6  Pain Location: right hip Pain Descriptors / Indicators: Grimacing, Sore, Aching Pain Intervention(s): Repositioned, Monitored during session    Home Living                          Prior Function            PT Goals (current goals can now be found in the care plan section) Progress towards PT goals: Progressing toward goals  Frequency    7X/week      PT Plan      Co-evaluation              AM-PAC PT "6 Clicks" Mobility   Outcome Measure  Help needed turning from your back to your side while in a flat bed without using bedrails?: A Little Help needed moving from lying on your back to sitting on the side of a flat bed without using bedrails?: A Little Help needed moving to and from a bed to a chair (including a wheelchair)?: A Little Help needed standing up from a chair using your arms (e.g., wheelchair or bedside chair)?: A Little Help needed to walk in hospital room?: A Little Help needed climbing 3-5 steps with a railing? : A Little 6 Click Score: 18    End of Session Equipment Utilized During Treatment: Gait belt Activity Tolerance:  Patient tolerated treatment well Patient left: in chair;with call bell/phone within reach;with chair alarm set Nurse Communication: Mobility status PT Visit Diagnosis: Other abnormalities of gait and mobility (R26.89)     Time: 1610-9604 PT Time Calculation (min) (ACUTE ONLY): 17 min  Charges:     $Therapeutic Exercise: 8-22 mins PT General Charges $$ ACUTE PT VISIT: 1 Visit                     Marissa Perez, DPT Physical Therapist Acute Rehabilitation Services Office: [phone]    Marissa Perez Payson 11/17/2023, 4:16 PM

## 2023-11-17 NOTE — Progress Notes (Signed)
 Physical Therapy Treatment Patient Details Name: Marissa Perez MRN: [id number] DOB: [birth year] Today's Date: 11/17/2023   History of Present Illness 78 yo female s/p R DA THA on 11/16/23. PMH: HLD, HTN, interstitial cystitis, IBS    PT Comments  Pt ambulated in hallway and practiced safe stair technique.  Pt slow to mobilize however no physical assist required.  Pt anticipates d/c home later today.  Plan to return to ambulate again and review HEP.     If plan is discharge home, recommend the following: Assistance with cooking/housework;Help with stairs or ramp for entrance;A little help with bathing/dressing/bathroom   Can travel by private vehicle        Equipment Recommendations  None recommended by PT    Recommendations for Other Services       Precautions / Restrictions Precautions Precautions: Fall Restrictions Other Position/Activity Restrictions: WBAT     Mobility  Bed Mobility Overal bed mobility: Needs Assistance Bed Mobility: Supine to Sit     Supine to sit: Contact guard     General bed mobility comments: for safety    Transfers Overall transfer level: Needs assistance Equipment used: Rolling walker (2 wheels) Transfers: Sit to/from Stand Sit to Stand: Contact guard assist           General transfer comment: cues for hand placement and R LE position    Ambulation/Gait Ambulation/Gait assistance: Contact guard assist Gait Distance (Feet): 60 Feet Assistive device: Rolling walker (2 wheels) Gait Pattern/deviations: Step-to pattern, Decreased stance time - right, Antalgic Gait velocity: decr     General Gait Details: verbal cues for sequence, RW positioning, posture, step length   Stairs Stairs: Yes Stairs assistance: Contact guard assist Stair Management: Step to pattern, Forwards, One rail Left Number of Stairs: 2 General stair comments: verbal cues for sequence and safety   Wheelchair Mobility     Tilt Bed    Modified Rankin  (Stroke Patients Only)       Balance                                            Communication Communication Communication: No apparent difficulties  Cognition Arousal: Alert Behavior During Therapy: WFL for tasks assessed/performed   PT - Cognitive impairments: No apparent impairments                                Cueing Cueing Techniques: Verbal cues  Exercises      General Comments        Pertinent Vitals/Pain Pain Assessment Pain Assessment: 0-10 Pain Score: 5  Pain Location: right hip Pain Descriptors / Indicators: Grimacing, Sore, Aching Pain Intervention(s): Monitored during session, Repositioned, Premedicated before session, Ice applied    Home Living                          Prior Function            PT Goals (current goals can now be found in the care plan section) Progress towards PT goals: Progressing toward goals    Frequency    7X/week      PT Plan      Co-evaluation              AM-PAC PT "6 Clicks" Mobility   Outcome Measure  Help needed turning from your back to your side while in a flat bed without using bedrails?: A Little Help needed moving from lying on your back to sitting on the side of a flat bed without using bedrails?: A Little Help needed moving to and from a bed to a chair (including a wheelchair)?: A Little Help needed standing up from a chair using your arms (e.g., wheelchair or bedside chair)?: A Little Help needed to walk in hospital room?: A Little Help needed climbing 3-5 steps with a railing? : A Little 6 Click Score: 18    End of Session Equipment Utilized During Treatment: Gait belt Activity Tolerance: Patient tolerated treatment well Patient left: in chair;with call bell/phone within reach;with chair alarm set Nurse Communication: Mobility status PT Visit Diagnosis: Other abnormalities of gait and mobility (R26.89)     Time: 1610-9604 PT Time Calculation  (min) (ACUTE ONLY): 17 min  Charges:    $Gait Training: 8-22 mins PT General Charges $$ ACUTE PT VISIT: 1 Visit                     Blanch Bunde, DPT Physical Therapist Acute Rehabilitation Services Office: [phone]    Marissa Perez Payson 11/17/2023, 12:20 PM

## 2023-11-17 NOTE — Care Management Obs Status (Signed)
 MEDICARE OBSERVATION STATUS NOTIFICATION   Patient Details  Name: Marissa Perez MRN: [id number] Date of Birth: 20-May-1946   Medicare Observation Status Notification Given:  Yes    Bari Leys, RN 11/17/2023, 9:32 AM

## 2023-11-21 NOTE — Discharge Summary (Signed)
 Patient ID: Marissa Perez MRN: [id number] DOB/AGE: October 29, 1945 78 y.o.  Admit date: 11/16/2023 Discharge date: 11/17/2023  Admission Diagnoses:  Principal Problem:   OA (osteoarthritis) of hip Active Problems:   Primary osteoarthritis of right hip   Discharge Diagnoses:  Same  Past Medical History:  Diagnosis Date   Acid reflux    Allergy    Arthritis    Diverticulosis    GERD (gastroesophageal reflux disease)    Heart murmur    as a child only   History of ETT 01/2009   abnormal    History of stomach ulcers 2008   positive h. pylori   Hyperlipidemia    Hypertension    Hypothyroidism    IBS (irritable bowel syndrome)    Interstitial cystitis    Skin cancer    basal and squamous    Surgeries: Procedure(s): ARTHROPLASTY, HIP, TOTAL, ANTERIOR APPROACH on 11/16/2023   Consultants:   Discharged Condition: Improved  Hospital Course: Marissa Perez is an 78 y.o. female who was admitted 11/16/2023 for operative treatment ofOA (osteoarthritis) of hip. Patient has severe unremitting pain that affects sleep, daily activities, and work/hobbies. After pre-op clearance the patient was taken to the operating room on 11/16/2023 and underwent  Procedure(s): ARTHROPLASTY, HIP, TOTAL, ANTERIOR APPROACH.    Patient was given perioperative antibiotics:  Anti-infectives (From admission, onward)    Start     Dose/Rate Route Frequency Ordered Stop   11/16/23 2200  vancomycin  (VANCOCIN ) IVPB 1000 mg/200 mL premix        1,000 mg 200 mL/hr over 60 Minutes Intravenous Every 12 hours 11/16/23 1454 11/16/23 2345   11/16/23 0845  vancomycin  (VANCOCIN ) IVPB 1000 mg/200 mL premix        1,000 mg 200 mL/hr over 60 Minutes Intravenous On call to O.R. 11/16/23 0839 11/16/23 1036        Patient was given sequential compression devices, early ambulation, and chemoprophylaxis to prevent DVT.  Patient benefited maximally from hospital stay and there were no complications.    Recent vital  signs: No data found.   Recent laboratory studies: No results for input(s): "WBC", "HGB", "HCT", "PLT", "NA", "K", "CL", "CO2", "BUN", "CREATININE", "GLUCOSE", "INR", "CALCIUM " in the last 72 hours.  Invalid input(s): "PT", "2"   Discharge Medications:   Allergies as of 11/17/2023       Reactions   Codeine    Headache    Femring [estradiol] Rash   Cephalexin Rash   Erythromycin Other (See Comments)   Gi  Upset    Penicillins Rash        Medication List     STOP taking these medications    diclofenac  75 MG EC tablet Commonly known as: VOLTAREN        TAKE these medications    amLODipine  5 MG tablet Commonly known as: NORVASC  Take 1 tablet (5 mg total) by mouth daily. With losartan  for BP   aspirin  81 MG chewable tablet Chew 1 tablet (81 mg total) by mouth 2 (two) times daily for 21 days. Then take one 81 mg aspirin  once a day for three weeks. Then discontinue aspirin .   atorvastatin  20 MG tablet Commonly known as: LIPITOR Take 1 tablet (20 mg total) by mouth daily. What changed: when to take this   CAL/MAG PO Take 1 capsule by mouth in the morning and at bedtime.  Calcium  & Magnesium  Mineral Complex 750 mg   carboxymethylcellulose 0.5 % Soln Commonly known as: REFRESH PLUS 1-2 drops 3 (three)  times daily as needed (dry/irritated eyes.).   clobetasol 0.05 % external solution Commonly known as: TEMOVATE Apply 1 application  topically daily as needed (scalp irritation).   COSAMIN DS PO Take 1 tablet by mouth in the morning and at bedtime.   cyanocobalamin 1000 MCG tablet Commonly known as: VITAMIN B12 Take 10,000 mcg by mouth in the morning.   diclofenac  Sodium 1 % Gel Commonly known as: VOLTAREN  Apply 4 g topically 4 (four) times daily as needed (pain.).   ezetimibe  10 MG tablet Commonly known as: ZETIA  Take 1 tablet (10 mg total) by mouth daily. What changed: when to take this   gabapentin  100 MG capsule Commonly known as: NEURONTIN  TAKE 1  CAPSULE EVERY MORNING AND 2 CAPSULES AT BEDTIME   HYDROmorphone  2 MG tablet Commonly known as: DILAUDID  Take 0.5-1 tablets (1-2 mg total) by mouth every 6 (six) hours as needed for severe pain (pain score 7-10).   icosapent  Ethyl 1 g capsule Commonly known as: Vascepa  Take 2 capsules (2 g total) by mouth 2 (two) times daily.   levothyroxine  50 MCG tablet Commonly known as: SYNTHROID  Take 1/2 tablet on Mon, Wed, Fri and 1 tablet daily Tues and Thursday   losartan  100 MG tablet Commonly known as: COZAAR  Take 1 tablet (100 mg total) by mouth daily.   methocarbamol  500 MG tablet Commonly known as: ROBAXIN  Take 1 tablet (500 mg total) by mouth every 6 (six) hours as needed for muscle spasms.   omeprazole  20 MG capsule Commonly known as: PRILOSEC Take 1 capsule (20 mg total) by mouth 2 (two) times daily as needed. What changed: when to take this   ondansetron  4 MG tablet Commonly known as: ZOFRAN  Take 1 tablet (4 mg total) by mouth every 6 (six) hours as needed for nausea.   PROBIOTIC DAILY PO Take 1 capsule by mouth at bedtime.   traMADol  50 MG tablet Commonly known as: ULTRAM  Take 1-2 tablets (50-100 mg total) by mouth every 6 (six) hours as needed for moderate pain (pain score 4-6). What changed:  how much to take when to take this reasons to take this   Vitamin D -3 125 MCG (5000 UT) Tabs Take 5,000 Units by mouth in the morning.   ZINC GLUCONATE PO Take 50 mg by mouth in the morning.               Discharge Care Instructions  (From admission, onward)           Start     Ordered   11/17/23 0000  Weight bearing as tolerated        11/17/23 0853   11/17/23 0000  Change dressing       Comments: You have an adhesive waterproof bandage over the incision. Leave this in place until your first follow-up appointment. Once you remove this you will not need to place another bandage.   11/17/23 0853            Diagnostic Studies: DG HIP UNILAT WITH PELVIS  1V RIGHT Result Date: 11/16/2023 CLINICAL DATA:  Elective surgery. EXAM: DG HIP (WITH OR WITHOUT PELVIS) 1V RIGHT COMPARISON:  None Available. FINDINGS: Two fluoroscopic spot views of the pelvis and right hip obtained in the operating room. Images during hip arthroplasty. Fluoroscopy time 42 seconds. Dose 1.009 mGy. IMPRESSION: Intraoperative fluoroscopy during right hip arthroplasty. Electronically Signed   By: Chadwick Colonel M.D.   On: 11/16/2023 14:18   DG C-Arm 1-60 Min-No Report Result Date: 11/16/2023 Fluoroscopy  was utilized by the requesting physician.  No radiographic interpretation.   DG C-Arm 1-60 Min-No Report Result Date: 11/16/2023 Fluoroscopy was utilized by the requesting physician.  No radiographic interpretation.    Disposition: Discharge disposition: 01-Home or Self Care       Discharge Instructions     Call MD / Call 911   Complete by: As directed    If you experience chest pain or shortness of breath, CALL 911 and be transported to the hospital emergency room.  If you develope a fever above 101 F, pus (white drainage) or increased drainage or redness at the wound, or calf pain, call your surgeon's office.   Change dressing   Complete by: As directed    You have an adhesive waterproof bandage over the incision. Leave this in place until your first follow-up appointment. Once you remove this you will not need to place another bandage.   Constipation Prevention   Complete by: As directed    Drink plenty of fluids.  Prune juice may be helpful.  You may use a stool softener, such as Colace (over the counter) 100 mg twice a day.  Use MiraLax  (over the counter) for constipation as needed.   Diet - low sodium heart healthy   Complete by: As directed    Do not sit on low chairs, stoools or toilet seats, as it may be difficult to get up from low surfaces   Complete by: As directed    Driving restrictions   Complete by: As directed    No driving for two weeks    Post-operative opioid taper instructions:   Complete by: As directed    POST-OPERATIVE OPIOID TAPER INSTRUCTIONS: It is important to wean off of your opioid medication as soon as possible. If you do not need pain medication after your surgery it is ok to stop day one. Opioids include: Codeine, Hydrocodone(Norco, Vicodin), Oxycodone(Percocet, oxycontin) and hydromorphone  amongst others.  Long term and even short term use of opiods can cause: Increased pain response Dependence Constipation Depression Respiratory depression And more.  Withdrawal symptoms can include Flu like symptoms Nausea, vomiting And more Techniques to manage these symptoms Hydrate well Eat regular healthy meals Stay active Use relaxation techniques(deep breathing, meditating, yoga) Do Not substitute Alcohol to help with tapering If you have been on opioids for less than two weeks and do not have pain than it is ok to stop all together.  Plan to wean off of opioids This plan should start within one week post op of your joint replacement. Maintain the same interval or time between taking each dose and first decrease the dose.  Cut the total daily intake of opioids by one tablet each day Next start to increase the time between doses. The last dose that should be eliminated is the evening dose.      TED hose   Complete by: As directed    Use stockings (TED hose) for three weeks on both leg(s).  You may remove them at night for sleeping.   Weight bearing as tolerated   Complete by: As directed         Follow-up Information     Liliane Rei, MD. Go on 11/29/2023.   Specialty: Orthopedic Surgery Why: You have a post op appointment scheduled for Tuesday 11/29/23 at 5:15pm Contact information: [address] [place] [address] [phone]                  Signed: Sharlynn Dear  11/21/2023, 11:26 AM

## 2023-12-20 DIAGNOSIS — Z5189 Encounter for other specified aftercare: Secondary | ICD-10-CM | POA: Diagnosis not present

## 2023-12-22 DIAGNOSIS — M75121 Complete rotator cuff tear or rupture of right shoulder, not specified as traumatic: Secondary | ICD-10-CM | POA: Diagnosis not present

## 2023-12-22 DIAGNOSIS — M7551 Bursitis of right shoulder: Secondary | ICD-10-CM | POA: Diagnosis not present

## 2023-12-28 ENCOUNTER — Ambulatory Visit: Admitting: Family Medicine

## 2023-12-29 ENCOUNTER — Ambulatory Visit: Attending: Orthopedic Surgery | Admitting: Physical Therapy

## 2023-12-29 ENCOUNTER — Other Ambulatory Visit: Payer: Self-pay

## 2023-12-29 DIAGNOSIS — M6281 Muscle weakness (generalized): Secondary | ICD-10-CM | POA: Diagnosis not present

## 2023-12-29 DIAGNOSIS — M25511 Pain in right shoulder: Secondary | ICD-10-CM | POA: Diagnosis not present

## 2023-12-29 DIAGNOSIS — M25611 Stiffness of right shoulder, not elsewhere classified: Secondary | ICD-10-CM | POA: Insufficient documentation

## 2023-12-29 NOTE — Therapy (Signed)
 OUTPATIENT PHYSICAL THERAPY SHOULDER EVALUATION   Patient Name: Marissa Perez MRN: [id number] DOB:02/06/1946, 78 y.o., female Today's Date: 12/29/2023  END OF SESSION:  PT End of Session - 12/29/23 1124     Visit Number 1    Number of Visits 12    Date for PT Re-Evaluation 02/09/24    PT Start Time 1054    PT Stop Time 1149    PT Time Calculation (min) 55 min    Activity Tolerance Patient tolerated treatment well    Behavior During Therapy [facility] for tasks assessed/performed             Past Medical History:  Diagnosis Date   Acid reflux    Allergy    Arthritis    Diverticulosis    GERD (gastroesophageal reflux disease)    Heart murmur    as a child only   History of ETT 01/2009   abnormal    History of stomach ulcers 2008   positive h. pylori   Hyperlipidemia    Hypertension    Hypothyroidism    IBS (irritable bowel syndrome)    Interstitial cystitis    Skin cancer    basal and squamous   Past Surgical History:  Procedure Laterality Date   CESAREAN SECTION  08/02/1970   EYE SURGERY Bilateral 10/01/2023   cataract extraction w/ IOL   MOHS SURGERY  2005   scalp   TONSILLECTOMY  08/02/1949   TOTAL ABDOMINAL HYSTERECTOMY W/ BILATERAL SALPINGOOPHORECTOMY  08/02/1988   Dr. Felipe Horton / fibroids    TOTAL HIP ARTHROPLASTY Right 11/16/2023   Procedure: ARTHROPLASTY, HIP, TOTAL, ANTERIOR APPROACH;  Surgeon: Liliane Rei, MD;  Location: [facility];  Service: Orthopedics;  Laterality: Right;   Patient Active Problem List   Diagnosis Date Noted   OA (osteoarthritis) of hip 11/16/2023   Primary osteoarthritis of right hip 11/16/2023   Osteoarthritis of first carpometacarpal joint of left hand 01/15/2022   Osteoarthritis of first carpometacarpal joint of right hand 01/15/2022   Pain in right knee 08/22/2019   Osteoarthritis of multiple joints 01/25/2019   Lumbar spondylosis 12/28/2017   Degeneration of lumbar intervertebral disc 12/13/2017   Aortic atherosclerosis (HCC)  09/22/2017   Family history of breast cancer 07/05/2016   Skin cancer 07/05/2016   Vitamin D  deficiency 07/05/2016   Hypothyroidism 12/11/2012   Hyperlipidemia 12/11/2012   Hypertension 12/11/2012   GERD (gastroesophageal reflux disease) 12/11/2012   REFERRING PROVIDER: Carter Clare MD  REFERRING DIAG: Subacromial bursitis of right shoulder.  THERAPY DIAG:  Acute pain of right shoulder  Stiffness of right shoulder, not elsewhere classified  Rationale for Evaluation and Treatment: Rehabilitation  ONSET DATE: March, 2025.  SUBJECTIVE:  SUBJECTIVE STATEMENT: The patient presents tot he clinic with c/o worsening right shoulder pain since March of this year.  Her pain is rated at a 5-6/10 today and higher with moving and stretching.  Inactivity decreases her pain.  Her pain is described as an ache and sharp and shooting when reaching.  PERTINENT HISTORY: See above.    PAIN:  Are you having pain? Yes: NPRS scale: 5-6/10. Pain location: Right shoulder. Pain description: As above. Aggravating factors: As above. Relieving factors: As above.    PRECAUTIONS: None  RED FLAGS: None   WEIGHT BEARING RESTRICTIONS: No  FALLS:  Has patient fallen in last 6 months? No  LIVING ENVIRONMENT: Lives with: lives with their spouse Lives in: House/apartment Has following equipment at home: Hurry cane.  OCCUPATION: Retired.  PLOF: Independent  PATIENT GOALS:  Use right UE without pain.  NEXT MD VISIT:   OBJECTIVE:  Note: Objective measures were completed at Evaluation unless otherwise noted.  PATIENT SURVEYS:  Quick Dash 68.18   UPPER EXTREMITY ROM:   Active right shoulder antigravity flexion to 95 degrees and painful and passive to 115 degrees.  ER actively to 45 degrees and passive to 70 degrees  and behind back motion to lower lumbar region.  UPPER EXTREMITY MMT:  IR/ER strength tested with elbow by side is 4 to 4+/5.  Deltoid strength decreased to 3-/5 which appears decreased at least in part due to pain.   SHOULDER SPECIAL TESTS: (-) right Drop Arm test.  Some pain reproduction with impingement testing.    PALPATION:  Tender to palpation at right acromial ridge and middle deltoid and right UT.                                                                                                                               TREATMENT DATE: 12/29/23:   IFC at 80-150 Hz on 40% scan x 20 minutes to patient's right shoulder f/b STW/M x 8 minutes with ischemic release technique to right middle deltoid.  Normal modality response following removal of modality.     PATIENT EDUCATION: Education details: See below. Person educated: Patient Education method: Medical illustrator Education comprehension: verbalized understanding  HOME EXERCISE PROGRAM: HOME EXERCISE PROGRAM [7CSZXKN]  Shoulder Flexion Wall Walking Exercise -  Repeat 10 Repetitions, Hold 10 Seconds, Complete 2 Sets, Perform 3 Times a Day  ASSESSMENT:  CLINICAL IMPRESSION: The patient presents to [facility] with c/o right shoulder pain since March of this years. She has limited active range of motion which appears related to pain.  She demonstrates a negative Drop Arm test but does have some pain reproduction with impingement testing.  Her Quck DASH score is 68.18. She is tender to palpation at right acromial ridge and middle deltoid and right UT.  Patient will benefit from skilled physical therapy intervention to address pain and deficits.  OBJECTIVE IMPAIRMENTS: decreased activity tolerance, decreased ROM, decreased strength, increased muscle spasms, and pain.  ACTIVITY LIMITATIONS: carrying, lifting, and reach over head  PARTICIPATION LIMITATIONS: meal prep, cleaning, laundry, and yard work  [facility] POTENTIAL:  Excellent  CLINICAL DECISION MAKING: Stable/uncomplicated  EVALUATION COMPLEXITY: Low   GOALS:  SHORT TERM GOALS: Target date: 01/12/24  Ind with a initial HEP. Goal status: INITIAL   LONG TERM GOALS: Target date: 02/09/24  Ind with an advanced HEP.  Goal status: INITIAL  2.  Active right shoulder flexion to 145 degrees so the patient can easily reach overhead.  Goal status: INITIAL  3.  Increase right shoulder strength to a solid 4+/5 to increase stability for performance of functional activities.  Goal status: INITIAL  4.  Perform ADL's with pain not > 3/10.  Goal status: INITIAL   PLAN:  PT FREQUENCY: 2x/week  PT DURATION: 6 weeks  PLANNED INTERVENTIONS: 97110-Therapeutic exercises, 97530- Therapeutic activity, [id number]- Neuromuscular re-education, 97535- Self Care, 40981- Manual therapy, G0283- Electrical stimulation (unattended), 97016- Vasopneumatic device, 97035- Ultrasound, Patient/Family education, Cryotherapy, and Moist heat  PLAN FOR NEXT SESSION: Combo e'stim/US , STW/M, UE Ranger, wall ladder.  XB1.   Ariea Rochin, [place], PT 12/29/2023, 12:06 PM

## 2024-01-02 ENCOUNTER — Ambulatory Visit: Payer: Self-pay

## 2024-01-02 ENCOUNTER — Ambulatory Visit: Attending: Orthopedic Surgery | Admitting: Physical Therapy

## 2024-01-02 DIAGNOSIS — M25611 Stiffness of right shoulder, not elsewhere classified: Secondary | ICD-10-CM | POA: Insufficient documentation

## 2024-01-02 DIAGNOSIS — M25511 Pain in right shoulder: Secondary | ICD-10-CM | POA: Insufficient documentation

## 2024-01-02 DIAGNOSIS — M6281 Muscle weakness (generalized): Secondary | ICD-10-CM | POA: Insufficient documentation

## 2024-01-02 NOTE — Telephone Encounter (Signed)
  Chief Complaint: feet swelling  Symptoms: pain   Disposition: [] ED /[] Urgent Care (no appt availability in office) / [x] Appointment(In office/virtual)/ []  [facility]/ [] Home Care/ [] Refused Recommended Disposition /[] [facility]/ []  Follow-up with PCP Additional Notes: Marissa Perez calling with bilateral feet swelling that started 4- 5 days ago. Marissa Perez had hip replacement surgery 6 weeks ago and has been in contact with ortho. Marissa Perez was told she could restart Meloxicam  for shoulder pain. Marissa Perez feels that is when the feet swelling started. Marissa Perez hasn't taken the medication since Friday but feels feet are still swollen. Marissa Perez states swelling/pain gets worse as day goes on. Marissa Perez elevates and  uses ice packs as needed.  Marissa Perez denies chest pain an SOB. Marissa Perez has appt 6/3 with PCP. RN gave care advice and Marissa Perez verbalized understanding.          Copied from CRM [id number]. Topic: Clinical - Red Word Triage >> Jan 02, 2024  8:25 AM Marissa Perez wrote: Red Word that prompted transfer to Nurse Triage: Patient Swelling in both feet and ankles after surgery worse tring to walk by end of day Reason for Disposition  [1] MILD swelling of both ankles (i.e., pedal edema) AND [2] new-onset or worsening  Answer Assessment - Initial Assessment Questions 1. ONSET: "When did the swelling start?" (e.g., minutes, hours, days)     Last week  2. LOCATION: "What part of the leg is swollen?"  "Are both legs swollen or just one leg?"     Both feet  3. SEVERITY: "How bad is the swelling?" (e.g., localized; mild, moderate, severe)   - Localized: Small area of swelling localized to one leg.   - MILD pedal edema: Swelling limited to foot and ankle, pitting edema < 1/4 inch (6 mm) deep, rest and elevation eliminate most or all swelling.   - MODERATE edema: Swelling of lower leg to knee, pitting edema > 1/4 inch (6 mm) deep, rest and elevation only partially reduce swelling.   - SEVERE edema: Swelling extends above knee, facial or hand  swelling present.      Not sure  4. REDNESS: "Does the swelling look red or infected?"     red 5. PAIN: "Is the swelling painful to touch?" If Yes, ask: "How painful is it?"   (Scale 1-10; mild, moderate or severe)     1 during day and at night 4-5 6. FEVER: "Do you have a fever?" If Yes, ask: "What is it, how was it measured, and when did it start?"      Denies  7. CAUSE: "What do you think is causing the leg swelling?"     Possibly Meloxicam   8. MEDICAL HISTORY: "Do you have a history of blood clots (e.g., DVT), cancer, heart failure, kidney disease, or liver failure?"     Denies  9. RECURRENT SYMPTOM: "Have you had leg swelling before?" If Yes, ask: "When was the last time?" "What happened that time?"     na 10. OTHER SYMPTOMS: "Do you have any other symptoms?" (e.g., chest pain, difficulty breathing)       Denies  Protocols used: Leg Swelling and Edema-A-AH

## 2024-01-02 NOTE — Therapy (Addendum)
 OUTPATIENT PHYSICAL THERAPY SHOULDER TREATMENT  Patient Name: Marissa Perez MRN: [id number] DOB:April 30, 1946, 78 y.o., female Today's Date: 01/02/2024  END OF SESSION:  PT End of Session - 01/02/24 1008     Visit Number 2    Number of Visits 12    Date for PT Re-Evaluation 02/09/24    Authorization Type FOTO AT LEAST EVERY 5TH VISIT.  PROGRESS NOTE AT 10TH VISIT.  KX MODIFIER AFTER 15 VISITS.    PT Start Time 0930    PT Stop Time 1023    PT Time Calculation (min) 53 min    Activity Tolerance Patient tolerated treatment well    Behavior During Therapy WFL for tasks assessed/performed             Past Medical History:  Diagnosis Date   Acid reflux    Allergy    Arthritis    Diverticulosis    GERD (gastroesophageal reflux disease)    Heart murmur    as a child only   History of ETT 01/2009   abnormal    History of stomach ulcers 2008   positive h. pylori   Hyperlipidemia    Hypertension    Hypothyroidism    IBS (irritable bowel syndrome)    Interstitial cystitis    Skin cancer    basal and squamous   Past Surgical History:  Procedure Laterality Date   CESAREAN SECTION  08/02/1970   EYE SURGERY Bilateral 10/01/2023   cataract extraction w/ IOL   MOHS SURGERY  2005   scalp   TONSILLECTOMY  08/02/1949   TOTAL ABDOMINAL HYSTERECTOMY W/ BILATERAL SALPINGOOPHORECTOMY  08/02/1988   Dr. Felipe Horton / fibroids    TOTAL HIP ARTHROPLASTY Right 11/16/2023   Procedure: ARTHROPLASTY, HIP, TOTAL, ANTERIOR APPROACH;  Surgeon: Liliane Rei, MD;  Location: [facility];  Service: Orthopedics;  Laterality: Right;   Patient Active Problem List   Diagnosis Date Noted   OA (osteoarthritis) of hip 11/16/2023   Primary osteoarthritis of right hip 11/16/2023   Osteoarthritis of first carpometacarpal joint of left hand 01/15/2022   Osteoarthritis of first carpometacarpal joint of right hand 01/15/2022   Pain in right knee 08/22/2019   Osteoarthritis of multiple joints 01/25/2019   Lumbar  spondylosis 12/28/2017   Degeneration of lumbar intervertebral disc 12/13/2017   Aortic atherosclerosis (HCC) 09/22/2017   Family history of breast cancer 07/05/2016   Skin cancer 07/05/2016   Vitamin D  deficiency 07/05/2016   Hypothyroidism 12/11/2012   Hyperlipidemia 12/11/2012   Hypertension 12/11/2012   GERD (gastroesophageal reflux disease) 12/11/2012   REFERRING PROVIDER: Carter Clare MD  REFERRING DIAG: Subacromial bursitis of right shoulder.  THERAPY DIAG:  Acute pain of right shoulder  Stiffness of right shoulder, not elsewhere classified  Muscle weakness (generalized)  Rationale for Evaluation and Treatment: Rehabilitation  ONSET DATE: March, 2025.  SUBJECTIVE:  SUBJECTIVE STATEMENT: No new complaints.  PERTINENT HISTORY: See above.    PAIN:  Are you having pain? Yes: NPRS scale: 5-6/10. Pain location: Right shoulder. Pain description: As above. Aggravating factors: As above. Relieving factors: As above.    PRECAUTIONS: None  RED FLAGS: None   WEIGHT BEARING RESTRICTIONS: No  FALLS:  Has patient fallen in last 6 months? No  LIVING ENVIRONMENT: Lives with: lives with their spouse Lives in: House/apartment Has following equipment at home: Hurry cane.  OCCUPATION: Retired.  PLOF: Independent  PATIENT GOALS:  Use right UE without pain.  NEXT MD VISIT:   OBJECTIVE:  Note: Objective measures were completed at Evaluation unless otherwise noted.  PATIENT SURVEYS:  Quick Dash 68.18   UPPER EXTREMITY ROM:   Active right shoulder antigravity flexion to 95 degrees and painful and passive to 115 degrees.  ER actively to 45 degrees and passive to 70 degrees and behind back motion to lower lumbar region.  UPPER EXTREMITY MMT:  IR/ER strength tested with elbow by side  is 4 to 4+/5.  Deltoid strength decreased to 3-/5 which appears decreased at least in part due to pain.   SHOULDER SPECIAL TESTS: (-) right Drop Arm test.  Some pain reproduction with impingement testing.    PALPATION:  Tender to palpation at right acromial ridge and middle deltoid and right UT.                                                                                                                               TREATMENT DATE:  01/02/24:  Combo e'stim/US  at 1.50 W/CM2 x 12 minutes f/b STW/M with ischemic release technique x 11 minutes f/b IFC at 80-150 Hz on 40% scan x 20 minutes to patient's right shoulder. Normal modality response following removal of modality.     12/29/23:   IFC at 80-150 Hz on 40% scan x 20 minutes to patient's right shoulder f/b STW/M x 8 minutes with ischemic release technique to right middle deltoid.  Normal modality response following removal of modality.     PATIENT EDUCATION: Education details: See below. Person educated: Patient Education method: Medical illustrator Education comprehension: verbalized understanding  HOME EXERCISE PROGRAM: HOME EXERCISE PROGRAM [7CSZXKN]  Shoulder Flexion Wall Walking Exercise -  Repeat 10 Repetitions, Hold 10 Seconds, Complete 2 Sets, Perform 3 Times a Day  ASSESSMENT:  CLINICAL IMPRESSION: Patient did well with with treatment and felt better following treatment.    OBJECTIVE IMPAIRMENTS: decreased activity tolerance, decreased ROM, decreased strength, increased muscle spasms, and pain.   ACTIVITY LIMITATIONS: carrying, lifting, and reach over head  PARTICIPATION LIMITATIONS: meal prep, cleaning, laundry, and yard work  [facility] POTENTIAL: Excellent  CLINICAL DECISION MAKING: Stable/uncomplicated  EVALUATION COMPLEXITY: Low   GOALS:  SHORT TERM GOALS: Target date: 01/12/24  Ind with a initial HEP. Goal status: INITIAL   LONG TERM GOALS: Target date: 02/09/24  Ind with an advanced HEP.   Goal status:  INITIAL  2.  Active right shoulder flexion to 145 degrees so the patient can easily reach overhead.  Goal status: INITIAL  3.  Increase right shoulder strength to a solid 4+/5 to increase stability for performance of functional activities.  Goal status: INITIAL  4.  Perform ADL's with pain not > 3/10.  Goal status: INITIAL   PLAN:  PT FREQUENCY: 2x/week  PT DURATION: 6 weeks  PLANNED INTERVENTIONS: 97110-Therapeutic exercises, 97530- Therapeutic activity, [id number]- Neuromuscular re-education, 97535- Self Care, 81191- Manual therapy, G0283- Electrical stimulation (unattended), 97016- Vasopneumatic device, 97035- Ultrasound, Patient/Family education, Cryotherapy, and Moist heat  PLAN FOR NEXT SESSION: Combo e'stim/US , STW/M, UE Ranger, wall ladder.  YN8.   Kimika Streater, [place], PT 01/02/2024, 10:39 AM

## 2024-01-03 ENCOUNTER — Other Ambulatory Visit: Payer: Self-pay | Admitting: Family Medicine

## 2024-01-03 ENCOUNTER — Ambulatory Visit: Admitting: Family Medicine

## 2024-01-03 ENCOUNTER — Ambulatory Visit: Admitting: Physical Therapy

## 2024-01-03 ENCOUNTER — Encounter: Payer: Self-pay | Admitting: Family Medicine

## 2024-01-03 ENCOUNTER — Ambulatory Visit: Payer: Self-pay | Admitting: Family Medicine

## 2024-01-03 VITALS — BP 114/63 | HR 68 | Temp 98.5°F | Ht 61.0 in | Wt 141.2 lb

## 2024-01-03 DIAGNOSIS — M25511 Pain in right shoulder: Secondary | ICD-10-CM | POA: Diagnosis not present

## 2024-01-03 DIAGNOSIS — R600 Localized edema: Secondary | ICD-10-CM

## 2024-01-03 DIAGNOSIS — M25611 Stiffness of right shoulder, not elsewhere classified: Secondary | ICD-10-CM | POA: Diagnosis not present

## 2024-01-03 DIAGNOSIS — M6281 Muscle weakness (generalized): Secondary | ICD-10-CM

## 2024-01-03 LAB — SPECIMEN STATUS REPORT

## 2024-01-03 NOTE — Patient Instructions (Signed)
 Edema  Edema is when you have too much fluid in your body or under your skin. Edema may make your legs, feet, and ankles swell. Swelling often happens in looser tissues, such as around your eyes. This is a common condition. It gets more common as you get older. There are many possible causes of edema. These include: Eating too much salt (sodium). Being on your feet or sitting for a long time. Certain medical conditions, such as: Pregnancy. Heart failure. Liver disease. Kidney disease. Cancer. Hot weather may make edema worse. Edema is usually painless. Your skin may look swollen or shiny. Follow these instructions at home: Medicines Take over-the-counter and prescription medicines only as told by your doctor. Your doctor may prescribe a medicine to help your body get rid of extra water (diuretic). Take this medicine if you are told to take it. Eating and drinking Eat a low-salt (low-sodium) diet as told by your doctor. Sometimes, eating less salt may reduce swelling. Depending on the cause of your swelling, you may need to limit how much fluid you drink (fluid restriction). General instructions Raise the injured area above the level of your heart while you are sitting or lying down. Do not sit still or stand for a long time. Do not wear tight clothes. Do not wear garters on your upper legs. Exercise your legs. This can help the swelling go down. Wear compression stockings as told by your doctor. It is important that these are the right size. These should be prescribed by your doctor to prevent possible injuries. If elastic bandages or wraps are recommended, use them as told by your doctor. Contact a doctor if: Treatment is not working. You have heart, liver, or kidney disease and have symptoms of edema. You have sudden and unexplained weight gain. Get help right away if: You have shortness of breath or chest pain. You cannot breathe when you lie down. You have pain, redness, or  warmth in the swollen areas. You have heart, liver, or kidney disease and get edema all of a sudden. You have a fever and your symptoms get worse all of a sudden. These symptoms may be an emergency. Get help right away. Call 911. Do not wait to see if the symptoms will go away. Do not drive yourself to the hospital. Summary Edema is when you have too much fluid in your body or under your skin. Edema may make your legs, feet, and ankles swell. Swelling often happens in looser tissues, such as around your eyes. Raise the injured area above the level of your heart while you are sitting or lying down. Follow your doctor's instructions about diet and how much fluid you can drink. This information is not intended to replace advice given to you by your health care provider. Make sure you discuss any questions you have with your health care provider. Document Revised: 03/23/2021 Document Reviewed: 03/23/2021 Elsevier Patient Education  2024 [employer].

## 2024-01-03 NOTE — Therapy (Signed)
 OUTPATIENT PHYSICAL THERAPY SHOULDER TREATMENT  Patient Name: Marissa Perez MRN: [id number] DOB:June 07, 1946, 78 y.o., female Today's Date: 01/03/2024  END OF SESSION:  PT End of Session - 01/03/24 1218     Visit Number 3    Number of Visits 12    Date for PT Re-Evaluation 02/09/24    PT Start Time 1105    PT Stop Time 1200    PT Time Calculation (min) 55 min    Activity Tolerance Patient tolerated treatment well    Behavior During Therapy [facility] for tasks assessed/performed             Past Medical History:  Diagnosis Date   Acid reflux    Allergy    Arthritis    Diverticulosis    GERD (gastroesophageal reflux disease)    Heart murmur    as a child only   History of ETT 01/2009   abnormal    History of stomach ulcers 2008   positive h. pylori   Hyperlipidemia    Hypertension    Hypothyroidism    IBS (irritable bowel syndrome)    Interstitial cystitis    Skin cancer    basal and squamous   Past Surgical History:  Procedure Laterality Date   CESAREAN SECTION  08/02/1970   EYE SURGERY Bilateral 10/01/2023   cataract extraction w/ IOL   MOHS SURGERY  2005   scalp   TONSILLECTOMY  08/02/1949   TOTAL ABDOMINAL HYSTERECTOMY W/ BILATERAL SALPINGOOPHORECTOMY  08/02/1988   Dr. Felipe Horton / fibroids    TOTAL HIP ARTHROPLASTY Right 11/16/2023   Procedure: ARTHROPLASTY, HIP, TOTAL, ANTERIOR APPROACH;  Surgeon: Liliane Rei, MD;  Location: [facility];  Service: Orthopedics;  Laterality: Right;   Patient Active Problem List   Diagnosis Date Noted   OA (osteoarthritis) of hip 11/16/2023   Primary osteoarthritis of right hip 11/16/2023   Osteoarthritis of first carpometacarpal joint of left hand 01/15/2022   Osteoarthritis of first carpometacarpal joint of right hand 01/15/2022   Pain in right knee 08/22/2019   Osteoarthritis of multiple joints 01/25/2019   Lumbar spondylosis 12/28/2017   Degeneration of lumbar intervertebral disc 12/13/2017   Aortic atherosclerosis (HCC)  09/22/2017   Family history of breast cancer 07/05/2016   Skin cancer 07/05/2016   Vitamin D  deficiency 07/05/2016   Hypothyroidism 12/11/2012   Hyperlipidemia 12/11/2012   Hypertension 12/11/2012   GERD (gastroesophageal reflux disease) 12/11/2012   REFERRING PROVIDER: Carter Clare MD  REFERRING DIAG: Subacromial bursitis of right shoulder.  THERAPY DIAG:  Acute pain of right shoulder  Stiffness of right shoulder, not elsewhere classified  Muscle weakness (generalized)  Rationale for Evaluation and Treatment: Rehabilitation  ONSET DATE: March, 2025.  SUBJECTIVE:  SUBJECTIVE STATEMENT: Sore from last treatment.  [place] be at beach for a [place].    PERTINENT HISTORY: See above.    PAIN:  Are you having pain? Yes: NPRS scale: 5-6/10. Pain location: Right shoulder. Pain description: As above. Aggravating factors: As above. Relieving factors: As above.    PRECAUTIONS: None  RED FLAGS: None   WEIGHT BEARING RESTRICTIONS: No  FALLS:  Has patient fallen in last 6 months? No  LIVING ENVIRONMENT: Lives with: lives with their spouse Lives in: House/apartment Has following equipment at home: Hurry cane.  OCCUPATION: Retired.  PLOF: Independent  PATIENT GOALS:  Use right UE without pain.  NEXT MD VISIT:   OBJECTIVE:  Note: Objective measures were completed at Evaluation unless otherwise noted.  PATIENT SURVEYS:  Quick Dash 68.18   UPPER EXTREMITY ROM:   Active right shoulder antigravity flexion to 95 degrees and painful and passive to 115 degrees.  ER actively to 45 degrees and passive to 70 degrees and behind back motion to lower lumbar region.  UPPER EXTREMITY MMT:  IR/ER strength tested with elbow by side is 4 to 4+/5.  Deltoid strength decreased to 3-/5 which appears decreased  at least in part due to pain.   SHOULDER SPECIAL TESTS: (-) right Drop Arm test.  Some pain reproduction with impingement testing.    PALPATION:  Tender to palpation at right acromial ridge and middle deltoid and right UT.                                                                                                                               TREATMENT DATE:  01/03/24:  Combo e'stim/US  at 1.50 W/CM2 x 12 minutes f/b STW/M with ischemic release technique x 11 minutes f/b IFC at 80-150 Hz on 40% scan x 20 minutes to patient's right shoulder. Normal modality response following removal of modality.   01/02/24:  Combo e'stim/US  at 1.50 W/CM2 x 12 minutes f/b STW/M with ischemic release technique x 11 minutes f/b IFC at 80-150 Hz on 40% scan x 20 minutes to patient's right shoulder. Normal modality response following removal of modality.     12/29/23:   IFC at 80-150 Hz on 40% scan x 20 minutes to patient's right shoulder f/b STW/M x 8 minutes with ischemic release technique to right middle deltoid.  Normal modality response following removal of modality.     PATIENT EDUCATION: Education details: See below. Person educated: Patient Education method: Medical illustrator Education comprehension: verbalized understanding  HOME EXERCISE PROGRAM: HOME EXERCISE PROGRAM [7CSZXKN]  Shoulder Flexion Wall Walking Exercise -  Repeat 10 Repetitions, Hold 10 Seconds, Complete 2 Sets, Perform 3 Times a Day  ASSESSMENT:  CLINICAL IMPRESSION:  Good response to treatment today with active right shoulder flexion to 145 degrees after treatment.     OBJECTIVE IMPAIRMENTS: decreased activity tolerance, decreased ROM, decreased strength, increased muscle spasms, and pain.   ACTIVITY LIMITATIONS: carrying, lifting, and reach over head  PARTICIPATION LIMITATIONS: meal prep, cleaning, laundry, and yard work  [facility] POTENTIAL: Excellent  CLINICAL DECISION MAKING:  Stable/uncomplicated  EVALUATION COMPLEXITY: Low   GOALS:  SHORT TERM GOALS: Target date: 01/12/24  Ind with a initial HEP. Goal status: INITIAL   LONG TERM GOALS: Target date: 02/09/24  Ind with an advanced HEP.  Goal status: INITIAL  2.  Active right shoulder flexion to 145 degrees so the patient can easily reach overhead.  Goal status: INITIAL  3.  Increase right shoulder strength to a solid 4+/5 to increase stability for performance of functional activities.  Goal status: INITIAL  4.  Perform ADL's with pain not > 3/10.  Goal status: INITIAL   PLAN:  PT FREQUENCY: 2x/week  PT DURATION: 6 weeks  PLANNED INTERVENTIONS: 97110-Therapeutic exercises, 97530- Therapeutic activity, [id number]- Neuromuscular re-education, 97535- Self Care, 27253- Manual therapy, G0283- Electrical stimulation (unattended), 97016- Vasopneumatic device, 97035- Ultrasound, Patient/Family education, Cryotherapy, and Moist heat  PLAN FOR NEXT SESSION: Combo e'stim/US , STW/M, UE Ranger, wall ladder.  GU4.   Jeremi Losito, [place], PT 01/03/2024, 12:23 PM

## 2024-01-03 NOTE — Progress Notes (Signed)
 Subjective: CC: Leg edema PCP: Marissa Guerin, DO Marissa Perez is a 78 y.o. female presenting to clinic today for:  1.  Leg edema Patient reports that she is been having intermittent ankle and feet edema for the last couple of weeks.  It seemed to start after she had visited one of her friends.  She notes that she sat down with her legs down all day.  She does have a history of recent surgery but given bilateral nature her orthopedist did not feel like this was related to surgery.  She had been utilizing some meloxicam  15 mg but stopped it Friday because she read that it could contribute to swelling.  She has never had this issue with use of her gabapentin  or amlodipine .  Her swelling is much better today but still not at her baseline.  Denies any chest pain, shortness of breath.  No increased consumption of salt.  She does not use compression hose.   ROS: Per HPI  Allergies  Allergen Reactions   Codeine     Headache     Femring [Estradiol] Rash   Cephalexin Rash   Erythromycin Other (See Comments)    Gi  Upset     Penicillins Rash   Past Medical History:  Diagnosis Date   Acid reflux    Allergy    Arthritis    Diverticulosis    GERD (gastroesophageal reflux disease)    Heart murmur    as a child only   History of ETT 01/2009   abnormal    History of stomach ulcers 2008   positive h. pylori   Hyperlipidemia    Hypertension    Hypothyroidism    IBS (irritable bowel syndrome)    Interstitial cystitis    Skin cancer    basal and squamous    Current Outpatient Medications:    amLODipine  (NORVASC ) 5 MG tablet, Take 1 tablet (5 mg total) by mouth daily. With losartan  for BP, Disp: 90 tablet, Rfl: 3   atorvastatin  (LIPITOR) 20 MG tablet, Take 1 tablet (20 mg total) by mouth daily. (Patient taking differently: Take 20 mg by mouth every evening.), Disp: 90 tablet, Rfl: 3   Calcium -Magnesium  (CAL/MAG PO), Take 1 capsule by mouth in the morning and at bedtime.   Calcium  & Magnesium  Mineral Complex 750 mg, Disp: , Rfl:    carboxymethylcellulose (REFRESH PLUS) 0.5 % SOLN, 1-2 drops 3 (three) times daily as needed (dry/irritated eyes.)., Disp: , Rfl:    Cholecalciferol (VITAMIN D -3) 125 MCG (5000 UT) TABS, Take 5,000 Units by mouth in the morning., Disp: , Rfl:    clobetasol (TEMOVATE) 0.05 % external solution, Apply 1 application  topically daily as needed (scalp irritation)., Disp: , Rfl:    cyanocobalamin (VITAMIN B12) 1000 MCG tablet, Take 10,000 mcg by mouth in the morning., Disp: , Rfl:    diclofenac  Sodium (VOLTAREN ) 1 % GEL, Apply 4 g topically 4 (four) times daily as needed (pain.)., Disp: , Rfl:    ezetimibe  (ZETIA ) 10 MG tablet, Take 1 tablet (10 mg total) by mouth daily. (Patient taking differently: Take 10 mg by mouth every evening.), Disp: 90 tablet, Rfl: 3   gabapentin  (NEURONTIN ) 100 MG capsule, TAKE 1 CAPSULE EVERY MORNING AND 2 CAPSULES AT BEDTIME, Disp: 270 capsule, Rfl: 1   Glucosamine-Chondroitin (COSAMIN DS PO), Take 1 tablet by mouth in the morning and at bedtime., Disp: , Rfl:    HYDROmorphone  (DILAUDID ) 2 MG tablet, Take 0.5-1 tablets (1-2 mg total)  by mouth every 6 (six) hours as needed for severe pain (pain score 7-10)., Disp: 21 tablet, Rfl: 0   icosapent  Ethyl (VASCEPA ) 1 g capsule, Take 2 capsules (2 g total) by mouth 2 (two) times daily., Disp: 360 capsule, Rfl: 3   levothyroxine  (SYNTHROID ) 50 MCG tablet, Take 1/2 tablet on Mon, Wed, Fri and 1 tablet daily Tues and Thursday, Disp: 90 tablet, Rfl: 3   losartan  (COZAAR ) 100 MG tablet, Take 1 tablet (100 mg total) by mouth daily., Disp: 90 tablet, Rfl: 3   methocarbamol  (ROBAXIN ) 500 MG tablet, Take 1 tablet (500 mg total) by mouth every 6 (six) hours as needed for muscle spasms., Disp: 40 tablet, Rfl: 0   omeprazole  (PRILOSEC) 20 MG capsule, Take 1 capsule (20 mg total) by mouth 2 (two) times daily as needed. (Patient taking differently: Take 20 mg by mouth in the morning and at  bedtime.), Disp: 180 capsule, Rfl: 3   ondansetron  (ZOFRAN ) 4 MG tablet, Take 1 tablet (4 mg total) by mouth every 6 (six) hours as needed for nausea., Disp: 20 tablet, Rfl: 0   Probiotic Product (PROBIOTIC DAILY PO), Take 1 capsule by mouth at bedtime., Disp: , Rfl:    traMADol  (ULTRAM ) 50 MG tablet, Take 1-2 tablets (50-100 mg total) by mouth every 6 (six) hours as needed for moderate pain (pain score 4-6)., Disp: 40 tablet, Rfl: 0   ZINC GLUCONATE PO, Take 50 mg by mouth in the morning., Disp: , Rfl:  Social History   Socioeconomic History   Marital status: Married    Spouse name: Marissa Perez"   Number of children: 1   Years of education: 16   Highest education level: Bachelor's degree (e.g., BA, AB, BS)  Occupational History   Occupation: Retired    Associate Professor: Tour manager    Comment: Teacher  Tobacco Use   Smoking status: Never   Smokeless tobacco: Never  Vaping Use   Vaping status: Never Used  Substance and Sexual Activity   Alcohol use: Yes    Alcohol/week: 7.0 standard drinks of alcohol    Types: 7 Glasses of wine per week    Comment: 1 daily   Drug use: No   Sexual activity: Never  Other Topics Concern   Not on file  Social History Narrative   Lives home with her husband "Marissa Perez" Their son lives in [place]. One grandchild   Social Drivers of Corporate investment banker Strain: Low Risk  (08/17/2023)   Overall Financial Resource Strain (CARDIA)    Difficulty of Paying Living Expenses: Not hard at all  Food Insecurity: Patient Declined (11/16/2023)   Hunger Vital Sign    Worried About Running Out of Food in the Last Year: Patient declined    Ran Out of Food in the Last Year: Patient declined  Transportation Needs: Patient Declined (11/16/2023)   PRAPARE - Administrator, Civil Service (Medical): Patient declined    Lack of Transportation (Non-Medical): Patient declined  Physical Activity: Sufficiently Active (08/17/2023)   Exercise Vital Sign     Days of Exercise per Week: 6 days    Minutes of Exercise per Session: 30 min  Stress: No Stress Concern Present (08/17/2023)   [employer] of Occupational Health - Occupational Stress Questionnaire    Feeling of Stress : Not at all  Social Connections: Patient Declined (11/16/2023)   Social Connection and Isolation Panel [NHANES]    Frequency of Communication with Friends and Family: Patient declined  Frequency of Social Gatherings with Friends and Family: Patient declined    Attends Religious Services: Patient declined    Active Member of Clubs or Organizations: Patient declined    Attends Banker Meetings: Patient declined    Marital Status: Patient declined  Intimate Partner Violence: Patient Declined (11/16/2023)   Humiliation, Afraid, Rape, and Kick questionnaire    Fear of Current or Ex-Partner: Patient declined    Emotionally Abused: Patient declined    Physically Abused: Patient declined    Sexually Abused: Patient declined   Family History  Problem Relation Age of Onset   Diabetes Father    Heart disease Father    Heart attack Father 75   Breast cancer Mother    Breast cancer Paternal Grandmother        Aunt also    Breast cancer Other        Family History   Breast cancer Maternal Aunt    Breast cancer Paternal Aunt    Colon cancer Neg Hx    Esophageal cancer Neg Hx    Rectal cancer Neg Hx    Stomach cancer Neg Hx     Objective: Office vital signs reviewed. BP 114/63   Pulse 68   Temp 98.5 F (36.9 C)   Ht 5\' 1"  (1.549 m)   Wt 141 lb 3.2 oz (64 kg)   SpO2 99%   BMI 26.68 kg/m   Physical Examination:  General: Awake, alert, well nourished, No acute distress HEENT: Sclera white.  No exophthalmos. no goiter. Cardio: regular rate and rhythm, S1S2 heard, no murmurs appreciated Pulm: clear to auscultation bilaterally, no wheezes, rhonchi or rales; normal work of breathing on room air Extremities: Mild to moderate nonpitting edema to  bilateral feet.  +2 pulses bilaterally  Assessment/ Plan: 78 y.o. female   Bilateral leg edema - Plan: CBC, TSH + free T4, CMP14+EGFR, Brain natriuretic peptide  Mild nonpitting edema to the feet bilaterally present.  Pulses were good on exam.  Will check for metabolic etiology but clinically euvolemic otherwise.  Suspect this is likely venous stasis in the setting of recent anatomic manipulation after surgery and I have encouraged her to keep legs elevated and consider compression hose while traveling tomorrow   Marissa Guerin, DO [facility] [phone]

## 2024-01-04 ENCOUNTER — Ambulatory Visit (HOSPITAL_COMMUNITY): Admit: 2024-01-04 | Admitting: Orthopedic Surgery

## 2024-01-04 LAB — BRAIN NATRIURETIC PEPTIDE: BNP: 28.5 pg/mL (ref 0.0–100.0)

## 2024-01-04 LAB — SPECIMEN STATUS REPORT

## 2024-01-04 SURGERY — ARTHROPLASTY, HIP, TOTAL, ANTERIOR APPROACH
Anesthesia: Choice | Site: Hip | Laterality: Right

## 2024-01-09 ENCOUNTER — Ambulatory Visit: Payer: Self-pay | Admitting: Family Medicine

## 2024-01-09 LAB — CMP14+EGFR
ALT: 40 IU/L — ABNORMAL HIGH (ref 0–32)
AST: 29 IU/L (ref 0–40)
Albumin: 4.6 g/dL (ref 3.8–4.8)
Alkaline Phosphatase: 106 IU/L (ref 44–121)
BUN/Creatinine Ratio: 22 (ref 12–28)
BUN: 13 mg/dL (ref 8–27)
Bilirubin Total: 0.2 mg/dL (ref 0.0–1.2)
CO2: 25 mmol/L (ref 20–29)
Calcium: 9.3 mg/dL (ref 8.7–10.3)
Chloride: 99 mmol/L (ref 96–106)
Creatinine, Ser: 0.6 mg/dL (ref 0.57–1.00)
Globulin, Total: 2.4 g/dL (ref 1.5–4.5)
Glucose: 97 mg/dL (ref 70–99)
Potassium: 5 mmol/L (ref 3.5–5.2)
Sodium: 136 mmol/L (ref 134–144)
Total Protein: 7 g/dL (ref 6.0–8.5)
eGFR: 92 mL/min/{1.73_m2} (ref >59)

## 2024-01-09 LAB — CBC
Hematocrit: 34 % (ref 34.0–46.6)
Hemoglobin: 11.2 g/dL (ref 11.1–15.9)
MCH: 31.7 pg (ref 26.6–33.0)
MCHC: 32.9 g/dL (ref 31.5–35.7)
MCV: 96 fL (ref 79–97)
Platelets: 332 10*3/uL (ref 150–450)
RBC: 3.53 x10E6/uL — ABNORMAL LOW (ref 3.77–5.28)
RDW: 12.8 % (ref 11.7–15.4)
WBC: 5.3 10*3/uL (ref 3.4–10.8)

## 2024-01-09 LAB — BRAIN NATRIURETIC PEPTIDE

## 2024-01-09 LAB — TSH+FREE T4
Free T4: 1.14 ng/dL (ref 0.82–1.77)
TSH: 2.62 u[IU]/mL (ref 0.450–4.500)

## 2024-01-16 ENCOUNTER — Ambulatory Visit: Admitting: Physical Therapy

## 2024-01-16 DIAGNOSIS — M25611 Stiffness of right shoulder, not elsewhere classified: Secondary | ICD-10-CM

## 2024-01-16 DIAGNOSIS — M25511 Pain in right shoulder: Secondary | ICD-10-CM | POA: Diagnosis not present

## 2024-01-16 DIAGNOSIS — M6281 Muscle weakness (generalized): Secondary | ICD-10-CM | POA: Diagnosis not present

## 2024-01-16 NOTE — Therapy (Signed)
 OUTPATIENT PHYSICAL THERAPY SHOULDER TREATMENT  Patient Name: Marissa Perez MRN: [id number] DOB:Jul 08, 1946, 78 y.o., female Today's Date: 01/16/2024  END OF SESSION:  PT End of Session - 01/16/24 1010     Visit Number 4    Number of Visits 12    Date for PT Re-Evaluation 02/09/24    Authorization Type FOTO AT LEAST EVERY 5TH VISIT.  PROGRESS NOTE AT 10TH VISIT.  KX MODIFIER AFTER 15 VISITS.    PT Start Time [phone]    PT Stop Time 1022    PT Time Calculation (min) 49 min    Activity Tolerance Patient tolerated treatment well    Behavior During Therapy WFL for tasks assessed/performed          Past Medical History:  Diagnosis Date   Acid reflux    Allergy    Arthritis    Diverticulosis    GERD (gastroesophageal reflux disease)    Heart murmur    as a child only   History of ETT 01/2009   abnormal    History of stomach ulcers 2008   positive h. pylori   Hyperlipidemia    Hypertension    Hypothyroidism    IBS (irritable bowel syndrome)    Interstitial cystitis    Skin cancer    basal and squamous   Past Surgical History:  Procedure Laterality Date   CESAREAN SECTION  08/02/1970   EYE SURGERY Bilateral 10/01/2023   cataract extraction w/ IOL   MOHS SURGERY  2005   scalp   TONSILLECTOMY  08/02/1949   TOTAL ABDOMINAL HYSTERECTOMY W/ BILATERAL SALPINGOOPHORECTOMY  08/02/1988   Dr. Felipe Horton / fibroids    TOTAL HIP ARTHROPLASTY Right 11/16/2023   Procedure: ARTHROPLASTY, HIP, TOTAL, ANTERIOR APPROACH;  Surgeon: Liliane Rei, MD;  Location: [facility];  Service: Orthopedics;  Laterality: Right;   Patient Active Problem List   Diagnosis Date Noted   OA (osteoarthritis) of hip 11/16/2023   Primary osteoarthritis of right hip 11/16/2023   Osteoarthritis of first carpometacarpal joint of left hand 01/15/2022   Osteoarthritis of first carpometacarpal joint of right hand 01/15/2022   Pain in right knee 08/22/2019   Osteoarthritis of multiple joints 01/25/2019   Lumbar  spondylosis 12/28/2017   Degeneration of lumbar intervertebral disc 12/13/2017   Aortic atherosclerosis (HCC) 09/22/2017   Family history of breast cancer 07/05/2016   Skin cancer 07/05/2016   Vitamin D  deficiency 07/05/2016   Hypothyroidism 12/11/2012   Hyperlipidemia 12/11/2012   Hypertension 12/11/2012   GERD (gastroesophageal reflux disease) 12/11/2012   REFERRING PROVIDER: Carter Clare MD  REFERRING DIAG: Subacromial bursitis of right shoulder.  THERAPY DIAG:  Acute pain of right shoulder  Stiffness of right shoulder, not elsewhere classified  Muscle weakness (generalized)  Rationale for Evaluation and Treatment: Rehabilitation  ONSET DATE: March, 2025.  SUBJECTIVE:  SUBJECTIVE STATEMENT: Shoulder pain around a 1-2/10. PERTINENT HISTORY: See above.    PAIN:  Are you having pain? Yes: NPRS scale: 1-2/10. Pain location: Right shoulder. Pain description: As above. Aggravating factors: As above. Relieving factors: As above.    PRECAUTIONS: None  RED FLAGS: None   WEIGHT BEARING RESTRICTIONS: No  FALLS:  Has patient fallen in last 6 months? No  LIVING ENVIRONMENT: Lives with: lives with their spouse Lives in: House/apartment Has following equipment at home: Hurry cane.  OCCUPATION: Retired.  PLOF: Independent  PATIENT GOALS:  Use right UE without pain.  NEXT MD VISIT:   OBJECTIVE:  Note: Objective measures were completed at Evaluation unless otherwise noted.  PATIENT SURVEYS:  Quick Dash 68.18   UPPER EXTREMITY ROM:   Active right shoulder antigravity flexion to 95 degrees and painful and passive to 115 degrees.  ER actively to 45 degrees and passive to 70 degrees and behind back motion to lower lumbar region.  UPPER EXTREMITY MMT:  IR/ER strength tested with  elbow by side is 4 to 4+/5.  Deltoid strength decreased to 3-/5 which appears decreased at least in part due to pain.   SHOULDER SPECIAL TESTS: (-) right Drop Arm test.  Some pain reproduction with impingement testing.    PALPATION:  Tender to palpation at right acromial ridge and middle deltoid and right UT.                                                                                                                               TREATMENT DATE:  UBE x 8 minutes f/b Combo e'stim/US  at 1.50 W/CM2 x 7 minutes f/b STW/M with ischemic release technique x 9 minutes f/b IFC at 80-150 Hz on 40% scan x 20 minutes to patient's right shoulder. Normal modality response following removal of modality.  01/03/24:  Combo e'stim/US  at 1.50 W/CM2 x 12 minutes f/b STW/M with ischemic release technique x 11 minutes f/b IFC at 80-150 Hz on 40% scan x 20 minutes to patient's right shoulder. Normal modality response following removal of modality.    PATIENT EDUCATION: Education details: See below. Person educated: Patient Education method: Medical illustrator Education comprehension: verbalized understanding  HOME EXERCISE PROGRAM: HOME EXERCISE PROGRAM [7CSZXKN]  Shoulder Flexion Wall Walking Exercise -  Repeat 10 Repetitions, Hold 10 Seconds, Complete 2 Sets, Perform 3 Times a Day  ASSESSMENT:  CLINICAL IMPRESSION:  Patient improving with less pain and improved right shoulder AROM.   OBJECTIVE IMPAIRMENTS: decreased activity tolerance, decreased ROM, decreased strength, increased muscle spasms, and pain.   ACTIVITY LIMITATIONS: carrying, lifting, and reach over head  PARTICIPATION LIMITATIONS: meal prep, cleaning, laundry, and yard work  [facility] POTENTIAL: Excellent  CLINICAL DECISION MAKING: Stable/uncomplicated  EVALUATION COMPLEXITY: Low   GOALS:  SHORT TERM GOALS: Target date: 01/12/24  Ind with a initial HEP. Goal status: INITIAL   LONG TERM GOALS: Target date:  02/09/24  Ind with an advanced HEP.  Goal status: INITIAL  2.  Active right shoulder flexion to 145 degrees so the patient can easily reach overhead.  Goal status: INITIAL  3.  Increase right shoulder strength to a solid 4+/5 to increase stability for performance of functional activities.  Goal status: INITIAL  4.  Perform ADL's with pain not > 3/10.  Goal status: INITIAL   PLAN:  PT FREQUENCY: 2x/week  PT DURATION: 6 weeks  PLANNED INTERVENTIONS: 97110-Therapeutic exercises, 97530- Therapeutic activity, [id number]- Neuromuscular re-education, 97535- Self Care, 96295- Manual therapy, G0283- Electrical stimulation (unattended), 97016- Vasopneumatic device, 97035- Ultrasound, Patient/Family education, Cryotherapy, and Moist heat  PLAN FOR NEXT SESSION: Combo e'stim/US , STW/M, UE Ranger, wall ladder.  MW4.   Django Nguyen, [place], PT 01/16/2024, 10:56 AM

## 2024-01-18 ENCOUNTER — Ambulatory Visit: Admitting: Physical Therapy

## 2024-01-18 DIAGNOSIS — M25511 Pain in right shoulder: Secondary | ICD-10-CM | POA: Diagnosis not present

## 2024-01-18 DIAGNOSIS — M6281 Muscle weakness (generalized): Secondary | ICD-10-CM

## 2024-01-18 DIAGNOSIS — M25611 Stiffness of right shoulder, not elsewhere classified: Secondary | ICD-10-CM

## 2024-01-18 NOTE — Therapy (Signed)
 OUTPATIENT PHYSICAL THERAPY SHOULDER TREATMENT  Patient Name: Marissa Perez MRN: [id number] DOB:October 01, 1945, 78 y.o., female Today's Date: 01/18/2024  END OF SESSION:  PT End of Session - 01/18/24 1610     Visit Number 5    Number of Visits 12    Date for PT Re-Evaluation 02/09/24    PT Start Time 0232    PT Stop Time 0325    PT Time Calculation (min) 53 min    Activity Tolerance Patient tolerated treatment well    Behavior During Therapy [facility] for tasks assessed/performed           Past Medical History:  Diagnosis Date   Acid reflux    Allergy    Arthritis    Diverticulosis    GERD (gastroesophageal reflux disease)    Heart murmur    as a child only   History of ETT 01/2009   abnormal    History of stomach ulcers 2008   positive h. pylori   Hyperlipidemia    Hypertension    Hypothyroidism    IBS (irritable bowel syndrome)    Interstitial cystitis    Skin cancer    basal and squamous   Past Surgical History:  Procedure Laterality Date   CESAREAN SECTION  08/02/1970   EYE SURGERY Bilateral 10/01/2023   cataract extraction w/ IOL   MOHS SURGERY  2005   scalp   TONSILLECTOMY  08/02/1949   TOTAL ABDOMINAL HYSTERECTOMY W/ BILATERAL SALPINGOOPHORECTOMY  08/02/1988   Dr. Felipe Horton / fibroids    TOTAL HIP ARTHROPLASTY Right 11/16/2023   Procedure: ARTHROPLASTY, HIP, TOTAL, ANTERIOR APPROACH;  Surgeon: Liliane Rei, MD;  Location: [facility];  Service: Orthopedics;  Laterality: Right;   Patient Active Problem List   Diagnosis Date Noted   OA (osteoarthritis) of hip 11/16/2023   Primary osteoarthritis of right hip 11/16/2023   Osteoarthritis of first carpometacarpal joint of left hand 01/15/2022   Osteoarthritis of first carpometacarpal joint of right hand 01/15/2022   Pain in right knee 08/22/2019   Osteoarthritis of multiple joints 01/25/2019   Lumbar spondylosis 12/28/2017   Degeneration of lumbar intervertebral disc 12/13/2017   Aortic atherosclerosis (HCC)  09/22/2017   Family history of breast cancer 07/05/2016   Skin cancer 07/05/2016   Vitamin D  deficiency 07/05/2016   Hypothyroidism 12/11/2012   Hyperlipidemia 12/11/2012   Hypertension 12/11/2012   GERD (gastroesophageal reflux disease) 12/11/2012   REFERRING PROVIDER: Carter Clare MD  REFERRING DIAG: Subacromial bursitis of right shoulder.  THERAPY DIAG:  Acute pain of right shoulder  Stiffness of right shoulder, not elsewhere classified  Muscle weakness (generalized)  Rationale for Evaluation and Treatment: Rehabilitation  ONSET DATE: March, 2025.  SUBJECTIVE:  SUBJECTIVE STATEMENT: Shoulder pain around a 1-2/10. PERTINENT HISTORY: See above.    PAIN:  Are you having pain? Yes: NPRS scale: 1-2/10. Pain location: Right shoulder. Pain description: As above. Aggravating factors: As above. Relieving factors: As above.    PRECAUTIONS: None  RED FLAGS: None   WEIGHT BEARING RESTRICTIONS: No  FALLS:  Has patient fallen in last 6 months? No  LIVING ENVIRONMENT: Lives with: lives with their spouse Lives in: House/apartment Has following equipment at home: Hurry cane.  OCCUPATION: Retired.  PLOF: Independent  PATIENT GOALS:  Use right UE without pain.  NEXT MD VISIT:   OBJECTIVE:  Note: Objective measures were completed at Evaluation unless otherwise noted.  PATIENT SURVEYS:  Quick Dash 68.18   UPPER EXTREMITY ROM:   Active right shoulder antigravity flexion to 95 degrees and painful and passive to 115 degrees.  ER actively to 45 degrees and passive to 70 degrees and behind back motion to lower lumbar region.  UPPER EXTREMITY MMT:  IR/ER strength tested with elbow by side is 4 to 4+/5.  Deltoid strength decreased to 3-/5 which appears decreased at least in part due to  pain.   SHOULDER SPECIAL TESTS: (-) right Drop Arm test.  Some pain reproduction with impingement testing.    PALPATION:  Tender to palpation at right acromial ridge and middle deltoid and right UT.                                                                                                                               TREATMENT DATE:  01/18/24:  UBE x 6 minutes f/b yellow theraband resisted right shoulder ER/IR to fatigue f/b Combo e'stim/US  at 1.50 W/CM2 x 7 minutes f/b STW/M with ischemic release technique x 10 minutes f/b IFC at 80-150 Hz on 40% scan x 20 minutes to patient's right shoulder. Normal modality response following removal of modality.  01/16/24:  UBE x 8 minutes f/b Combo e'stim/US  at 1.50 W/CM2 x 7 minutes f/b STW/M with ischemic release technique x 9 minutes f/b IFC at 80-150 Hz on 40% scan x 20 minutes to patient's right shoulder. Normal modality response following removal of modality.  01/03/24:  Combo e'stim/US  at 1.50 W/CM2 x 12 minutes f/b STW/M with ischemic release technique x 11 minutes f/b IFC at 80-150 Hz on 40% scan x 20 minutes to patient's right shoulder. Normal modality response following removal of modality.    PATIENT EDUCATION: Education details: See below. Person educated: Patient Education method: Medical illustrator, handout. Education comprehension: verbalized understanding  HOME EXERCISE PROGRAM: Yellow theraband resisted right shoulder ER/IR.   ASSESSMENT:  CLINICAL IMPRESSION: Patient progressing well with pain becoming consistently low.  Provided patient with yellow theraband for resisted right shoulder ER/IR.  OBJECTIVE IMPAIRMENTS: decreased activity tolerance, decreased ROM, decreased strength, increased muscle spasms, and pain.   ACTIVITY LIMITATIONS: carrying, lifting, and reach over head  PARTICIPATION LIMITATIONS: meal prep, cleaning, laundry, and yard work  REHAB POTENTIAL: Excellent  CLINICAL DECISION MAKING:  Stable/uncomplicated  EVALUATION COMPLEXITY: Low   GOALS:  SHORT TERM GOALS: Target date: 01/12/24  Ind with a initial HEP. Goal status: INITIAL   LONG TERM GOALS: Target date: 02/09/24  Ind with an advanced HEP.  Goal status: INITIAL  2.  Active right shoulder flexion to 145 degrees so the patient can easily reach overhead.  Goal status: INITIAL  3.  Increase right shoulder strength to a solid 4+/5 to increase stability for performance of functional activities.  Goal status: INITIAL  4.  Perform ADL's with pain not > 3/10.  Goal status: INITIAL   PLAN:  PT FREQUENCY: 2x/week  PT DURATION: 6 weeks  PLANNED INTERVENTIONS: 97110-Therapeutic exercises, 97530- Therapeutic activity, [id number]- Neuromuscular re-education, 97535- Self Care, [id number]- Manual therapy, [id number]- Electrical stimulation (unattended), 97016- Vasopneumatic device, 97035- Ultrasound, Patient/Family education, Cryotherapy, and Moist heat  PLAN FOR NEXT SESSION: Combo e'stim/US , STW/M, UE Ranger, wall ladder.  UX3.   Rilla Buckman, [place], PT 01/18/2024, 4:20 PM

## 2024-01-23 DIAGNOSIS — H00024 Hordeolum internum left upper eyelid: Secondary | ICD-10-CM | POA: Diagnosis not present

## 2024-01-24 ENCOUNTER — Ambulatory Visit: Admitting: Physical Therapy

## 2024-01-24 DIAGNOSIS — M25611 Stiffness of right shoulder, not elsewhere classified: Secondary | ICD-10-CM

## 2024-01-24 DIAGNOSIS — M25511 Pain in right shoulder: Secondary | ICD-10-CM

## 2024-01-24 DIAGNOSIS — M6281 Muscle weakness (generalized): Secondary | ICD-10-CM

## 2024-01-24 NOTE — Therapy (Signed)
 OUTPATIENT PHYSICAL THERAPY SHOULDER TREATMENT  Patient Name: Marissa Perez MRN: [id number] DOB:[birth year], 78 y.o., female Today's Date: 01/24/2024  END OF SESSION:  PT End of Session - 01/24/24 0934     Visit Number 6    Number of Visits 12    Date for PT Re-Evaluation 02/09/24    Authorization Type FOTO AT LEAST EVERY 5TH VISIT.  PROGRESS NOTE AT 10TH VISIT.  KX MODIFIER AFTER 15 VISITS.    PT Start Time 0927    PT Stop Time 1020    PT Time Calculation (min) 53 min    Activity Tolerance Patient tolerated treatment well    Behavior During Therapy [facility] for tasks assessed/performed           Past Medical History:  Diagnosis Date   Acid reflux    Allergy    Arthritis    Diverticulosis    GERD (gastroesophageal reflux disease)    Heart murmur    as a child only   History of ETT 01/2009   abnormal    History of stomach ulcers 2008   positive h. pylori   Hyperlipidemia    Hypertension    Hypothyroidism    IBS (irritable bowel syndrome)    Interstitial cystitis    Skin cancer    basal and squamous   Past Surgical History:  Procedure Laterality Date   CESAREAN SECTION  08/02/1970   EYE SURGERY Bilateral 10/01/2023   cataract extraction w/ IOL   MOHS SURGERY  2005   scalp   TONSILLECTOMY  08/02/1949   TOTAL ABDOMINAL HYSTERECTOMY W/ BILATERAL SALPINGOOPHORECTOMY  08/02/1988   Dr. Claudene / fibroids    TOTAL HIP ARTHROPLASTY Right 11/16/2023   Procedure: ARTHROPLASTY, HIP, TOTAL, ANTERIOR APPROACH;  Surgeon: Melodi Lerner, MD;  Location: [facility];  Service: Orthopedics;  Laterality: Right;   Patient Active Problem List   Diagnosis Date Noted   OA (osteoarthritis) of hip 11/16/2023   Primary osteoarthritis of right hip 11/16/2023   Osteoarthritis of first carpometacarpal joint of left hand 01/15/2022   Osteoarthritis of first carpometacarpal joint of right hand 01/15/2022   Pain in right knee 08/22/2019   Osteoarthritis of multiple joints 01/25/2019   Lumbar  spondylosis 12/28/2017   Degeneration of lumbar intervertebral disc 12/13/2017   Aortic atherosclerosis (HCC) 09/22/2017   Family history of breast cancer 07/05/2016   Skin cancer 07/05/2016   Vitamin D  deficiency 07/05/2016   Hypothyroidism 12/11/2012   Hyperlipidemia 12/11/2012   Hypertension 12/11/2012   GERD (gastroesophageal reflux disease) 12/11/2012   REFERRING PROVIDER: Selinda Gosling MD  REFERRING DIAG: Subacromial bursitis of right shoulder.  THERAPY DIAG:  Acute pain of right shoulder  Stiffness of right shoulder, not elsewhere classified  Muscle weakness (generalized)  Rationale for Evaluation and Treatment: Rehabilitation  ONSET DATE: March, 2025.  SUBJECTIVE:  SUBJECTIVE STATEMENT: Pretty good right now.   30 to 40% better.    PERTINENT HISTORY: See above.    PAIN:  Are you having pain? Yes: NPRS scale: 1-2/10. Pain location: Right shoulder. Pain description: As above. Aggravating factors: As above. Relieving factors: As above.    PRECAUTIONS: None  RED FLAGS: None   WEIGHT BEARING RESTRICTIONS: No  FALLS:  Has patient fallen in last 6 months? No  LIVING ENVIRONMENT: Lives with: lives with their spouse Lives in: House/apartment Has following equipment at home: Hurry cane.  OCCUPATION: Retired.  PLOF: Independent  PATIENT GOALS:  Use right UE without pain.  NEXT MD VISIT:   OBJECTIVE:  Note: Objective measures were completed at Evaluation unless otherwise noted.  PATIENT SURVEYS:  Quick Dash 68.18   UPPER EXTREMITY ROM:   Active right shoulder antigravity flexion to 95 degrees and painful and passive to 115 degrees.  ER actively to 45 degrees and passive to 70 degrees and behind back motion to lower lumbar region.  UPPER EXTREMITY MMT:  IR/ER strength  tested with elbow by side is 4 to 4+/5.  Deltoid strength decreased to 3-/5 which appears decreased at least in part due to pain.   SHOULDER SPECIAL TESTS: (-) right Drop Arm test.  Some pain reproduction with impingement testing.    PALPATION:  Tender to palpation at right acromial ridge and middle deltoid and right UT.                                                                                                                               TREATMENT DATE:  01/24/24:  UBE x 8 minutes f/b Combo e'stim/US  at 1.50 W/CM2 x 7 minutes f/b STW/M with ischemic release technique and IASTM x  8 minutes f/b IFC at 80-150 Hz on 40% scan x 20 minutes to patient's right shoulder. Normal modality response following removal of modality.   01/18/24:  UBE x 6 minutes f/b yellow theraband resisted right shoulder ER/IR to fatigue f/b Combo e'stim/US  at 1.50 W/CM2 x 7 minutes f/b STW/M with ischemic release technique x 10 minutes f/b IFC at 80-150 Hz on 40% scan x 20 minutes to patient's right shoulder. Normal modality response following removal of modality.  01/16/24:  UBE x 8 minutes f/b Combo e'stim/US  at 1.50 W/CM2 x 7 minutes f/b STW/M with ischemic release technique x 9 minutes f/b IFC at 80-150 Hz on 40% scan x 20 minutes to patient's right shoulder. Normal modality response following removal of modality.     PATIENT EDUCATION: Education details: See below. Person educated: Patient Education method: Medical illustrator, handout. Education comprehension: verbalized understanding  HOME EXERCISE PROGRAM: Yellow theraband resisted right shoulder ER/IR.   ASSESSMENT:  CLINICAL IMPRESSION: Patient progressing well with pain becoming consistently low and reporting about a 30% to 40% subjective improvement rating.   OBJECTIVE IMPAIRMENTS: decreased activity tolerance, decreased ROM, decreased strength, increased muscle spasms, and pain.   ACTIVITY  LIMITATIONS: carrying, lifting, and reach  over head  PARTICIPATION LIMITATIONS: meal prep, cleaning, laundry, and yard work  [facility] POTENTIAL: Excellent  CLINICAL DECISION MAKING: Stable/uncomplicated  EVALUATION COMPLEXITY: Low   GOALS:  SHORT TERM GOALS: Target date: 01/12/24  Ind with a initial HEP. Goal status: INITIAL   LONG TERM GOALS: Target date: 02/09/24  Ind with an advanced HEP.  Goal status: INITIAL  2.  Active right shoulder flexion to 145 degrees so the patient can easily reach overhead.  Goal status: INITIAL  3.  Increase right shoulder strength to a solid 4+/5 to increase stability for performance of functional activities.  Goal status: INITIAL  4.  Perform ADL's with pain not > 3/10.  Goal status: INITIAL   PLAN:  PT FREQUENCY: 2x/week  PT DURATION: 6 weeks  PLANNED INTERVENTIONS: 97110-Therapeutic exercises, 97530- Therapeutic activity, [id number]- Neuromuscular re-education, 97535- Self Care, 02859- Manual therapy, G0283- Electrical stimulation (unattended), 97016- Vasopneumatic device, 97035- Ultrasound, Patient/Family education, Cryotherapy, and Moist heat  PLAN FOR NEXT SESSION: Combo e'stim/US , STW/M, UE Ranger, wall ladder.  MT5.   Shiza Thelen, [place], PT 01/24/2024, 10:28 AM

## 2024-01-26 ENCOUNTER — Ambulatory Visit: Admitting: Physical Therapy

## 2024-01-26 DIAGNOSIS — M25611 Stiffness of right shoulder, not elsewhere classified: Secondary | ICD-10-CM | POA: Diagnosis not present

## 2024-01-26 DIAGNOSIS — M25511 Pain in right shoulder: Secondary | ICD-10-CM | POA: Diagnosis not present

## 2024-01-26 DIAGNOSIS — M6281 Muscle weakness (generalized): Secondary | ICD-10-CM | POA: Diagnosis not present

## 2024-01-26 NOTE — Therapy (Signed)
 OUTPATIENT PHYSICAL THERAPY SHOULDER TREATMENT  Patient Name: ASTOU LADA MRN: [id number] DOB:January 19, 1946, 78 y.o., female Today's Date: 01/26/2024  END OF SESSION:  PT End of Session - 01/26/24 1102     Visit Number 7    Number of Visits 12    Date for PT Re-Evaluation 02/09/24    Authorization Type FOTO AT LEAST EVERY 5TH VISIT.  PROGRESS NOTE AT 10TH VISIT.  KX MODIFIER AFTER 15 VISITS.    PT Start Time 1018    PT Stop Time 1112    PT Time Calculation (min) 54 min    Activity Tolerance Patient tolerated treatment well    Behavior During Therapy [facility] for tasks assessed/performed           Past Medical History:  Diagnosis Date   Acid reflux    Allergy    Arthritis    Diverticulosis    GERD (gastroesophageal reflux disease)    Heart murmur    as a child only   History of ETT 01/2009   abnormal    History of stomach ulcers 2008   positive h. pylori   Hyperlipidemia    Hypertension    Hypothyroidism    IBS (irritable bowel syndrome)    Interstitial cystitis    Skin cancer    basal and squamous   Past Surgical History:  Procedure Laterality Date   CESAREAN SECTION  08/02/1970   EYE SURGERY Bilateral 10/01/2023   cataract extraction w/ IOL   MOHS SURGERY  2005   scalp   TONSILLECTOMY  08/02/1949   TOTAL ABDOMINAL HYSTERECTOMY W/ BILATERAL SALPINGOOPHORECTOMY  08/02/1988   Dr. Claudene / fibroids    TOTAL HIP ARTHROPLASTY Right 11/16/2023   Procedure: ARTHROPLASTY, HIP, TOTAL, ANTERIOR APPROACH;  Surgeon: Melodi Lerner, MD;  Location: [facility];  Service: Orthopedics;  Laterality: Right;   Patient Active Problem List   Diagnosis Date Noted   OA (osteoarthritis) of hip 11/16/2023   Primary osteoarthritis of right hip 11/16/2023   Osteoarthritis of first carpometacarpal joint of left hand 01/15/2022   Osteoarthritis of first carpometacarpal joint of right hand 01/15/2022   Pain in right knee 08/22/2019   Osteoarthritis of multiple joints 01/25/2019   Lumbar  spondylosis 12/28/2017   Degeneration of lumbar intervertebral disc 12/13/2017   Aortic atherosclerosis (HCC) 09/22/2017   Family history of breast cancer 07/05/2016   Skin cancer 07/05/2016   Vitamin D  deficiency 07/05/2016   Hypothyroidism 12/11/2012   Hyperlipidemia 12/11/2012   Hypertension 12/11/2012   GERD (gastroesophageal reflux disease) 12/11/2012   REFERRING PROVIDER: Selinda Gosling MD  REFERRING DIAG: Subacromial bursitis of right shoulder.  THERAPY DIAG:  Acute pain of right shoulder  Stiffness of right shoulder, not elsewhere classified  Muscle weakness (generalized)  Rationale for Evaluation and Treatment: Rehabilitation  ONSET DATE: March, 2025.  SUBJECTIVE:  SUBJECTIVE STATEMENT: Pain at a 2.  PERTINENT HISTORY: See above.    PAIN:  Are you having pain? Yes: NPRS scale: 2/10. Pain location: Right shoulder. Pain description: As above. Aggravating factors: As above. Relieving factors: As above.    PRECAUTIONS: None  RED FLAGS: None   WEIGHT BEARING RESTRICTIONS: No  FALLS:  Has patient fallen in last 6 months? No  LIVING ENVIRONMENT: Lives with: lives with their spouse Lives in: House/apartment Has following equipment at home: Hurry cane.  OCCUPATION: Retired.  PLOF: Independent  PATIENT GOALS:  Use right UE without pain.  NEXT MD VISIT:   OBJECTIVE:  Note: Objective measures were completed at Evaluation unless otherwise noted.  PATIENT SURVEYS:  Quick Dash 68.18   UPPER EXTREMITY ROM:   Active right shoulder antigravity flexion to 95 degrees and painful and passive to 115 degrees.  ER actively to 45 degrees and passive to 70 degrees and behind back motion to lower lumbar region.  01/26/24:  Right active antigravity flexion to 155 degrees.    UPPER  EXTREMITY MMT:  IR/ER strength tested with elbow by side is 4 to 4+/5.  Deltoid strength decreased to 3-/5 which appears decreased at least in part due to pain.   SHOULDER SPECIAL TESTS: (-) right Drop Arm test.  Some pain reproduction with impingement testing.    PALPATION:  Tender to palpation at right acromial ridge and middle deltoid and right UT.                                                                                                                               TREATMENT DATE:  01/26/24:  UBE x 8 minutes f/b UE Ranger on wall x 5 minutes Combo e'stim/US  at 1.50 W/CM2 x 7 minutes f/b STW/M x 7 minutes  f/b IFC at 80-150 Hz on 40% scan x 15 minutes to patient's right shoulder. Normal modality response following removal of modality.  01/24/24:  UBE x 8 minutes f/b Combo e'stim/US  at 1.50 W/CM2 x 7 minutes f/b STW/M with ischemic release technique and IASTM x  8 minutes f/b IFC at 80-150 Hz on 40% scan x 20 minutes to patient's right shoulder. Normal modality response following removal of modality.   PATIENT EDUCATION: Education details: See below. Person educated: Patient Education method: Medical illustrator, handout. Education comprehension: verbalized understanding  HOME EXERCISE PROGRAM: Yellow theraband resisted right shoulder ER/IR.   ASSESSMENT:  CLINICAL IMPRESSION:Patient progressing well and achieved active right shoulder flexion to 155 degrees today.    OBJECTIVE IMPAIRMENTS: decreased activity tolerance, decreased ROM, decreased strength, increased muscle spasms, and pain.   ACTIVITY LIMITATIONS: carrying, lifting, and reach over head  PARTICIPATION LIMITATIONS: meal prep, cleaning, laundry, and yard work  [facility] POTENTIAL: Excellent  CLINICAL DECISION MAKING: Stable/uncomplicated  EVALUATION COMPLEXITY: Low   GOALS:  SHORT TERM GOALS: Target date: 01/12/24  Ind with a initial HEP. Goal status: MET.  LONG TERM GOALS: Target date:  02/09/24  Ind with an advanced HEP.  Goal status: Ongoing.  2.  Active right shoulder flexion to 145 degrees so the patient can easily reach overhead.  Goal status: 155 degrees (01/26/24).  3.  Increase right shoulder strength to a solid 4+/5 to increase stability for performance of functional activities.  Goal status: Ongoing.  4.  Perform ADL's with pain not > 3/10.  Goal status: Ongoing.   PLAN:  PT FREQUENCY: 2x/week  PT DURATION: 6 weeks  PLANNED INTERVENTIONS: 97110-Therapeutic exercises, 97530- Therapeutic activity, [id number]- Neuromuscular re-education, 97535- Self Care, 02859- Manual therapy, [id number]- Electrical stimulation (unattended), 97016- Vasopneumatic device, 97035- Ultrasound, Patient/Family education, Cryotherapy, and Moist heat  PLAN FOR NEXT SESSION: Combo e'stim/US , STW/M, UE Ranger, wall ladder.  MT5.   Haadiya Frogge, [place], PT 01/26/2024, 11:15 AM

## 2024-01-30 ENCOUNTER — Ambulatory Visit: Admitting: Physical Therapy

## 2024-01-30 ENCOUNTER — Encounter: Payer: Self-pay | Admitting: Physical Therapy

## 2024-01-30 DIAGNOSIS — M25611 Stiffness of right shoulder, not elsewhere classified: Secondary | ICD-10-CM

## 2024-01-30 DIAGNOSIS — M6281 Muscle weakness (generalized): Secondary | ICD-10-CM | POA: Diagnosis not present

## 2024-01-30 DIAGNOSIS — M25511 Pain in right shoulder: Secondary | ICD-10-CM | POA: Diagnosis not present

## 2024-01-30 NOTE — Therapy (Signed)
 OUTPATIENT PHYSICAL THERAPY SHOULDER TREATMENT  Patient Name: Marissa Perez MRN: [id number] DOB:[birth year], 78 y.o., female Today's Date: 01/30/2024  END OF SESSION:  PT End of Session - 01/30/24 1115     Visit Number 8    Number of Visits 12    Date for PT Re-Evaluation 02/09/24    Authorization Type FOTO AT LEAST EVERY 5TH VISIT.  PROGRESS NOTE AT 10TH VISIT.  KX MODIFIER AFTER 15 VISITS.    PT Start Time 1015    PT Stop Time 1109    PT Time Calculation (min) 54 min    Activity Tolerance Patient tolerated treatment well    Behavior During Therapy WFL for tasks assessed/performed           Past Medical History:  Diagnosis Date   Acid reflux    Allergy    Arthritis    Diverticulosis    GERD (gastroesophageal reflux disease)    Heart murmur    as a child only   History of ETT 01/2009   abnormal    History of stomach ulcers 2008   positive h. pylori   Hyperlipidemia    Hypertension    Hypothyroidism    IBS (irritable bowel syndrome)    Interstitial cystitis    Skin cancer    basal and squamous   Past Surgical History:  Procedure Laterality Date   CESAREAN SECTION  08/02/1970   EYE SURGERY Bilateral 10/01/2023   cataract extraction w/ IOL   MOHS SURGERY  2005   scalp   TONSILLECTOMY  08/02/1949   TOTAL ABDOMINAL HYSTERECTOMY W/ BILATERAL SALPINGOOPHORECTOMY  08/02/1988   Dr. Claudene / fibroids    TOTAL HIP ARTHROPLASTY Right 11/16/2023   Procedure: ARTHROPLASTY, HIP, TOTAL, ANTERIOR APPROACH;  Surgeon: Melodi Lerner, MD;  Location: [facility];  Service: Orthopedics;  Laterality: Right;   Patient Active Problem List   Diagnosis Date Noted   OA (osteoarthritis) of hip 11/16/2023   Primary osteoarthritis of right hip 11/16/2023   Osteoarthritis of first carpometacarpal joint of left hand 01/15/2022   Osteoarthritis of first carpometacarpal joint of right hand 01/15/2022   Pain in right knee 08/22/2019   Osteoarthritis of multiple joints 01/25/2019   Lumbar  spondylosis 12/28/2017   Degeneration of lumbar intervertebral disc 12/13/2017   Aortic atherosclerosis (HCC) 09/22/2017   Family history of breast cancer 07/05/2016   Skin cancer 07/05/2016   Vitamin D  deficiency 07/05/2016   Hypothyroidism 12/11/2012   Hyperlipidemia 12/11/2012   Hypertension 12/11/2012   GERD (gastroesophageal reflux disease) 12/11/2012   REFERRING PROVIDER: Selinda Gosling MD  REFERRING DIAG: Subacromial bursitis of right shoulder.  THERAPY DIAG:  Acute pain of right shoulder  Stiffness of right shoulder, not elsewhere classified  Muscle weakness (generalized)  Rationale for Evaluation and Treatment: Rehabilitation  ONSET DATE: March, 2025.  SUBJECTIVE:  SUBJECTIVE STATEMENT: Pain at a 2.  PERTINENT HISTORY: See above.    PAIN:  Are you having pain? Yes: NPRS scale: 2/10. Pain location: Right shoulder. Pain description: As above. Aggravating factors: As above. Relieving factors: As above.    PRECAUTIONS: None  RED FLAGS: None   WEIGHT BEARING RESTRICTIONS: No  FALLS:  Has patient fallen in last 6 months? No  LIVING ENVIRONMENT: Lives with: lives with their spouse Lives in: House/apartment Has following equipment at home: Hurry cane.  OCCUPATION: Retired.  PLOF: Independent  PATIENT GOALS:  Use right UE without pain.  NEXT MD VISIT:   OBJECTIVE:  Note: Objective measures were completed at Evaluation unless otherwise noted.  PATIENT SURVEYS:  Quick Dash 68.18   UPPER EXTREMITY ROM:   Active right shoulder antigravity flexion to 95 degrees and painful and passive to 115 degrees.  ER actively to 45 degrees and passive to 70 degrees and behind back motion to lower lumbar region.  01/26/24:  Right active antigravity flexion to 155 degrees.    UPPER  EXTREMITY MMT:  IR/ER strength tested with elbow by side is 4 to 4+/5.  Deltoid strength decreased to 3-/5 which appears decreased at least in part due to pain.   SHOULDER SPECIAL TESTS: (-) right Drop Arm test.  Some pain reproduction with impingement testing.    PALPATION:  Tender to palpation at right acromial ridge and middle deltoid and right UT.                                                                                                                               TREATMENT DATE:  01/30/24:  UBE x 10 minutes f/b Combo e'stim/US  at 1.50 W/CM2 x 7 minutes f/b STW/M x 6 minutes  f/b IFC at 80-150 Hz on 40% scan x 20 minutes to patient's right shoulder. Normal modality response following removal of modality.   01/26/24:  UBE x 8 minutes f/b UE Ranger on wall x 5 minutes Combo e'stim/US  at 1.50 W/CM2 x 7 minutes f/b STW/M x 7 minutes  f/b IFC at 80-150 Hz on 40% scan x 15 minutes to patient's right shoulder. Normal modality response following removal of modality.  01/24/24:  UBE x 8 minutes f/b Combo e'stim/US  at 1.50 W/CM2 x 7 minutes f/b STW/M with ischemic release technique and IASTM x  8 minutes f/b IFC at 80-150 Hz on 40% scan x 20 minutes to patient's right shoulder. Normal modality response following removal of modality.   PATIENT EDUCATION: Education details: See below. Person educated: Patient Education method: Medical illustrator, handout. Education comprehension: verbalized understanding  HOME EXERCISE PROGRAM: Yellow theraband resisted right shoulder ER/IR.   ASSESSMENT:  CLINICAL IMPRESSION:Patient progressing well and feels about 50% better overall.   OBJECTIVE IMPAIRMENTS: decreased activity tolerance, decreased ROM, decreased strength, increased muscle spasms, and pain.   ACTIVITY LIMITATIONS: carrying, lifting, and reach over head  PARTICIPATION LIMITATIONS: meal prep, cleaning, laundry, and yard  work  [facility] POTENTIAL: Excellent  CLINICAL  DECISION MAKING: Stable/uncomplicated  EVALUATION COMPLEXITY: Low   GOALS:  SHORT TERM GOALS: Target date: 01/12/24  Ind with a initial HEP. Goal status: MET.  LONG TERM GOALS: Target date: 02/09/24  Ind with an advanced HEP.  Goal status: Ongoing.  2.  Active right shoulder flexion to 145 degrees so the patient can easily reach overhead.  Goal status: 155 degrees (01/26/24).  3.  Increase right shoulder strength to a solid 4+/5 to increase stability for performance of functional activities.  Goal status: Ongoing.  4.  Perform ADL's with pain not > 3/10.  Goal status: Ongoing.   PLAN:  PT FREQUENCY: 2x/week  PT DURATION: 6 weeks  PLANNED INTERVENTIONS: 97110-Therapeutic exercises, 97530- Therapeutic activity, [id number]- Neuromuscular re-education, 97535- Self Care, 02859- Manual therapy, G0283- Electrical stimulation (unattended), 97016- Vasopneumatic device, 97035- Ultrasound, Patient/Family education, Cryotherapy, and Moist heat  PLAN FOR NEXT SESSION: Combo e'stim/US , STW/M, UE Ranger, wall ladder.  MT5.   Tessah Patchen, [place], PT 01/30/2024, 11:18 AM

## 2024-02-01 DIAGNOSIS — M7551 Bursitis of right shoulder: Secondary | ICD-10-CM | POA: Diagnosis not present

## 2024-02-06 ENCOUNTER — Ambulatory Visit: Attending: Orthopedic Surgery | Admitting: Physical Therapy

## 2024-02-06 DIAGNOSIS — M6281 Muscle weakness (generalized): Secondary | ICD-10-CM | POA: Diagnosis not present

## 2024-02-06 DIAGNOSIS — M25511 Pain in right shoulder: Secondary | ICD-10-CM | POA: Diagnosis not present

## 2024-02-06 DIAGNOSIS — M25611 Stiffness of right shoulder, not elsewhere classified: Secondary | ICD-10-CM | POA: Insufficient documentation

## 2024-02-06 NOTE — Therapy (Signed)
 OUTPATIENT PHYSICAL THERAPY SHOULDER TREATMENT  Patient Name: Marissa Perez MRN: [id number] DOB:[birth year], 78 y.o., female Today's Date: 02/06/2024  END OF SESSION:  PT End of Session - 02/06/24 1107     Visit Number 9    Number of Visits 12    Date for PT Re-Evaluation 02/09/24    Authorization Type FOTO AT LEAST EVERY 5TH VISIT.  PROGRESS NOTE AT 10TH VISIT.  KX MODIFIER AFTER 15 VISITS.    PT Start Time 1015    PT Stop Time 1111    PT Time Calculation (min) 56 min    Activity Tolerance Patient tolerated treatment well    Behavior During Therapy WFL for tasks assessed/performed           Past Medical History:  Diagnosis Date   Acid reflux    Allergy    Arthritis    Diverticulosis    GERD (gastroesophageal reflux disease)    Heart murmur    as a child only   History of ETT 01/2009   abnormal    History of stomach ulcers 2008   positive h. pylori   Hyperlipidemia    Hypertension    Hypothyroidism    IBS (irritable bowel syndrome)    Interstitial cystitis    Skin cancer    basal and squamous   Past Surgical History:  Procedure Laterality Date   CESAREAN SECTION  08/02/1970   EYE SURGERY Bilateral 10/01/2023   cataract extraction w/ IOL   MOHS SURGERY  2005   scalp   TONSILLECTOMY  08/02/1949   TOTAL ABDOMINAL HYSTERECTOMY W/ BILATERAL SALPINGOOPHORECTOMY  08/02/1988   Dr. Claudene / fibroids    TOTAL HIP ARTHROPLASTY Right 11/16/2023   Procedure: ARTHROPLASTY, HIP, TOTAL, ANTERIOR APPROACH;  Surgeon: Melodi Lerner, MD;  Location: [facility];  Service: Orthopedics;  Laterality: Right;   Patient Active Problem List   Diagnosis Date Noted   OA (osteoarthritis) of hip 11/16/2023   Primary osteoarthritis of right hip 11/16/2023   Osteoarthritis of first carpometacarpal joint of left hand 01/15/2022   Osteoarthritis of first carpometacarpal joint of right hand 01/15/2022   Pain in right knee 08/22/2019   Osteoarthritis of multiple joints 01/25/2019   Lumbar  spondylosis 12/28/2017   Degeneration of lumbar intervertebral disc 12/13/2017   Aortic atherosclerosis (HCC) 09/22/2017   Family history of breast cancer 07/05/2016   Skin cancer 07/05/2016   Vitamin D  deficiency 07/05/2016   Hypothyroidism 12/11/2012   Hyperlipidemia 12/11/2012   Hypertension 12/11/2012   GERD (gastroesophageal reflux disease) 12/11/2012   REFERRING PROVIDER: Selinda Gosling MD  REFERRING DIAG: Subacromial bursitis of right shoulder.  THERAPY DIAG:  Acute pain of right shoulder  Stiffness of right shoulder, not elsewhere classified  Rationale for Evaluation and Treatment: Rehabilitation  ONSET DATE: March, 2025.  SUBJECTIVE:  SUBJECTIVE STATEMENT: Got an injection last week.  Helped.  Doctor said I shoulder be able to get about 85% better.   PERTINENT HISTORY: See above.    PAIN:  Are you having pain? Yes: NPRS scale: 2/10. Pain location: Right shoulder. Pain description: As above. Aggravating factors: As above. Relieving factors: As above.    PRECAUTIONS: None  RED FLAGS: None   WEIGHT BEARING RESTRICTIONS: No  FALLS:  Has patient fallen in last 6 months? No  LIVING ENVIRONMENT: Lives with: lives with their spouse Lives in: House/apartment Has following equipment at home: Hurry cane.  OCCUPATION: Retired.  PLOF: Independent  PATIENT GOALS:  Use right UE without pain.  NEXT MD VISIT:   OBJECTIVE:  Note: Objective measures were completed at Evaluation unless otherwise noted.  PATIENT SURVEYS:  Quick Dash 68.18   UPPER EXTREMITY ROM:   Active right shoulder antigravity flexion to 95 degrees and painful and passive to 115 degrees.  ER actively to 45 degrees and passive to 70 degrees and behind back motion to lower lumbar region.  01/26/24:  Right active  antigravity flexion to 155 degrees.    UPPER EXTREMITY MMT:  IR/ER strength tested with elbow by side is 4 to 4+/5.  Deltoid strength decreased to 3-/5 which appears decreased at least in part due to pain.   SHOULDER SPECIAL TESTS: (-) right Drop Arm test.  Some pain reproduction with impingement testing.    PALPATION:  Tender to palpation at right acromial ridge and middle deltoid and right UT.                                                                                                                               TREATMENT DATE:  02/06/24:  UBE at 90 RPM's x 10 minutes f/b RW 4 Yellow theraband for ER and other motions red therband f/b Combo e'stim/US  at 1.50 W/CM2 x 7 minutes f/b ISATM x 5 minutes f/b IFC at 80-150 Hz on 40% scan x 20 minutes.  01/30/24:  UBE x 10 minutes f/b Combo e'stim/US  at 1.50 W/CM2 x 7 minutes f/b STW/M x 6 minutes  f/b IFC at 80-150 Hz on 40% scan x 20 minutes to patient's right shoulder. Normal modality response following removal of modality.   01/26/24:  UBE x 8 minutes f/b UE Ranger on wall x 5 minutes Combo e'stim/US  at 1.50 W/CM2 x 7 minutes f/b STW/M x 7 minutes  f/b IFC at 80-150 Hz on 40% scan x 15 minutes to patient's right shoulder. Normal modality response following removal of modality.  01/24/24:  UBE x 8 minutes f/b Combo e'stim/US  at 1.50 W/CM2 x 7 minutes f/b STW/M with ischemic release technique and IASTM x  8 minutes f/b IFC at 80-150 Hz on 40% scan x 20 minutes to patient's right shoulder. Normal modality response following removal of modality.   PATIENT EDUCATION: Education details: See below. Person educated: Patient Education method: Medical illustrator, handout.  Education comprehension: verbalized understanding  HOME EXERCISE PROGRAM: Yellow theraband resisted right shoulder ER/IR.   ASSESSMENT:  CLINICAL IMPRESSION:Patient progressing well and feels about 50% better overall.   OBJECTIVE IMPAIRMENTS: decreased activity  tolerance, decreased ROM, decreased strength, increased muscle spasms, and pain.   ACTIVITY LIMITATIONS: carrying, lifting, and reach over head  PARTICIPATION LIMITATIONS: meal prep, cleaning, laundry, and yard work  [facility] POTENTIAL: Excellent  CLINICAL DECISION MAKING: Stable/uncomplicated  EVALUATION COMPLEXITY: Low   GOALS:  SHORT TERM GOALS: Target date: 01/12/24  Ind with a initial HEP. Goal status: MET.  LONG TERM GOALS: Target date: 02/09/24  Ind with an advanced HEP.  Goal status: Ongoing.  2.  Active right shoulder flexion to 145 degrees so the patient can easily reach overhead.  Goal status: 155 degrees (01/26/24).  3.  Increase right shoulder strength to a solid 4+/5 to increase stability for performance of functional activities.  Goal status: Ongoing.  4.  Perform ADL's with pain not > 3/10.  Goal status: Ongoing.   PLAN:  PT FREQUENCY: 2x/week  PT DURATION: 6 weeks  PLANNED INTERVENTIONS: 97110-Therapeutic exercises, 97530- Therapeutic activity, [id number]- Neuromuscular re-education, 97535- Self Care, 02859- Manual therapy, G0283- Electrical stimulation (unattended), 97016- Vasopneumatic device, 97035- Ultrasound, Patient/Family education, Cryotherapy, and Moist heat  PLAN FOR NEXT SESSION: Combo e'stim/US , STW/M, UE Ranger, wall ladder.  MT5.   Thorn Demas, [place], PT 02/06/2024, 11:54 AM

## 2024-02-16 ENCOUNTER — Ambulatory Visit: Admitting: Physical Therapy

## 2024-02-16 DIAGNOSIS — M25511 Pain in right shoulder: Secondary | ICD-10-CM

## 2024-02-16 DIAGNOSIS — M6281 Muscle weakness (generalized): Secondary | ICD-10-CM | POA: Diagnosis not present

## 2024-02-16 DIAGNOSIS — M25611 Stiffness of right shoulder, not elsewhere classified: Secondary | ICD-10-CM

## 2024-02-16 NOTE — Therapy (Signed)
 OUTPATIENT PHYSICAL THERAPY SHOULDER TREATMENT  Patient Name: Marissa Perez MRN: [id number] DOB:1945/08/09, 78 y.o., female Today's Date: 02/16/2024  END OF SESSION:  PT End of Session - 02/16/24 1453     Visit Number 10    Number of Visits 14    Date for PT Re-Evaluation 03/08/24    PT Start Time 0145    PT Stop Time 0239    PT Time Calculation (min) 54 min    Activity Tolerance Patient tolerated treatment well    Behavior During Therapy [facility] for tasks assessed/performed            Past Medical History:  Diagnosis Date   Acid reflux    Allergy    Arthritis    Diverticulosis    GERD (gastroesophageal reflux disease)    Heart murmur    as a child only   History of ETT 01/2009   abnormal    History of stomach ulcers 2008   positive h. pylori   Hyperlipidemia    Hypertension    Hypothyroidism    IBS (irritable bowel syndrome)    Interstitial cystitis    Skin cancer    basal and squamous   Past Surgical History:  Procedure Laterality Date   CESAREAN SECTION  08/02/1970   EYE SURGERY Bilateral 10/01/2023   cataract extraction w/ IOL   MOHS SURGERY  2005   scalp   TONSILLECTOMY  08/02/1949   TOTAL ABDOMINAL HYSTERECTOMY W/ BILATERAL SALPINGOOPHORECTOMY  08/02/1988   Dr. Claudene / fibroids    TOTAL HIP ARTHROPLASTY Right 11/16/2023   Procedure: ARTHROPLASTY, HIP, TOTAL, ANTERIOR APPROACH;  Surgeon: Melodi Lerner, MD;  Location: [facility];  Service: Orthopedics;  Laterality: Right;   Patient Active Problem List   Diagnosis Date Noted   OA (osteoarthritis) of hip 11/16/2023   Primary osteoarthritis of right hip 11/16/2023   Osteoarthritis of first carpometacarpal joint of left hand 01/15/2022   Osteoarthritis of first carpometacarpal joint of right hand 01/15/2022   Pain in right knee 08/22/2019   Osteoarthritis of multiple joints 01/25/2019   Lumbar spondylosis 12/28/2017   Degeneration of lumbar intervertebral disc 12/13/2017   Aortic atherosclerosis (HCC)  09/22/2017   Family history of breast cancer 07/05/2016   Skin cancer 07/05/2016   Vitamin D  deficiency 07/05/2016   Hypothyroidism 12/11/2012   Hyperlipidemia 12/11/2012   Hypertension 12/11/2012   GERD (gastroesophageal reflux disease) 12/11/2012   REFERRING PROVIDER: Selinda Gosling MD  REFERRING DIAG: Subacromial bursitis of right shoulder.  THERAPY DIAG:  Acute pain of right shoulder - Plan: PT plan of care cert/re-cert  Stiffness of right shoulder, not elsewhere classified - Plan: PT plan of care cert/re-cert  Rationale for Evaluation and Treatment: Rehabilitation  ONSET DATE: March, 2025.  SUBJECTIVE:  SUBJECTIVE STATEMENT: Pain around a 3/10.   PERTINENT HISTORY: See above.    PAIN:  Are you having pain? Yes: NPRS scale: 3/10. Pain location: Right shoulder. Pain description: As above. Aggravating factors: As above. Relieving factors: As above.    PRECAUTIONS: None  RED FLAGS: None   WEIGHT BEARING RESTRICTIONS: No  FALLS:  Has patient fallen in last 6 months? No  LIVING ENVIRONMENT: Lives with: lives with their spouse Lives in: House/apartment Has following equipment at home: Hurry cane.  OCCUPATION: Retired.  PLOF: Independent  PATIENT GOALS:  Use right UE without pain.  NEXT MD VISIT:   OBJECTIVE:  Note: Objective measures were completed at Evaluation unless otherwise noted.  PATIENT SURVEYS:  Quick Dash 68.18   UPPER EXTREMITY ROM:   Active right shoulder antigravity flexion to 95 degrees and painful and passive to 115 degrees.  ER actively to 45 degrees and passive to 70 degrees and behind back motion to lower lumbar region.  01/26/24:  Right active antigravity flexion to 155 degrees.    UPPER EXTREMITY MMT:  IR/ER strength tested with elbow by side is 4  to 4+/5.  Deltoid strength decreased to 3-/5 which appears decreased at least in part due to pain.   SHOULDER SPECIAL TESTS: (-) right Drop Arm test.  Some pain reproduction with impingement testing.    PALPATION:  Tender to palpation at right acromial ridge and middle deltoid and right UT.                                                                                                                               TREATMENT DATE:  02/16/24:  UBE x 10 minutes f/b ER to fatigue with yellow theraband and to fatigue with 1# f/b combo e'stim/US  at 1.50 W/CM2 x 5 minutes f/b STW/M x 10 minutes to right middle IFC at 80-150 Hz on 40% scan x 20 minutes.    02/06/24:  UBE at 90 RPM's x 10 minutes f/b RW 4 Yellow theraband for ER and other motions red therband f/b Combo e'stim/US  at 1.50 W/CM2 x 7 minutes f/b ISATM x 5 minutes f/b IFC at 80-150 Hz on 40% scan x 20 minutes.  Normal modality response following removal of modality.     PATIENT EDUCATION: Education details: See below. Person educated: Patient Education method: Medical illustrator, handout. Education comprehension: verbalized understanding  HOME EXERCISE PROGRAM:   HOME EXERCISE PROGRAM [[id number]]  Sidelying ER -  Repeat 15 Repetitions, Complete 2 Sets, Perform 2 Times a Day ASSESSMENT:  CLINICAL IMPRESSION:  Patient progressing well.  She has met her range of motion goal at this time.   Recommend continuation of PT as she is making very good progress toward goals.     OBJECTIVE IMPAIRMENTS: decreased activity tolerance, decreased ROM, decreased strength, increased muscle spasms, and pain.   ACTIVITY LIMITATIONS: carrying, lifting, and reach over head  PARTICIPATION LIMITATIONS: meal prep, cleaning, laundry, and yard work  [facility] POTENTIAL: Excellent  CLINICAL DECISION MAKING: Stable/uncomplicated  EVALUATION COMPLEXITY: Low   GOALS:  SHORT TERM GOALS: Target date: 01/12/24  Ind with a initial HEP. Goal  status: MET.  LONG TERM GOALS: Target date: 02/09/24  Ind with an advanced HEP.  Goal status: Ongoing.  2.  Active right shoulder flexion to 145 degrees so the patient can easily reach overhead.  Goal status: 155 degrees (01/26/24).  3.  Increase right shoulder strength to a solid 4+/5 to increase stability for performance of functional activities.  Goal status: Ongoing.  4.  Perform ADL's with pain not > 3/10.  Goal status: Ongoing.   PLAN:  PT FREQUENCY: 2x/week  PT DURATION: 6 weeks  PLANNED INTERVENTIONS: 97110-Therapeutic exercises, 97530- Therapeutic activity, [id number]- Neuromuscular re-education, 97535- Self Care, 02859- Manual therapy, G0283- Electrical stimulation (unattended), 97016- Vasopneumatic device, 97035- Ultrasound, Patient/Family education, Cryotherapy, and Moist heat  PLAN FOR NEXT SESSION: Combo e'stim/US , STW/M, UE Ranger, wall ladder.  MT5.   Towanna Avery, [place], PT 02/16/2024, 2:56 PM

## 2024-02-28 ENCOUNTER — Ambulatory Visit: Admitting: Physical Therapy

## 2024-02-28 DIAGNOSIS — M6281 Muscle weakness (generalized): Secondary | ICD-10-CM

## 2024-02-28 DIAGNOSIS — M25511 Pain in right shoulder: Secondary | ICD-10-CM

## 2024-02-28 DIAGNOSIS — M25611 Stiffness of right shoulder, not elsewhere classified: Secondary | ICD-10-CM

## 2024-02-28 NOTE — Therapy (Addendum)
 OUTPATIENT PHYSICAL THERAPY SHOULDER TREATMENT  Patient Name: GIA LUSHER MRN: [id number] DOB:[birth year], 78 y.o., female Today's Date: 02/28/2024  END OF SESSION:  PT End of Session - 02/28/24 1006     Visit Number 11    Number of Visits 14    Date for PT Re-Evaluation 03/08/24    Authorization Type FOTO AT LEAST EVERY 5TH VISIT.  PROGRESS NOTE AT 10TH VISIT.  KX MODIFIER AFTER 15 VISITS.    PT Start Time [phone]    PT Stop Time 0940    PT Time Calculation (min) 54 min    Activity Tolerance Patient tolerated treatment well    Behavior During Therapy [facility] for tasks assessed/performed             Past Medical History:  Diagnosis Date   Acid reflux    Allergy    Arthritis    Diverticulosis    GERD (gastroesophageal reflux disease)    Heart murmur    as a child only   History of ETT 01/2009   abnormal    History of stomach ulcers 2008   positive h. pylori   Hyperlipidemia    Hypertension    Hypothyroidism    IBS (irritable bowel syndrome)    Interstitial cystitis    Skin cancer    basal and squamous   Past Surgical History:  Procedure Laterality Date   CESAREAN SECTION  08/02/1970   EYE SURGERY Bilateral 10/01/2023   cataract extraction w/ IOL   MOHS SURGERY  2005   scalp   TONSILLECTOMY  08/02/1949   TOTAL ABDOMINAL HYSTERECTOMY W/ BILATERAL SALPINGOOPHORECTOMY  08/02/1988   Dr. Claudene / fibroids    TOTAL HIP ARTHROPLASTY Right 11/16/2023   Procedure: ARTHROPLASTY, HIP, TOTAL, ANTERIOR APPROACH;  Surgeon: Melodi Lerner, MD;  Location: [facility];  Service: Orthopedics;  Laterality: Right;   Patient Active Problem List   Diagnosis Date Noted   OA (osteoarthritis) of hip 11/16/2023   Primary osteoarthritis of right hip 11/16/2023   Osteoarthritis of first carpometacarpal joint of left hand 01/15/2022   Osteoarthritis of first carpometacarpal joint of right hand 01/15/2022   Pain in right knee 08/22/2019   Osteoarthritis of multiple joints 01/25/2019   Lumbar  spondylosis 12/28/2017   Degeneration of lumbar intervertebral disc 12/13/2017   Aortic atherosclerosis (HCC) 09/22/2017   Family history of breast cancer 07/05/2016   Skin cancer 07/05/2016   Vitamin D  deficiency 07/05/2016   Hypothyroidism 12/11/2012   Hyperlipidemia 12/11/2012   Hypertension 12/11/2012   GERD (gastroesophageal reflux disease) 12/11/2012   REFERRING PROVIDER: Selinda Gosling MD  REFERRING DIAG: Subacromial bursitis of right shoulder.  THERAPY DIAG:  Acute pain of right shoulder  Stiffness of right shoulder, not elsewhere classified  Muscle weakness (generalized)  Rationale for Evaluation and Treatment: Rehabilitation  ONSET DATE: March, 2025.  SUBJECTIVE:  SUBJECTIVE STATEMENT: About 70% better overall.  Drove a lot which made my shoulder hurt.  PERTINENT HISTORY: See above.    PAIN:  Are you having pain? Yes: NPRS scale: 3/10. Pain location: Right shoulder. Pain description: As above. Aggravating factors: As above. Relieving factors: As above.    PRECAUTIONS: None  RED FLAGS: None   WEIGHT BEARING RESTRICTIONS: No  FALLS:  Has patient fallen in last 6 months? No  LIVING ENVIRONMENT: Lives with: lives with their spouse Lives in: House/apartment Has following equipment at home: Hurry cane.  OCCUPATION: Retired.  PLOF: Independent  PATIENT GOALS:  Use right UE without pain.  NEXT MD VISIT:   OBJECTIVE:  Note: Objective measures were completed at Evaluation unless otherwise noted.  PATIENT SURVEYS:  Quick Dash 68.18   UPPER EXTREMITY ROM:   Active right shoulder antigravity flexion to 95 degrees and painful and passive to 115 degrees.  ER actively to 45 degrees and passive to 70 degrees and behind back motion to lower lumbar region.  01/26/24:  Right  active antigravity flexion to 155 degrees.    UPPER EXTREMITY MMT:  IR/ER strength tested with elbow by side is 4 to 4+/5.  Deltoid strength decreased to 3-/5 which appears decreased at least in part due to pain.   SHOULDER SPECIAL TESTS: (-) right Drop Arm test.  Some pain reproduction with impingement testing.    PALPATION:  Tender to palpation at right acromial ridge and middle deltoid and right UT.                                                                                                                               TREATMENT DATE:  02/28/24:   UBE x 10 minutes f/b combo e'stim/US  at 1.50 W/CM2 x 7 minutes f/b STW/M x 10 minutes to right middle and posterior cuff musculature f/b IFC at 80-150 Hz on 40% scan x 20 minutes.  02/16/24:  UBE x 10 minutes f/b ER to fatigue with yellow theraband and to fatigue with 1# f/b combo e'stim/US  at 1.50 W/CM2 x 5 minutes f/b STW/M x 10 minutes to right middle IFC at 80-150 Hz on 40% scan x 20 minutes.    PATIENT EDUCATION: Education details: See below. Person educated: Patient Education method: Medical illustrator, handout. Education comprehension: verbalized understanding  HOME EXERCISE PROGRAM:   HOME EXERCISE PROGRAM [[id number]]  Sidelying ER -  Repeat 15 Repetitions, Complete 2 Sets, Perform 2 Times a Day ASSESSMENT:  CLINICAL IMPRESSION: Patient reporting a subjective improvement rating of 70%.  Increased tone in right posterior cuff.  Very god response to STW/M.    OBJECTIVE IMPAIRMENTS: decreased activity tolerance, decreased ROM, decreased strength, increased muscle spasms, and pain.   ACTIVITY LIMITATIONS: carrying, lifting, and reach over head  PARTICIPATION LIMITATIONS: meal prep, cleaning, laundry, and yard work  [facility] POTENTIAL: Excellent  CLINICAL DECISION MAKING: Stable/uncomplicated  EVALUATION COMPLEXITY: Low   GOALS:  SHORT TERM GOALS: Target  date: 01/12/24  Ind with a initial HEP. Goal status:  MET.  LONG TERM GOALS: Target date: 02/09/24  Ind with an advanced HEP.  Goal status: Ongoing.  2.  Active right shoulder flexion to 145 degrees so the patient can easily reach overhead.  Goal status: 155 degrees (01/26/24).  3.  Increase right shoulder strength to a solid 4+/5 to increase stability for performance of functional activities.  Goal status: Ongoing.  4.  Perform ADL's with pain not > 3/10.  Goal status: Ongoing.   PLAN:  PT FREQUENCY: 2x/week  PT DURATION: 6 weeks  PLANNED INTERVENTIONS: 97110-Therapeutic exercises, 97530- Therapeutic activity, [id number]- Neuromuscular re-education, 97535- Self Care, 02859- Manual therapy, G0283- Electrical stimulation (unattended), 97016- Vasopneumatic device, 97035- Ultrasound, Patient/Family education, Cryotherapy, and Moist heat  PLAN FOR NEXT SESSION: Combo e'stim/US , STW/M, UE Ranger, wall ladder.  MT5.   Khaliah Barnick, [place], PT 02/28/2024, 12:14 PM

## 2024-03-01 ENCOUNTER — Ambulatory Visit: Admitting: Physical Therapy

## 2024-03-01 DIAGNOSIS — M25611 Stiffness of right shoulder, not elsewhere classified: Secondary | ICD-10-CM

## 2024-03-01 DIAGNOSIS — M25511 Pain in right shoulder: Secondary | ICD-10-CM | POA: Diagnosis not present

## 2024-03-01 DIAGNOSIS — M6281 Muscle weakness (generalized): Secondary | ICD-10-CM

## 2024-03-01 NOTE — Therapy (Addendum)
 OUTPATIENT PHYSICAL THERAPY SHOULDER TREATMENT  Patient Name: Marissa Perez MRN: [id number] DOB:July 20, 1946, 78 y.o., female Today's Date: 03/01/2024  END OF SESSION:  PT End of Session - 03/01/24 0936     Visit Number 12    Number of Visits 14    Date for PT Re-Evaluation 03/08/24    PT Start Time 0925    PT Stop Time 1028    PT Time Calculation (min) 63 min    Activity Tolerance Patient tolerated treatment well    Behavior During Therapy [facility] for tasks assessed/performed             Past Medical History:  Diagnosis Date   Acid reflux    Allergy    Arthritis    Diverticulosis    GERD (gastroesophageal reflux disease)    Heart murmur    as a child only   History of ETT 01/2009   abnormal    History of stomach ulcers 2008   positive h. pylori   Hyperlipidemia    Hypertension    Hypothyroidism    IBS (irritable bowel syndrome)    Interstitial cystitis    Skin cancer    basal and squamous   Past Surgical History:  Procedure Laterality Date   CESAREAN SECTION  08/02/1970   EYE SURGERY Bilateral 10/01/2023   cataract extraction w/ IOL   MOHS SURGERY  2005   scalp   TONSILLECTOMY  08/02/1949   TOTAL ABDOMINAL HYSTERECTOMY W/ BILATERAL SALPINGOOPHORECTOMY  08/02/1988   Dr. Claudene / fibroids    TOTAL HIP ARTHROPLASTY Right 11/16/2023   Procedure: ARTHROPLASTY, HIP, TOTAL, ANTERIOR APPROACH;  Surgeon: Melodi Lerner, MD;  Location: [facility];  Service: Orthopedics;  Laterality: Right;   Patient Active Problem List   Diagnosis Date Noted   OA (osteoarthritis) of hip 11/16/2023   Primary osteoarthritis of right hip 11/16/2023   Osteoarthritis of first carpometacarpal joint of left hand 01/15/2022   Osteoarthritis of first carpometacarpal joint of right hand 01/15/2022   Pain in right knee 08/22/2019   Osteoarthritis of multiple joints 01/25/2019   Lumbar spondylosis 12/28/2017   Degeneration of lumbar intervertebral disc 12/13/2017   Aortic atherosclerosis (HCC)  09/22/2017   Family history of breast cancer 07/05/2016   Skin cancer 07/05/2016   Vitamin D  deficiency 07/05/2016   Hypothyroidism 12/11/2012   Hyperlipidemia 12/11/2012   Hypertension 12/11/2012   GERD (gastroesophageal reflux disease) 12/11/2012   REFERRING PROVIDER: Selinda Gosling MD  REFERRING DIAG: Subacromial bursitis of right shoulder.  THERAPY DIAG:  Acute pain of right shoulder  Stiffness of right shoulder, not elsewhere classified  Muscle weakness (generalized)  Rationale for Evaluation and Treatment: Rehabilitation  ONSET DATE: March, 2025.  SUBJECTIVE:  SUBJECTIVE STATEMENT: Pain around a 3.  PERTINENT HISTORY: See above.    PAIN:  Are you having pain? Yes: NPRS scale: 3/10. Pain location: Right shoulder. Pain description: As above. Aggravating factors: As above. Relieving factors: As above.    PRECAUTIONS: None  RED FLAGS: None   WEIGHT BEARING RESTRICTIONS: No  FALLS:  Has patient fallen in last 6 months? No  LIVING ENVIRONMENT: Lives with: lives with their spouse Lives in: House/apartment Has following equipment at home: Hurry cane.  OCCUPATION: Retired.  PLOF: Independent  PATIENT GOALS:  Use right UE without pain.  NEXT MD VISIT:   OBJECTIVE:  Note: Objective measures were completed at Evaluation unless otherwise noted.  PATIENT SURVEYS:  Quick Dash 68.18   UPPER EXTREMITY ROM:   Active right shoulder antigravity flexion to 95 degrees and painful and passive to 115 degrees.  ER actively to 45 degrees and passive to 70 degrees and behind back motion to lower lumbar region.  01/26/24:  Right active antigravity flexion to 155 degrees.    UPPER EXTREMITY MMT:  IR/ER strength tested with elbow by side is 4 to 4+/5.  Deltoid strength decreased to 3-/5  which appears decreased at least in part due to pain.   SHOULDER SPECIAL TESTS: (-) right Drop Arm test.  Some pain reproduction with impingement testing.    PALPATION:  Tender to palpation at right acromial ridge and middle deltoid and right UT.                                                                                                                               TREATMENT DATE:  03/01/24:  UBE x 8 minutes f/b RW4 with red theraband to fatigue f/b 3# bicep curls to fatigue x 2 and 1# full can to fatigue f/b combo e'stim/U/S at 1.50 W/CM2 x 8 minutes f/b STW/M x 15 minutes f/b IFC at 80-150 Hz on 40% scan x 20 minutes.  Normal modality response following removal of modality  02/28/24:   UBE x 10 minutes f/b combo e'stim/US  at 1.50 W/CM2 x 7 minutes f/b STW/M x 10 minutes to right middle and posterior cuff musculature f/b IFC at 80-150 Hz on 40% scan x 20 minutes.  02/16/24:  UBE x 10 minutes f/b ER to fatigue with yellow theraband and to fatigue with 1# f/b combo e'stim/US  at 1.50 W/CM2 x 5 minutes f/b STW/M x 10 minutes to right middle IFC at 80-150 Hz on 40% scan x 20 minutes.    PATIENT EDUCATION: Education details: See below. Person educated: Patient Education method: Medical illustrator, handout. Education comprehension: verbalized understanding  HOME EXERCISE PROGRAM:   HOME EXERCISE PROGRAM [phone]  Standing Scaption full range -  Repeat 10 Repetitions, Hold 1 Second(s), Complete 2 Sets, Perform 2 Times a Day  CLINICAL IMPRESSION: Patient reporting a subjective improvement rating of 70%.  Added full can exercise with 1#.   OBJECTIVE IMPAIRMENTS: decreased activity tolerance, decreased  ROM, decreased strength, increased muscle spasms, and pain.   ACTIVITY LIMITATIONS: carrying, lifting, and reach over head  PARTICIPATION LIMITATIONS: meal prep, cleaning, laundry, and yard work  [facility] POTENTIAL: Excellent  CLINICAL DECISION MAKING:  Stable/uncomplicated  EVALUATION COMPLEXITY: Low   GOALS:  SHORT TERM GOALS: Target date: 01/12/24  Ind with a initial HEP. Goal status: MET.  LONG TERM GOALS: Target date: 02/09/24  Ind with an advanced HEP.  Goal status: Ongoing.  2.  Active right shoulder flexion to 145 degrees so the patient can easily reach overhead.  Goal status: 155 degrees (01/26/24).  3.  Increase right shoulder strength to a solid 4+/5 to increase stability for performance of functional activities.  Goal status: Ongoing.  4.  Perform ADL's with pain not > 3/10.  Goal status: Ongoing.   PLAN:  PT FREQUENCY: 2x/week  PT DURATION: 6 weeks  PLANNED INTERVENTIONS: 97110-Therapeutic exercises, 97530- Therapeutic activity, [id number]- Neuromuscular re-education, 97535- Self Care, 02859- Manual therapy, G0283- Electrical stimulation (unattended), 97016- Vasopneumatic device, 97035- Ultrasound, Patient/Family education, Cryotherapy, and Moist heat  PLAN FOR NEXT SESSION: Combo e'stim/US , STW/M, UE Ranger, wall ladder.  MT5.   Brylyn Novakovich, [place], PT 03/01/2024, 10:46 AM

## 2024-03-06 ENCOUNTER — Encounter: Payer: Self-pay | Admitting: Physical Therapy

## 2024-03-06 ENCOUNTER — Ambulatory Visit: Attending: Orthopedic Surgery | Admitting: Physical Therapy

## 2024-03-06 DIAGNOSIS — M25611 Stiffness of right shoulder, not elsewhere classified: Secondary | ICD-10-CM | POA: Diagnosis not present

## 2024-03-06 DIAGNOSIS — M6281 Muscle weakness (generalized): Secondary | ICD-10-CM | POA: Diagnosis not present

## 2024-03-06 DIAGNOSIS — M25511 Pain in right shoulder: Secondary | ICD-10-CM | POA: Diagnosis not present

## 2024-03-06 NOTE — Therapy (Signed)
 OUTPATIENT PHYSICAL THERAPY SHOULDER TREATMENT  Patient Name: Marissa Perez MRN: [id number] DOB:Mar 21, 1946, 78 y.o., female Today's Date: 03/06/2024  END OF SESSION:  PT End of Session - 03/06/24 0930     Visit Number 13    Number of Visits 14    Date for PT Re-Evaluation 03/08/24    Authorization Type FOTO AT LEAST EVERY 5TH VISIT.  PROGRESS NOTE AT 10TH VISIT.  KX MODIFIER AFTER 15 VISITS.    PT Start Time 0930    PT Stop Time 1014    PT Time Calculation (min) 44 min    Activity Tolerance Patient tolerated treatment well    Behavior During Therapy [facility] for tasks assessed/performed             Past Medical History:  Diagnosis Date   Acid reflux    Allergy    Arthritis    Diverticulosis    GERD (gastroesophageal reflux disease)    Heart murmur    as a child only   History of ETT 01/2009   abnormal    History of stomach ulcers 2008   positive h. pylori   Hyperlipidemia    Hypertension    Hypothyroidism    IBS (irritable bowel syndrome)    Interstitial cystitis    Skin cancer    basal and squamous   Past Surgical History:  Procedure Laterality Date   CESAREAN SECTION  08/02/1970   EYE SURGERY Bilateral 10/01/2023   cataract extraction w/ IOL   MOHS SURGERY  2005   scalp   TONSILLECTOMY  08/02/1949   TOTAL ABDOMINAL HYSTERECTOMY W/ BILATERAL SALPINGOOPHORECTOMY  08/02/1988   Dr. Claudene / fibroids    TOTAL HIP ARTHROPLASTY Right 11/16/2023   Procedure: ARTHROPLASTY, HIP, TOTAL, ANTERIOR APPROACH;  Surgeon: Melodi Lerner, MD;  Location: [facility];  Service: Orthopedics;  Laterality: Right;   Patient Active Problem List   Diagnosis Date Noted   OA (osteoarthritis) of hip 11/16/2023   Primary osteoarthritis of right hip 11/16/2023   Osteoarthritis of first carpometacarpal joint of left hand 01/15/2022   Osteoarthritis of first carpometacarpal joint of right hand 01/15/2022   Pain in right knee 08/22/2019   Osteoarthritis of multiple joints 01/25/2019   Lumbar  spondylosis 12/28/2017   Degeneration of lumbar intervertebral disc 12/13/2017   Aortic atherosclerosis (HCC) 09/22/2017   Family history of breast cancer 07/05/2016   Skin cancer 07/05/2016   Vitamin D  deficiency 07/05/2016   Hypothyroidism 12/11/2012   Hyperlipidemia 12/11/2012   Hypertension 12/11/2012   GERD (gastroesophageal reflux disease) 12/11/2012   REFERRING PROVIDER: Selinda Gosling MD  REFERRING DIAG: Subacromial bursitis of right shoulder.  THERAPY DIAG:  Acute pain of right shoulder  Stiffness of right shoulder, not elsewhere classified  Rationale for Evaluation and Treatment: Rehabilitation  ONSET DATE: March, 2025.  SUBJECTIVE:  SUBJECTIVE STATEMENT: Some days patient states she feels better than 70%.   PERTINENT HISTORY: See above.    PAIN:  Are you having pain? Yes: NPRS scale: 3/10. Pain location: Right shoulder. Pain description: As above. Aggravating factors: As above. Relieving factors: As above.    PRECAUTIONS: None  RED FLAGS: None   WEIGHT BEARING RESTRICTIONS: No  FALLS:  Has patient fallen in last 6 months? No  LIVING ENVIRONMENT: Lives with: lives with their spouse Lives in: House/apartment Has following equipment at home: Hurry cane.  OCCUPATION: Retired.  PLOF: Independent  PATIENT GOALS:  Use right UE without pain.  NEXT MD VISIT:   OBJECTIVE:  Note: Objective measures were completed at Evaluation unless otherwise noted.  PATIENT SURVEYS:  Quick Dash 68.18   UPPER EXTREMITY ROM:   Active right shoulder antigravity flexion to 95 degrees and painful and passive to 115 degrees.  ER actively to 45 degrees and passive to 70 degrees and behind back motion to lower lumbar region.  01/26/24:  Right active antigravity flexion to 155 degrees.     UPPER EXTREMITY MMT:  IR/ER strength tested with elbow by side is 4 to 4+/5.  Deltoid strength decreased to 3-/5 which appears decreased at least in part due to pain.   SHOULDER SPECIAL TESTS: (-) right Drop Arm test.  Some pain reproduction with impingement testing.    PALPATION:  Tender to palpation at right acromial ridge and middle deltoid and right UT.                                                                                                                               TREATMENT DATE:  03/06/24;  UBE at 90 RPM's x 10 minutes f/b combo e'stim/US  at 1.50 W/CM2 x 7 minutes to patient's right posterior cuff musculature f/b STW/M x 8 minutes f/b IFC at 80-150 Hz on 40% scan x 17 minutes.  Normal modality response following removal of modality   03/01/24:  UBE x 8 minutes f/b RW4 with red theraband to fatigue f/b 3# bicep curls to fatigue x 2 and 1# full can to fatigue f/b combo e'stim/U/S at 1.50 W/CM2 x 8 minutes f/b STW/M x 15 minutes f/b IFC at 80-150 Hz on 40% scan x 20 minutes.  Normal modality response following removal of modality     PATIENT EDUCATION: Education details: See below. Person educated: Patient Education method: Medical illustrator, handout. Education comprehension: verbalized understanding  HOME EXERCISE PROGRAM:   HOME EXERCISE PROGRAM [phone]  Standing Scaption full range -  Repeat 10 Repetitions, Hold 1 Second(s), Complete 2 Sets, Perform 2 Times a Day  CLINICAL IMPRESSION: Patient doing much better overall.  She will be out of town for several days and will continue with her HEP.   OBJECTIVE IMPAIRMENTS: decreased activity tolerance, decreased ROM, decreased strength, increased muscle spasms, and pain.   ACTIVITY LIMITATIONS: carrying, lifting, and reach over head  PARTICIPATION LIMITATIONS: meal prep,  cleaning, laundry, and yard work  [facility] POTENTIAL: Excellent  CLINICAL DECISION MAKING: Stable/uncomplicated  EVALUATION  COMPLEXITY: Low   GOALS:  SHORT TERM GOALS: Target date: 01/12/24  Ind with a initial HEP. Goal status: MET.  LONG TERM GOALS: Target date: 02/09/24  Ind with an advanced HEP.  Goal status: Ongoing.  2.  Active right shoulder flexion to 145 degrees so the patient can easily reach overhead.  Goal status: 155 degrees (01/26/24).  3.  Increase right shoulder strength to a solid 4+/5 to increase stability for performance of functional activities.  Goal status: Ongoing.  4.  Perform ADL's with pain not > 3/10.  Goal status: Ongoing.   PLAN:  PT FREQUENCY: 2x/week  PT DURATION: 6 weeks  PLANNED INTERVENTIONS: 97110-Therapeutic exercises, 97530- Therapeutic activity, [id number]- Neuromuscular re-education, 97535- Self Care, 02859- Manual therapy, G0283- Electrical stimulation (unattended), 97016- Vasopneumatic device, 97035- Ultrasound, Patient/Family education, Cryotherapy, and Moist heat  PLAN FOR NEXT SESSION: Combo e'stim/US , STW/M, UE Ranger, wall ladder.  MT5.   Nikash Mortensen, [place], PT 03/06/2024, 10:46 AM

## 2024-03-07 ENCOUNTER — Other Ambulatory Visit: Payer: Self-pay | Admitting: Family Medicine

## 2024-03-07 DIAGNOSIS — D225 Melanocytic nevi of trunk: Secondary | ICD-10-CM | POA: Diagnosis not present

## 2024-03-07 DIAGNOSIS — D2262 Melanocytic nevi of left upper limb, including shoulder: Secondary | ICD-10-CM | POA: Diagnosis not present

## 2024-03-07 DIAGNOSIS — D2272 Melanocytic nevi of left lower limb, including hip: Secondary | ICD-10-CM | POA: Diagnosis not present

## 2024-03-07 DIAGNOSIS — L821 Other seborrheic keratosis: Secondary | ICD-10-CM | POA: Diagnosis not present

## 2024-03-07 DIAGNOSIS — L820 Inflamed seborrheic keratosis: Secondary | ICD-10-CM | POA: Diagnosis not present

## 2024-03-07 DIAGNOSIS — D2261 Melanocytic nevi of right upper limb, including shoulder: Secondary | ICD-10-CM | POA: Diagnosis not present

## 2024-03-07 DIAGNOSIS — C4441 Basal cell carcinoma of skin of scalp and neck: Secondary | ICD-10-CM | POA: Diagnosis not present

## 2024-03-07 DIAGNOSIS — L814 Other melanin hyperpigmentation: Secondary | ICD-10-CM | POA: Diagnosis not present

## 2024-03-07 DIAGNOSIS — D2271 Melanocytic nevi of right lower limb, including hip: Secondary | ICD-10-CM | POA: Diagnosis not present

## 2024-03-19 ENCOUNTER — Ambulatory Visit: Admitting: *Deleted

## 2024-03-19 DIAGNOSIS — M25611 Stiffness of right shoulder, not elsewhere classified: Secondary | ICD-10-CM

## 2024-03-19 DIAGNOSIS — M25511 Pain in right shoulder: Secondary | ICD-10-CM

## 2024-03-19 DIAGNOSIS — M6281 Muscle weakness (generalized): Secondary | ICD-10-CM | POA: Diagnosis not present

## 2024-03-19 NOTE — Therapy (Addendum)
 OUTPATIENT PHYSICAL THERAPY SHOULDER TREATMENT  Patient Name: Marissa Perez MRN: [id number] DOB:04/29/1946, 78 y.o., female Today's Date: 03/19/2024  END OF SESSION:  PT End of Session - 03/19/24 0934     Visit Number 14    Number of Visits 14    Date for PT Re-Evaluation 03/08/24    Authorization Type FOTO AT LEAST EVERY 5TH VISIT.  PROGRESS NOTE AT 10TH VISIT.  KX MODIFIER AFTER 15 VISITS.    PT Start Time 0930             Past Medical History:  Diagnosis Date   Acid reflux    Allergy    Arthritis    Diverticulosis    GERD (gastroesophageal reflux disease)    Heart murmur    as a child only   History of ETT 01/2009   abnormal    History of stomach ulcers 2008   positive h. pylori   Hyperlipidemia    Hypertension    Hypothyroidism    IBS (irritable bowel syndrome)    Interstitial cystitis    Skin cancer    basal and squamous   Past Surgical History:  Procedure Laterality Date   CESAREAN SECTION  08/02/1970   EYE SURGERY Bilateral 10/01/2023   cataract extraction w/ IOL   MOHS SURGERY  2005   scalp   TONSILLECTOMY  08/02/1949   TOTAL ABDOMINAL HYSTERECTOMY W/ BILATERAL SALPINGOOPHORECTOMY  08/02/1988   Dr. Claudene / fibroids    TOTAL HIP ARTHROPLASTY Right 11/16/2023   Procedure: ARTHROPLASTY, HIP, TOTAL, ANTERIOR APPROACH;  Surgeon: Melodi Lerner, MD;  Location: [facility];  Service: Orthopedics;  Laterality: Right;   Patient Active Problem List   Diagnosis Date Noted   OA (osteoarthritis) of hip 11/16/2023   Primary osteoarthritis of right hip 11/16/2023   Osteoarthritis of first carpometacarpal joint of left hand 01/15/2022   Osteoarthritis of first carpometacarpal joint of right hand 01/15/2022   Pain in right knee 08/22/2019   Osteoarthritis of multiple joints 01/25/2019   Lumbar spondylosis 12/28/2017   Degeneration of lumbar intervertebral disc 12/13/2017   Aortic atherosclerosis (HCC) 09/22/2017   Family history of breast cancer 07/05/2016   Skin  cancer 07/05/2016   Vitamin D  deficiency 07/05/2016   Hypothyroidism 12/11/2012   Hyperlipidemia 12/11/2012   Hypertension 12/11/2012   GERD (gastroesophageal reflux disease) 12/11/2012   REFERRING PROVIDER: Selinda Gosling MD  REFERRING DIAG: Subacromial bursitis of right shoulder.  THERAPY DIAG:  Acute pain of right shoulder  Stiffness of right shoulder, not elsewhere classified  Muscle weakness (generalized)  Rationale for Evaluation and Treatment: Rehabilitation  ONSET DATE: March, 2025.  SUBJECTIVE:  SUBJECTIVE STATEMENT: Some days patient states she feels better than 75%. RT shldr   PERTINENT HISTORY: See above.    PAIN:  Are you having pain? Yes: NPRS scale: 3/10. Pain location: Right shoulder. Pain description: As above. Aggravating factors: As above. Relieving factors: As above.    PRECAUTIONS: None  RED FLAGS: None   WEIGHT BEARING RESTRICTIONS: No  FALLS:  Has patient fallen in last 6 months? No  LIVING ENVIRONMENT: Lives with: lives with their spouse Lives in: House/apartment Has following equipment at home: Hurry cane.  OCCUPATION: Retired.  PLOF: Independent  PATIENT GOALS:  Use right UE without pain.  NEXT MD VISIT:   OBJECTIVE:  Note: Objective measures were completed at Evaluation unless otherwise noted.  PATIENT SURVEYS:  Quick Dash 68.18   UPPER EXTREMITY ROM:   Active right shoulder antigravity flexion to 95 degrees and painful and passive to 115 degrees.  ER actively to 45 degrees and passive to 70 degrees and behind back motion to lower lumbar region.  01/26/24:  Right active antigravity flexion to 155 degrees.    UPPER EXTREMITY MMT:  IR/ER strength tested with elbow by side is 4 to 4+/5.  Deltoid strength decreased to 3-/5 which appears  decreased at least in part due to pain.   SHOULDER SPECIAL TESTS: (-) right Drop Arm test.  Some pain reproduction with impingement testing.    PALPATION:  Tender to palpation at right acromial ridge and middle deltoid and right UT.                                                                                                                               TREATMENT DATE:  03/19/24;   UBE at 90 RPM's x 10 minutes   Combo e'stim/US  at 1.50 W/CM2 x 8 minutes to patient's right posterior cuff musculature   STW/M x 8 minutes to RT shldr  IFC at 80-150 Hz on 40% scan x 20 minutes.  Normal modality response following removal of modality   03/01/24:  UBE x 8 minutes f/b RW4 with red theraband to fatigue f/b 3# bicep curls to fatigue x 2 and 1# full can to fatigue f/b combo e'stim/U/S at 1.50 W/CM2 x 8 minutes f/b STW/M x 15 minutes f/b IFC at 80-150 Hz on 40% scan x 20 minutes.  Normal modality response following removal of modality     PATIENT EDUCATION: Education details: See below. Person educated: Patient Education method: Medical illustrator, handout. Education comprehension: verbalized understanding  HOME EXERCISE PROGRAM:   HOME EXERCISE PROGRAM [id number]  Standing Scaption full range -  Repeat 10 Repetitions, Hold 1 Second(s), Complete 2 Sets, Perform 2 Times a Day  CLINICAL IMPRESSION: Patient reporting doing much better since starting PT and feels at times 75% better. She did well with Rx and was able to meet 3/5 LTG's and is independent with HEP.    OBJECTIVE IMPAIRMENTS: decreased activity tolerance, decreased ROM, decreased strength, increased  muscle spasms, and pain.   ACTIVITY LIMITATIONS: carrying, lifting, and reach over head  PARTICIPATION LIMITATIONS: meal prep, cleaning, laundry, and yard work  [facility] POTENTIAL: Excellent  CLINICAL DECISION MAKING: Stable/uncomplicated  EVALUATION COMPLEXITY: Low   GOALS:  SHORT TERM GOALS: Target date:  01/12/24  Ind with a initial HEP. Goal status: MET.  LONG TERM GOALS: Target date: 02/09/24  Ind with an advanced HEP.  Goal status: MET  2.  Active right shoulder flexion to 145 degrees so the patient can easily reach overhead.  Goal status: 155 degrees (01/26/24).  3.  Increase right shoulder strength to a solid 4+/5 to increase stability for performance of functional activities.  Goal status: NM 4/5  4.  Perform ADL's with pain not > 3/10.  Goal status: NM    PLAN:  PT FREQUENCY: 2x/week  PT DURATION: 6 weeks  PLANNED INTERVENTIONS: 97110-Therapeutic exercises, 97530- Therapeutic activity, [id number]- Neuromuscular re-education, 97535- Self Care, [id number]- Manual therapy, G0283- Electrical stimulation (unattended), 97016- Vasopneumatic device, [id number]- Ultrasound, Patient/Family education, Cryotherapy, and Moist heat  PLAN: DC to HEP   Carnelius Hammitt,CHRIS, PTA 03/19/2024, 9:35 AM   PHYSICAL THERAPY DISCHARGE SUMMARY  Visits from Start of Care: 14.  Current functional level related to goals / functional outcomes: See above.   Remaining deficits: Subjective improvement rating of 75% and meeting 3 out of 5 LTG's.   Education / Equipment: HEP.   Patient agrees to discharge. Patient goals were partially met. Patient is being discharged due to being pleased with the current functional level.    Chad Applegate MPT

## 2024-03-22 ENCOUNTER — Other Ambulatory Visit: Payer: Self-pay | Admitting: Family Medicine

## 2024-03-22 DIAGNOSIS — I10 Essential (primary) hypertension: Secondary | ICD-10-CM

## 2024-03-22 DIAGNOSIS — K219 Gastro-esophageal reflux disease without esophagitis: Secondary | ICD-10-CM

## 2024-03-23 ENCOUNTER — Other Ambulatory Visit: Payer: Self-pay | Admitting: Family Medicine

## 2024-03-23 DIAGNOSIS — E034 Atrophy of thyroid (acquired): Secondary | ICD-10-CM

## 2024-03-28 DIAGNOSIS — M25511 Pain in right shoulder: Secondary | ICD-10-CM | POA: Diagnosis not present

## 2024-04-05 ENCOUNTER — Encounter: Payer: Self-pay | Admitting: Family Medicine

## 2024-04-05 DIAGNOSIS — M25511 Pain in right shoulder: Secondary | ICD-10-CM | POA: Diagnosis not present

## 2024-04-05 NOTE — Telephone Encounter (Signed)
 Please make sure LAB knows she has a lipid panel from January and an anemia panel/ BMP that are all future ordered. So yes, she should be fasting.

## 2024-04-09 ENCOUNTER — Other Ambulatory Visit: Payer: Medicare PPO

## 2024-04-09 DIAGNOSIS — D649 Anemia, unspecified: Secondary | ICD-10-CM

## 2024-04-09 DIAGNOSIS — I700 Atherosclerosis of aorta: Secondary | ICD-10-CM | POA: Diagnosis not present

## 2024-04-09 DIAGNOSIS — E782 Mixed hyperlipidemia: Secondary | ICD-10-CM | POA: Diagnosis not present

## 2024-04-09 DIAGNOSIS — E871 Hypo-osmolality and hyponatremia: Secondary | ICD-10-CM | POA: Diagnosis not present

## 2024-04-10 ENCOUNTER — Telehealth: Payer: Self-pay | Admitting: Gastroenterology

## 2024-04-10 ENCOUNTER — Ambulatory Visit: Payer: Medicare PPO | Admitting: Family Medicine

## 2024-04-10 ENCOUNTER — Ambulatory Visit: Payer: Self-pay | Admitting: Family Medicine

## 2024-04-10 ENCOUNTER — Encounter: Payer: Self-pay | Admitting: Family Medicine

## 2024-04-10 VITALS — BP 117/56 | HR 69 | Temp 97.5°F | Ht 61.0 in | Wt 138.5 lb

## 2024-04-10 DIAGNOSIS — M85832 Other specified disorders of bone density and structure, left forearm: Secondary | ICD-10-CM

## 2024-04-10 DIAGNOSIS — I700 Atherosclerosis of aorta: Secondary | ICD-10-CM

## 2024-04-10 DIAGNOSIS — Z23 Encounter for immunization: Secondary | ICD-10-CM | POA: Diagnosis not present

## 2024-04-10 DIAGNOSIS — Z0001 Encounter for general adult medical examination with abnormal findings: Secondary | ICD-10-CM | POA: Diagnosis not present

## 2024-04-10 DIAGNOSIS — E782 Mixed hyperlipidemia: Secondary | ICD-10-CM | POA: Diagnosis not present

## 2024-04-10 DIAGNOSIS — E871 Hypo-osmolality and hyponatremia: Secondary | ICD-10-CM | POA: Diagnosis not present

## 2024-04-10 DIAGNOSIS — I10 Essential (primary) hypertension: Secondary | ICD-10-CM

## 2024-04-10 DIAGNOSIS — E034 Atrophy of thyroid (acquired): Secondary | ICD-10-CM | POA: Diagnosis not present

## 2024-04-10 DIAGNOSIS — K219 Gastro-esophageal reflux disease without esophagitis: Secondary | ICD-10-CM | POA: Diagnosis not present

## 2024-04-10 DIAGNOSIS — Z0000 Encounter for general adult medical examination without abnormal findings: Secondary | ICD-10-CM

## 2024-04-10 LAB — ANEMIA PROFILE B
Basophils Absolute: 0.1 x10E3/uL (ref 0.0–0.2)
Basos: 1 %
EOS (ABSOLUTE): 0.1 x10E3/uL (ref 0.0–0.4)
Eos: 3 %
Ferritin: 126 ng/mL (ref 15–150)
Folate: 18.5 ng/mL (ref >3.0)
Hematocrit: 36.8 % (ref 34.0–46.6)
Hemoglobin: 12.3 g/dL (ref 11.1–15.9)
Immature Grans (Abs): 0 x10E3/uL (ref 0.0–0.1)
Immature Granulocytes: 0 %
Iron Saturation: 20 (ref 15–55)
Iron: 62 ug/dL (ref 27–139)
Lymphocytes Absolute: 1.6 x10E3/uL (ref 0.7–3.1)
Lymphs: 37 %
MCH: 30.9 pg (ref 26.6–33.0)
MCHC: 33.4 g/dL (ref 31.5–35.7)
MCV: 93 fL (ref 79–97)
Monocytes Absolute: 0.5 x10E3/uL (ref 0.1–0.9)
Monocytes: 11 %
Neutrophils Absolute: 2.2 x10E3/uL (ref 1.4–7.0)
Neutrophils: 48 %
Platelets: 342 x10E3/uL (ref 150–450)
RBC: 3.98 x10E6/uL (ref 3.77–5.28)
RDW: 12.9 % (ref 11.7–15.4)
Retic Ct Pct: 2 % (ref 0.6–2.6)
Total Iron Binding Capacity: 315 ug/dL (ref 250–450)
UIBC: 253 ug/dL (ref 118–369)
Vitamin B-12: 1118 pg/mL (ref 232–1245)
WBC: 4.4 x10E3/uL (ref 3.4–10.8)

## 2024-04-10 LAB — LIPID PANEL
Chol/HDL Ratio: 2.3 ratio (ref 0.0–4.4)
Cholesterol, Total: 153 mg/dL (ref 100–199)
HDL: 66 mg/dL (ref >39)
LDL Chol Calc (NIH): 68 mg/dL (ref 0–99)
Triglycerides: 108 mg/dL (ref 0–149)
VLDL Cholesterol Cal: 19 mg/dL (ref 5–40)

## 2024-04-10 LAB — BASIC METABOLIC PANEL WITH GFR
BUN/Creatinine Ratio: 15 (ref 12–28)
BUN: 10 mg/dL (ref 8–27)
CO2: 23 mmol/L (ref 20–29)
Calcium: 9.4 mg/dL (ref 8.7–10.3)
Chloride: 96 mmol/L (ref 96–106)
Creatinine, Ser: 0.65 mg/dL (ref 0.57–1.00)
Glucose: 87 mg/dL (ref 70–99)
Potassium: 4.7 mmol/L (ref 3.5–5.2)
Sodium: 133 mmol/L — ABNORMAL LOW (ref 134–144)
eGFR: 90 mL/min/1.73 (ref >59)

## 2024-04-10 MED ORDER — AMLODIPINE BESYLATE 5 MG PO TABS: 5.0000 mg | ORAL_TABLET | Freq: Every day | ORAL | 3 refills | Status: DC

## 2024-04-10 MED ORDER — GABAPENTIN 100 MG PO CAPS: ORAL_CAPSULE | ORAL | 3 refills | Status: AC

## 2024-04-10 MED ORDER — LOSARTAN POTASSIUM 100 MG PO TABS
100.0000 mg | ORAL_TABLET | Freq: Every day | ORAL | 3 refills | Status: AC
Start: 2024-04-10 — End: ?

## 2024-04-10 MED ORDER — ICOSAPENT ETHYL 1 G PO CAPS: 2.0000 g | ORAL_CAPSULE | Freq: Two times a day (BID) | ORAL | 3 refills | Status: AC

## 2024-04-10 MED ORDER — EZETIMIBE 10 MG PO TABS: 10.0000 mg | ORAL_TABLET | Freq: Every day | ORAL | 3 refills | Status: DC

## 2024-04-10 MED ORDER — LEVOTHYROXINE SODIUM 50 MCG PO TABS: ORAL_TABLET | ORAL | 3 refills | Status: AC

## 2024-04-10 NOTE — Telephone Encounter (Addendum)
 Good afternoon Dr. San,   Patient called stating that she wanted to schedule her colonoscopy. Looking at patients chart her last colonoscopy was in March of 2015 and was placed on a 10 year recall which would have been in March of 2025. Patient is 8. Will you please review and advise on scheduling patient?  Thank you.

## 2024-04-10 NOTE — Addendum Note (Signed)
 Addended by: JOLINDA NORENE HERO on: 04/10/2024 12:28 PM   Modules accepted: Level of Service

## 2024-04-10 NOTE — Progress Notes (Signed)
 Marissa Perez is a 79 y.o. female presents to office today for annual physical exam examination.    Discussed the use of AI scribe software for clinical note transcription with the patient, who gave verbal consent to proceed.  History of Present Illness   Marissa Perez is a 78 year old female who presents with memory concerns and increased acid reflux symptoms.  She experiences memory concerns, attributing them partly to age. She describes normal age-related memory lapses such as temporarily forgetting names or misplacing items, but denies more serious memory issues like forgetting appointments or getting lost. She reports that she struggles with background noise and finds hearing can sometimes be challenging but she doesn't feel like it's bad enough to see a hearing doctor yet.  She reports increased acid reflux symptoms despite taking omeprazole  twice daily. She occasionally forgets to take her medication but compensates by using Tums or Pepto Bismol when symptoms arise. No specific food triggers identified, though coffee may sometimes exacerbate symptoms. She has previously tried Protonix , Nexium, Pepcid , TUMS, Prevacid and Librax for her reflux.  She underwent cataract surgery but is dissatisfied with her near vision post-surgery, leading her to resume wearing glasses. She finds it inconvenient to use reading glasses frequently.  She mentions undergoing lymphatic massages for swelling in her legs, which has improved significantly. She uses compression socks as needed, especially when active, and has had two of three scheduled lymphatic massages.  She is scheduled for Mohs surgery on her head next week for a suspicious spot near a previous scar.      Occupation: retired, Marital status: Married to Marissa Perez, Substance use: none Health Maintenance Due  Topic Date Due   Colonoscopy  10/11/2023   Medicare Annual Wellness (AWV)  02/04/2024   Influenza Vaccine  03/02/2024   Refills needed  today: all  Immunization History  Administered Date(s) Administered    sv, Bivalent, Protein Subunit Rsvpref,pf [place]) 05/22/2022   Fluad Quad(high Dose 65+) 05/04/2019, 05/28/2020, 05/06/2021, 05/22/2022   Fluad Trivalent(High Dose 65+) 05/10/2023   INFLUENZA, HIGH DOSE SEASONAL PF 05/10/2017, 05/08/2018   Influenza Split 05/21/2013, 05/22/2022   Influenza, Quadrivalent, Recombinant, Inj, Pf 05/06/2016   Influenza,inj,Quad PF,6+ Mos 05/02/2015   Influenza,inj,quad, With Preservative 05/10/2014, 05/02/2017   Moderna Covid-19 Vaccine Bivalent Booster 24yrs & up 06/30/2021   Moderna Sars-Covid-2 Vaccination 03/03/2021   PFIZER(Purple Top)SARS-COV-2 Vaccination 08/22/2019, 09/10/2019, 05/28/2020, 06/22/2022   Pneumococcal Conjugate-13 08/29/2013   Pneumococcal Polysaccharide-23 01/31/2011   Td 02/10/2021   Tdap 02/03/2011   Zoster Recombinant(Shingrix ) 01/03/2017, 04/11/2017   Past Medical History:  Diagnosis Date   Acid reflux    Allergy    Arthritis    Diverticulosis    GERD (gastroesophageal reflux disease)    Heart murmur    as a child only   History of ETT 01/2009   abnormal    History of stomach ulcers 2008   positive h. 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 One grandchild   Social Drivers of Corporate investment banker Strain: Low Risk  (04/06/2024)   Overall Financial Resource Strain (CARDIA)    Difficulty of Paying Living Expenses: Not hard at all  Food Insecurity: No Food Insecurity (04/06/2024)   Hunger Vital Sign    Worried About Running Out of Food in the Last Year: Never true    Ran Out of Food in the Last Year: Never true  Transportation Needs: No Transportation Needs (04/06/2024)   PRAPARE - Administrator, Civil Service (Medical): No    Lack of Transportation (Non-Medical): No  Physical Activity: Sufficiently Active (04/06/2024)   Exercise Vital Sign    Days of Exercise per Week: 6 days    Minutes of Exercise per Session: 30 min  Stress: No Stress Concern Present (04/06/2024)   [employer] of Occupational Health - Occupational Stress Questionnaire    Feeling of Stress: Not at all  Social Connections: Socially Integrated (04/06/2024)   Social Connection and Isolation Panel    Frequency of Communication with Friends and Family: More than three times a week    Frequency of Social Gatherings with Friends and Family: Three times a week    Attends Religious Services: More than 4 times per year    Active Member of Clubs or Organizations: Yes    Attends Banker Meetings: More than 4 times per year    Marital Status: Married  Intimate Partner Violence: Patient Declined (11/16/2023)   Humiliation, Afraid, Rape, and Kick questionnaire    Fear of Current or Ex-Partner: Patient declined    Emotionally Abused: Patient declined    Physically Abused: Patient declined    Sexually Abused: Patient declined   Past Surgical History:  Procedure Laterality Date   CESAREAN SECTION  08/02/1970   EYE SURGERY Bilateral 10/01/2023   cataract extraction w/ IOL   MOHS SURGERY   2005   scalp   TONSILLECTOMY  08/02/1949   TOTAL ABDOMINAL HYSTERECTOMY W/ BILATERAL SALPINGOOPHORECTOMY  08/02/1988   Dr. Claudene / fibroids    TOTAL HIP ARTHROPLASTY Right 11/16/2023   Procedure: ARTHROPLASTY, HIP, TOTAL, ANTERIOR APPROACH;  Surgeon: Melodi Lerner, MD;  Location: [facility];  Service: Orthopedics;  Laterality: Right;   Family History  Problem Relation Age of Onset   Diabetes Father    Heart disease Father    Heart attack Father 26   Breast cancer Mother    Breast cancer Paternal Grandmother        Aunt also    Breast cancer Other        Family History   Breast cancer Maternal Aunt    Breast cancer Paternal Aunt    Colon cancer Neg Hx    Esophageal cancer Neg Hx    Rectal cancer Neg Hx    Stomach cancer Neg Hx     Current Outpatient Medications:    atorvastatin  (LIPITOR) 20 MG tablet, Take 1 tablet (20 mg total) by mouth daily.,  Disp: 90 tablet, Rfl: 3   Calcium -Magnesium  (CAL/MAG PO), Take 1 capsule by mouth in the morning and at bedtime.  Calcium  & Magnesium  Mineral Complex 750 mg, Disp: , Rfl:    carboxymethylcellulose (REFRESH PLUS) 0.5 % SOLN, 1-2 drops 3 (three) times daily as needed (dry/irritated eyes.)., Disp: , Rfl:    Cholecalciferol (VITAMIN D -3) 125 MCG (5000 UT) TABS, Take 5,000 Units by mouth in the morning., Disp: , Rfl:    clobetasol (TEMOVATE) 0.05 % external solution, Apply 1 application  topically daily as needed (scalp irritation)., Disp: , Rfl:    cyanocobalamin (VITAMIN B12) 1000 MCG tablet, Take 10,000 mcg by mouth in the morning., Disp: , Rfl:    diclofenac  Sodium (VOLTAREN ) 1 % GEL, Apply 4 g topically 4 (four) times daily as needed (pain.)., Disp: , Rfl:    Glucosamine-Chondroitin (COSAMIN DS PO), Take 1 tablet by mouth in the morning and at bedtime., Disp: , Rfl:    omeprazole  (PRILOSEC) 20 MG capsule, TAKE ONE CAPSULE BY MOUTH TWICE DAILY AS NEEDED, Disp: 180 capsule, Rfl: 0   Probiotic Product (PROBIOTIC DAILY PO), Take 1 capsule by mouth  at bedtime., Disp: , Rfl:    ZINC GLUCONATE PO, Take 50 mg by mouth in the morning., Disp: , Rfl:    amLODipine  (NORVASC ) 5 MG tablet, Take 1 tablet (5 mg total) by mouth daily. With losartan  for BP, Disp: 90 tablet, Rfl: 3   ezetimibe  (ZETIA ) 10 MG tablet, Take 1 tablet (10 mg total) by mouth daily., Disp: 90 tablet, Rfl: 3   gabapentin  (NEURONTIN ) 100 MG capsule, TAKE 1 CAPSULE EVERY MORNING AND 2 CAPSULES AT BEDTIME, Disp: 270 capsule, Rfl: 3   icosapent  Ethyl (VASCEPA ) 1 g capsule, Take 2 capsules (2 g total) by mouth 2 (two) times daily., Disp: 360 capsule, Rfl: 3   levothyroxine  (SYNTHROID ) 50 MCG tablet, TAKE 1/2 TABLET ON MONDAY, WEDNESDAY, AND FRIDAY, AND TAKE ONE TABLET ON tuesday AND thursday, Disp: 90 tablet, Rfl: 3   losartan  (COZAAR ) 100 MG tablet, Take 1 tablet (100 mg total) by mouth daily., Disp: 90 tablet, Rfl: 3  Allergies  Allergen Reactions   Codeine     Headache     Femring [Estradiol] Rash   Cephalexin Rash   Erythromycin Other (See Comments)    Gi  Upset     Penicillins Rash     ROS: Review of Systems Pertinent items noted in HPI and remainder of comprehensive ROS otherwise negative.    Physical exam BP (!) 117/56   Pulse 69   Temp (!) 97.5 F (36.4 C)   Ht 5' 1 (1.549 m)   Wt 138 lb 8 oz (62.8 kg)   SpO2 98%   BMI 26.17 kg/m  General appearance: alert, cooperative, appears stated age, and no distress Head: Normocephalic, without obvious abnormality, atraumatic Eyes: negative findings: lids and lashes normal, conjunctivae and sclerae normal, corneas clear, and pupils equal, round, reactive to light and accomodation Ears: normal TM's and external ear canals both ears Nose: Nares normal. Septum midline. Mucosa normal. No drainage or sinus tenderness. Throat: lips, mucosa, and tongue normal; teeth and gums normal Neck: no adenopathy, no carotid bruit, supple, symmetrical, trachea midline, and thyroid  not enlarged, symmetric, no  tenderness/mass/nodules Back: symmetric, no curvature. ROM normal. No CVA tenderness. Lungs: clear to auscultation bilaterally Heart: regular rate and rhythm, S1, S2 normal, no murmur, click, rub or gallop Abdomen: soft, non-tender; bowel sounds normal; no masses,  no organomegaly Extremities: extremities normal, atraumatic,  no cyanosis or edema Pulses: 2+ and symmetric Skin: alopecia, skin lesions of scalp Lymph nodes: Cervical, supraclavicular, and axillary nodes normal. Neurologic: Alert and oriented X 3, normal strength and tone. Normal symmetric reflexes. Normal coordination and gait      01/03/2024   10:15 AM 09/26/2023   10:11 AM 08/19/2023   11:01 AM  Depression screen PHQ 2/9  Decreased Interest 0 0 0  Down, Depressed, Hopeless 0 0 0  PHQ - 2 Score 0 0 0  Altered sleeping 0 0 0  Tired, decreased energy 0 0 0  Change in appetite 0 0 0  Feeling bad or failure about yourself  0 0 0  Trouble concentrating 0 0 0  Moving slowly or fidgety/restless 0 0 0  Suicidal thoughts 0 0 0  PHQ-9 Score 0 0 0  Difficult doing work/chores Not difficult at all Not difficult at all Not difficult at all      01/03/2024   10:15 AM 09/26/2023   10:11 AM 08/19/2023   11:01 AM 06/15/2023    3:48 PM  GAD 7 : Generalized Anxiety Score  Nervous, Anxious, on Edge 0 0 0 1  Control/stop worrying 0 0 0 0  Worry too much - different things 0 0 0 1  Trouble relaxing 0 0 0 1  Restless 0 0 0 0  Easily annoyed or irritable 0 0 0 0  Afraid - awful might happen 0 0 0 1  Total GAD 7 Score 0 0 0 4  Anxiety Difficulty Not difficult at all Not difficult at all  Not difficult at all   Results for orders placed or performed in visit on 04/09/24 (from the past 72 hours)  Anemia Profile B     Status: None   Collection Time: 04/09/24  8:47 AM  Result Value Ref Range   Total Iron Binding Capacity 315 250 - 450 ug/dL   UIBC 746 881 - 630 ug/dL   Iron 62 27 - 860 ug/dL   Iron Saturation 20 15 - 55 %   Ferritin  126 15 - 150 ng/mL   Vitamin B-12 1,118 232 - 1,245 pg/mL   Folate 18.5 >3.0 ng/mL    Comment: A serum folate concentration of less than 3.1 ng/mL is considered to represent clinical deficiency.    WBC 4.4 3.4 - 10.8 x10E3/uL   RBC 3.98 3.77 - 5.28 x10E6/uL   Hemoglobin 12.3 11.1 - 15.9 g/dL   Hematocrit 63.1 65.9 - 46.6 %   MCV 93 79 - 97 fL   MCH 30.9 26.6 - 33.0 pg   MCHC 33.4 31.5 - 35.7 g/dL   RDW 87.0 88.2 - 84.5 %   Platelets 342 150 - 450 x10E3/uL   Neutrophils 48 Not Estab. %   Lymphs 37 Not Estab. %   Monocytes 11 Not Estab. %   Eos 3 Not Estab. %   Basos 1 Not Estab. %   Neutrophils Absolute 2.2 1.4 - 7.0 x10E3/uL   Lymphocytes Absolute 1.6 0.7 - 3.1 x10E3/uL   Monocytes Absolute 0.5 0.1 - 0.9 x10E3/uL   EOS (ABSOLUTE) 0.1 0.0 - 0.4 x10E3/uL   Basophils Absolute 0.1 0.0 - 0.2 x10E3/uL   Immature Granulocytes 0 Not Estab. %   Immature Grans (Abs) 0.0 0.0 - 0.1 x10E3/uL   Retic Ct Pct 2.0 0.6 - 2.6 %  Lipid panel     Status: None   Collection Time: 04/09/24  8:47 AM  Result Value Ref Range   Cholesterol,  Total 153 100 - 199 mg/dL   Triglycerides 891 0 - 149 mg/dL   HDL 66 >60 mg/dL   VLDL Cholesterol Cal 19 5 - 40 mg/dL   LDL Chol Calc (NIH) 68 0 - 99 mg/dL   Chol/HDL Ratio 2.3 0.0 - 4.4 ratio    Comment:                                   T. Chol/HDL Ratio                                             Men  Women                               1/2 Avg.Risk  3.4    3.3                                   Avg.Risk  5.0    4.4                                2X Avg.Risk  9.6    7.1                                3X Avg.Risk 23.4   11.0   Basic metabolic panel with GFR     Status: Abnormal   Collection Time: 04/09/24  8:47 AM  Result Value Ref Range   Glucose 87 70 - 99 mg/dL   BUN 10 8 - 27 mg/dL   Creatinine, Ser 9.34 0.57 - 1.00 mg/dL   eGFR 90 >40 fO/fpw/8.26   BUN/Creatinine Ratio 15 12 - 28   Sodium 133 (L) 134 - 144 mmol/L   Potassium 4.7 3.5 - 5.2 mmol/L    Chloride 96 96 - 106 mmol/L   CO2 23 20 - 29 mmol/L   Calcium  9.4 8.7 - 10.3 mg/dL   Assessment/ Plan: Merlynn JON[place]LE [place] here for annual physical exam.   Annual physical exam  Gastroesophageal reflux disease without esophagitis  Hyponatremia - Plan: Basic metabolic panel with GFR  Primary hypertension - Plan: amLODipine  (NORVASC ) 5 MG tablet, losartan  (COZAAR ) 100 MG tablet  Mixed hyperlipidemia - Plan: ezetimibe  (ZETIA ) 10 MG tablet, icosapent  Ethyl (VASCEPA ) 1 g capsule  Aortic atherosclerosis (HCC) - Plan: ezetimibe  (ZETIA ) 10 MG tablet, icosapent  Ethyl (VASCEPA ) 1 g capsule  Osteopenia of left forearm - Plan: DG [facility] DEXA  Hypothyroidism due to acquired atrophy of thyroid  - Plan: levothyroxine  (SYNTHROID ) 50 MCG tablet  Assessment and Plan    Adult Wellness Visit Routine wellness visit focused on cognitive health. Hearing aids used to mitigate cognitive impairment risk. - Labs reviewed. - Consider hearing aid evaluation to reduce cognitive decline risk. - Maintain a balanced diet and regular exercise. - Encourage social interaction to support cognitive health. - Avoid medications associated with cognitive decline, such as benzodiazepines and older antihistamines. - She will reach out to GI for colon cancer screening - Written COVID rx given  Gastroesophageal reflux disease (GERD) Increased reflux symptoms despite current omeprazole  regimen.  No blood in stool, vomiting, or specific food triggers identified. - Try taking omeprazole  as a single dose instead of split doses. - Consider prescribing 40 mg omeprazole  if single dosing is effective. - Monitor symptoms and communicate via MyChart. - Consider alternative medications like Dexilant or Voquezna if symptoms persist.  HTN w/ Edema of lower extremities Edema improved with compression socks and lymphatic massages. - Continue using compression socks as needed. - Continue lymphatic massages as scheduled.  Mild  hyponatremia Slightly low sodium level, possibly due to hydration status. - Recheck sodium level during next potassium recheck.      Counseled on healthy lifestyle choices, including diet (rich in fruits, vegetables and lean meats and low in salt and simple carbohydrates) and exercise (at least 30 minutes of moderate physical activity daily).  Patient to follow up 103m  Enyla Lisbon M. Jolinda, DO

## 2024-04-11 NOTE — Telephone Encounter (Signed)
 Chart reviewed and was last seen by me in 2024.  If patient would like to proceed with repeat colonoscopy now for ongoing CRC screening, if no GI symptoms, ok to schedule for direct access colonoscopy for ongoing screening.  If having GI symptoms, recommend OV first.  Thanks.

## 2024-04-13 ENCOUNTER — Encounter: Payer: Self-pay | Admitting: Gastroenterology

## 2024-04-13 NOTE — Telephone Encounter (Signed)
 Patient has been scheduled for colon on 11/7 at 10:00 and PV on 10/24 at 10:00

## 2024-04-15 ENCOUNTER — Encounter: Payer: Self-pay | Admitting: Family Medicine

## 2024-04-16 ENCOUNTER — Other Ambulatory Visit: Payer: Self-pay | Admitting: Family Medicine

## 2024-04-16 ENCOUNTER — Other Ambulatory Visit

## 2024-04-16 DIAGNOSIS — E871 Hypo-osmolality and hyponatremia: Secondary | ICD-10-CM | POA: Diagnosis not present

## 2024-04-16 DIAGNOSIS — K219 Gastro-esophageal reflux disease without esophagitis: Secondary | ICD-10-CM

## 2024-04-16 LAB — BASIC METABOLIC PANEL WITH GFR
BUN/Creatinine Ratio: 17 (ref 12–28)
BUN: 11 mg/dL (ref 8–27)
CO2: 22 mmol/L (ref 20–29)
Calcium: 9.2 mg/dL (ref 8.7–10.3)
Chloride: 98 mmol/L (ref 96–106)
Creatinine, Ser: 0.65 mg/dL (ref 0.57–1.00)
Glucose: 94 mg/dL (ref 70–99)
Potassium: 4.9 mmol/L (ref 3.5–5.2)
Sodium: 135 mmol/L (ref 134–144)
eGFR: 90 mL/min/1.73 (ref >59)

## 2024-04-16 MED ORDER — OMEPRAZOLE 40 MG PO CPDR: 40.0000 mg | DELAYED_RELEASE_CAPSULE | Freq: Every day | ORAL | 4 refills | Status: AC

## 2024-04-17 ENCOUNTER — Ambulatory Visit: Payer: Self-pay | Admitting: Family Medicine

## 2024-04-18 DIAGNOSIS — C4441 Basal cell carcinoma of skin of scalp and neck: Secondary | ICD-10-CM | POA: Diagnosis not present

## 2024-04-18 DIAGNOSIS — M952 Other acquired deformity of head: Secondary | ICD-10-CM

## 2024-04-18 DIAGNOSIS — Z85828 Personal history of other malignant neoplasm of skin: Secondary | ICD-10-CM | POA: Diagnosis not present

## 2024-04-18 HISTORY — DX: Other acquired deformity of head: M95.2

## 2024-05-10 DIAGNOSIS — D3131 Benign neoplasm of right choroid: Secondary | ICD-10-CM | POA: Diagnosis not present

## 2024-05-10 DIAGNOSIS — H02831 Dermatochalasis of right upper eyelid: Secondary | ICD-10-CM | POA: Diagnosis not present

## 2024-05-10 DIAGNOSIS — H04123 Dry eye syndrome of bilateral lacrimal glands: Secondary | ICD-10-CM | POA: Diagnosis not present

## 2024-05-10 DIAGNOSIS — Z961 Presence of intraocular lens: Secondary | ICD-10-CM | POA: Diagnosis not present

## 2024-05-10 DIAGNOSIS — H353131 Nonexudative age-related macular degeneration, bilateral, early dry stage: Secondary | ICD-10-CM | POA: Diagnosis not present

## 2024-05-10 DIAGNOSIS — H02834 Dermatochalasis of left upper eyelid: Secondary | ICD-10-CM | POA: Diagnosis not present

## 2024-05-11 DIAGNOSIS — Z471 Aftercare following joint replacement surgery: Secondary | ICD-10-CM | POA: Diagnosis not present

## 2024-05-11 DIAGNOSIS — Z96641 Presence of right artificial hip joint: Secondary | ICD-10-CM | POA: Diagnosis not present

## 2024-05-15 ENCOUNTER — Ambulatory Visit (INDEPENDENT_AMBULATORY_CARE_PROVIDER_SITE_OTHER)

## 2024-05-15 VITALS — BP 117/56 | HR 69 | Ht 61.0 in | Wt 138.0 lb

## 2024-05-15 DIAGNOSIS — Z0000 Encounter for general adult medical examination without abnormal findings: Secondary | ICD-10-CM | POA: Diagnosis not present

## 2024-05-15 NOTE — Progress Notes (Signed)
 Subjective:   Marissa Perez is a 78 y.o. who presents for a Medicare Wellness preventive visit.  As a reminder, Annual Wellness Visits don't include a physical exam, and some assessments may be limited, especially if this visit is performed virtually. We may recommend an in-person follow-up visit with your provider if needed.  Visit Complete: Virtual I connected with  Marissa Perez on 05/15/24 by a audio enabled telemedicine application and verified that I am speaking with the correct person using two identifiers.  Patient Location: Home  Provider Location: Home Office  I discussed the limitations of evaluation and management by telemedicine. The patient expressed understanding and agreed to proceed.  Vital Signs: Because this visit was a virtual/telehealth visit, some criteria may be missing or patient reported. Any vitals not documented were not able to be obtained and vitals that have been documented are patient reported.  VideoDeclined- This patient declined Librarian, academic. Therefore the visit was completed with audio only.  Persons Participating in Visit: Patient.  AWV Questionnaire: Yes: Patient Medicare AWV questionnaire was completed by the patient on 101/10/25; I have confirmed that all information answered by patient is correct and no changes since this date.        Objective:    There were no vitals filed for this visit. There is no height or weight on file to calculate BMI.     05/15/2024    2:08 PM 12/29/2023   11:23 AM 11/16/2023    2:00 PM 11/07/2023   11:26 AM 02/04/2023    1:35 PM 07/21/2022   12:42 PM 01/29/2022    1:21 PM  Advanced Directives  Does Patient Have a Medical Advance Directive? Yes Yes Yes Yes Yes Yes Yes  Type of Estate agent of [place];Living will  Living will  Healthcare Power of [place];Living will  Healthcare Power of [place];Living will  Does patient want to make changes to medical  advance directive?   No - Patient declined No - Patient declined No - Patient declined    Copy of Healthcare Power of Attorney in Chart? Yes - validated most recent copy scanned in chart (See row information)    Yes - validated most recent copy scanned in chart (See row information)  Yes - validated most recent copy scanned in chart (See row information)    Current Medications (verified) Outpatient Encounter Medications as of 05/15/2024  Medication Sig   amLODipine  (NORVASC ) 5 MG tablet Take 1 tablet (5 mg total) by mouth daily. With losartan  for BP   atorvastatin  (LIPITOR) 20 MG tablet Take 1 tablet (20 mg total) by mouth daily.   Calcium -Magnesium  (CAL/MAG PO) Take 1 capsule by mouth in the morning and at bedtime.  Calcium  & Magnesium  Mineral Complex 750 mg   carboxymethylcellulose (REFRESH PLUS) 0.5 % SOLN 1-2 drops 3 (three) times daily as needed (dry/irritated eyes.).   Cholecalciferol (VITAMIN D -3) 125 MCG (5000 UT) TABS Take 5,000 Units by mouth in the morning.   clobetasol (TEMOVATE) 0.05 % external solution Apply 1 application  topically daily as needed (scalp irritation).   cyanocobalamin (VITAMIN B12) 1000 MCG tablet Take 10,000 mcg by mouth in the morning.   diclofenac  Sodium (VOLTAREN ) 1 % GEL Apply 4 g topically 4 (four) times daily as needed (pain.).   ezetimibe  (ZETIA ) 10 MG tablet Take 1 tablet (10 mg total) by mouth daily.   gabapentin  (NEURONTIN ) 100 MG capsule TAKE 1 CAPSULE EVERY MORNING AND 2 CAPSULES AT BEDTIME  Glucosamine-Chondroitin (COSAMIN DS PO) Take 1 tablet by mouth in the morning and at bedtime.   icosapent  Ethyl (VASCEPA ) 1 g capsule Take 2 capsules (2 g total) by mouth 2 (two) times daily.   levothyroxine  (SYNTHROID ) 50 MCG tablet TAKE 1/2 TABLET ON MONDAY, WEDNESDAY, AND FRIDAY, AND TAKE ONE TABLET ON tuesday AND thursday   losartan  (COZAAR ) 100 MG tablet Take 1 tablet (100 mg total) by mouth daily.   omeprazole  (PRILOSEC) 40 MG capsule Take 1 capsule (40 mg  total) by mouth daily.   predniSONE  (DELTASONE ) 10 MG tablet Take 10 mg by mouth daily with breakfast.   Probiotic Product (PROBIOTIC DAILY PO) Take 1 capsule by mouth at bedtime.   ZINC GLUCONATE PO Take 50 mg by mouth in the morning.   [DISCONTINUED] meloxicam  (MOBIC ) 15 MG tablet TAKE ONE TABLET DAILY AS NEEDED FOR PAIN   No facility-administered encounter medications on file as of 05/15/2024.    Allergies (verified) Codeine, Femring [estradiol], Cephalexin, Erythromycin, and Penicillins   History: Past Medical History:  Diagnosis Date   Acid reflux    Allergy    Arthritis    Cataract    Removed 09/2023, 10/2023   Diverticulosis    GERD (gastroesophageal reflux disease)    Heart murmur    as a child only   History of ETT 01/2009   abnormal    History of stomach ulcers 2008   positive h. 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 Claudene / fibroids    TOTAL HIP ARTHROPLASTY Right 11/16/2023   Procedure: ARTHROPLASTY, HIP, TOTAL, ANTERIOR APPROACH;  Surgeon: Melodi Lerner, MD;  Location: [facility];  Service: Orthopedics;  Laterality: Right;   Family History  Problem Relation Age of Onset   Diabetes Father    Heart disease Father    Heart attack Father 66   Arthritis Father    Breast cancer Mother    Cancer Mother    Breast cancer Paternal Grandmother        Aunt also    Cancer Paternal Grandmother    Breast cancer Other        Family History   Breast cancer  Maternal Aunt    Cancer Maternal Aunt    Breast cancer Paternal Aunt    Cancer Paternal Aunt    Cancer Paternal Uncle    Learning disabilities Paternal Uncle    Colon cancer Neg Hx    Esophageal cancer Neg Hx    Rectal cancer Neg Hx    Stomach cancer Neg Hx    Social History   Socioeconomic History   Marital status: Married    Spouse name: [name]   Number of children: 1   Years of education: 16   Highest education level: Bachelor's degree (e.g., BA, AB, BS)  Occupational History   Occupation: Retired    Associate Professor: [employer]    Comment: Runner, broadcasting/film/video  Tobacco Use   Smoking status: Never   Smokeless tobacco: Never  Vaping Use   Vaping status: Never Used  Substance and Sexual Activity   Alcohol use: Yes    Alcohol/week: 7.0 standard drinks of alcohol    Types: 7 Glasses of wine per week    Comment: 1 daily   Drug use: No   Sexual activity: Yes    Birth control/protection: Post-menopausal, None  Other Topics Concern   Not on file  Social History Narrative   Lives home with her husband Marissa Perez Their son lives in [place]. One grandchild   Social Drivers of Corporate investment banker Strain: Low Risk  (05/11/2024)   Overall Financial Resource Strain (CARDIA)    Difficulty of Paying Living Expenses: Not hard at all  Food Insecurity: No Food Insecurity (05/15/2024)   Hunger Vital Sign    Worried About Running Out of Food in the Last Year: Never true    Ran Out of Food in the Last Year: Never true  Transportation Needs: No Transportation Needs (05/11/2024)   PRAPARE - Administrator, Civil Service (Medical): No    Lack of Transportation (Non-Medical): No  Physical Activity: Sufficiently Active (05/11/2024)   Exercise Vital Sign    Days of Exercise per Week: 6 days    Minutes of Exercise per Session: 40 min  Stress: No Stress Concern Present (05/11/2024)   [employer] of Occupational Health - Occupational Stress Questionnaire    Feeling of  Stress: Not at all  Social Connections: Socially Integrated (05/11/2024)   Social Connection and Isolation Panel    Frequency of Communication with Friends and Family: More than three times a week    Frequency of Social Gatherings with Friends and Family: Three times a week    Attends Religious Services: More than 4 times per year    Active Member of Clubs or Organizations: Yes    Attends Engineer, structural: More than 4 times per year    Marital Status: Married    Tobacco Counseling Counseling given: Not Answered    Clinical Intake:  Pre-visit preparation completed: Yes  Pain : No/denies pain     Nutritional Risks: None Diabetes: No  No results found for: HGBA1C   How often do you need to have someone help you when you read instructions, pamphlets, or other written materials from your doctor or pharmacy?: 1 - Never  Interpreter Needed?: No  Information entered by :: alia t/cma   Activities of Daily Living     05/11/2024    8:35 PM 11/16/2023    2:00 PM  In your present state of health, do you have any difficulty performing the following activities:  Hearing? 0 0  Vision? 0 1  Difficulty concentrating or making decisions? 0 0  Walking or climbing stairs? 0   Dressing or bathing? 0   Doing errands, shopping? 0 0  Preparing Food and eating ? N   Using the Toilet? N   In the past six months, have you accidently leaked urine? N   Do you have problems with loss of bowel control? N   Managing your Medications? N   Managing your Finances? N   Housekeeping or managing your Housekeeping? N     Patient Care Team: Jolinda Norene HERO, DO as PCP - General (Family Medicine) Debrah Lamar BIRCH, MD (Inactive) as Consulting Physician (Gastroenterology) Octavia, Charlie Hamilton, MD as Consulting Physician (Ophthalmology) [place]or GAILS, DO as Consulting Physician (Gastroenterology)  Jalene Francis DEL as Physician Assistant (Chiropractic Medicine) Lynnell Nottingham, MD as Consulting Physician (Dermatology)  I have updated your Care Teams any recent Medical Services you may have received from other providers in the past year.     Assessment:   This is a routine wellness examination for Marissa Perez.  Hearing/Vision screen Hearing Screening - Comments:: Pt denies hearing dif Vision Screening - Comments:: Pt wear glasses/pt Dr. Octavia in [place], [place]/last 2025   Goals Addressed             This Visit's Progress    Exercise 150 min/wk Moderate Activity   On track    Walking daily       Depression Screen     05/15/2024    2:10 PM 04/10/2024    4:23 PM 01/03/2024   10:15 AM 09/26/2023   10:11 AM 08/19/2023   11:01 AM 06/15/2023    3:48 PM 06/15/2023    3:47 PM  PHQ 2/9 Scores  PHQ - 2 Score 0  0 0 0 0 0  PHQ- 9 Score   0 0 0 0   Exception Documentation  Patient refusal         Fall Risk     05/15/2024    2:06 PM 05/11/2024    8:35 PM 04/10/2024    4:23 PM 01/03/2024   10:15 AM 09/26/2023   10:11 AM  Fall Risk   Falls in the past year? 0 0 0 0 0  Number falls in past yr: 0 0  0 0  Injury with Fall? 0   0 0  Risk for fall due to : No Fall Risks   No Fall Risks No Fall Risks  Follow up Falls evaluation completed;Education provided  Falls evaluation completed Falls evaluation completed Falls evaluation completed    MEDICARE RISK AT HOME:  Medicare Risk at Home Any stairs in or around the home?: (Patient-Rptd) Yes If so, are there any without handrails?: (Patient-Rptd) No Home free of loose throw rugs in walkways, pet beds, electrical cords, etc?: (Patient-Rptd) Yes Adequate lighting in your home to reduce risk of falls?: (Patient-Rptd) Yes Life alert?: (Patient-Rptd) No Use of a cane, walker or w/c?: (Patient-Rptd) No Grab bars in the bathroom?: (Patient-Rptd) Yes Shower chair or bench in shower?: (Patient-Rptd) Yes Elevated toilet seat or a handicapped toilet?: (Patient-Rptd) Yes  TIMED UP AND GO:  Was the test performed?   no  Cognitive Function: 6CIT completed    01/10/2018   10:56 AM 01/03/2017   11:32 AM 12/12/2015   12:41 PM  MMSE - Mini Mental State Exam  Orientation to time 5 5  5    Orientation to Place 5 5  5    Registration 3 3  3    Attention/ Calculation 5 5  5    Recall 3 3  3    Language- name 2 objects 2 2  2    Language- repeat 1 1 1   Language- follow 3 step command 3 3  3    Language- read & follow direction 1 1  1    Write a sentence 1 1  1    Copy design 1 1  1    Total score 30 30  30       Data saved with a previous flowsheet row definition        05/15/2024    2:08 PM 02/04/2023    1:36 PM 01/29/2022    1:21 PM 01/28/2020    1:22 PM 01/12/2019    9:45 AM  6CIT  Screen  What Year? 0 points 0 points 0 points 0 points 0 points  What month? 0 points 0 points 0 points 0 points 0 points  What time? 0 points 0 points 0 points 0 points 0 points  Count back from 20 0 points 0 points 0 points 0 points 0 points  Months in reverse 0 points 0 points 0 points 0 points 0 points  Repeat phrase 0 points 0 points 0 points 0 points 0 points  Total Score 0 points 0 points 0 points 0 points 0 points    Immunizations Immunization History  Administered Date(s) Administered    sv, Bivalent, Protein Subunit Rsvpref,pf (Abrysvo) 05/22/2022   Fluad Quad(high Dose 65+) 05/04/2019, 05/28/2020, 05/06/2021, 05/22/2022   Fluad Trivalent(High Dose 65+) 05/10/2023   INFLUENZA, HIGH DOSE SEASONAL PF 05/10/2017, 05/08/2018, 04/10/2024   Influenza Split 05/21/2013, 05/22/2022   Influenza, Quadrivalent, Recombinant, Inj, Pf 05/06/2016   Influenza,inj,Quad PF,6+ Mos 05/02/2015   Influenza,inj,quad, With Preservative 05/10/2014, 05/02/2017   Moderna Covid-19 Fall Seasonal Vaccine 36yrs & older 04/19/2024   Moderna Covid-19 Vaccine Bivalent Booster 19yrs & up 06/30/2021   Moderna Sars-Covid-2 Vaccination 03/03/2021   PFIZER(Purple Top)SARS-COV-2 Vaccination 08/22/2019, 09/10/2019, 05/28/2020, 06/22/2022   Pneumococcal  Conjugate-13 08/29/2013   Pneumococcal Polysaccharide-23 01/31/2011   Td 02/10/2021   Tdap 02/03/2011   Zoster Recombinant(Shingrix ) 01/03/2017, 04/11/2017    Screening Tests Health Maintenance  Topic Date Due   Colonoscopy  10/11/2023   Mammogram  06/08/2024   DEXA SCAN  09/06/2024   COVID-19 Vaccine (8 - Pfizer risk 2025-26 season) 10/17/2024   Medicare Annual Wellness (AWV)  05/15/2025   DTaP/Tdap/Td (3 - Td or Tdap) 02/11/2031   Pneumococcal Vaccine: 50+ Years  Completed   Influenza Vaccine  Completed   Hepatitis C Screening  Completed   Zoster Vaccines- Shingrix   Completed   Meningococcal B Vaccine  Aged Out    Health Maintenance Items Addressed: Mammogram scheduled and Colonoscopy scheduled  Additional Screening:  Vision Screening: Recommended annual ophthalmology exams for early detection of glaucoma and other disorders of the eye. Is the patient up to date with their annual eye exam?  Yes  Who is the provider or what is the name of the office in which the patient attends annual eye exams? Dr. Octavia in [place]  Dental Screening: Recommended annual dental exams for proper oral hygiene  Community Resource Referral / Chronic Care Management: CRR required this visit?  No   CCM required this visit?  No   Plan:    I have personally reviewed and noted the following in the patient's chart:   Medical and social history Use of alcohol, tobacco or illicit drugs  Current medications and supplements including opioid prescriptions. Patient is not currently taking opioid prescriptions. Functional ability and status Nutritional status Physical activity Advanced directives List of other physicians Hospitalizations, surgeries, and ER visits in previous 12 months Vitals Screenings to include cognitive, depression, and falls Referrals and appointments  In addition, I have reviewed and discussed with patient certain preventive protocols, quality metrics, and best  practice recommendations. A written personalized care plan for preventive services as well as general preventive health recommendations were provided to patient.   Ozie Ned, CMA   05/15/2024   After Visit Summary: (MyChart) Due to this being a telephonic visit, the after visit summary with patients personalized plan was offered to patient via MyChart   Notes: Nothing significant to report at this time.

## 2024-05-25 ENCOUNTER — Ambulatory Visit (AMBULATORY_SURGERY_CENTER): Admitting: *Deleted

## 2024-05-25 VITALS — Ht 61.0 in | Wt 135.0 lb

## 2024-05-25 DIAGNOSIS — Z1211 Encounter for screening for malignant neoplasm of colon: Secondary | ICD-10-CM

## 2024-05-25 MED ORDER — NA SULFATE-K SULFATE-MG SULF 17.5-3.13-1.6 GM/177ML PO SOLN: 1.0000 | Freq: Once | ORAL | 0 refills | Status: AC

## 2024-05-25 NOTE — Progress Notes (Signed)
 Pt's name and DOB verified at the beginning of the pre-visit with 2 identifiers   Pt denies any difficulty with ambulating,sitting, laying down or rolling side to side  Pt has no issues moving head neck or swallowing  No egg or soy allergy known to patient   No issues known to pt with past sedation  No FH of Malignant Hyperthermia  Pt is not on home 02   Pt is not on blood thinners   Pt denies issues with constipation   Pt is not on dialysis  Pt heart murmur  Pt denies any upcoming cardiac testing  Patient's chart reviewed by Norleen Schillings CNRA prior to pre-visit and patient appropriate for the [facility].  Pre-visit completed and red dot placed by patient's name on their procedure day (on provider's schedule).     Visit by phone  Pt states weight is 135 lb  Pt given  both [facility] main # and MD on call # prior to instructions.  Informed pt to come in at the time discussed and is shown on PV instructions.  Pt instructed to use Singlecare.com or GoodRx for a price reduction on prep  Instructed pt where to find PV instructions in My Ch. Copy of instructions  to be sent in mail and address read back to pt to verify correct on envelope. Instructed pt on all aspects of written instructions including med holds clothing to wear and foods to eat and not eat as well as after procedure legal restrictions and to call MD on call if needed.. Pt states understanding. Instructed pt to review instructions again prior to procedure and call main # given if has any questions or any issues. Pt states they will.

## 2024-05-31 ENCOUNTER — Other Ambulatory Visit: Payer: Self-pay | Admitting: Family Medicine

## 2024-05-31 DIAGNOSIS — Z1231 Encounter for screening mammogram for malignant neoplasm of breast: Secondary | ICD-10-CM

## 2024-06-01 ENCOUNTER — Encounter: Payer: Self-pay | Admitting: Gastroenterology

## 2024-06-03 ENCOUNTER — Encounter: Payer: Self-pay | Admitting: Gastroenterology

## 2024-06-04 ENCOUNTER — Encounter: Payer: Self-pay | Admitting: Family Medicine

## 2024-06-06 ENCOUNTER — Other Ambulatory Visit: Payer: Self-pay | Admitting: *Deleted

## 2024-06-08 ENCOUNTER — Encounter: Payer: Self-pay | Admitting: Gastroenterology

## 2024-06-08 ENCOUNTER — Ambulatory Visit: Admitting: Gastroenterology

## 2024-06-08 VITALS — BP 107/85 | HR 74 | Temp 97.2°F | Resp 13 | Ht 61.0 in | Wt 135.0 lb

## 2024-06-08 DIAGNOSIS — E039 Hypothyroidism, unspecified: Secondary | ICD-10-CM | POA: Diagnosis not present

## 2024-06-08 DIAGNOSIS — Z1211 Encounter for screening for malignant neoplasm of colon: Secondary | ICD-10-CM

## 2024-06-08 DIAGNOSIS — K640 First degree hemorrhoids: Secondary | ICD-10-CM

## 2024-06-08 DIAGNOSIS — D124 Benign neoplasm of descending colon: Secondary | ICD-10-CM | POA: Diagnosis not present

## 2024-06-08 DIAGNOSIS — I10 Essential (primary) hypertension: Secondary | ICD-10-CM | POA: Diagnosis not present

## 2024-06-08 MED ORDER — SODIUM CHLORIDE 0.9 % IV SOLN: 500.0000 mL | Freq: Once | INTRAVENOUS | Status: AC

## 2024-06-08 NOTE — Telephone Encounter (Signed)
 Do you know anything about this?

## 2024-06-08 NOTE — Progress Notes (Signed)
 GASTROENTEROLOGY PROCEDURE H&P NOTE   Primary Care Physician: Jolinda Norene HERO, DO    Reason for Procedure:  Colon Cancer screening  Plan:    Colonoscopy  Patient is appropriate for endoscopic procedure(s) in the ambulatory ([facility]) setting.  The nature of the procedure, as well as the risks, benefits, and alternatives were carefully and thoroughly reviewed with the patient. Ample time for discussion and questions allowed. The patient understood, was satisfied, and agreed to proceed. I personally addressed all patient questions and concerns.     HPI: Marissa Perez is a 78 y.o. female who presents for colonoscopy for routine Colon Cancer screening.  No active GI symptoms.  No known family history of colon cancer or related malignancy.  Patient is otherwise without complaints or active issues today.  Endoscopic History: - 10/10/2013 colonoscopy with mild diverticulosis in the sigmoid colon otherwise normal. Repeat recommended in 10 years.  - 09/19/2020: EGD (for epigastric pain, heartburn): Mildly irregular Z-line with single small island of salmon-colored mucosa (path: Chronic inflammation without metaplasia), Gastric inlet patch, mild non-H. pylori gastritis.  Fundic gland polyps.  Nodular peptic duodenitis  Past Medical History:  Diagnosis Date   Acid reflux    Allergy    Arthritis    Cataract    Removed 09/2023, 10/2023   Constipation    Diverticulosis    GERD (gastroesophageal reflux disease)    Heart murmur    as a child only   History of ETT 01/2009   abnormal    History of stomach ulcers 2008   positive h. 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 Claudene / fibroids    TOTAL HIP ARTHROPLASTY Right 11/16/2023   Procedure: ARTHROPLASTY, HIP, TOTAL, ANTERIOR APPROACH;  Surgeon: Melodi Lerner, MD;  Location: [facility];  Service: Orthopedics;  Laterality: Right;    Prior to Admission medications   Medication Sig Start Date End Date Taking? Authorizing Provider  amLODipine  (NORVASC ) 5 MG tablet Take 1 tablet (5 mg total) by mouth daily. With losartan  for BP 04/10/24  Yes Gottschalk, Ashly M, DO  atorvastatin  (LIPITOR) 20 MG tablet Take 1 tablet (20 mg total) by mouth daily. 08/19/23  Yes Jolinda Norene M, DO  Calcium -Magnesium  (CAL/MAG PO) Take 1 capsule by mouth in the morning and at bedtime.  Calcium  & Magnesium  Mineral Complex 750 mg   Yes [provider]  carboxymethylcellulose (REFRESH PLUS) 0.5 % SOLN 1-2 drops 3 (three) times daily as needed (dry/irritated eyes.).   Yes [provider]  Cholecalciferol (VITAMIN D -3) 125 MCG (5000 UT) TABS Take 5,000 Units by mouth in the morning.   Yes [provider]  cyanocobalamin (VITAMIN B12) 1000 MCG tablet Take 10,000 mcg by mouth in the morning.   Yes [provider]  ezetimibe  (ZETIA ) 10 MG tablet Take 1 tablet (10 mg total) by mouth daily. 04/10/24  Yes Jolinda Norene  M, DO  gabapentin  (NEURONTIN ) 100 MG capsule TAKE 1 CAPSULE EVERY MORNING AND 2 CAPSULES AT BEDTIME 04/10/24  Yes Gottschalk, Ashly M, DO  Glucosamine-Chondroitin (COSAMIN DS PO) Take 1 tablet by mouth in the morning and at bedtime.   Yes [provider]  icosapent  Ethyl (VASCEPA ) 1 g capsule Take 2 capsules (2 g total) by mouth 2 (two) times daily. 04/10/24  Yes Gottschalk, Norene M, DO  levothyroxine  (SYNTHROID ) 50 MCG tablet TAKE 1/2 TABLET ON MONDAY, WEDNESDAY, AND FRIDAY, AND TAKE ONE  TABLET ON tuesday AND thursday 04/10/24  Yes Gottschalk, Ashly M, DO  losartan  (COZAAR ) 100 MG tablet Take 1 tablet (100 mg total) by mouth daily. 04/10/24  Yes Jolinda Norene M, DO  omeprazole  (PRILOSEC) 40 MG capsule Take 1 capsule (40 mg total) by mouth daily. 04/16/24  Yes Jolinda Norene M, DO  Probiotic Product (PROBIOTIC DAILY PO) Take 1 capsule by mouth at bedtime.   Yes [provider]  ZINC GLUCONATE PO Take 50 mg by mouth in the morning.   Yes [provider]  clobetasol (TEMOVATE) 0.05 % external solution Apply 1 application  topically daily as needed (scalp irritation). 10/23/13   [provider]  diclofenac  Sodium (VOLTAREN ) 1 % GEL Apply 4 g topically 4 (four) times daily as needed (pain.). Patient not taking: Reported on 06/08/2024    [provider]  meloxicam  (MOBIC ) 15 MG tablet TAKE ONE TABLET DAILY AS NEEDED FOR PAIN 06/09/23 09/26/23  Lavell Bari LABOR, FNP    Current Outpatient Medications  Medication Sig Dispense Refill   amLODipine  (NORVASC ) 5 MG tablet Take 1 tablet (5 mg total) by mouth daily. With losartan  for BP 90 tablet 3   atorvastatin  (LIPITOR) 20 MG tablet Take 1 tablet (20 mg total) by mouth daily. 90 tablet 3   Calcium -Magnesium  (CAL/MAG PO) Take 1 capsule by mouth in the morning and at bedtime.  Calcium  & Magnesium  Mineral Complex 750 mg     carboxymethylcellulose (REFRESH PLUS) 0.5 % SOLN 1-2 drops 3 (three) times daily as needed (dry/irritated eyes.).     Cholecalciferol (VITAMIN D -3) 125 MCG (5000 UT) TABS Take 5,000 Units by mouth in the morning.     cyanocobalamin (VITAMIN B12) 1000 MCG tablet Take 10,000 mcg by mouth in the morning.     ezetimibe  (ZETIA ) 10 MG tablet Take 1 tablet (10 mg total) by mouth daily. 90 tablet 3   gabapentin  (NEURONTIN ) 100 MG capsule TAKE 1 CAPSULE EVERY MORNING AND 2 CAPSULES AT BEDTIME 270 capsule 3   Glucosamine-Chondroitin (COSAMIN DS PO) Take 1 tablet by mouth in the morning and at bedtime.      icosapent  Ethyl (VASCEPA ) 1 g capsule Take 2 capsules (2 g total) by mouth 2 (two) times daily. 360 capsule 3   levothyroxine  (SYNTHROID ) 50 MCG tablet TAKE 1/2 TABLET ON MONDAY, WEDNESDAY, AND FRIDAY, AND TAKE ONE TABLET ON tuesday AND thursday 90 tablet 3   losartan  (COZAAR ) 100 MG tablet Take 1 tablet (100 mg total) by mouth daily. 90 tablet 3   omeprazole  (PRILOSEC) 40 MG capsule Take 1 capsule (40 mg total) by mouth daily. 90 capsule 4   Probiotic Product (PROBIOTIC DAILY PO) Take 1 capsule by mouth at bedtime.     ZINC GLUCONATE PO Take 50 mg by mouth in the morning.     clobetasol (TEMOVATE) 0.05 % external solution Apply 1 application  topically daily as needed (scalp irritation).     diclofenac  Sodium (VOLTAREN ) 1 % GEL  Apply 4 g topically 4 (four) times daily as needed (pain.). (Patient not taking: Reported on 06/08/2024)     Current Facility-Administered Medications  Medication Dose Route Frequency Provider Last Rate Last Admin   0.9 %  sodium chloride  infusion  500 mL Intravenous Once Samil Mecham V, DO        Allergies as of 06/08/2024 - Review Complete 06/08/2024  Allergen Reaction Noted   Codeine Other (See Comments) 12/02/2010   Femring [estradiol] Rash 12/11/2012   Cephalexin Rash 12/02/2010   Erythromycin Other (See Comments) 12/02/2010   Penicillins Rash 12/02/2010    Family History  Problem Relation Age of Onset   Breast cancer Mother    Cancer Mother    Diabetes Father    Heart disease Father    Heart attack Father 40   Arthritis Father    Breast cancer Maternal Aunt    Cancer Maternal Aunt    Breast cancer Paternal Aunt    Cancer Paternal Aunt    Cancer Paternal Uncle    Learning disabilities Paternal Uncle    Breast cancer Paternal Grandmother        Aunt also    Cancer Paternal Grandmother    Breast cancer Other        Family History   Colon cancer Neg Hx    Esophageal cancer Neg Hx    Rectal cancer Neg Hx    Stomach cancer Neg Hx    Colon  polyps Neg Hx     Social History   Socioeconomic History   Marital status: Married    Spouse name: [name]   Number of children: 1   Years of education: 16   Highest education level: Bachelor's degree (e.g., BA, AB, BS)  Occupational History   Occupation: Retired    Associate Professor: TOUR MANAGER    Comment: Teacher  Tobacco Use   Smoking status: Never   Smokeless tobacco: Never  Vaping Use   Vaping status: Never Used  Substance and Sexual Activity   Alcohol use: Yes    Alcohol/week: 7.0 standard drinks of alcohol    Types: 7 Glasses of wine per week    Comment: 1 daily   Drug use: No   Sexual activity: Yes    Birth control/protection: Post-menopausal, None  Other Topics Concern   Not on file  Social History Narrative   Lives home with her husband Ny Their son lives in [place]. One grandchild   Social Drivers of Corporate Investment Banker Strain: Low Risk  (05/11/2024)   Overall Financial Resource Strain (CARDIA)    Difficulty of Paying Living Expenses: Not hard at all  Food Insecurity: No Food Insecurity (05/15/2024)   Hunger Vital Sign    Worried About Running Out of Food in the Last Year: Never true    Ran Out of Food in the Last Year: Never true  Transportation Needs: No Transportation Needs (05/11/2024)   PRAPARE - Administrator, Civil Service (Medical): No    Lack of Transportation (Non-Medical): No  Physical Activity: Sufficiently Active (05/11/2024)   Exercise Vital Sign    Days of Exercise per Week: 6 days    Minutes of Exercise per Session: 40 min  Stress: No Stress Concern Present (05/11/2024)   [employer] of Occupational Health - Occupational Stress Questionnaire    Feeling of Stress: Not at all  Social Connections: Socially Integrated (05/11/2024)   Social Connection and Isolation Panel    Frequency of Communication with Friends  and Family: More than three times a week    Frequency of Social Gatherings with Friends and  Family: Three times a week    Attends Religious Services: More than 4 times per year    Active Member of Clubs or Organizations: Yes    Attends Banker Meetings: More than 4 times per year    Marital Status: Married  Catering Manager Violence: Patient Declined (05/15/2024)   Humiliation, Afraid, Rape, and Kick questionnaire    Fear of Current or Ex-Partner: Patient declined    Emotionally Abused: Patient declined    Physically Abused: Patient declined    Sexually Abused: Patient declined    Physical Exam: Vital signs in last 24 hours: @BP  (!) 143/74   Pulse (!) 54   Temp (!) 97.2 F (36.2 C) (Temporal)   Resp 16   Ht 5' 1 (1.549 m)   Wt 135 lb (61.2 kg)   SpO2 100%   BMI 25.51 kg/m  GEN: NAD EYE: Sclerae anicteric ENT: MMM CV: Non-tachycardic Pulm: CTA b/l GI: Soft, NT/ND NEURO:  Alert & Oriented x 3   Sandor Flatter, DO [facility]   06/08/2024 11:40 AM

## 2024-06-08 NOTE — Progress Notes (Signed)
 Called to room to assist during endoscopic procedure.  Patient ID and intended procedure confirmed with present staff. Received instructions for my participation in the procedure from the performing physician.

## 2024-06-08 NOTE — Op Note (Signed)
 [facility] Patient Name: Marissa Perez Procedure Date: 06/08/2024 11:32 AM MRN: [id number] Endoscopist: Sandor Flatter , MD, [id number] Age: 78 Referring MD:  Date of Birth: April 24, 1946 Gender: Female Account #: [id number] Procedure:                Colonoscopy Indications:              Screening for colorectal malignant neoplasm (last                            colonoscopy was 10 years ago) Medicines:                Monitored Anesthesia Care Procedure:                Pre-Anesthesia Assessment:                           - Prior to the procedure, a History and Physical                            was performed, and patient medications and                            allergies were reviewed. The patient's tolerance of                            previous anesthesia was also reviewed. The risks                            and benefits of the procedure and the sedation                            options and risks were discussed with the patient.                            All questions were answered, and informed consent                            was obtained. Prior Anticoagulants: The patient has                            taken no anticoagulant or antiplatelet agents. ASA                            Grade Assessment: II - A patient with mild systemic                            disease. After reviewing the risks and benefits,                            the patient was deemed in satisfactory condition to                            undergo the procedure.  After obtaining informed consent, the colonoscope                            was passed under direct vision. Throughout the                            procedure, the patient's blood pressure, pulse, and                            oxygen saturations were monitored continuously. The                            Olympus Scope SN [id number] was introduced through the                            anus and advanced to the  the terminal ileum. The                            colonoscopy was performed without difficulty. The                            patient tolerated the procedure well. The quality                            of the bowel preparation was good. The terminal                            ileum, ileocecal valve, appendiceal orifice, and                            rectum were photographed. Scope In: 11:45:34 AM Scope Out: 11:57:05 AM Scope Withdrawal Time: 0 hours 8 minutes 10 seconds  Total Procedure Duration: 0 hours 11 minutes 31 seconds  Findings:                 The perianal and digital rectal examinations were                            normal.                           A 3 mm polyp was found in the descending colon. The                            polyp was sessile. The polyp was removed with a                            cold snare. Resection and retrieval were complete.                            Estimated blood loss was minimal.                           The exam was otherwise normal throughout the  remainder of the colon.                           Non-bleeding internal hemorrhoids were found during                            retroflexion. The hemorrhoids were small and Grade                            I (internal hemorrhoids that do not prolapse).                           The terminal ileum appeared normal. Complications:            No immediate complications. Estimated Blood Loss:     Estimated blood loss was minimal. Impression:               - One 3 mm polyp in the descending colon, removed                            with a cold snare. Resected and retrieved.                           - Non-bleeding internal hemorrhoids.                           - The examined portion of the ileum was normal. Recommendation:           - Patient has a contact number available for                            emergencies. The signs and symptoms of potential                             delayed complications were discussed with the                            patient. Return to normal activities tomorrow.                            Written discharge instructions were provided to the                            patient.                           - Resume previous diet.                           - Continue present medications.                           - Await pathology results.                           - Will follow-up on pathology results, but if the  polyp resected today is a hyperplastic polyp or                            benign lymphoid aggregate, no repeat colonoscopy                            recommended for screening purposes.                           - Return to GI office PRN. Sandor Flatter, MD 06/08/2024 12:03:14 PM

## 2024-06-08 NOTE — Progress Notes (Signed)
 Pt's states no medical or surgical changes since previsit or office visit.

## 2024-06-08 NOTE — Progress Notes (Signed)
 Report given to PACU, vss

## 2024-06-08 NOTE — Patient Instructions (Addendum)

## 2024-06-11 ENCOUNTER — Telehealth: Payer: Self-pay

## 2024-06-11 ENCOUNTER — Ambulatory Visit
Admission: RE | Admit: 2024-06-11 | Discharge: 2024-06-11 | Disposition: A | Payer: Medicare PPO | Source: Ambulatory Visit | Attending: Family Medicine

## 2024-06-11 DIAGNOSIS — Z1231 Encounter for screening mammogram for malignant neoplasm of breast: Secondary | ICD-10-CM

## 2024-06-11 NOTE — Telephone Encounter (Signed)
  Follow up Call-     06/08/2024   10:31 AM  Call back number  Post procedure Call Back phone  # [phone]  Permission to leave phone message Yes     Patient questions:  Do you have a fever, pain , or abdominal swelling? No. Pain Score  0 *  Have you tolerated food without any problems? Yes.    Have you been able to return to your normal activities? Yes.    Do you have any questions about your discharge instructions: Diet   No. Medications  No. Follow up visit  No.  Do you have questions or concerns about your Care? No.  Actions: * If pain score is 4 or above: No action needed, pain <4.

## 2024-06-12 LAB — SURGICAL PATHOLOGY

## 2024-06-12 NOTE — Telephone Encounter (Signed)
 We cannot cancel a PA. It looks like [employer] automatically extended the approval on file. As long as office does not bill insurance, there will be no charge to the insurance. Authorization will just be on file.

## 2024-06-12 NOTE — Telephone Encounter (Signed)
 Sent patient Mychart message.

## 2024-06-12 NOTE — Telephone Encounter (Signed)
 Can we cancel out the PA for this patient?

## 2024-06-20 ENCOUNTER — Ambulatory Visit: Payer: Self-pay

## 2024-06-20 ENCOUNTER — Telehealth: Payer: Self-pay | Admitting: Family Medicine

## 2024-06-20 ENCOUNTER — Ambulatory Visit: Payer: Self-pay | Admitting: Gastroenterology

## 2024-06-20 NOTE — Telephone Encounter (Signed)
    FYI Only or Action Required?: FYI only for provider: appointment scheduled on 06/25/2024.  Patient was last seen in primary care on 04/10/2024 by Jolinda Norene HERO, DO.  Called Nurse Triage reporting Dysuria.  Symptoms began x 2 weeks.  Interventions attempted: Nothing.  Symptoms are: gradually worsening.  Triage Disposition: See PCP Within 2 Weeks  Patient/caregiver understands and will follow disposition?: Yes Copied from CRM [id number]. Topic: Clinical - Red Word Triage >> Jun 20, 2024 11:39 AM Roselie BROCKS wrote: Red Word that prompted transfer to Nurse Triage: Patient states she feels she has a UTI or yeast infection, a lot of burning, itching, pain, and swelling in feet and ankles. Reason for Disposition  [1] MILD pain (e.g., does not interfere with normal activities) AND [2] present > 7 days  Answer Assessment - Initial Assessment Questions 1. SYMPTOM: What's the main symptom you're concerned about? (e.g., frequency, incontinence)     Painful urination, burning,  2. ONSET: When did the    start?     X 2 weeks  3. PAIN: Is there any pain? If Yes, ask: How bad is it? (Scale: 1-10; mild, moderate, severe)     Mild to moderate  4. CAUSE: What do you think is causing the symptoms?     UTI and possible yeast infection 5. OTHER SYMPTOMS: Do you have any other symptoms? (e.g., blood in urine, fever, flank pain, pain with urination)     Itching,  6. PREGNANCY: Is there any chance you are pregnant? When was your last menstrual period?     no  Answer Assessment - Initial Assessment Questions 1. ONSET: When did the pain start?      Couple weeks and ongoing 2. LOCATION: Where is the pain located?      Bilateral feet and ankles 3. PAIN: How bad is the pain?    (Scale 1-10; or mild, moderate, severe)     mild 4. WORK OR EXERCISE: Has there been any recent work or exercise that involved this part of the body?      na 5. CAUSE: What do you think is  causing the leg pain?     unknown 6. OTHER SYMPTOMS: Do you have any other symptoms? (e.g., chest pain, back pain, breathing difficulty, swelling, rash, fever, numbness, weakness)     no 7. PREGNANCY: Is there any chance you are pregnant? When was your last menstrual period?     na  Had lymphatic drainage, redness this morning  Protocols used: Urinary Symptoms-A-AH, Leg Pain-A-AH

## 2024-06-20 NOTE — Telephone Encounter (Signed)
 [place] She is scheduled to see Bari on Monday November 24th

## 2024-06-20 NOTE — Telephone Encounter (Signed)
 Call disconnected do to system issues I will need to reboot

## 2024-06-20 NOTE — Telephone Encounter (Unsigned)
 Copied from CRM [id number]. Topic: Clinical - Refused Triage >> Jun 20, 2024  1:33 PM Carrielelia G wrote: Patient is having vaginal issues but has  Declined being transfer to triage. Patient stated she just wants an appointment for Monday. She is currently out of town

## 2024-06-22 ENCOUNTER — Encounter: Payer: Self-pay | Admitting: Family Medicine

## 2024-06-22 ENCOUNTER — Ambulatory Visit: Admitting: Family Medicine

## 2024-06-22 VITALS — BP 134/60 | HR 77 | Temp 97.7°F | Ht 61.0 in | Wt 143.4 lb

## 2024-06-22 DIAGNOSIS — R600 Localized edema: Secondary | ICD-10-CM

## 2024-06-22 DIAGNOSIS — N760 Acute vaginitis: Secondary | ICD-10-CM | POA: Diagnosis not present

## 2024-06-22 LAB — URINALYSIS, ROUTINE W REFLEX MICROSCOPIC
Bilirubin, UA: NEGATIVE
Glucose, UA: NEGATIVE
Ketones, UA: NEGATIVE
Nitrite, UA: NEGATIVE
Protein,UA: NEGATIVE
RBC, UA: NEGATIVE
Specific Gravity, UA: 1.015 (ref 1.005–1.030)
Urobilinogen, Ur: 0.2 mg/dL (ref 0.2–1.0)
pH, UA: 6.5 (ref 5.0–7.5)

## 2024-06-22 LAB — MICROSCOPIC EXAMINATION
Bacteria, UA: NONE SEEN
RBC, Urine: NONE SEEN /HPF (ref 0–2)

## 2024-06-22 LAB — WET PREP FOR TRICH, YEAST, CLUE
Clue Cell Exam: NEGATIVE
Trichomonas Exam: NEGATIVE
Yeast Exam: NEGATIVE

## 2024-06-22 MED ORDER — FUROSEMIDE 20 MG PO TABS: 20.0000 mg | ORAL_TABLET | Freq: Every day | ORAL | 3 refills | Status: AC | PRN

## 2024-06-22 MED ORDER — METRONIDAZOLE 0.75 % VA GEL
1.0000 | Freq: Every day | VAGINAL | 0 refills | Status: AC
Start: 2024-06-22 — End: 2024-06-29

## 2024-06-22 MED ORDER — FLUCONAZOLE 150 MG PO TABS: 150.0000 mg | ORAL_TABLET | Freq: Once | ORAL | 0 refills | Status: AC

## 2024-06-22 NOTE — Patient Instructions (Signed)
 Irritation of the Vagina (Vaginitis): What to Know  Vaginitis is irritation and swelling of the vagina. It happens when the usual balance of bacteria and yeast in the vagina changes. This change causes some types to grow too much. This overgrowth leads to vaginitis. What are the causes? Bacteria. Yeast, which is a fungus. A parasite. A virus. Low hormones in the body. This can occur during pregnancy, breastfeeding, or after menopause. What increases the risk? Irritants, such as douches, bubble baths, scented tampons, and feminine sprays. Antibiotics. Poor hygiene. Wearing tight pants or thong underwear. Some birth control methods, such as diaphragms, vaginal sponges, or spermicides. Having sex without a condom or having sex with more than one person. Infections. Uncontrolled diabetes. What are the signs or symptoms? Abnormal fluid from the vagina. The fluid may be: White, gray, or yellow. Thick, white, and cheesy. Frothy and yellow or green. A bad smell from the vagina. Itching, pain, or swelling in the vagina. Pain during sex. Pain or burning when you pee. How is this diagnosed? This condition is diagnosed based on your symptoms, medical history, and an exam. This may include a pelvic exam. Tests may also be done. Tests may be done to: Check the pH level of your vagina. Check the fluid in your vagina. How is this treated? Treatment will depend on what is causing your vaginitis. Treatment may include: Antibiotics. Antifungal medicines. Medicines to treat symptoms if you have a virus. Your sex partner should also be treated. Estrogen medicines. Medicines to treat allergies. The medicines may be pills or creams. Follow these instructions at home: Lifestyle Keep the area around your vagina clean and dry. Avoid using soap. Rinse the area with water. Until your health care provider says it's okay: Do not douche. Do not use tampons. Use pads, if needed. Do not have sex. Wipe  from front to back after going to the bathroom. When the provider says it's okay, practice safe sex. Use condoms. General instructions Take your medicines only as told. If you were given antibiotics, take them as told. Do not stop taking them even if you start to feel better. How is this prevented? Use mild, unscented products. Avoid the following products if they are scented: Sprays. Detergents. Tampons. Products for cleaning the vagina. Soaps or bubble baths. Let air reach your genital area. To do this: Wear cotton underwear. Do not wear underwear while you sleep. Do not wear tight pants and underwear or pantyhose without a cotton panel. Do not wear thong underwear. Take off any wet clothing, such as bathing suits, as soon as possible. Practice safe sex. Use condoms. Contact a health care provider if: You have pain in the belly or around the pelvis. You have a fever or chills. You have symptoms that last for more than 2-3 days. This information is not intended to replace advice given to you by your health care provider. Make sure you discuss any questions you have with your health care provider. Document Revised: 04/21/2023 Document Reviewed: 11/29/2022 Elsevier Patient Education  2024 [employer].

## 2024-06-22 NOTE — Progress Notes (Signed)
 Subjective: CC: Vaginal irritation PCP: Jolinda Norene HERO, DO Marissa Perez is a 78 y.o. female presenting to clinic today for:  Patient reports that she has had about a 2-week history of vaginal itching and irritation that has gotten worse over the last several days.  She notes that she has some burning when she urinates.  No hematuria.  No fevers or flank pain reported.  She has been utilizing Gynecort but this caused quite a bit of vaginal burning and so she discontinued.  She has also used vaginal sill wipes with no improvement.  She does report loose stools preceding symptoms for colonoscopy cleanout.  Not sure if this may have caused some of the symptoms she is experiencing.  Denies any vaginal discharge or bleeding  She also reports slight worsening of her pedal edema.  She does admit that she recently just came back from the beach.  No shortness of breath or chest pain reported.   ROS: Per HPI  Allergies  Allergen Reactions   Codeine Other (See Comments)    Headache and feeling awful    Femring [Estradiol] Rash   Cephalexin Rash   Erythromycin Other (See Comments)    Gi  Upset     Penicillins Rash   Past Medical History:  Diagnosis Date   Acid reflux    Allergy    Arthritis    Cataract    Removed 09/2023, 10/2023   Constipation    Diverticulosis    GERD (gastroesophageal reflux disease)    Heart murmur    as a child only   History of ETT 01/2009   abnormal    History of stomach ulcers 2008   positive h. 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 With losartan  for BP, Disp: 90 tablet, Rfl: 3   atorvastatin  (LIPITOR) 20 MG tablet, Take 1 tablet (20 mg total) by mouth daily., Disp: 90  tablet, Rfl: 3   Calcium -Magnesium  (CAL/MAG PO), Take 1 capsule by mouth in the morning and at bedtime.  Calcium  & Magnesium  Mineral Complex 750 mg, Disp: , Rfl:    carboxymethylcellulose (REFRESH PLUS) 0.5 % SOLN, 1-2 drops 3 (three) times daily as needed (dry/irritated eyes.)., Disp: , Rfl:    Cholecalciferol (VITAMIN D -3) 125 MCG (5000 UT) TABS, Take 5,000 Units by mouth in the morning., Disp: , Rfl:    clobetasol (TEMOVATE) 0.05 % external solution, Apply 1 application  topically daily as needed (scalp irritation)., Disp: , Rfl:    cyanocobalamin (VITAMIN B12) 1000 MCG tablet, Take 10,000 mcg by mouth in the morning., Disp: , Rfl:    diclofenac  Sodium (VOLTAREN ) 1 % GEL, Apply 4 g topically 4 (four) times daily as needed (pain.)., Disp: , Rfl:    ezetimibe  (ZETIA ) 10 MG tablet, Take 1 tablet (10 mg total) by mouth daily., Disp: 90 tablet, Rfl: 3   fluconazole  (DIFLUCAN ) 150 MG tablet, Take 1 tablet (150 mg total) by mouth once for 1 dose., Disp: 1 tablet, Rfl: 0   furosemide  (LASIX ) 20 MG tablet, Take 1 tablet (20 mg total) by mouth daily as needed for edema or fluid., Disp: 30 tablet, Rfl: 3  gabapentin  (NEURONTIN ) 100 MG capsule, TAKE 1 CAPSULE EVERY MORNING AND 2 CAPSULES AT BEDTIME, Disp: 270 capsule, Rfl: 3   Glucosamine-Chondroitin (COSAMIN DS PO), Take 1 tablet by mouth in the morning and at bedtime., Disp: , Rfl:    icosapent  Ethyl (VASCEPA ) 1 g capsule, Take 2 capsules (2 g total) by mouth 2 (two) times daily., Disp: 360 capsule, Rfl: 3   levothyroxine  (SYNTHROID ) 50 MCG tablet, TAKE 1/2 TABLET ON MONDAY, WEDNESDAY, AND FRIDAY, AND TAKE ONE TABLET ON tuesday AND thursday, Disp: 90 tablet, Rfl: 3   losartan  (COZAAR ) 100 MG tablet, Take 1 tablet (100 mg total) by mouth daily., Disp: 90 tablet, Rfl: 3   metroNIDAZOLE  (METROGEL ) 0.75 % vaginal gel, Place 1 Applicatorful vaginally at bedtime for 7 days., Disp: 70 g, Rfl: 0   omeprazole  (PRILOSEC) 40 MG capsule, Take 1 capsule (40 mg total)  by mouth daily., Disp: 90 capsule, Rfl: 4   Probiotic Product (PROBIOTIC DAILY PO), Take 1 capsule by mouth at bedtime., Disp: , Rfl:    ZINC GLUCONATE PO, Take 50 mg by mouth in the morning., Disp: , Rfl:   Current Facility-Administered Medications:    0.9 %  sodium chloride  infusion, 500 mL, Intravenous, Once, Cirigliano, Vito V, DO Social History   Socioeconomic History   Marital status: Married    Spouse name: Marissa Perez   Number of children: 1   Years of education: 16   Highest education level: Bachelor's degree (e.g., BA, AB, BS)  Occupational History   Occupation: Retired    Associate Professor: TOUR MANAGER    Comment: Teacher  Tobacco Use   Smoking status: Never   Smokeless tobacco: Never  Vaping Use   Vaping status: Never Used  Substance and Sexual Activity   Alcohol use: Yes    Alcohol/week: 7.0 standard drinks of alcohol    Types: 7 Glasses of wine per week    Comment: 1 daily   Drug use: No   Sexual activity: Yes    Birth control/protection: Post-menopausal, None  Other Topics Concern   Not on file  Social History Narrative   Lives home with her husband Perez Their son lives in [place]. One grandchild   Social Drivers of Corporate Investment Banker Strain: Low Risk  (05/11/2024)   Overall Financial Resource Strain (CARDIA)    Difficulty of Paying Living Expenses: Not hard at all  Food Insecurity: No Food Insecurity (05/15/2024)   Hunger Vital Sign    Worried About Running Out of Food in the Last Year: Never true    Ran Out of Food in the Last Year: Never true  Transportation Needs: No Transportation Needs (05/11/2024)   PRAPARE - Administrator, Civil Service (Medical): No    Lack of Transportation (Non-Medical): No  Physical Activity: Sufficiently Active (05/11/2024)   Exercise Vital Sign    Days of Exercise per Week: 6 days    Minutes of Exercise per Session: 40 min  Stress: No Stress Concern Present (05/11/2024)   [employer] of  Occupational Health - Occupational Stress Questionnaire    Feeling of Stress: Not at all  Social Connections: Socially Integrated (05/11/2024)   Social Connection and Isolation Panel    Frequency of Communication with Friends and Family: More than three times a week    Frequency of Social Gatherings with Friends and Family: Three times a week    Attends Religious Services: More than 4 times per year    Active Member of Clubs  or Organizations: Yes    Attends Banker Meetings: More than 4 times per year    Marital Status: Married  Intimate Partner Violence: Patient Declined (05/15/2024)   Humiliation, Afraid, Rape, and Kick questionnaire    Fear of Current or Ex-Partner: Patient declined    Emotionally Abused: Patient declined    Physically Abused: Patient declined    Sexually Abused: Patient declined   Family History  Problem Relation Age of Onset   Breast cancer Mother    Cancer Mother    Diabetes Father    Heart disease Father    Heart attack Father 26   Arthritis Father    Breast cancer Maternal Aunt    Cancer Maternal Aunt    Breast cancer Paternal Aunt    Cancer Paternal Aunt    Cancer Paternal Uncle    Learning disabilities Paternal Uncle    Breast cancer Paternal Grandmother        Aunt also    Cancer Paternal Grandmother    Breast cancer Other        Family History   Colon cancer Neg Hx    Esophageal cancer Neg Hx    Rectal cancer Neg Hx    Stomach cancer Neg Hx    Colon polyps Neg Hx     Objective: Office vital signs reviewed. BP 134/60   Pulse 77   Temp 97.7 F (36.5 C)   Ht 5' 1 (1.549 m)   Wt 143 lb 6 oz (65 kg)   SpO2 99%   BMI 27.09 kg/m   Physical Examination:  General: Awake, alert, well nourished, No acute distress HEENT: sclera white, MMM Cardio: regular rate and rhythm  Pulm: normal work of breathing on room air GU: external vaginal tissue atrophic and hyperemic.  She has an area of excoriation along the left lower labia  majora on the mucosal aspect of the labia.  No adhesions or lichenification appreciated.  No vaginal discharge or bleeding Extremities: No gross pedal edema appreciated on exam today  Assessment/ Plan: 78 y.o. female   Acute vaginitis - Plan: Urinalysis, Routine w reflex microscopic, Urine Culture, WET PREP FOR TRICH, YEAST, CLUE, metroNIDAZOLE  (METROGEL ) 0.75 % vaginal gel, fluconazole  (DIFLUCAN ) 150 MG tablet  Pedal edema - Plan: furosemide  (LASIX ) 20 MG tablet   Uncertain if this is may be a contact vaginitis due to what ever may have been used during her colonoscopy procedure.  Her urinalysis demonstrated no evidence of infection.  Wet prep demonstrated no bacterial vaginosis nor yeast but it did show a lot of white blood cells and some bacteria.  Am going to empirically treat her with a yeast pill and MetroGel .  Nothing on exam to suggest lichen sclerosis of the vulva.  It is possible though that she had irritation secondary to utilization of a cream that was too old.  So we may consider revisiting topical corticosteroid if needed.  Discussed use of cold compresses in the vaginal area  Lasix  also given for as needed use for pedal edema though this was not visualized on exam today   Norene CHRISTELLA Fielding, DO [facility] [phone]

## 2024-06-24 LAB — URINE CULTURE

## 2024-06-25 ENCOUNTER — Ambulatory Visit: Admitting: Family

## 2024-06-25 ENCOUNTER — Encounter: Payer: Self-pay | Admitting: Family Medicine

## 2024-06-25 ENCOUNTER — Ambulatory Visit: Payer: Self-pay | Admitting: Family Medicine

## 2024-08-29 ENCOUNTER — Other Ambulatory Visit: Payer: Self-pay | Admitting: Family Medicine

## 2024-08-29 DIAGNOSIS — E782 Mixed hyperlipidemia: Secondary | ICD-10-CM

## 2024-08-29 DIAGNOSIS — I700 Atherosclerosis of aorta: Secondary | ICD-10-CM

## 2024-09-03 ENCOUNTER — Other Ambulatory Visit: Payer: Self-pay | Admitting: Family Medicine

## 2024-09-03 DIAGNOSIS — I10 Essential (primary) hypertension: Secondary | ICD-10-CM

## 2024-09-04 ENCOUNTER — Other Ambulatory Visit: Payer: Self-pay | Admitting: Family Medicine

## 2024-09-04 DIAGNOSIS — I700 Atherosclerosis of aorta: Secondary | ICD-10-CM

## 2024-09-04 DIAGNOSIS — E782 Mixed hyperlipidemia: Secondary | ICD-10-CM

## 2024-10-08 ENCOUNTER — Other Ambulatory Visit

## 2024-10-09 ENCOUNTER — Other Ambulatory Visit: Payer: Self-pay

## 2024-10-10 ENCOUNTER — Other Ambulatory Visit: Payer: Self-pay

## 2024-10-10 ENCOUNTER — Ambulatory Visit: Payer: Self-pay | Admitting: Family Medicine

## 2025-05-16 ENCOUNTER — Ambulatory Visit: Payer: Self-pay
